# Patient Record
Sex: Male | Born: 1965 | Race: White | Hispanic: No | Marital: Married | Smoking: Never smoker
Health system: Southern US, Community
[De-identification: ages and names within clinical notes are randomized; demographics above are authoritative.]

## PROBLEM LIST (undated history)

## (undated) DIAGNOSIS — J189 Pneumonia, unspecified organism: Secondary | ICD-10-CM

## (undated) DIAGNOSIS — R51 Headache: Secondary | ICD-10-CM

## (undated) DIAGNOSIS — E785 Hyperlipidemia, unspecified: Secondary | ICD-10-CM

## (undated) DIAGNOSIS — R0602 Shortness of breath: Secondary | ICD-10-CM

## (undated) DIAGNOSIS — J302 Other seasonal allergic rhinitis: Secondary | ICD-10-CM

## (undated) DIAGNOSIS — J329 Chronic sinusitis, unspecified: Secondary | ICD-10-CM

## (undated) DIAGNOSIS — I499 Cardiac arrhythmia, unspecified: Secondary | ICD-10-CM

## (undated) DIAGNOSIS — I2510 Atherosclerotic heart disease of native coronary artery without angina pectoris: Secondary | ICD-10-CM

## (undated) DIAGNOSIS — S83206A Unspecified tear of unspecified meniscus, current injury, right knee, initial encounter: Secondary | ICD-10-CM

## (undated) DIAGNOSIS — N200 Calculus of kidney: Secondary | ICD-10-CM

## (undated) DIAGNOSIS — Z9889 Other specified postprocedural states: Secondary | ICD-10-CM

## (undated) DIAGNOSIS — M1990 Unspecified osteoarthritis, unspecified site: Secondary | ICD-10-CM

## (undated) DIAGNOSIS — N189 Chronic kidney disease, unspecified: Secondary | ICD-10-CM

## (undated) DIAGNOSIS — I10 Essential (primary) hypertension: Secondary | ICD-10-CM

## (undated) DIAGNOSIS — Z87442 Personal history of urinary calculi: Secondary | ICD-10-CM

## (undated) DIAGNOSIS — G4733 Obstructive sleep apnea (adult) (pediatric): Secondary | ICD-10-CM

## (undated) DIAGNOSIS — Z8679 Personal history of other diseases of the circulatory system: Secondary | ICD-10-CM

## (undated) DIAGNOSIS — Z7901 Long term (current) use of anticoagulants: Secondary | ICD-10-CM

## (undated) DIAGNOSIS — I4891 Unspecified atrial fibrillation: Secondary | ICD-10-CM

## (undated) HISTORY — PX: EXTRACORPOREAL SHOCK WAVE LITHOTRIPSY: SHX1557

## (undated) HISTORY — DX: Unspecified atrial fibrillation: I48.91

## (undated) HISTORY — DX: Chronic sinusitis, unspecified: J32.9

## (undated) HISTORY — DX: Personal history of other diseases of the circulatory system: Z98.890

## (undated) HISTORY — PX: OTHER SURGICAL HISTORY: SHX169

## (undated) HISTORY — PX: RHINOPLASTY: SUR1284

## (undated) HISTORY — DX: Essential (primary) hypertension: I10

## (undated) HISTORY — DX: Hyperlipidemia, unspecified: E78.5

## (undated) HISTORY — PX: KNEE ARTHROSCOPY: SUR90

## (undated) HISTORY — PX: ROBOT ASSISTED INGUINAL HERNIA REPAIR: SHX6561

## (undated) HISTORY — PX: CARDIOVERSION: SHX1299

## (undated) HISTORY — PX: LITHOTRIPSY: SUR834

## (undated) HISTORY — DX: Headache: R51

---
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
## 2004-02-02 ENCOUNTER — Ambulatory Visit: Payer: Self-pay | Admitting: Specialist

## 2004-04-22 ENCOUNTER — Emergency Department: Payer: Self-pay | Admitting: Internal Medicine

## 2004-04-29 HISTORY — PX: RHINOPLASTY: SUR1284

## 2004-07-02 ENCOUNTER — Ambulatory Visit: Payer: Self-pay | Admitting: Family Medicine

## 2004-07-03 ENCOUNTER — Ambulatory Visit: Payer: Self-pay | Admitting: *Deleted

## 2005-04-26 ENCOUNTER — Ambulatory Visit: Payer: Self-pay | Admitting: Otolaryngology

## 2007-04-30 HISTORY — PX: KNEE SURGERY: SHX244

## 2009-04-29 DIAGNOSIS — I480 Paroxysmal atrial fibrillation: Secondary | ICD-10-CM

## 2009-04-29 HISTORY — DX: Paroxysmal atrial fibrillation: I48.0

## 2009-09-21 ENCOUNTER — Ambulatory Visit: Payer: Self-pay | Admitting: Otolaryngology

## 2011-05-16 ENCOUNTER — Ambulatory Visit: Payer: Self-pay | Admitting: Urology

## 2011-05-27 ENCOUNTER — Ambulatory Visit: Payer: Self-pay | Admitting: Urology

## 2011-05-30 ENCOUNTER — Ambulatory Visit: Payer: Self-pay | Admitting: Urology

## 2011-06-14 ENCOUNTER — Ambulatory Visit: Payer: Self-pay | Admitting: Urology

## 2011-09-18 ENCOUNTER — Ambulatory Visit: Payer: Self-pay | Admitting: Urology

## 2011-10-24 ENCOUNTER — Ambulatory Visit: Payer: Self-pay | Admitting: Urology

## 2011-11-06 ENCOUNTER — Ambulatory Visit: Payer: Self-pay | Admitting: Urology

## 2013-05-20 ENCOUNTER — Ambulatory Visit: Payer: Self-pay | Admitting: Surgery

## 2013-05-28 ENCOUNTER — Ambulatory Visit: Payer: Self-pay | Admitting: Surgery

## 2013-05-28 HISTORY — PX: HERNIA REPAIR: SHX51

## 2013-06-03 ENCOUNTER — Ambulatory Visit (INDEPENDENT_AMBULATORY_CARE_PROVIDER_SITE_OTHER): Payer: BC Managed Care – PPO | Admitting: Internal Medicine

## 2013-06-03 ENCOUNTER — Encounter: Payer: Self-pay | Admitting: Internal Medicine

## 2013-06-03 VITALS — BP 128/88 | HR 89 | Ht 70.0 in | Wt 191.8 lb

## 2013-06-03 DIAGNOSIS — I4891 Unspecified atrial fibrillation: Secondary | ICD-10-CM

## 2013-06-03 MED ORDER — DRONEDARONE HCL 400 MG PO TABS: 400.0000 mg | ORAL_TABLET | Freq: Two times a day (BID) | ORAL | Status: DC

## 2013-06-03 NOTE — Patient Instructions (Signed)
Your physician recommends that you schedule a follow-up appointment in: 3 months with Dr Rayann Heman  Your physician has requested that you have an echocardiogram. Echocardiography is a painless test that uses sound waves to create images of your heart. It provides your doctor with information about the size and shape of your heart and how well your heart's chambers and valves are working. This procedure takes approximately one hour. There are no restrictions for this procedure.  Call Leonia Reader [phone] if you decide to proceed with ablation   Your physician has recommended you make the following change in your medication:  1) Stop Flecainide 2) Sunday Start Multaq 400mg  twice daily with food

## 2013-06-11 ENCOUNTER — Ambulatory Visit (HOSPITAL_COMMUNITY): Payer: BC Managed Care – PPO | Attending: Internal Medicine | Admitting: Cardiology

## 2013-06-11 DIAGNOSIS — I4891 Unspecified atrial fibrillation: Secondary | ICD-10-CM

## 2013-06-11 DIAGNOSIS — I359 Nonrheumatic aortic valve disorder, unspecified: Secondary | ICD-10-CM | POA: Insufficient documentation

## 2013-06-11 DIAGNOSIS — I059 Rheumatic mitral valve disease, unspecified: Secondary | ICD-10-CM | POA: Insufficient documentation

## 2013-06-11 NOTE — Progress Notes (Signed)
Echo performed. 

## 2013-06-13 ENCOUNTER — Encounter: Payer: Self-pay | Admitting: Internal Medicine

## 2013-06-13 DIAGNOSIS — I4891 Unspecified atrial fibrillation: Secondary | ICD-10-CM | POA: Insufficient documentation

## 2013-06-13 NOTE — Progress Notes (Signed)
Primary Care Physician: [name], MD Referring Physician:  Self referred   Jorge [name] is a 48 y.o. male with a h/o paroxsymal atrial fibrillation who presents today for EP consultation.  He reports that he was initially diagnosed with atrial fibrillation in 2011.  He was followed at [facility] by Dr Willis Modena. He underwent cardioversion at that time.  He was placed on metoprolol but did not tolerate this medicine due to SOB and fatigue.  He was subsequently placed on flecainide.  He did very well for several years.  He also reports that with alcohol avoidance that his afib was better controlled.  Recently, he has had increasing frequency and duration of his atrial fibrillation.  Presently, he has afib about once per week, lasting up to 24 hours at a time.  He reprots fatigue and palpitations with this.  Today, he denies symptoms of palpitations, chest pain, shortness of breath, orthopnea, PND, lower extremity edema, dizziness, presyncope, syncope, or neurologic sequela. The patient is tolerating medications without difficulties and is otherwise without complaint today.   Past Medical History  Diagnosis Date  . Atrial fibrillation     Hx of DCCV. Maintained on Flecainide  . Hyperlipidemia   . Hypertension   . Sinusitis   . Headache    Past Surgical History  Procedure Laterality Date  . Cardioversion      Hx of  . Appendectomy  1975  . Rhinoplasty  2006    Dr. Nadeen Landau, [facility]  . Knee surgery  2009    [facility]    Current Outpatient Prescriptions  Medication Sig Dispense Refill  . aspirin 81 MG tablet Take 81 mg by mouth daily.      Marland Kitchen atorvastatin (LIPITOR) 10 MG tablet Take 10 mg by mouth daily.      . cetirizine (ZYRTEC) 10 MG tablet Take 10 mg by mouth as needed for allergies.      . NON FORMULARY Citrical - Take 1 tablet at bedtime      . dronedarone (MULTAQ) 400 MG tablet Take 1 tablet (400 mg total) by mouth 2 (two) times daily with a meal.  60  tablet  11   No current facility-administered medications for this visit.    No Known Allergies  History   Social History  . Marital Status: Married    Spouse Name: N/A    Number of Children: N/A  . Years of Education: N/A   Occupational History  . Not on file.   Social History Main Topics  . Smoking status: Never Smoker   . Smokeless tobacco: Not on file  . Alcohol Use: 3.0 oz/week    6 drink(s) per week  . Drug Use: No  . Sexual Activity: Not on file   Other Topics Concern  . Not on file   Social History Narrative   Lives in [place] with his spouse.  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[employer]    Family History  Problem Relation Age of Onset  . Colon polyps      Fam Hx  . Cancer Mother     breast, cause of death  . Heart attack Maternal Grandfather 53    Cause of death  . Hypertension Maternal Grandfather   . Heart failure Brother   . CVA Paternal Grandmother     cause of death  . Atrial fibrillation Maternal Grandmother     ROS- All systems are reviewed and negative except as per the HPI  above  Physical Exam: Filed Vitals:   06/03/13 1356  BP: 128/88  Pulse: 89  Height: 5\' 10"  (1.778 m)  Weight: 191 lb 12.8 oz (87 kg)    GEN- The patient is well appearing, alert and oriented x 3 today.   Head- normocephalic, atraumatic Eyes-  Sclera clear, conjunctiva pink Ears- hearing intact Oropharynx- clear Neck- supple, no JVP Lymph- no cervical lymphadenopathy Lungs- Clear to ausculation bilaterally, normal work of breathing Heart- Regular rate and rhythm, no murmurs, rubs or gallops, PMI not laterally displaced GI- soft, NT, ND, + BS Extremities- no clubbing, cyanosis, or edema MS- no significant deformity or atrophy Skin- no rash or lesion Psych- euthymic mood, full affect Neuro- strength and sensation are intact  EKG today reveals sinus rhthm 89 bpm, PR 212, septal infarct, QTc 455 More than 10 pages of records from [facility] are  reviewed  Assessment and Plan:  1. Paroxysmal atrial fibrillation The patient has symptomatic recurrent afib.  He has failed medical therapy with metoprolol and flecainide. Therapeutic strategies for afib including medicine and ablation were discussed in detail with the patient today. Risk, benefits, and alternatives to EP study and radiofrequency ablation for afib were also discussed in detail today. These risks include but are not limited to stroke, bleeding, vascular damage, tamponade, perforation, damage to the esophagus, lungs, and other structures, pulmonary vein stenosis, worsening renal function, and death. The patient understands these risk and wishes to further contemplate this option.  In the interim, he would like to try a different AAD.  Multaq, propafenone, and tikosyn were presented as options.  He would like to try multaq.  Risks, benefits, and alternatives to this medicine were discussed with the patient.  He is encouraged to take it with food. We will stop flecainide and start multaq after a 72 hour washout. He will contact our office if he decides to proceed with ablation.  We will obtain a 2D echo to evaluate for structural changes related to afib. chads2vasc is 0.  He is not on anticoagulation presently and would need at least 3 weeks of xarelto prior to ablation.  He will return for follow-up in 3 months

## 2013-06-16 ENCOUNTER — Telehealth: Payer: Self-pay | Admitting: *Deleted

## 2013-06-16 NOTE — Telephone Encounter (Signed)
I spoke with pt wife about ECHO results She will have him call back about follow-up 3 month appointment in May with Dr. Rayann Heman States she thinks that the atrial fibrillation is bothering him & may want to go ahead with the ablation but, he will give the new medicine more of a chance. Horton Chin RN

## 2013-09-14 ENCOUNTER — Encounter: Payer: Self-pay | Admitting: Internal Medicine

## 2013-09-16 ENCOUNTER — Encounter (INDEPENDENT_AMBULATORY_CARE_PROVIDER_SITE_OTHER): Payer: Self-pay

## 2013-09-16 ENCOUNTER — Encounter: Payer: Self-pay | Admitting: Internal Medicine

## 2013-09-16 ENCOUNTER — Ambulatory Visit (INDEPENDENT_AMBULATORY_CARE_PROVIDER_SITE_OTHER): Payer: BC Managed Care – PPO | Admitting: Internal Medicine

## 2013-09-16 VITALS — BP 137/88 | HR 94 | Ht 70.5 in | Wt 185.0 lb

## 2013-09-16 DIAGNOSIS — I4891 Unspecified atrial fibrillation: Secondary | ICD-10-CM

## 2013-09-16 NOTE — Patient Instructions (Signed)
Your physician recommends that you schedule a follow-up appointment on Tues 09/21/13 for EKG  If still out of rhythm will need Xarelto 20mg  daily and set up for Cardioversion in 3 weeks   Your physician has recommended that you have an ablation. Catheter ablation is a medical procedure used to treat some cardiac arrhythmias (irregular heartbeats). During catheter ablation, a long, thin, flexible tube is put into a blood vessel in your groin (upper thigh), or neck. This tube is called an ablation catheter. It is then guided to your heart through the blood vessel. Radio frequency waves destroy small areas of heart tissue where abnormal heartbeats may cause an arrhythmia to start. Please see the instruction sheet given to you today.----will set up for 12/09/13

## 2013-09-21 NOTE — Progress Notes (Signed)
Primary Care Physician: GILBERT,RICHARD L, MD   Jorge [name] is a 48 y.o. male with a h/o paroxsymal atrial fibrillation who presents today for EP follow-up.  He reports that he was initially diagnosed with atrial fibrillation in 2011.  He was followed at [facility] by Dr Gehi. He underwent cardioversion at that time.  He was placed on metoprolol but did not tolerate this medicine due to SOB and fatigue.  He was subsequently placed on flecainide.  He did very well for several years.  He also reports that with alcohol avoidance that his afib was better controlled.  Recently, he has had increasing frequency and duration of his atrial fibrillation.   When I saw him last, we switched flecainide to multaq.  He thinks that his afib has been somewhat better.  He is actually in afib today but mostly unaware.  He reports increased fatigue recently. Today, he denies symptoms of palpitations, chest pain, shortness of breath, orthopnea, PND, lower extremity edema, dizziness, presyncope, syncope, or neurologic sequela. The patient is tolerating medications without difficulties and is otherwise without complaint today.   Past Medical History  Diagnosis Date  . Atrial fibrillation     Hx of DCCV. Maintained on Flecainide  . Hyperlipidemia   . Hypertension   . Sinusitis   . Headache    Past Surgical History  Procedure Laterality Date  . Cardioversion      Hx of  . Appendectomy  1975  . Rhinoplasty  2006    Dr. Madison Clark, [facility]  . Knee surgery  2009    [facility]    Current Outpatient Prescriptions  Medication Sig Dispense Refill  . atorvastatin (LIPITOR) 10 MG tablet Take 10 mg by mouth daily.      . cetirizine (ZYRTEC) 10 MG tablet Take 10 mg by mouth as needed for allergies.      . dronedarone (MULTAQ) 400 MG tablet Take 1 tablet (400 mg total) by mouth 2 (two) times daily with a meal.  60 tablet  11  . nabumetone (RELAFEN) 500 MG tablet Take 1,000 mg by mouth daily.       . NON FORMULARY Citrical - Take 1 tablet at bedtime       No current facility-administered medications for this visit.    No Known Allergies  History   Social History  . Marital Status: Married    Spouse Name: N/A    Number of Children: N/A  . Years of Education: N/A   Occupational History  . Not on file.   Social History Main Topics  . Smoking status: Never Smoker   . Smokeless tobacco: Not on file  . Alcohol Use: 3.0 oz/week    6 drink(s) per week  . Drug Use: No  . Sexual Activity: Not on file   Other Topics Concern  . Not on file   Social History Narrative   Lives in [place] with his spouse.  Works as Lieutenant Colonel of the [employer]    Family History  Problem Relation Age of Onset  . Colon polyps      Fam Hx  . Cancer Mother     breast, cause of death  . Heart attack Maternal Grandfather 53    Cause of death  . Hypertension Maternal Grandfather   . Heart failure Brother   . CVA Paternal Grandmother     cause of death  . Atrial fibrillation Maternal Grandmother     ROS- All systems   are reviewed and negative except as per the HPI above  Physical Exam: Filed Vitals:   09/16/13 0948  BP: 137/88  Pulse: 94  Height: 5' 10.5" (1.791 m)  Weight: 185 lb (83.915 kg)    GEN- The patient is well appearing, alert and oriented x 3 today.   Head- normocephalic, atraumatic Eyes-  Sclera clear, conjunctiva pink Ears- hearing intact Oropharynx- clear Neck- supple, no JVP Lymph- no cervical lymphadenopathy Lungs- Clear to ausculation bilaterally, normal work of breathing Heart- irregular rate and rhythm, no murmurs, rubs or gallops, PMI not laterally displaced GI- soft, NT, ND, + BS Extremities- no clubbing, cyanosis, or edema MS- no significant deformity or atrophy Skin- no rash or lesion Psych- euthymic mood, full affect Neuro- strength and sensation are intact  EKG today reveals afib, V rate 94 bpm,   Assessment and Plan:  1.  Paroxysmal atrial fibrillation The patient has symptomatic recurrent afib.  He has failed medical therapy with metoprolol and flecainide.  He is in afib today despite multaq. Therapeutic strategies for afib including medicine and ablation were discussed in detail with the patient today. Risk, benefits, and alternatives to EP study and radiofrequency ablation for afib were also discussed in detail today. These risks include but are not limited to stroke, bleeding, vascular damage, tamponade, perforation, damage to the esophagus, lungs, and other structures, pulmonary vein stenosis, worsening renal function, and death. The patient understands these risk and wishes to proceed.  He wants to wait until August for the procedure. I will have him return on Tuesday.  If he remains in afib then we will initiate xarelto 20mg daily and then proceed with cardioversion in 3 weeks. He will contact me if problems arise. 

## 2013-09-22 ENCOUNTER — Ambulatory Visit (INDEPENDENT_AMBULATORY_CARE_PROVIDER_SITE_OTHER): Payer: BC Managed Care – PPO | Admitting: *Deleted

## 2013-09-22 DIAGNOSIS — I4891 Unspecified atrial fibrillation: Secondary | ICD-10-CM

## 2013-09-22 MED ORDER — RIVAROXABAN 20 MG PO TABS: 20.0000 mg | ORAL_TABLET | Freq: Every day | ORAL | Status: DC

## 2013-09-22 MED ORDER — FLECAINIDE ACETATE 100 MG PO TABS: 100.0000 mg | ORAL_TABLET | Freq: Two times a day (BID) | ORAL | Status: DC

## 2013-09-22 NOTE — Patient Instructions (Signed)
Your physician recommends that you schedule a follow-up appointment in: 10/08/13 at [facility] for EKG  Your physician has recommended you make the following change in your medication:  1) Stop Multaq 2) Start Flecainide 100mg  twice daily on Sat 3) Start Xarelto 20mg  daily

## 2013-09-24 ENCOUNTER — Encounter: Payer: Self-pay | Admitting: *Deleted

## 2013-09-24 ENCOUNTER — Other Ambulatory Visit: Payer: Self-pay | Admitting: *Deleted

## 2013-09-24 DIAGNOSIS — I4891 Unspecified atrial fibrillation: Secondary | ICD-10-CM

## 2013-09-24 NOTE — Progress Notes (Signed)
Will set up for DCCV 1.) Reason for visit: EKG   2.) Name of MD requesting visit: Dr Thompson Grayer  3.) H&P Here today for EKG  Will change medications and repeat on 6/12.  If still out of rhythm will set for DCCV on 6/19  4.) ROS related to problem: afib  5.) Assessment and plan per MD:  Per Dr Rayann Heman   Your physician recommends that you schedule a follow-up appointment in: 10/08/13 at [place] for EKG  Your physician has recommended you make the following change in your medication:  1) Stop Multaq 2) Start Flecainide 100mg  twice daily on Sat 3) Start Xarelto 20mg  daily

## 2013-10-04 ENCOUNTER — Encounter (HOSPITAL_COMMUNITY): Payer: Self-pay | Admitting: Pharmacy Technician

## 2013-10-05 ENCOUNTER — Telehealth: Payer: Self-pay

## 2013-10-05 NOTE — Telephone Encounter (Signed)
pt called to confirm date and time for lab and nurse ekg.pt made aware of appt 6/12 @ 8:10 and 9am pt aware and verbalized understanding.

## 2013-10-08 ENCOUNTER — Ambulatory Visit (INDEPENDENT_AMBULATORY_CARE_PROVIDER_SITE_OTHER): Payer: BC Managed Care – PPO | Admitting: *Deleted

## 2013-10-08 ENCOUNTER — Other Ambulatory Visit: Payer: Self-pay | Admitting: *Deleted

## 2013-10-08 ENCOUNTER — Encounter: Payer: Self-pay | Admitting: *Deleted

## 2013-10-08 ENCOUNTER — Other Ambulatory Visit (INDEPENDENT_AMBULATORY_CARE_PROVIDER_SITE_OTHER): Payer: BC Managed Care – PPO

## 2013-10-08 VITALS — BP 127/82 | HR 136 | Ht 70.0 in | Wt 180.0 lb

## 2013-10-08 DIAGNOSIS — I4891 Unspecified atrial fibrillation: Secondary | ICD-10-CM

## 2013-10-08 LAB — CBC WITH DIFFERENTIAL/PLATELET
BASOS PCT: 0.5 % (ref 0.0–3.0)
Basophils Absolute: 0 10*3/uL (ref 0.0–0.1)
EOS PCT: 3 % (ref 0.0–5.0)
Eosinophils Absolute: 0.2 10*3/uL (ref 0.0–0.7)
HCT: 41.5 % (ref 39.0–52.0)
HEMOGLOBIN: 14.3 g/dL (ref 13.0–17.0)
Lymphocytes Relative: 25.6 % (ref 12.0–46.0)
Lymphs Abs: 1.9 10*3/uL (ref 0.7–4.0)
MCHC: 34.4 g/dL (ref 30.0–36.0)
MCV: 90.3 fl (ref 78.0–100.0)
Monocytes Absolute: 0.6 10*3/uL (ref 0.1–1.0)
Monocytes Relative: 7.9 % (ref 3.0–12.0)
NEUTROS ABS: 4.8 10*3/uL (ref 1.4–7.7)
NEUTROS PCT: 63 % (ref 43.0–77.0)
Platelets: 242 10*3/uL (ref 150.0–400.0)
RBC: 4.6 Mil/uL (ref 4.22–5.81)
RDW: 13.5 % (ref 11.5–15.5)
WBC: 7.5 10*3/uL (ref 4.0–10.5)

## 2013-10-08 LAB — BASIC METABOLIC PANEL
BUN: 13 mg/dL (ref 6–23)
CO2: 27 mEq/L (ref 19–32)
Calcium: 9.6 mg/dL (ref 8.4–10.5)
Chloride: 104 mEq/L (ref 96–112)
Creatinine, Ser: 1 mg/dL (ref 0.4–1.5)
GFR: 89.88 mL/min (ref >60.00)
GLUCOSE: 86 mg/dL (ref 70–99)
Potassium: 3.9 mEq/L (ref 3.5–5.1)
Sodium: 138 mEq/L (ref 135–145)

## 2013-10-08 MED ORDER — DILTIAZEM HCL ER COATED BEADS 240 MG PO CP24: 240.0000 mg | ORAL_CAPSULE | Freq: Every day | ORAL | Status: DC

## 2013-10-08 NOTE — Progress Notes (Signed)
1.) Reason for visit: labs and EKG prior to DCCV scheduled for 10/15/13 with Dr Harrington Challenger  2.) Name of MD requesting visit: Dr Thompson Grayer  3.) H&P: here for EKG and labs prior to DCCV   4.) ROS related to problem: no complaints at present.  EKG done shows AF with HR of 136  5.) Assessment and plan per MD:

## 2013-10-08 NOTE — Patient Instructions (Addendum)
    Your physician has recommended that you have a Cardioversion (DCCV). Electrical Cardioversion uses a jolt of electricity to your heart either through paddles or wired patches attached to your chest. This is a controlled, usually prescheduled, procedure. Defibrillation is done under light anesthesia in the hospital, and you usually go home the day of the procedure. This is done to get your heart back into a normal rhythm. You are not awake for the procedure. Please see the instruction sheet given to you today.  Your physician has recommended you make the following change in your medication:  1) Start Cardizem 240mg  daily   HOLD 24hours prior to DCCV

## 2013-10-15 ENCOUNTER — Encounter (HOSPITAL_COMMUNITY)
Admission: RE | Disposition: A | Discharge status: Home / Self Care | Payer: Self-pay | Source: Ambulatory Visit | Attending: Internal Medicine

## 2013-10-15 ENCOUNTER — Encounter (HOSPITAL_COMMUNITY): Payer: Self-pay | Admitting: *Deleted

## 2013-10-15 ENCOUNTER — Ambulatory Visit (HOSPITAL_COMMUNITY): Payer: BC Managed Care – PPO | Admitting: Certified Registered"

## 2013-10-15 ENCOUNTER — Encounter (HOSPITAL_COMMUNITY): Payer: BC Managed Care – PPO | Admitting: Certified Registered"

## 2013-10-15 ENCOUNTER — Ambulatory Visit (HOSPITAL_COMMUNITY)
Admission: RE | Admit: 2013-10-15 | Discharge: 2013-10-15 | Disposition: A | Discharge status: Home / Self Care | Payer: BC Managed Care – PPO | Source: Ambulatory Visit | Attending: Internal Medicine | Admitting: Internal Medicine

## 2013-10-15 DIAGNOSIS — E785 Hyperlipidemia, unspecified: Secondary | ICD-10-CM | POA: Insufficient documentation

## 2013-10-15 DIAGNOSIS — I10 Essential (primary) hypertension: Secondary | ICD-10-CM | POA: Insufficient documentation

## 2013-10-15 DIAGNOSIS — J329 Chronic sinusitis, unspecified: Secondary | ICD-10-CM | POA: Insufficient documentation

## 2013-10-15 DIAGNOSIS — I4891 Unspecified atrial fibrillation: Secondary | ICD-10-CM | POA: Insufficient documentation

## 2013-10-15 HISTORY — PX: CARDIOVERSION: SHX1299

## 2013-10-15 SURGERY — CARDIOVERSION
Anesthesia: Monitor Anesthesia Care

## 2013-10-15 MED ORDER — SODIUM CHLORIDE 0.9 % IV SOLN
INTRAVENOUS | Status: DC | PRN
  Administered 2013-10-15: 11:00:00 via INTRAVENOUS

## 2013-10-15 MED ORDER — SODIUM CHLORIDE 0.9 % IV SOLN
INTRAVENOUS | Status: DC
  Administered 2013-10-15: 11:00:00 via INTRAVENOUS

## 2013-10-15 MED ORDER — PROPOFOL 10 MG/ML IV BOLUS
INTRAVENOUS | Status: DC | PRN
  Administered 2013-10-15: 85 mg via INTRAVENOUS

## 2013-10-15 MED ORDER — LIDOCAINE HCL (CARDIAC) 20 MG/ML IV SOLN
INTRAVENOUS | Status: DC | PRN
  Administered 2013-10-15: 40 mg via INTRAVENOUS

## 2013-10-15 MED ORDER — PROPOFOL 10 MG/ML IV BOLUS
INTRAVENOUS | Status: AC
  Filled 2013-10-15: qty 20

## 2013-10-15 MED ORDER — LIDOCAINE HCL (CARDIAC) 20 MG/ML IV SOLN
INTRAVENOUS | Status: AC
  Filled 2013-10-15: qty 5

## 2013-10-15 NOTE — Anesthesia Preprocedure Evaluation (Addendum)
Anesthesia Evaluation  Patient identified by MRN, date of birth, ID band Patient awake    Reviewed: Allergy & Precautions, H&P , NPO status , Patient's Chart, lab work & pertinent test results  Airway Mallampati: I TM Distance: >3 FB Neck ROM: Full    Dental  (+) Teeth Intact, Dental Advisory Given   Pulmonary          Cardiovascular hypertension,     Neuro/Psych    GI/Hepatic   Endo/Other    Renal/GU      Musculoskeletal   Abdominal   Peds  Hematology   Anesthesia Other Findings   Reproductive/Obstetrics                       Anesthesia Physical Anesthesia Plan  ASA: II  Anesthesia Plan: General   Post-op Pain Management:    Induction: Intravenous  Airway Management Planned: Mask  Additional Equipment:   Intra-op Plan:   Post-operative Plan:   Informed Consent: I have reviewed the patients History and Physical, chart, labs and discussed the procedure including the risks, benefits and alternatives for the proposed anesthesia with the patient or authorized representative who has indicated his/her understanding and acceptance.     Plan Discussed with:   Anesthesia Plan Comments:       Anesthesia Quick Evaluation

## 2013-10-15 NOTE — Discharge Instructions (Signed)
Electrical Cardioversion Electrical cardioversion is the delivery of a jolt of electricity to change the rhythm of the heart. Sticky patches or metal paddles are placed on the chest to deliver the electricity from a device. This is done to restore a normal rhythm. A rhythm that is too fast or not regular keeps the heart from pumping well. Electrical cardioversion is done in an emergency if:   There is low or no blood pressure as a result of the heart rhythm.   Normal rhythm must be restored as fast as possible to protect the brain and heart from further damage.   It may save a life. Cardioversion may be done for heart rhythms that are not immediately life-threatening, such as atrial fibrillation or flutter, in which:   The heart is beating too fast or is not regular.   Medicine to change the rhythm has not worked.   It is safe to wait in order to allow time for preparation.  Symptoms of the abnormal rhythm are bothersome.  The risk of stroke and other serious complications can be reduced. LET YOUR CAREGIVER KNOW ABOUT:   All medicines you are taking, including vitamins, herbs, eye drops, creams, and over-the-counter medicines.   Previous problems you or members of your family have had with the use of anesthetics.   Any blood disorders you have.   Previous surgeries you have had.   Medical conditions you have. RISKS AND COMPLICATIONS  Generally, this is a safe procedure. However, as with any procedure, complications can occur. Possible complications include:   Breathing problems related to the anesthetic used.  Cardiac arrest--This risk is rare.  A blood clot that breaks free and travels to other parts of your body. This could cause a stroke or other problems. The risk of this is lowered by use of blood thinning medicine (anticoagulant) prior to the procedure. BEFORE THE PROCEDURE   You may have tests to detect blood clots in your heart and evaluate heart  function.  You may start taking anticoagulants so your blood does not clot as easily.   Medicines may be given to help stabilize your heart rate and rhythm. PROCEDURE  You will be given medicine through an IV tube to reduce discomfort and make you sleepy (sedative).   An electrical shock will be delivered. AFTER THE PROCEDURE Your heart rhythm will be watched to make sure it does not change.You may be able to go home within a few hours.  Document Released: 04/05/2002 Document Revised: 02/03/2013 Document Reviewed: 10/28/2012 [facility] Patient Information 2015 [place], [place]. This information is not intended to replace advice given to you by your health care provider. Make sure you discuss any questions you have with your health care provider.

## 2013-10-15 NOTE — Op Note (Signed)
Patient sedated by anesthesia with lidocaine and propofol With pads in AP position patient cardioverted to SR with 150 J synchronized biphasic energy.

## 2013-10-15 NOTE — Interval H&P Note (Signed)
History and Physical Interval Note:  10/15/2013 8:56 AM  Jorge [name]  has presented today for surgery, with the diagnosis of AFIB  The various methods of treatment have been discussed with the patient and family. After consideration of risks, benefits and other options for treatment, the patient has consented to  Procedure(s): CARDIOVERSION (N/A) as a surgical intervention .  The patient's history has been reviewed, patient examined, no change in status, stable for surgery.  I have reviewed the patient's chart and labs.  Questions were answered to the patient's satisfaction.     Dorris Carnes

## 2013-10-15 NOTE — Transfer of Care (Signed)
Immediate Anesthesia Transfer of Care Note  Patient: Jorge [name]  Procedure(s) Performed: Procedure(s): CARDIOVERSION (N/A)  Patient Location: Endoscopy Unit  Anesthesia Type:MAC  Level of Consciousness: awake, alert  and oriented  Airway & Oxygen Therapy: Patient Spontanous Breathing  Post-op Assessment: Report given to PACU RN  Post vital signs: Reviewed and stable  Complications: No apparent anesthesia complications

## 2013-10-15 NOTE — H&P (View-Only) (Signed)
Primary Care Physician: [name], MD   [place] is a 48 y.o. male with a h/o paroxsymal atrial fibrillation who presents today for EP follow-up.  He reports that he was initially diagnosed with atrial fibrillation in 2011.  He was followed at [facility] by Dr Willis Modena. He underwent cardioversion at that time.  He was placed on metoprolol but did not tolerate this medicine due to SOB and fatigue.  He was subsequently placed on flecainide.  He did very well for several years.  He also reports that with alcohol avoidance that his afib was better controlled.  Recently, he has had increasing frequency and duration of his atrial fibrillation.   When I saw him last, we switched flecainide to multaq.  He thinks that his afib has been somewhat better.  He is actually in afib today but mostly unaware.  He reports increased fatigue recently. Today, he denies symptoms of palpitations, chest pain, shortness of breath, orthopnea, PND, lower extremity edema, dizziness, presyncope, syncope, or neurologic sequela. The patient is tolerating medications without difficulties and is otherwise without complaint today.   Past Medical History  Diagnosis Date  . Atrial fibrillation     Hx of DCCV. Maintained on Flecainide  . Hyperlipidemia   . Hypertension   . Sinusitis   . Headache    Past Surgical History  Procedure Laterality Date  . Cardioversion      Hx of  . Appendectomy  1975  . Rhinoplasty  2006    Dr. Nadeen Landau, [facility]  . Knee surgery  2009    [facility]    Current Outpatient Prescriptions  Medication Sig Dispense Refill  . atorvastatin (LIPITOR) 10 MG tablet Take 10 mg by mouth daily.      . cetirizine (ZYRTEC) 10 MG tablet Take 10 mg by mouth as needed for allergies.      Marland Kitchen dronedarone (MULTAQ) 400 MG tablet Take 1 tablet (400 mg total) by mouth 2 (two) times daily with a meal.  60 tablet  11  . nabumetone (RELAFEN) 500 MG tablet Take 1,000 mg by mouth daily.       . NON FORMULARY Citrical - Take 1 tablet at bedtime       No current facility-administered medications for this visit.    No Known Allergies  History   Social History  . Marital Status: Married    Spouse Name: N/A    Number of Children: N/A  . Years of Education: N/A   Occupational History  . Not on file.   Social History Main Topics  . Smoking status: Never Smoker   . Smokeless tobacco: Not on file  . Alcohol Use: 3.0 oz/week    6 drink(s) per week  . Drug Use: No  . Sexual Activity: Not on file   Other Topics Concern  . Not on file   Social History Narrative   Lives in [place] with his spouse.  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[employer]    Family History  Problem Relation Age of Onset  . Colon polyps      Fam Hx  . Cancer Mother     breast, cause of death  . Heart attack Maternal Grandfather 53    Cause of death  . Hypertension Maternal Grandfather   . Heart failure Brother   . CVA Paternal Grandmother     cause of death  . Atrial fibrillation Maternal Grandmother     ROS- All systems  are reviewed and negative except as per the HPI above  Physical Exam: Filed Vitals:   09/16/13 0948  BP: 137/88  Pulse: 94  Height: 5' 10.5" (1.791 m)  Weight: 185 lb (83.915 kg)    GEN- The patient is well appearing, alert and oriented x 3 today.   Head- normocephalic, atraumatic Eyes-  Sclera clear, conjunctiva pink Ears- hearing intact Oropharynx- clear Neck- supple, no JVP Lymph- no cervical lymphadenopathy Lungs- Clear to ausculation bilaterally, normal work of breathing Heart- irregular rate and rhythm, no murmurs, rubs or gallops, PMI not laterally displaced GI- soft, NT, ND, + BS Extremities- no clubbing, cyanosis, or edema MS- no significant deformity or atrophy Skin- no rash or lesion Psych- euthymic mood, full affect Neuro- strength and sensation are intact  EKG today reveals afib, V rate 94 bpm,   Assessment and Plan:  1.  Paroxysmal atrial fibrillation The patient has symptomatic recurrent afib.  He has failed medical therapy with metoprolol and flecainide.  He is in afib today despite multaq. Therapeutic strategies for afib including medicine and ablation were discussed in detail with the patient today. Risk, benefits, and alternatives to EP study and radiofrequency ablation for afib were also discussed in detail today. These risks include but are not limited to stroke, bleeding, vascular damage, tamponade, perforation, damage to the esophagus, lungs, and other structures, pulmonary vein stenosis, worsening renal function, and death. The patient understands these risk and wishes to proceed.  He wants to wait until August for the procedure. I will have him return on Tuesday.  If he remains in afib then we will initiate xarelto 20mg  daily and then proceed with cardioversion in 3 weeks. He will contact me if problems arise.

## 2013-10-15 NOTE — Anesthesia Postprocedure Evaluation (Signed)
  Anesthesia Post-op Note  Patient: Jorge [name]  Procedure(s) Performed: Procedure(s): CARDIOVERSION (N/A)  Patient Location: PACU  Anesthesia Type:General  Level of Consciousness: awake, alert , oriented and patient cooperative  Airway and Oxygen Therapy: Patient Spontanous Breathing  Post-op Pain: none  Post-op Assessment: Post-op Vital signs reviewed, Patient's Cardiovascular Status Stable, Respiratory Function Stable, Patent Airway and No signs of Nausea or vomiting  Post-op Vital Signs: stable  Last Vitals:  Filed Vitals:   10/15/13 1210  BP: 124/87  Pulse: 83  Temp:   Resp: 18    Complications: No apparent anesthesia complications

## 2013-10-18 ENCOUNTER — Encounter (HOSPITAL_COMMUNITY): Payer: Self-pay | Admitting: Internal Medicine

## 2013-10-25 ENCOUNTER — Encounter (HOSPITAL_COMMUNITY): Payer: Self-pay | Admitting: Pharmacy Technician

## 2013-10-26 ENCOUNTER — Other Ambulatory Visit (INDEPENDENT_AMBULATORY_CARE_PROVIDER_SITE_OTHER): Payer: BC Managed Care – PPO

## 2013-10-26 ENCOUNTER — Other Ambulatory Visit: Payer: Self-pay | Admitting: *Deleted

## 2013-10-26 DIAGNOSIS — I4891 Unspecified atrial fibrillation: Secondary | ICD-10-CM

## 2013-10-26 LAB — CBC WITH DIFFERENTIAL/PLATELET
Basophils Absolute: 0 10*3/uL (ref 0.0–0.1)
Basophils Relative: 0.5 % (ref 0.0–3.0)
Eosinophils Absolute: 0.2 10*3/uL (ref 0.0–0.7)
Eosinophils Relative: 3.6 % (ref 0.0–5.0)
HCT: 41.2 % (ref 39.0–52.0)
Hemoglobin: 14 g/dL (ref 13.0–17.0)
Lymphocytes Relative: 25 % (ref 12.0–46.0)
Lymphs Abs: 1.6 10*3/uL (ref 0.7–4.0)
MCHC: 34.1 g/dL (ref 30.0–36.0)
MCV: 91.2 fl (ref 78.0–100.0)
Monocytes Absolute: 0.5 10*3/uL (ref 0.1–1.0)
Monocytes Relative: 8.4 % (ref 3.0–12.0)
Neutro Abs: 3.9 10*3/uL (ref 1.4–7.7)
Neutrophils Relative %: 62.5 % (ref 43.0–77.0)
Platelets: 251 10*3/uL (ref 150.0–400.0)
RBC: 4.52 Mil/uL (ref 4.22–5.81)
RDW: 13 % (ref 11.5–15.5)
WBC: 6.3 10*3/uL (ref 4.0–10.5)

## 2013-10-26 LAB — BASIC METABOLIC PANEL
BUN: 11 mg/dL (ref 6–23)
CHLORIDE: 103 meq/L (ref 96–112)
CO2: 27 mEq/L (ref 19–32)
CREATININE: 0.9 mg/dL (ref 0.4–1.5)
Calcium: 9.5 mg/dL (ref 8.4–10.5)
GFR: 102.16 mL/min (ref >60.00)
Glucose, Bld: 110 mg/dL — ABNORMAL HIGH (ref 70–99)
POTASSIUM: 3.6 meq/L (ref 3.5–5.1)
Sodium: 138 mEq/L (ref 135–145)

## 2013-10-26 MED ORDER — AMIODARONE HCL 200 MG PO TABS: 200.0000 mg | ORAL_TABLET | Freq: Two times a day (BID) | ORAL | Status: DC

## 2013-10-26 NOTE — Telephone Encounter (Addendum)
Patient here today for an EKG and labs s/p DCCV on 6/19 and scheduled for ablation 7/8.  He is in afib today HR 99.  Discussed with Dr Rayann Heman, will stop Flecainide and in 48 hours start Amiodarone 200mg  twice daily.  Discussed with patient risks and benefits of Amiodarone and he agrees with plan.  Will proceed with ablation on 11/03/13

## 2013-11-01 ENCOUNTER — Ambulatory Visit (HOSPITAL_BASED_OUTPATIENT_CLINIC_OR_DEPARTMENT_OTHER)
Admission: RE | Admit: 2013-11-01 | Discharge: 2013-11-01 | Disposition: A | Discharge status: Home / Self Care | Payer: BC Managed Care – PPO | Source: Ambulatory Visit | Attending: Cardiology | Admitting: Cardiology

## 2013-11-01 ENCOUNTER — Encounter (HOSPITAL_COMMUNITY): Payer: Self-pay | Admitting: *Deleted

## 2013-11-01 ENCOUNTER — Encounter (HOSPITAL_COMMUNITY)
Admission: RE | Disposition: A | Discharge status: Home / Self Care | Payer: Self-pay | Source: Ambulatory Visit | Attending: Cardiology

## 2013-11-01 DIAGNOSIS — J329 Chronic sinusitis, unspecified: Secondary | ICD-10-CM

## 2013-11-01 DIAGNOSIS — E785 Hyperlipidemia, unspecified: Secondary | ICD-10-CM

## 2013-11-01 DIAGNOSIS — Z79899 Other long term (current) drug therapy: Secondary | ICD-10-CM | POA: Insufficient documentation

## 2013-11-01 DIAGNOSIS — I10 Essential (primary) hypertension: Secondary | ICD-10-CM | POA: Insufficient documentation

## 2013-11-01 DIAGNOSIS — Z791 Long term (current) use of non-steroidal anti-inflammatories (NSAID): Secondary | ICD-10-CM

## 2013-11-01 DIAGNOSIS — Z7901 Long term (current) use of anticoagulants: Secondary | ICD-10-CM | POA: Insufficient documentation

## 2013-11-01 DIAGNOSIS — I480 Paroxysmal atrial fibrillation: Secondary | ICD-10-CM

## 2013-11-01 DIAGNOSIS — I4891 Unspecified atrial fibrillation: Secondary | ICD-10-CM

## 2013-11-01 HISTORY — PX: TEE WITHOUT CARDIOVERSION: SHX5443

## 2013-11-01 SURGERY — ECHOCARDIOGRAM, TRANSESOPHAGEAL
Anesthesia: Moderate Sedation

## 2013-11-01 MED ORDER — LIDOCAINE VISCOUS 2 % MT SOLN
OROMUCOSAL | Status: AC
  Filled 2013-11-01: qty 15

## 2013-11-01 MED ORDER — FENTANYL CITRATE 0.05 MG/ML IJ SOLN
INTRAMUSCULAR | Status: DC | PRN
  Administered 2013-11-01 (×3): 25 ug via INTRAVENOUS

## 2013-11-01 MED ORDER — MIDAZOLAM HCL 5 MG/ML IJ SOLN
INTRAMUSCULAR | Status: AC
  Filled 2013-11-01: qty 2

## 2013-11-01 MED ORDER — MIDAZOLAM HCL 10 MG/2ML IJ SOLN
INTRAMUSCULAR | Status: DC | PRN
Start: 2013-11-01 — End: 2013-11-01
  Administered 2013-11-01: 2 mg via INTRAVENOUS
  Administered 2013-11-01 (×4): 1 mg via INTRAVENOUS
  Administered 2013-11-01: 2 mg via INTRAVENOUS

## 2013-11-01 MED ORDER — DIPHENHYDRAMINE HCL 50 MG/ML IJ SOLN
INTRAMUSCULAR | Status: AC
  Filled 2013-11-01: qty 1

## 2013-11-01 MED ORDER — FENTANYL CITRATE 0.05 MG/ML IJ SOLN
INTRAMUSCULAR | Status: AC
  Filled 2013-11-01: qty 2

## 2013-11-01 MED ORDER — BUTAMBEN-TETRACAINE-BENZOCAINE 2-2-14 % EX AERO
INHALATION_SPRAY | CUTANEOUS | Status: DC | PRN
  Administered 2013-11-01: 2 via TOPICAL

## 2013-11-01 MED ORDER — SODIUM CHLORIDE 0.9 % IV SOLN
INTRAVENOUS | Status: DC
  Administered 2013-11-01: 500 mL via INTRAVENOUS

## 2013-11-01 NOTE — Discharge Instructions (Signed)
Transesophageal Echocardiogram °Transesophageal echocardiography (TEE) is a picture test of your heart using sound waves. The pictures taken can give very detailed pictures of your heart. This can help your doctor see if there are problems with your heart. TEE can check: °· If your heart has blood clots in it. °· How well your heart valves are working. °· If you have an infection on the inside of your heart. °· Some of the major arteries of your heart. °· If your heart valve is working after a repair. °· Your heart before a procedure that uses a shock to your heart to get the rhythm back to normal. °BEFORE THE PROCEDURE °· Do not eat or drink for 6 hours before the procedure or as told by your doctor. °· Make plans to have someone drive you home after the procedure. Do not drive yourself home. °· An IV tube will be put in your arm. °PROCEDURE °· You will be given a medicine to help you relax (sedative). It will be given through the IV tube. °· A numbing medicine will be sprayed or gargled in the back of your throat to help numb it. °· The tip of the probe is placed into the back of your mouth. You will be asked to swallow. This helps to pass the probe into your esophagus. °· Once the tip of the probe is in the right place, your doctor can take pictures of your heart. °· You may feel pressure at the back of your throat. °AFTER THE PROCEDURE °· You will be taken to a recovery area so the sedative can wear off. °· Your throat may be sore and scratchy. This will go away slowly over time. °· You will go home when you are fully awake and able to swallow liquids. °· You should have someone stay with you for the next 24 hours. °· Do not drive or operate machinery for the next 24 hours. °Document Released: 02/10/2009 Document Revised: 04/20/2013 Document Reviewed: 10/15/2012 °ExitCare® Patient Information ©2015 [employer]. This information is not intended to replace advice given to you by your health care provider. Make  sure you discuss any questions you have with your health care provider. ° ° °Conscious Sedation, Adult, Care After °Refer to this sheet in the next few weeks. These instructions provide you with information on caring for yourself after your procedure. Your health care provider may also give you more specific instructions. Your treatment has been planned according to current medical practices, but problems sometimes occur. Call your health care provider if you have any problems or questions after your procedure. °WHAT TO EXPECT AFTER THE PROCEDURE  °After your procedure: °· You may feel sleepy, clumsy, and have poor balance for several hours. °· Vomiting may occur if you eat too soon after the procedure. °HOME CARE INSTRUCTIONS °· Do not participate in any activities where you could become injured for at least 24 hours. Do not: °¨ Drive. °¨ Swim. °¨ Ride a bicycle. °¨ Operate heavy machinery. °¨ Cook. °¨ Use power tools. °¨ Climb ladders. °¨ Work from a high place. °· Do not make important decisions or sign legal documents until you are improved. °· If you vomit, drink water, juice, or soup when you can drink without vomiting. Make sure you have little or no nausea before eating solid foods. °· Only take over-the-counter or prescription medicines for pain, discomfort, or fever as directed by your health care provider. °· Make sure you and your family fully understand everything about   the medicines given to you, including what side effects may occur. °· You should not drink alcohol, take sleeping pills, or take medicines that cause drowsiness for at least 24 hours. °· If you smoke, do not smoke without supervision. °· If you are feeling better, you may resume normal activities 24 hours after you were sedated. °· Keep all appointments with your health care provider. °SEEK MEDICAL CARE IF: °· Your skin is pale or bluish in color. °· You continue to feel nauseous or vomit. °· Your pain is getting worse and is not helped  by medicine. °· You have bleeding or swelling. °· You are still sleepy or feeling clumsy after 24 hours. °SEEK IMMEDIATE MEDICAL CARE IF: °· You develop a rash. °· You have difficulty breathing. °· You develop any type of allergic problem. °· You have a fever. °MAKE SURE YOU: °· Understand these instructions. °· Will watch your condition. °· Will get help right away if you are not doing well or get worse. °Document Released: 02/03/2013 Document Reviewed: 02/03/2013 °ExitCare® Patient Information ©2015 [employer]. This information is not intended to replace advice given to you by your health care provider. Make sure you discuss any questions you have with your health care provider. ° °

## 2013-11-01 NOTE — H&P (Signed)
Primary Care Physician: [name], MD  Jorge [name] is a 48 y.o. male with a h/o paroxsymal atrial fibrillation who presents today for EP follow-up. He reports that he was initially diagnosed with atrial fibrillation in 2011. He was followed at [facility] by Dr Willis Modena. He underwent cardioversion at that time. He was placed on metoprolol but did not tolerate this medicine due to SOB and fatigue. He was subsequently placed on flecainide. He did very well for several years. He also reports that with alcohol avoidance that his afib was better controlled. Recently, he has had increasing frequency and duration of his atrial fibrillation. When I saw him last, we switched flecainide to multaq. He thinks that his afib has been somewhat better. He is actually in afib today but mostly unaware. He reports increased fatigue recently.  Today, he denies symptoms of palpitations, chest pain, shortness of breath, orthopnea, PND, lower extremity edema, dizziness, presyncope, syncope, or neurologic sequela. The patient is tolerating medications without difficulties and is otherwise without complaint today.  Past Medical History   Diagnosis  Date   .  Atrial fibrillation      Hx of DCCV. Maintained on Flecainide   .  Hyperlipidemia    .  Hypertension    .  Sinusitis    .  Headache     Past Surgical History   Procedure  Laterality  Date   .  Cardioversion       Hx of   .  Appendectomy   1975   .  Rhinoplasty   2006     Dr. Nadeen Landau, [facility]   .  Knee surgery   2009     [facility]    Current Outpatient Prescriptions   Medication  Sig  Dispense  Refill   .  atorvastatin (LIPITOR) 10 MG tablet  Take 10 mg by mouth daily.     .  cetirizine (ZYRTEC) 10 MG tablet  Take 10 mg by mouth as needed for allergies.     Marland Kitchen  dronedarone (MULTAQ) 400 MG tablet  Take 1 tablet (400 mg total) by mouth 2 (two) times daily with a meal.  60 tablet  11   .  nabumetone (RELAFEN) 500 MG tablet  Take  1,000 mg by mouth daily.     .  NON FORMULARY  Citrical - Take 1 tablet at bedtime      No current facility-administered medications for this visit.   No Known Allergies  History    Social History   .  Marital Status:  Married     Spouse Name:  N/A     Number of Children:  N/A   .  Years of Education:  N/A    Occupational History   .  Not on file.    Social History Main Topics   .  Smoking status:  Never Smoker   .  Smokeless tobacco:  Not on file   .  Alcohol Use:  3.0 oz/week     6 drink(s) per week   .  Drug Use:  No   .  Sexual Activity:  Not on file    Other Topics  Concern   .  Not on file    Social History Narrative    Lives in [place] with his spouse. 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  Colon  polyps       Fam Hx   .  Cancer  Mother      breast, cause of death   .  Heart attack  Maternal Grandfather  53     Cause of death   .  Hypertension  Maternal Grandfather    .  Heart failure  Brother    .  CVA  Paternal Grandmother      cause of death   .  Atrial fibrillation  Maternal Grandmother    ROS- All systems are reviewed and negative except as per the HPI above  Physical Exam:  Filed Vitals:    09/16/13 0948   BP:  137/88   Pulse:  94   Height:  5' 10.5" (1.791 m)   Weight:  185 lb (83.915 kg)   GEN- The patient is well appearing, alert and oriented x 3 today.  Head- normocephalic, atraumatic  Eyes- Sclera clear, conjunctiva pink  Ears- hearing intact  Oropharynx- clear  Neck- supple, no JVP  Lymph- no cervical lymphadenopathy  Lungs- Clear to ausculation bilaterally, normal work of breathing  Heart- irregular rate and rhythm, no murmurs, rubs or gallops, PMI not laterally displaced  GI- soft, NT, ND, + BS  Extremities- no clubbing, cyanosis, or edema  MS- no significant deformity or atrophy  Skin- no rash or lesion  Psych- euthymic mood, full affect  Neuro- strength and  sensation are intact  EKG today reveals afib, V rate 94 bpm,  Assessment and Plan:  1. Paroxysmal atrial fibrillation  The patient has symptomatic recurrent afib. He has failed medical therapy with metoprolol and flecainide. He is in afib today despite multaq.  Therapeutic strategies for afib including medicine and ablation were discussed in detail with the patient today. Risk, benefits, and alternatives to EP study and radiofrequency ablation for afib were also discussed in detail today. These risks include but are not limited to stroke, bleeding, vascular damage, tamponade, perforation, damage to the esophagus, lungs, and other structures, pulmonary vein stenosis, worsening renal function, and death. The patient understands these risk and wishes to proceed. He wants to wait until August for the procedure.  I will have him return on Tuesday. If he remains in afib then we will initiate xarelto 20mg  daily and then proceed with cardioversion in 3 weeks.  He will contact me if problems arise.  Prior note in part from Dr. Jackalyn Lombard last visit in office. I have discussed with him TEE, risks (damage to esophagus, etc)  and benefits in anticipation for ablation tomorrow. Understands and is willing to proceed.   Candee Furbish, MD

## 2013-11-01 NOTE — CV Procedure (Signed)
TEE AFIB pre ablation, Dr. Rayann Heman  No thrombus Normal EF Trace AI, MR, TR  No complications.

## 2013-11-02 ENCOUNTER — Encounter (HOSPITAL_COMMUNITY): Payer: Self-pay | Admitting: Certified Registered Nurse Anesthetist

## 2013-11-02 ENCOUNTER — Ambulatory Visit (HOSPITAL_COMMUNITY)
Admission: RE | Admit: 2013-11-02 | Discharge: 2013-11-03 | Disposition: A | Discharge status: Home / Self Care | Payer: BC Managed Care – PPO | Source: Ambulatory Visit | Attending: Internal Medicine | Admitting: Internal Medicine

## 2013-11-02 ENCOUNTER — Encounter (HOSPITAL_COMMUNITY)
Admission: RE | Disposition: A | Discharge status: Home / Self Care | Payer: Self-pay | Source: Ambulatory Visit | Attending: Internal Medicine

## 2013-11-02 ENCOUNTER — Encounter (HOSPITAL_COMMUNITY): Payer: BC Managed Care – PPO | Admitting: Anesthesiology

## 2013-11-02 ENCOUNTER — Ambulatory Visit (HOSPITAL_COMMUNITY): Payer: BC Managed Care – PPO | Admitting: Anesthesiology

## 2013-11-02 DIAGNOSIS — E785 Hyperlipidemia, unspecified: Secondary | ICD-10-CM | POA: Insufficient documentation

## 2013-11-02 DIAGNOSIS — I4819 Other persistent atrial fibrillation: Secondary | ICD-10-CM

## 2013-11-02 DIAGNOSIS — I4891 Unspecified atrial fibrillation: Secondary | ICD-10-CM | POA: Diagnosis present

## 2013-11-02 DIAGNOSIS — Z7901 Long term (current) use of anticoagulants: Secondary | ICD-10-CM | POA: Insufficient documentation

## 2013-11-02 DIAGNOSIS — I10 Essential (primary) hypertension: Secondary | ICD-10-CM | POA: Insufficient documentation

## 2013-11-02 DIAGNOSIS — Z79899 Other long term (current) drug therapy: Secondary | ICD-10-CM | POA: Insufficient documentation

## 2013-11-02 HISTORY — PX: ATRIAL FIBRILLATION ABLATION: SHX5456

## 2013-11-02 LAB — POCT ACTIVATED CLOTTING TIME
ACTIVATED CLOTTING TIME: 287 s
Activated Clotting Time: 253 seconds
Activated Clotting Time: 287 seconds

## 2013-11-02 LAB — MRSA PCR SCREENING: MRSA by PCR: NEGATIVE

## 2013-11-02 SURGERY — ATRIAL FIBRILLATION ABLATION
Anesthesia: Monitor Anesthesia Care

## 2013-11-02 MED ORDER — FENTANYL CITRATE 0.05 MG/ML IJ SOLN
INTRAMUSCULAR | Status: DC | PRN
  Administered 2013-11-02 (×3): 25 ug via INTRAVENOUS

## 2013-11-02 MED ORDER — LACTATED RINGERS IV SOLN
INTRAVENOUS | Status: DC | PRN
  Administered 2013-11-02: 07:00:00 via INTRAVENOUS

## 2013-11-02 MED ORDER — ONDANSETRON HCL 4 MG/2ML IJ SOLN: 4.0000 mg | Freq: Four times a day (QID) | INTRAMUSCULAR | Status: DC | PRN

## 2013-11-02 MED ORDER — BUPIVACAINE HCL (PF) 0.25 % IJ SOLN
INTRAMUSCULAR | Status: AC
  Filled 2013-11-02: qty 30

## 2013-11-02 MED ORDER — FENTANYL CITRATE 0.05 MG/ML IJ SOLN
12.5000 ug | Freq: Once | INTRAMUSCULAR | Status: AC
  Administered 2013-11-02: 25 ug via INTRAVENOUS

## 2013-11-02 MED ORDER — ACETAMINOPHEN 325 MG PO TABS: 650.0000 mg | ORAL_TABLET | ORAL | Status: DC | PRN

## 2013-11-02 MED ORDER — PROPOFOL INFUSION 10 MG/ML OPTIME
INTRAVENOUS | Status: DC | PRN
  Administered 2013-11-02: 100 ug/kg/min via INTRAVENOUS

## 2013-11-02 MED ORDER — SODIUM CHLORIDE 0.9 % IJ SOLN: 3.0000 mL | INTRAMUSCULAR | Status: DC | PRN

## 2013-11-02 MED ORDER — FENTANYL CITRATE 0.05 MG/ML IJ SOLN
INTRAMUSCULAR | Status: AC
  Filled 2013-11-02: qty 2

## 2013-11-02 MED ORDER — HEPARIN SODIUM (PORCINE) 1000 UNIT/ML IJ SOLN
INTRAMUSCULAR | Status: AC
  Filled 2013-11-02: qty 1

## 2013-11-02 MED ORDER — PROTAMINE SULFATE 10 MG/ML IV SOLN
INTRAVENOUS | Status: DC | PRN
  Administered 2013-11-02: 30 mg via INTRAVENOUS

## 2013-11-02 MED ORDER — AMIODARONE HCL 200 MG PO TABS
200.0000 mg | ORAL_TABLET | Freq: Two times a day (BID) | ORAL | Status: DC
  Administered 2013-11-02 – 2013-11-03 (×3): 200 mg via ORAL
  Filled 2013-11-02 (×4): qty 1

## 2013-11-02 MED ORDER — ONDANSETRON HCL 4 MG/2ML IJ SOLN
INTRAMUSCULAR | Status: DC | PRN
  Administered 2013-11-02: 4 mg via INTRAVENOUS

## 2013-11-02 MED ORDER — SODIUM CHLORIDE 0.9 % IV SOLN
INTRAVENOUS | Status: DC
  Administered 2013-11-02: 06:00:00 via INTRAVENOUS

## 2013-11-02 MED ORDER — HYDROCODONE-ACETAMINOPHEN 5-325 MG PO TABS
1.0000 | ORAL_TABLET | ORAL | Status: DC | PRN
  Administered 2013-11-03 (×2): 2 via ORAL
  Filled 2013-11-02 (×6): qty 2

## 2013-11-02 MED ORDER — ONDANSETRON HCL 4 MG/2ML IJ SOLN
INTRAMUSCULAR | Status: AC
  Administered 2013-11-02: 4 mg
  Filled 2013-11-02: qty 2

## 2013-11-02 MED ORDER — SODIUM CHLORIDE 0.9 % IV SOLN: 250.0000 mL | INTRAVENOUS | Status: DC | PRN

## 2013-11-02 MED ORDER — HEPARIN SODIUM (PORCINE) 1000 UNIT/ML IJ SOLN
INTRAMUSCULAR | Status: DC | PRN
  Administered 2013-11-02: 3000 [IU] via INTRAVENOUS
  Administered 2013-11-02: 4000 [IU] via INTRAVENOUS

## 2013-11-02 MED ORDER — SODIUM CHLORIDE 0.9 % IJ SOLN
3.0000 mL | Freq: Two times a day (BID) | INTRAMUSCULAR | Status: DC
  Administered 2013-11-02 – 2013-11-03 (×3): 3 mL via INTRAVENOUS

## 2013-11-02 MED ORDER — RIVAROXABAN 20 MG PO TABS
20.0000 mg | ORAL_TABLET | Freq: Every day | ORAL | Status: DC
  Administered 2013-11-02 – 2013-11-03 (×2): 20 mg via ORAL
  Filled 2013-11-02 (×2): qty 1

## 2013-11-02 MED ORDER — GLYCOPYRROLATE 0.2 MG/ML IJ SOLN
INTRAMUSCULAR | Status: DC | PRN
  Administered 2013-11-02: 0.2 mg via INTRAVENOUS

## 2013-11-02 MED ORDER — MIDAZOLAM HCL 5 MG/5ML IJ SOLN
INTRAMUSCULAR | Status: DC | PRN
  Administered 2013-11-02 (×2): 2 mg via INTRAVENOUS

## 2013-11-02 NOTE — Op Note (Signed)
SURGEON:  Thompson Grayer, MD  PREPROCEDURE DIAGNOSES: 1. Persistent atrial fibrillation.  POSTPROCEDURE DIAGNOSES: 1. Persistent atrial fibrillation.  PROCEDURES: 1. Comprehensive electrophysiologic study. 2. Coronary sinus pacing and recording. 3. Three-dimensional mapping of atrial fibrillation (with additional mapping and ablation of additional left atrial foci) 4. Ablation of atrial fibrillation  (with additional mapping and ablation of additional left atrial foci) 5. Intracardiac echocardiography. 6. Transseptal puncture of an intact septum. 7. Rotational Angiography with processing at an independent workstation 8. Arrhythmia induction with pacing 9.External cardioversion.  INTRODUCTION:  Jorge [name] is a 48 y.o. male with a history of persistent atrial fibrillation who now presents for EP study and radiofrequency ablation.  The patient reports initially being diagnosed with atrial fibrillation after presenting with symptomatic palpitations and fatgiue.  The patient has failed medical therapy with Multaq, metoprolol, amiodarone and flecainide.  The patient therefore presents today for catheter ablation of atrial fibrillation.  DESCRIPTION OF PROCEDURE:  Informed written consent was obtained, and the patient was brought to the electrophysiology lab in a fasting state.  The patient was adequately sedated with intravenous medications as outlined in the anesthesia report.  The patient's left and right groins were prepped and draped in the usual sterile fashion by the EP lab staff.  Using a percutaneous Seldinger technique, two 7-French and one 11-French hemostasis sheaths were placed into the right common femoral vein.    3 Dimensional Rotational Angiography: A 5 french pigtail catheter was introduced through the right common femoral vein and advanced into the inferior venocava.  3 demential rotational angiography was then performed by power injection of 100cc of nonionic contrast.   Reprocessing at an independent work station was then performed.   This demonstrated a moderate sized left atrium.  The patient was noted to have two separate left pulmonary veins.  The right pulmonary veins were small.  There was a middle right pulmonary vein as well which were also small in size.  A 3 dimensional rendering of the left atrium was then merged using [employer] onto the [employer] system and registered with intracardiac echo (see below).  The pigtail catheter was then removed.  Catheter Placement:  A 7-French Biosense Webster Decapolar coronary sinus catheter was introduced through the right common femoral vein and advanced into the coronary sinus for recording and pacing from this location.  A 6-French quadripolar Josephson catheter was introduced through the right common femoral vein and advanced into the right ventricle for recording and pacing.  This catheter was then pulled back to the His bundle location.    Initial Measurements: The patient presented to the electrophysiology lab in atrial fibrillation.  The average RR interval measured 535 msec.  The HV interval 56 msec.     Intracardiac Echocardiography: A 10-French Biosense Webster AcuNav intracardiac echocardiography catheter was introduced through the left common femoral vein and advanced into the right atrium. Intracardiac echocardiography was performed of the left atrium, and a three-dimensional anatomical rendering of the left atrium was performed using CARTO sound technology.  The patient was noted to have a moderate sized left atrium.  The interatrial septum was prominent but not aneurysmal.  The patient was noted to have two separate left pulmonary veins.  The right pulmonary veins were small.  There was a middle right pulmonary vein as well which were also small in size.  The left atrial appendage was visualized and did not reveal thrombus.   There was no evidence of pulmonary vein stenosis.  Transseptal  Puncture: The middle right common femoral vein sheath was exchanged for an 8.5 [place] SL2 transseptal sheath and transseptal access was achieved in a standard fashion using a Brockenbrough needle under biplane fluoroscopy with intracardiac echocardiography confirmation of the transseptal puncture.  Once transseptal access had been achieved, heparin was administered intravenously and intra- arterially in order to maintain an ACT of greater than 300 seconds throughout the procedure.   3D Mapping and Ablation: The His bundle catheter was removed and in its place a 3.5 mm [employer] Thermocool ablation catheter was advanced into the right atrium.  The transseptal sheath was pulled back into the IVC over a guidewire.  The ablation catheter was advanced across the transseptal hole using the wire as a guide.  The transseptal sheath was then re-advanced over the guidewire into the left atrium.  A duodecapolar Biosense Webster circular mapping catheter was introduced through the transseptal sheath and positioned over the mouth of all 4 pulmonary veins (the right middle pulmonary vein was small and was isolated with the right superior pulmonary vein.  Three-dimensional electroanatomical mapping was performed using CARTO technology.  This demonstrated prodigious electrical activity within all pulmonary veins at baseline. The patient underwent successful sequential electrical isolation and anatomical encircling of the pulmonary veins using radiofrequency current with a circular mapping catheter as a guide.  A WACA approach was used. Additional left atrial mapping was then performed.  There were not very many areas of Complex fractionated electrograms (CFAEs).  CFAEs were then identified and ablated along the roof of the left atrium and along the inferior portion of the postior wall of the left atrium.    Cardioversion: The patient was then cardioverted to sinus rhythm with a single synchronized 360-J  biphasic shock with cardioversion electrodes in the anterior-posterior thoracic configuration.  He remained in sinus rhythm thereafter.  Measurements Following Ablation: In sinus rhythm the RR interval was 963msec, with PR 209 msec, QRS 103 msec, and Qtc 418 msec.  Following ablation the AH interval measured 126 msec with an HV interval of 56 msec. Ventricular pacing was performed, which revealed VA dissociation when pacing at 600 msec.  Rapid atrial pacing was performed, which revealed an AV Wenckebach cycle length of 470 msec.  Electroisolation was then again confirmed in all four pulmonary veins.  The procedure was therefore considered completed.  All catheters were removed, and the sheaths were aspirated and flushed.  The patient was transferred to the recovery area for sheath removal per protocol.  A limited bedside transthoracic echocardiogram revealed no pericardial effusion.  There were no early apparent complications.  CONCLUSIONS: 1. Atrial fibrillation upon presentation.   2. Rotational Angiography reveals a moderate sized left atrium with five pulmonary veins (there was a small right middle pulmonary vein) without evidence of pulmonary vein stenosis. 3. Successful electrical isolation and anatomical encircling of the pulmonary veins with radiofrequency current using a WACA approach. 4. CFAEs were then identified and ablated along the roof of the left atrium and along the inferior portion of the postior wall of the left atrium. 5. Atrial fibrillation successfully cardioverted to sinus rhythm. 6. No early apparent complications.   Heavenlee Maiorana,MD 10:20 AM 11/02/2013

## 2013-11-02 NOTE — H&P (Signed)
Primary Care Physician: [name], MD   Jorge [name] is a 48 y.o. male with a h/o persistent atrial fibrillation who presents today for afib ablation. He reports that he was initially diagnosed with atrial fibrillation in 2011. He was followed at [facility] by Dr Willis Modena. He underwent cardioversion at that time. He was placed on metoprolol but did not tolerate this medicine due to SOB and fatigue. He was subsequently placed on flecainide. He did very well for several years. He also reports that with alcohol avoidance that his afib was better controlled. Recently, he has had increasing frequency and duration of his atrial fibrillation.  He reports increased fatigue recently.  He was recently cardioverted but has returned to afib.  He has been anticoagulated with xarelto.  He has failed medical therapy with multaq and flecainide. Today, he denies symptoms of palpitations, chest pain, shortness of breath, orthopnea, PND, lower extremity edema, dizziness, presyncope, syncope, or neurologic sequela. The patient is tolerating medications without difficulties and is otherwise without complaint today.   Past Medical History   Diagnosis  Date   .  Atrial fibrillation      Hx of DCCV. Maintained on Flecainide   .  Hyperlipidemia    .  Hypertension    .  Sinusitis    .  Headache     Past Surgical History   Procedure  Laterality  Date   .  Cardioversion       Hx of   .  Appendectomy   1975   .  Rhinoplasty   2006     Dr. Nadeen Landau, [facility]   .  Knee surgery   2009     [facility]    Current Outpatient Prescriptions   Medication  Sig  Dispense  Refill   .  atorvastatin (LIPITOR) 10 MG tablet  Take 10 mg by mouth daily.     .  cetirizine (ZYRTEC) 10 MG tablet  Take 10 mg by mouth as needed for allergies.     Marland Kitchen  dronedarone (MULTAQ) 400 MG tablet  Take 1 tablet (400 mg total) by mouth 2 (two) times daily with a meal.  60 tablet  11   .  nabumetone (RELAFEN) 500 MG tablet   Take 1,000 mg by mouth daily.     .  NON FORMULARY  [employer] - Take 1 tablet at bedtime      No current facility-administered medications for this visit.   No Known Allergies  History    Social History   .  Marital Status:  Married     Spouse Name:  N/A     Number of Children:  N/A   .  Years of Education:  N/A    Occupational History   .  Not on file.    Social History Main Topics   .  Smoking status:  Never Smoker   .  Smokeless tobacco:  Not on file   .  Alcohol Use:  3.0 oz/week     6 drink(s) per week   .  Drug Use:  No   .  Sexual Activity:  Not on file    Other Topics  Concern   .  Not on file    Social History Narrative    Lives in [place] with his spouse. 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  Colon  polyps       Fam Hx   .  Cancer  Mother      breast, cause of death   .  Heart attack  Maternal Grandfather  53     Cause of death   .  Hypertension  Maternal Grandfather    .  Heart failure  Brother    .  CVA  Paternal Grandmother      cause of death   .  Atrial fibrillation  Maternal Grandmother    ROS- All systems are reviewed and negative except as per the HPI above  Physical Exam:  Filed Vitals:   11/02/13 0544  BP: 134/92  Pulse: 99  Temp: 98 F (36.7 C)  Resp: 18   GEN- The patient is well appearing, alert and oriented x 3 today.  Head- normocephalic, atraumatic  Eyes- Sclera clear, conjunctiva pink  Ears- hearing intact  Oropharynx- clear  Neck- supple, no JVP  Lymph- no cervical lymphadenopathy  Lungs- Clear to ausculation bilaterally, normal work of breathing  Heart- irregular rate and rhythm, no murmurs, rubs or gallops, PMI not laterally displaced  GI- soft, NT, ND, + BS  Extremities- no clubbing, cyanosis, or edema  MS- no significant deformity or atrophy  Skin- no rash or lesion  Psych- euthymic mood, full affect  Neuro- strength and sensation are intact     Assessment and Plan:  1. Atrial fibrillation  The patient has symptomatic recurrent afib. He has failed medical therapy with metoprolol, multaq, and flecainide. He is in afib today despite multaq.  TEE is reviewed. Therapeutic strategies for afib including medicine and ablation were discussed in detail with the patient today. Risk, benefits, and alternatives to EP study and radiofrequency ablation for afib were also discussed in detail today. These risks include but are not limited to stroke, bleeding, vascular damage, tamponade, perforation, damage to the esophagus, lungs, and other structures, pulmonary vein stenosis, worsening renal function, and death. The patient understands these risk and wishes to proceed at this time.

## 2013-11-02 NOTE — Progress Notes (Signed)
Site area: right groin  Site Prior to Removal:  Level 0   Pressure Applied For 40 MINUTES    Minutes Beginning at 1105am  Manual:   Yes.    Patient Status During Pull:  Stable    Post Pull Groin Site:  Level 0  Post Pull Instructions Given:  Yes.    Post Pull Pulses Present:  Yes.    Dressing Applied:  Yes.    Comments:  A 78f,  23fr, and 79fr, venous sheath removed and tolerated well.  Hemostatis achieved.  Pt stable and transferred to 2H-18 via stretcher.

## 2013-11-02 NOTE — Progress Notes (Signed)
Three right femoral venous sheaths removed by [facility]

## 2013-11-02 NOTE — Progress Notes (Signed)
ACT drawn.

## 2013-11-02 NOTE — Transfer of Care (Signed)
Immediate Anesthesia Transfer of Care Note  Patient: Jorge [name]  Procedure(s) Performed: Procedure(s): ATRIAL FIBRILLATION ABLATION (N/A)  Patient Location: PACU (EP [facility] #21)  Anesthesia Type:MAC  Level of Consciousness: awake, alert , oriented and patient cooperative  Airway & Oxygen Therapy: Patient Spontanous Breathing room air  Post-op Assessment: Report given to PACU RN, Post -op Vital signs reviewed and stable and Patient moving all extremities  Post vital signs: Reviewed and stable  Complications: No apparent anesthesia complications

## 2013-11-02 NOTE — Discharge Summary (Signed)
ELECTROPHYSIOLOGY PROCEDURE DISCHARGE SUMMARY    Patient ID: Jorge [name],  MRN: [id number], DOB/AGE: [birth year] 48 y.o.  Admit date: 11/02/2013 Discharge date: 11/03/2013  Primary Care Physician: [name], MD Electrophysiologist: Thompson Grayer, MD  Primary Discharge Diagnosis:  Persistent atrial fibrillation  Secondary Discharge Diagnosis:  1.  Hyperlipidemia 2.  Hypertension  Procedures This Admission:  1.  Electrophysiology study and radiofrequency catheter ablation on 11-02-2013 by Dr Thompson Grayer.  This study demonstrated atrial fibrillation upon presentation; rotational Angiography reveals a moderate sized left atrium with five pulmonary veins (there was a small right middle pulmonary vein) without evidence of pulmonary vein stenosis; successful electrical isolation and anatomical encircling of the pulmonary veins with radiofrequency current using a WACA approach; CFAEs were then identified and ablated along the roof of the left atrium and along the inferior portion of the postior wall of the left atrium; atrial fibrillation successfully cardioverted to sinus rhythm. There were no early apparent complications.   Brief HPI: Jorge [name] is a 48 y.o. male with a history of persistent atrial fibrillation.  They have failed medical therapy with Multaq, flecainide, and amiodarone. Risks, benefits, and alternatives to catheter ablation of atrial fibrillation were reviewed with the patient who wished to proceed.  The patient underwent TEE prior to the procedure which demonstrated normal LV function and no LAA thrombus.    Hospital Course:  The patient was admitted and underwent EPS/RFCA of atrial fibrillation with details as outlined above.  They were monitored on telemetry overnight which demonstrated sinus rhythm.  He had oozing from groin overnight.  There was no hematoma.  Groin was injected with lidocaine and epinepherine with manual pressure held which  resolved the oozing.  He remained on additional bedrest the day of discharge for 4 hours.  The patient was examined by Dr Rayann Heman and considered to be stable for discharge.  Wound care and restrictions were reviewed with the patient.  The patient will be seen back by Dr Rayann Heman in 4 weeks for post ablation follow up.   Discharge Vitals: Blood pressure 141/92, pulse 76, temperature 98.6 F (37 C), temperature source Oral, resp. rate 18, height 5\' 11"  (1.803 m), weight 185 lb (83.915 kg), SpO2 98.00%.  Physical Exam: Filed Vitals:   11/02/13 2334 11/03/13 0319 11/03/13 0543 11/03/13 0749  BP: 150/86 139/88 129/91 141/92  Pulse: 89 76    Temp: 98.5 F (36.9 C) 98.2 F (36.8 C)  98.6 F (37 C)  TempSrc: Oral Oral  Oral  Resp:    18  Height:      Weight:      SpO2: 98% 94%  98%    GEN- The patient is well appearing, alert and oriented x 3 today.   Head- normocephalic, atraumatic Eyes-  Sclera clear, conjunctiva pink Ears- hearing intact Oropharynx- clear Neck- supple, Lungs- Clear to ausculation bilaterally, normal work of breathing Heart- Regular rate and rhythm, no murmurs, rubs or gallops, PMI not laterally displaced GI- soft, NT, ND, + BS Extremities- no clubbing, cyanosis, or edema, groin is with mild oozing but without hematoma/ bruit MS- no significant deformity or atrophy Skin- no rash or lesion Psych- euthymic mood, full affect Neuro- strength and sensation are intact   Labs:   Lab Results  Component Value Date   WBC 6.3 10/26/2013   HGB 14.0 10/26/2013   HCT 41.2 10/26/2013   MCV 91.2 10/26/2013   PLT 251.0 10/26/2013     Recent Labs Lab 11/03/13  0316  NA 140  K 3.9  CL 101  CO2 25  BUN 14  CREATININE 0.90  CALCIUM 8.9  GLUCOSE 94      Discharge Medications:    Medication List         amiodarone 200 MG tablet  Commonly known as:  PACERONE  200mg  by mouth twice daily for 7 days then 200mg  daily thereafter     atorvastatin 10 MG tablet  Commonly  known as:  LIPITOR  Take 10 mg by mouth daily.     diltiazem 240 MG 24 hr capsule  Commonly known as:  CARDIZEM CD  Take 1 capsule (240 mg total) by mouth daily.     pantoprazole 40 MG tablet  Commonly known as:  PROTONIX  Take 1 tablet (40 mg total) by mouth daily.     rivaroxaban 20 MG Tabs tablet  Commonly known as:  XARELTO  Take 1 tablet (20 mg total) by mouth daily with supper.        Disposition:   Follow-up Information   Follow up with Thompson Grayer, MD On 12/15/2013. (1:45 pm)    Specialty:  Cardiology   Contact information:   [address] [place] [address] [phone]       Duration of Discharge Encounter: Greater than 30 minutes including physician time.  Signed,  Thompson Grayer MD

## 2013-11-02 NOTE — Anesthesia Preprocedure Evaluation (Addendum)
Anesthesia Evaluation  Patient identified by MRN, date of birth, ID band Patient awake    Reviewed: Allergy & Precautions, H&P , NPO status , Patient's Chart, lab work & pertinent test results  Airway Mallampati: II TM Distance: >3 FB Neck ROM: Full    Dental no notable dental hx. (+) Teeth Intact, Dental Advisory Given   Pulmonary neg pulmonary ROS,  breath sounds clear to auscultation  Pulmonary exam normal       Cardiovascular hypertension, On Medications + dysrhythmias Atrial Fibrillation Rhythm:Irregular Rate:Normal     Neuro/Psych  Headaches, negative psych ROS   GI/Hepatic negative GI ROS, Neg liver ROS,   Endo/Other  negative endocrine ROS  Renal/GU negative Renal ROS  negative genitourinary   Musculoskeletal   Abdominal   Peds  Hematology negative hematology ROS (+)   Anesthesia Other Findings   Reproductive/Obstetrics negative OB ROS                          Anesthesia Physical Anesthesia Plan  ASA: III  Anesthesia Plan: MAC   Post-op Pain Management:    Induction: Intravenous  Airway Management Planned: Simple Face Mask  Additional Equipment:   Intra-op Plan:   Post-operative Plan:   Informed Consent: I have reviewed the patients History and Physical, chart, labs and discussed the procedure including the risks, benefits and alternatives for the proposed anesthesia with the patient or authorized representative who has indicated his/her understanding and acceptance.   Dental advisory given  Plan Discussed with: CRNA  Anesthesia Plan Comments:         Anesthesia Quick Evaluation

## 2013-11-02 NOTE — Progress Notes (Signed)
ACT 174

## 2013-11-02 NOTE — Anesthesia Procedure Notes (Signed)
Procedure Name: MAC Date/Time: 11/02/2013 7:45 AM Performed by: Ned Grace Pre-anesthesia Checklist: Patient identified, Emergency Drugs available, Timeout performed, Suction available and Patient being monitored Patient Re-evaluated:Patient Re-evaluated prior to inductionOxygen Delivery Method: Simple face mask Intubation Type: IV induction

## 2013-11-03 ENCOUNTER — Encounter (HOSPITAL_COMMUNITY): Payer: Self-pay | Admitting: *Deleted

## 2013-11-03 LAB — BASIC METABOLIC PANEL
Anion gap: 14 (ref 5–15)
BUN: 14 mg/dL (ref 6–23)
CO2: 25 mEq/L (ref 19–32)
Calcium: 8.9 mg/dL (ref 8.4–10.5)
Chloride: 101 mEq/L (ref 96–112)
Creatinine, Ser: 0.9 mg/dL (ref 0.50–1.35)
GFR calc Af Amer: >90 mL/min (ref >90)
Glucose, Bld: 94 mg/dL (ref 70–99)
Potassium: 3.9 mEq/L (ref 3.7–5.3)
Sodium: 140 mEq/L (ref 137–147)

## 2013-11-03 LAB — POCT ACTIVATED CLOTTING TIME: Activated Clotting Time: 174 seconds

## 2013-11-03 MED ORDER — LIDOCAINE-EPINEPHRINE 1 %-1:100000 IJ SOLN
INTRAMUSCULAR | Status: AC
  Filled 2013-11-03: qty 1

## 2013-11-03 MED ORDER — AMIODARONE HCL 200 MG PO TABS
ORAL_TABLET | ORAL | Status: DC
Start: 2013-11-03 — End: 2013-12-17

## 2013-11-03 MED ORDER — PANTOPRAZOLE SODIUM 40 MG PO TBEC: 40.0000 mg | DELAYED_RELEASE_TABLET | Freq: Every day | ORAL | Status: DC

## 2013-11-03 NOTE — Progress Notes (Signed)
11/03/2013 5:22 PM   Discharge instructions given. Verbalized understanding. Rt femoral site WNL.  To car via w/c

## 2013-11-03 NOTE — Discharge Instructions (Signed)
No driving for 5 days. No lifting over 5 lbs for 1 week. No sexual activity for 1 week. You may return to work in 1 week. Keep procedure site clean & dry. If you notice increased pain, swelling, bleeding or pus, call/return!  You may shower, but no soaking baths/hot tubs/pools for 1 week.   Information on my medicine - XARELTO (Rivaroxaban)  This medication education was reviewed with me or my healthcare representative as part of my discharge preparation.  The pharmacist that spoke with me during my hospital stay was:  Arty Baumgartner, [facility]   Why was Xarelto prescribed for you? Xarelto was prescribed for you to reduce the risk of a blood clot forming that can cause a stroke if you have a medical condition called atrial fibrillation (a type of irregular heartbeat).  What do you need to know about xarelto ? Take your Xarelto ONCE DAILY at the same time every day with your evening meal. If you have difficulty swallowing the tablet whole, you may crush it and mix in applesauce just prior to taking your dose.  Take Xarelto exactly as prescribed by your doctor and DO NOT stop taking Xarelto without talking to the doctor who prescribed the medication.  Stopping without other stroke prevention medication to take the place of Xarelto may increase your risk of developing a clot that causes a stroke.  Refill your prescription before you run out.  After discharge, you should have regular check-up appointments with your healthcare provider that is prescribing your Xarelto.  In the future your dose may need to be changed if your kidney function or weight changes by a significant amount.  What do you do if you miss a dose? If you are taking Xarelto ONCE DAILY and you miss a dose, take it as soon as you remember on the same day then continue your regularly scheduled once daily regimen the next day. Do not take two doses of Xarelto at the same time or on the same day.   Important Safety  Information A possible side effect of Xarelto is bleeding. You should call your healthcare provider right away if you experience any of the following:   Bleeding from an injury or your nose that does not stop.   Unusual colored urine (red or dark brown) or unusual colored stools (red or black).   Unusual bruising for unknown reasons.   A serious fall or if you hit your head (even if there is no bleeding).  Some medicines may interact with Xarelto and might increase your risk of bleeding while on Xarelto. To help avoid this, consult your healthcare provider or pharmacist prior to using any new prescription or non-prescription medications, including herbals, vitamins, non-steroidal anti-inflammatory drugs (NSAIDs) and supplements.  This website has more information on Xarelto: [email or web address]

## 2013-11-03 NOTE — Progress Notes (Signed)
Called to see patient due to right groin oozing.  Afib procedure done yesterday and right groin site started oozing throughout the night.  Pressure held x 15 minutes still with some oozing then lidocaine with epi injected into site tract and pressure held x 15 minutes.  Oozing stopped and instructions given for 4 hrs of bedrest.  Will place femstop devise if oozing continues.  Dr. Rayann Heman and Luetta Nutting both aware of groin site oozing.

## 2013-11-04 ENCOUNTER — Telehealth: Payer: Self-pay | Admitting: Internal Medicine

## 2013-11-04 NOTE — Anesthesia Postprocedure Evaluation (Signed)
  Anesthesia Post-op Note  Patient: Jorge [name]  Procedure(s) Performed: Procedure(s): ATRIAL FIBRILLATION ABLATION (N/A)  Patient discharged to home 11/03/13 with no apparent anesthetic complications

## 2013-11-04 NOTE — Telephone Encounter (Signed)
Pt calls today b/c he feels like the right side of his head is " impacted" greenish mucus with trace of blood.   States he has sinus infections & that this is what he feels he has. (States he had a headache while in the hospital this week Tylenol & Vicodin did not help)  I checked with Ysidro Evert about contraindications of Xarelto with antibiotics. Okay Pt will contact his pcp for antibiotics Horton Chin RN

## 2013-11-04 NOTE — Telephone Encounter (Signed)
Patient has sinus infection and would like an antibiotic called in for it. He uses [facility] [phone]. Please call and advise.

## 2013-12-15 ENCOUNTER — Encounter: Payer: Self-pay | Admitting: Internal Medicine

## 2013-12-15 ENCOUNTER — Ambulatory Visit (INDEPENDENT_AMBULATORY_CARE_PROVIDER_SITE_OTHER): Payer: BC Managed Care – PPO | Admitting: Internal Medicine

## 2013-12-15 VITALS — BP 132/96 | HR 110 | Ht 70.5 in | Wt 185.8 lb

## 2013-12-15 DIAGNOSIS — Z01812 Encounter for preprocedural laboratory examination: Secondary | ICD-10-CM

## 2013-12-15 DIAGNOSIS — I4891 Unspecified atrial fibrillation: Secondary | ICD-10-CM

## 2013-12-15 DIAGNOSIS — I10 Essential (primary) hypertension: Secondary | ICD-10-CM

## 2013-12-15 DIAGNOSIS — I4819 Other persistent atrial fibrillation: Secondary | ICD-10-CM

## 2013-12-15 LAB — CBC
HCT: 42.4 % (ref 39.0–52.0)
Hemoglobin: 14.4 g/dL (ref 13.0–17.0)
MCHC: 34 g/dL (ref 30.0–36.0)
MCV: 91.3 fl (ref 78.0–100.0)
Platelets: 286 10*3/uL (ref 150.0–400.0)
RBC: 4.64 Mil/uL (ref 4.22–5.81)
RDW: 13 % (ref 11.5–15.5)
WBC: 8.3 10*3/uL (ref 4.0–10.5)

## 2013-12-15 LAB — BASIC METABOLIC PANEL
BUN: 18 mg/dL (ref 6–23)
CO2: 27 mEq/L (ref 19–32)
CREATININE: 1.2 mg/dL (ref 0.4–1.5)
Calcium: 9.2 mg/dL (ref 8.4–10.5)
Chloride: 103 mEq/L (ref 96–112)
GFR: 69.93 mL/min (ref >60.00)
Glucose, Bld: 90 mg/dL (ref 70–99)
Potassium: 3.7 mEq/L (ref 3.5–5.1)
SODIUM: 137 meq/L (ref 135–145)

## 2013-12-15 NOTE — Patient Instructions (Addendum)
Your physician recommends that you continue on your current medications as directed. Please refer to the Current Medication list given to you today.  PLEASE COME TO THE OFFICE ON Monday, 8/24 FOR AN EKG TO DETERMINE IF YOU WILL BE NEEDING CARDIOVERSION.  Your physician recommends that you schedule a follow-up appointment in: [place]. Your physician recommends that you return for lab work in: TODAY (BMET, CBC)

## 2013-12-15 NOTE — Progress Notes (Signed)
PCP: Jorge Post, MD  Jorge [name] is a 48 y.o. male who presents today for routine electrophysiology followup.  Since his recent afib ablation, the patient reports doing very well.   He denies procedure related complications.  He has had ERAF with minimal symptoms. Today, he denies symptoms of palpitations, chest pain, shortness of breath,  lower extremity edema, dizziness, presyncope, or syncope.  The patient is otherwise without complaint today.   Past Medical History  Diagnosis Date  . Atrial fibrillation     first diagnosed 2011; s/p DCCV at that time; PVI 10-2013  . Hyperlipidemia   . Hypertension   . Sinusitis   . Headache    Past Surgical History  Procedure Laterality Date  . Cardioversion      Hx of  . Appendectomy  1975  . Rhinoplasty  2006    Dr. Nadeen Landau, [facility]  . Knee surgery  2009    [facility]  . Hernia repair      1.30.15  . Cardioversion N/A 10/15/2013    Procedure: CARDIOVERSION;  Surgeon: Fay Records, MD;  Location: [place];  Service: Cardiovascular;  Laterality: N/A;  . Tee without cardioversion N/A 11/01/2013    Procedure: TRANSESOPHAGEAL ECHOCARDIOGRAM (TEE);  Surgeon: Candee Furbish, MD;  Location: [facility] ENDOSCOPY;  Service: Cardiovascular;  Laterality: N/A;  . Ablation  11-02-2013    PVI by Dr Rayann Heman    ROS- all systems are reviewed and negatives except as per HPI above  Current Outpatient Prescriptions  Medication Sig Dispense Refill  . amiodarone (PACERONE) 200 MG tablet 200mg  by mouth twice daily for 7 days then 200mg  daily thereafter  180 tablet  3  . atorvastatin (LIPITOR) 10 MG tablet Take 10 mg by mouth daily.      Marland Kitchen diltiazem (CARDIZEM CD) 240 MG 24 hr capsule Take 1 capsule (240 mg total) by mouth daily.  30 capsule  3  . pantoprazole (PROTONIX) 40 MG tablet Take 1 tablet (40 mg total) by mouth daily.  60 tablet  0  . rivaroxaban (XARELTO) 20 MG TABS tablet Take 1 tablet (20 mg total) by mouth daily with  supper.  30 tablet  11   No current facility-administered medications for this visit.    Physical Exam: Filed Vitals:   12/15/13 1402  BP: 132/96  Pulse: 110  Height: 5' 10.5" (1.791 m)  Weight: 185 lb 12.8 oz (84.278 kg)    GEN- The patient is well appearing, alert and oriented x 3 today.   Head- normocephalic, atraumatic Eyes-  Sclera clear, conjunctiva pink Ears- hearing intact Oropharynx- clear Lungs- Clear to ausculation bilaterally, normal work of breathing Heart- irregular rate and rhythm, no murmurs, rubs or gallops, PMI not laterally displaced GI- soft, NT, ND, + BS Extremities- no clubbing, cyanosis, or edema  ekg reveals afib, V rate 110 bpm  Assessment and Plan:  1. afib ERAF [name] ablation Will return next week.  If still in afib, will need cardioversion.  Continue anticoagulation and amiodarone for now Risks, benefits, and alternatives to cardioversion were discussed with the patient who wishes to proceed.  2. HTN Stable No change required today  Return to see me in 6 weeks

## 2013-12-17 ENCOUNTER — Encounter (HOSPITAL_COMMUNITY): Payer: Self-pay | Admitting: Pharmacy Technician

## 2013-12-20 ENCOUNTER — Ambulatory Visit (HOSPITAL_COMMUNITY)
Admission: RE | Admit: 2013-12-20 | Discharge: 2013-12-20 | Disposition: A | Discharge status: Home / Self Care | Payer: BC Managed Care – PPO | Source: Ambulatory Visit | Attending: Cardiology | Admitting: Cardiology

## 2013-12-20 ENCOUNTER — Telehealth: Payer: Self-pay | Admitting: Internal Medicine

## 2013-12-20 ENCOUNTER — Ambulatory Visit (INDEPENDENT_AMBULATORY_CARE_PROVIDER_SITE_OTHER): Payer: BC Managed Care – PPO | Admitting: Internal Medicine

## 2013-12-20 ENCOUNTER — Encounter (HOSPITAL_COMMUNITY)
Admission: RE | Disposition: A | Discharge status: Home / Self Care | Payer: Self-pay | Source: Ambulatory Visit | Attending: Cardiology

## 2013-12-20 ENCOUNTER — Encounter (HOSPITAL_COMMUNITY): Payer: Self-pay | Admitting: Cardiology

## 2013-12-20 ENCOUNTER — Encounter: Payer: Self-pay | Admitting: Internal Medicine

## 2013-12-20 ENCOUNTER — Encounter (HOSPITAL_COMMUNITY): Payer: BC Managed Care – PPO | Admitting: Anesthesiology

## 2013-12-20 ENCOUNTER — Ambulatory Visit (HOSPITAL_COMMUNITY): Payer: BC Managed Care – PPO | Admitting: Anesthesiology

## 2013-12-20 VITALS — BP 120/82 | HR 97 | Ht 70.0 in | Wt 185.0 lb

## 2013-12-20 DIAGNOSIS — Z7901 Long term (current) use of anticoagulants: Secondary | ICD-10-CM | POA: Insufficient documentation

## 2013-12-20 DIAGNOSIS — Z79899 Other long term (current) drug therapy: Secondary | ICD-10-CM | POA: Diagnosis not present

## 2013-12-20 DIAGNOSIS — I4891 Unspecified atrial fibrillation: Secondary | ICD-10-CM

## 2013-12-20 DIAGNOSIS — E785 Hyperlipidemia, unspecified: Secondary | ICD-10-CM | POA: Diagnosis not present

## 2013-12-20 DIAGNOSIS — Z538 Procedure and treatment not carried out for other reasons: Secondary | ICD-10-CM | POA: Insufficient documentation

## 2013-12-20 DIAGNOSIS — I10 Essential (primary) hypertension: Secondary | ICD-10-CM | POA: Diagnosis not present

## 2013-12-20 DIAGNOSIS — I480 Paroxysmal atrial fibrillation: Secondary | ICD-10-CM

## 2013-12-20 SURGERY — Surgical Case
Anesthesia: Monitor Anesthesia Care

## 2013-12-20 MED ORDER — SODIUM CHLORIDE 0.9 % IV SOLN
INTRAVENOUS | Status: DC | PRN
  Administered 2013-12-20: 10:00:00 via INTRAVENOUS

## 2013-12-20 MED ORDER — SODIUM CHLORIDE 0.9 % IJ SOLN: 3.0000 mL | INTRAMUSCULAR | Status: DC | PRN

## 2013-12-20 MED ORDER — SODIUM CHLORIDE 0.9 % IJ SOLN: 3.0000 mL | Freq: Two times a day (BID) | INTRAMUSCULAR | Status: DC

## 2013-12-20 MED ORDER — SODIUM CHLORIDE 0.9 % IV SOLN
250.0000 mL | INTRAVENOUS | Status: DC
Start: 2013-12-20 — End: 2013-12-20

## 2013-12-20 NOTE — Progress Notes (Signed)
Pt was padded and ready for direct current elective cardioversion when pt self converted in NSR. No sedation was given. Dr. Stanford Breed was present and assessed patient. Then ordered a 12 lead EKG. Pt OK to discharge.

## 2013-12-20 NOTE — Anesthesia Preprocedure Evaluation (Addendum)
Anesthesia Evaluation  Patient identified by MRN, date of birth, ID band Patient awake    Reviewed: Allergy & Precautions, H&P , NPO status , Patient's Chart, lab work & pertinent test results  Airway Mallampati: II TM Distance: >3 FB Neck ROM: Full    Dental no notable dental hx. (+) Teeth Intact, Dental Advisory Given   Pulmonary neg pulmonary ROS,  breath sounds clear to auscultation  Pulmonary exam normal       Cardiovascular hypertension, On Medications + dysrhythmias Atrial Fibrillation Rhythm:Irregular Rate:Normal     Neuro/Psych  Headaches, negative psych ROS   GI/Hepatic negative GI ROS, Neg liver ROS,   Endo/Other  negative endocrine ROS  Renal/GU negative Renal ROS  negative genitourinary   Musculoskeletal   Abdominal   Peds  Hematology negative hematology ROS (+)   Anesthesia Other Findings   Reproductive/Obstetrics negative OB ROS                          Anesthesia Physical  Anesthesia Plan  ASA: III  Anesthesia Plan: MAC   Post-op Pain Management:    Induction: Intravenous  Airway Management Planned: Simple Face Mask  Additional Equipment:   Intra-op Plan:   Post-operative Plan:   Informed Consent: I have reviewed the patients History and Physical, chart, labs and discussed the procedure including the risks, benefits and alternatives for the proposed anesthesia with the patient or authorized representative who has indicated his/her understanding and acceptance.   Dental advisory given  Plan Discussed with: CRNA  Anesthesia Plan Comments:         Anesthesia Quick Evaluation

## 2013-12-20 NOTE — Progress Notes (Signed)
PCP: [name], MD  [place] II is a 48 y.o. male who presents today for electrophysiology followup.   He has had ERAF with minimal symptoms. Today, he denies symptoms of palpitations, chest pain, shortness of breath,  lower extremity edema, dizziness, presyncope, or syncope.  The patient is otherwise without complaint today.  He has been compliant with anticoagulation.  Past Medical History  Diagnosis Date  . Atrial fibrillation     first diagnosed 2011; s/p DCCV at that time; PVI 10-2013  . Hyperlipidemia   . Hypertension   . Sinusitis   . Headache    Past Surgical History  Procedure Laterality Date  . Cardioversion      Hx of  . Appendectomy  1975  . Rhinoplasty  2006    Dr. Nadeen Landau, [facility]  . Knee surgery  2009    [facility]  . Hernia repair      1.30.15  . Cardioversion N/A 10/15/2013    Procedure: CARDIOVERSION;  Surgeon: Fay Records, MD;  Location: [place];  Service: Cardiovascular;  Laterality: N/A;  . Tee without cardioversion N/A 11/01/2013    Procedure: TRANSESOPHAGEAL ECHOCARDIOGRAM (TEE);  Surgeon: Candee Furbish, MD;  Location: [facility] ENDOSCOPY;  Service: Cardiovascular;  Laterality: N/A;  . Ablation  11-02-2013    PVI by Dr Rayann Heman    ROS- all systems are reviewed and negatives except as per HPI above  Current Outpatient Prescriptions  Medication Sig Dispense Refill  . amiodarone (PACERONE) 200 MG tablet Take 200 mg by mouth daily.      Marland Kitchen atorvastatin (LIPITOR) 10 MG tablet Take 10 mg by mouth daily.      Marland Kitchen diltiazem (CARDIZEM CD) 240 MG 24 hr capsule Take 1 capsule (240 mg total) by mouth daily.  30 capsule  3  . pantoprazole (PROTONIX) 40 MG tablet Take 1 tablet (40 mg total) by mouth daily.  60 tablet  0  . rivaroxaban (XARELTO) 20 MG TABS tablet Take 1 tablet (20 mg total) by mouth daily with supper.  30 tablet  11   No current facility-administered medications for this visit.    Physical Exam: Filed Vitals:   12/20/13 0834  BP: 120/82  Pulse: 97  Height: 5\' 10"  (1.778 m)  Weight: 185 lb (83.915 kg)    GEN- The patient is well appearing, alert and oriented x 3 today.   Head- normocephalic, atraumatic Eyes-  Sclera clear, conjunctiva pink Ears- hearing intact Oropharynx- clear Lungs- Clear to ausculation bilaterally, normal work of breathing Heart- irregular rate and rhythm, no murmurs, rubs or gallops, PMI not laterally displaced GI- soft, NT, ND, + BS Extremities- no clubbing, cyanosis, or edema  ekg reveals afib, V rate 97 bpm  Assessment and Plan:  1. afib ERAF post ablation Risks, benefits, and alternatives to cardioversion were discussed with the patient who wishes to proceed.  He is already scheduled for cardioversion this am.  2. HTN Stable No change required today  Return to see me in Sept as scheduled

## 2013-12-20 NOTE — Procedures (Signed)
Patient presented for DCCV and converted to sinus prior to the procedure; patient will continue present medications and FU with Dr Rayann Heman. Kirk Ruths

## 2013-12-20 NOTE — Addendum Note (Signed)
Addendum created 12/20/13 1253 by Clearnce Sorrel, CRNA   Modules edited: Anesthesia Events

## 2013-12-20 NOTE — Patient Instructions (Signed)
Will head over to [facility] for DCCV at 10:00am

## 2013-12-20 NOTE — Telephone Encounter (Signed)
New message     Talk to [facility] regarding cardioversion

## 2013-12-20 NOTE — Interval H&P Note (Signed)
History and Physical Interval Note:  12/20/2013 9:42 AM  Jorge [name]  has presented today for surgery, with the diagnosis of afib  The various methods of treatment have been discussed with the patient and family. After consideration of risks, benefits and other options for treatment, the patient has consented to  Procedure(s): CARDIOVERSION (N/A) as a surgical intervention .  The patient's history has been reviewed, patient examined, no change in status, stable for surgery.  I have reviewed the patient's chart and labs.  Questions were answered to the patient's satisfaction.     Kirk Ruths

## 2013-12-20 NOTE — Telephone Encounter (Signed)
Went back into [facility] and did not have to have DCCV

## 2013-12-20 NOTE — H&P (Signed)
Dara Hoyer II Description: 48 year old male  12/15/2013 1:45 PM Office Visit Provider: Thompson Grayer, MD  MRN: [id number] Department: [address] [facility]             Vital Signs Most recent update: 12/15/2013 2:08 PM by Sandford Craze, CMA     BP Pulse Ht Wt BMI    132/96 110 5' 10.5" (1.791 m) 185 lb 12.8 oz (84.278 kg) 26.27 kg/m2              Progress Notes     Thompson Grayer, MD at 12/15/2013 4:26 PM     Status: Signed        PCP: [name], MD  [place] II is a 48 y.o. male who presents today for routine electrophysiology followup. Since his recent afib ablation, the patient reports doing very well. He denies procedure related complications. He has had ERAF with minimal symptoms. Today, he denies symptoms of palpitations, chest pain, shortness of breath, lower extremity edema, dizziness, presyncope, or syncope. The patient is otherwise without complaint today.      Past Medical History      Diagnosis  Date      .  Atrial fibrillation         first diagnosed 2011; s/p DCCV at that time; PVI 10-2013      .  Hyperlipidemia       .  Hypertension       .  Sinusitis       .  Headache       Past Surgical History      Procedure  Laterality  Date      .  Cardioversion          Hx of      .  Appendectomy   1975      .  Rhinoplasty   2006        Dr. Nadeen Landau, [facility]      .  Knee surgery   2009        [facility]      .  Hernia repair          1.30.15      .  Cardioversion  N/A  10/15/2013        Procedure: CARDIOVERSION; Surgeon: Fay Records, MD; Location: [place]; Service: Cardiovascular; Laterality: N/A;      .  Tee without cardioversion  N/A  11/01/2013        Procedure: TRANSESOPHAGEAL ECHOCARDIOGRAM (TEE); Surgeon: Candee Furbish, MD; Location: [facility] ENDOSCOPY; Service: Cardiovascular; Laterality: N/A;      .  Ablation   11-02-2013        PVI by Dr Rayann Heman      ROS- all systems are reviewed and negatives except as  per HPI above      Current Outpatient Prescriptions      Medication  Sig  Dispense  Refill      .  amiodarone (PACERONE) 200 MG tablet  200mg  by mouth twice daily for 7 days then 200mg  daily thereafter  180 tablet  3      .  atorvastatin (LIPITOR) 10 MG tablet  Take 10 mg by mouth daily.        Marland Kitchen  diltiazem (CARDIZEM CD) 240 MG 24 hr capsule  Take 1 capsule (240 mg total) by mouth daily.  30 capsule  3      .  pantoprazole (PROTONIX) 40 MG tablet  Take 1 tablet (40 mg total) by mouth daily.  60 tablet  0      .  rivaroxaban (XARELTO) 20 MG TABS tablet  Take 1 tablet (20 mg total) by mouth daily with supper.  30 tablet  11      No current facility-administered medications for this visit.       Physical Exam:       Filed Vitals:        12/15/13 1402       BP:  132/96       Pulse:  110       Height:  5' 10.5" (1.791 m)       Weight:  185 lb 12.8 oz (84.278 kg)       GEN- The patient is well appearing, alert and oriented x 3 today.  Head- normocephalic, atraumatic  Eyes- Sclera clear, conjunctiva pink  Ears- hearing intact  Oropharynx- clear  Lungs- Clear to ausculation bilaterally, normal work of breathing  Heart- irregular rate and rhythm, no murmurs, rubs or gallops, PMI not laterally displaced  GI- soft, NT, ND, + BS  Extremities- no clubbing, cyanosis, or edema  ekg reveals afib, V rate 110 bpm  Assessment and Plan:  1. afib  ERAF post ablation  Will return next week. If still in afib, will need cardioversion. Continue anticoagulation and amiodarone for now  Risks, benefits, and alternatives to cardioversion were discussed with the patient who wishes to proceed.  2. HTN Stable  No change required today  Return to see me in 6 weeks     For DCCV; no changes Kirk Ruths

## 2014-01-26 ENCOUNTER — Ambulatory Visit: Payer: BC Managed Care – PPO | Admitting: Internal Medicine

## 2014-02-02 ENCOUNTER — Ambulatory Visit (INDEPENDENT_AMBULATORY_CARE_PROVIDER_SITE_OTHER): Payer: BC Managed Care – PPO | Admitting: Internal Medicine

## 2014-02-02 ENCOUNTER — Encounter: Payer: Self-pay | Admitting: Internal Medicine

## 2014-02-02 VITALS — BP 130/78 | HR 125 | Ht 70.0 in | Wt 185.0 lb

## 2014-02-02 DIAGNOSIS — I481 Persistent atrial fibrillation: Secondary | ICD-10-CM

## 2014-02-02 DIAGNOSIS — I4819 Other persistent atrial fibrillation: Secondary | ICD-10-CM

## 2014-02-02 DIAGNOSIS — I10 Essential (primary) hypertension: Secondary | ICD-10-CM

## 2014-02-02 NOTE — Progress Notes (Signed)
PCP: [name], MD  Jorge [name] is a 48 y.o. male who presents today for routine electrophysiology followup.  Since his recent afib ablation, the patient reports doing very well.   He denies procedure related complications.  He has had ERAF with minimal symptoms.  He has occasional palpitations.  His exercise tolerance is preserved.  He thinks that he has been in sinus for the last 2 weeks but is back in AF today despite amiodarone therapy.  He is just now at the end of his 3 month healing phase.  Today, he denies symptoms of chest pain, shortness of breath,  lower extremity edema, dizziness, presyncope, or syncope.  The patient is otherwise without complaint today.   Past Medical History  Diagnosis Date  . Atrial fibrillation     first diagnosed 2011; s/p DCCV at that time; PVI 10-2013  . Hyperlipidemia   . Hypertension   . Sinusitis   . Headache    Past Surgical History  Procedure Laterality Date  . Cardioversion      Hx of  . Appendectomy  1975  . Rhinoplasty  2006    Dr. Nadeen Landau, [facility]  . Knee surgery  2009    [facility]  . Hernia repair      1.30.15  . Cardioversion N/A 10/15/2013    Procedure: CARDIOVERSION;  Surgeon: Fay Records, MD;  Location: [place];  Service: Cardiovascular;  Laterality: N/A;  . Tee without cardioversion N/A 11/01/2013    Procedure: TRANSESOPHAGEAL ECHOCARDIOGRAM (TEE);  Surgeon: Candee Furbish, MD;  Location: [facility] ENDOSCOPY;  Service: Cardiovascular;  Laterality: N/A;  . Ablation  11-02-2013    PVI by Dr Rayann Heman    ROS- all systems are reviewed and negatives except as per HPI above  Current Outpatient Prescriptions  Medication Sig Dispense Refill  . amiodarone (PACERONE) 200 MG tablet Take 200 mg by mouth daily.      Marland Kitchen atorvastatin (LIPITOR) 10 MG tablet Take 10 mg by mouth daily.      . rivaroxaban (XARELTO) 20 MG TABS tablet Take 1 tablet (20 mg total) by mouth daily with supper.  30 tablet  11   No current  facility-administered medications for this visit.    Physical Exam: Filed Vitals:   02/02/14 1537  BP: 130/78  Pulse: 125  Height: 5\' 10"  (1.778 m)  Weight: 185 lb (83.915 kg)    GEN- The patient is well appearing, alert and oriented x 3 today.   Head- normocephalic, atraumatic Eyes-  Sclera clear, conjunctiva pink Ears- hearing intact Oropharynx- clear Lungs- Clear to ausculation bilaterally, normal work of breathing Heart- irregular rate and rhythm, no murmurs, rubs or gallops, PMI not laterally displaced GI- soft, NT, ND, + BS Extremities- no clubbing, cyanosis, or edema  ekg reveals afib, V rate 125 bpm  Assessment and Plan:  1. afib ERAF post ablation.  As he is now completing 3 months of healing and continues to have AF on amiodarone, I suspect that additional ablation will be required. Therapeutic strategies for afib including medicine and repeat ablation were discussed in detail with the patient today. Risk, benefits, and alternatives to EP study and radiofrequency ablation for afib were also discussed in detail today. These risks include but are not limited to stroke, bleeding, vascular damage, tamponade, perforation, damage to the esophagus, lungs, and other structures, pulmonary vein stenosis, worsening renal function, and death. The patient understands these risk and wishes to proceed.  I will schedule ablation  for 4-6 weeks from now.  If his AF resolves over this time frame then we will cancel the procedure.  The importance of compliance with anticoagulation was stressed today.  2. HTN Stable No change required today

## 2014-02-02 NOTE — Patient Instructions (Signed)
Your physician has recommended that you have an ablation. Catheter ablation is a medical procedure used to treat some cardiac arrhythmias (irregular heartbeats). During catheter ablation, a long, thin, flexible tube is put into a blood vessel in your groin (upper thigh), or neck. This tube is called an ablation catheter. It is then guided to your heart through the blood vessel. Radio frequency waves destroy small areas of heart tissue where abnormal heartbeats may cause an arrhythmia to start. Please see the instruction sheet given to you today.   Ablation ---03/29/14 TEE--03/28/14 Labs 03/22/14 at 7:30am

## 2014-03-04 ENCOUNTER — Encounter: Payer: Self-pay | Admitting: *Deleted

## 2014-03-04 ENCOUNTER — Other Ambulatory Visit: Payer: Self-pay | Admitting: *Deleted

## 2014-03-04 DIAGNOSIS — I4819 Other persistent atrial fibrillation: Secondary | ICD-10-CM

## 2014-03-16 ENCOUNTER — Telehealth: Payer: Self-pay | Admitting: Internal Medicine

## 2014-03-16 NOTE — Telephone Encounter (Addendum)
Called patient back and let him know he is scheduled for labs on Monday morning at 7:30am

## 2014-03-16 NOTE — Telephone Encounter (Signed)
New message  ° ° °Returning call back to nurse.  °

## 2014-03-21 ENCOUNTER — Other Ambulatory Visit (INDEPENDENT_AMBULATORY_CARE_PROVIDER_SITE_OTHER): Payer: BC Managed Care – PPO | Admitting: *Deleted

## 2014-03-21 DIAGNOSIS — I4819 Other persistent atrial fibrillation: Secondary | ICD-10-CM

## 2014-03-21 DIAGNOSIS — I481 Persistent atrial fibrillation: Secondary | ICD-10-CM

## 2014-03-21 LAB — CBC WITH DIFFERENTIAL/PLATELET
Basophils Absolute: 0 10*3/uL (ref 0.0–0.1)
Basophils Relative: 0.3 % (ref 0.0–3.0)
EOS PCT: 2.6 % (ref 0.0–5.0)
Eosinophils Absolute: 0.2 10*3/uL (ref 0.0–0.7)
HEMATOCRIT: 39.9 % (ref 39.0–52.0)
HEMOGLOBIN: 13.3 g/dL (ref 13.0–17.0)
LYMPHS ABS: 1.9 10*3/uL (ref 0.7–4.0)
Lymphocytes Relative: 24.9 % (ref 12.0–46.0)
MCHC: 33.3 g/dL (ref 30.0–36.0)
MCV: 90.4 fl (ref 78.0–100.0)
MONOS PCT: 6.8 % (ref 3.0–12.0)
Monocytes Absolute: 0.5 10*3/uL (ref 0.1–1.0)
NEUTROS ABS: 5 10*3/uL (ref 1.4–7.7)
Neutrophils Relative %: 65.4 % (ref 43.0–77.0)
Platelets: 272 10*3/uL (ref 150.0–400.0)
RBC: 4.41 Mil/uL (ref 4.22–5.81)
RDW: 12.6 % (ref 11.5–15.5)
WBC: 7.6 10*3/uL (ref 4.0–10.5)

## 2014-03-22 LAB — BASIC METABOLIC PANEL
BUN: 18 mg/dL (ref 6–23)
CO2: 26 mEq/L (ref 19–32)
Calcium: 9 mg/dL (ref 8.4–10.5)
Chloride: 103 mEq/L (ref 96–112)
Creatinine, Ser: 0.9 mg/dL (ref 0.4–1.5)
GFR: 97.99 mL/min (ref >60.00)
Glucose, Bld: 86 mg/dL (ref 70–99)
POTASSIUM: 4 meq/L (ref 3.5–5.1)
SODIUM: 139 meq/L (ref 135–145)

## 2014-03-28 ENCOUNTER — Ambulatory Visit (HOSPITAL_COMMUNITY)
Admission: RE | Admit: 2014-03-28 | Discharge: 2014-03-28 | Disposition: A | Discharge status: Home / Self Care | Payer: BC Managed Care – PPO | Source: Ambulatory Visit | Attending: Cardiology | Admitting: Cardiology

## 2014-03-28 ENCOUNTER — Encounter (HOSPITAL_COMMUNITY): Payer: Self-pay | Admitting: *Deleted

## 2014-03-28 ENCOUNTER — Encounter (HOSPITAL_COMMUNITY)
Admission: RE | Disposition: A | Discharge status: Home / Self Care | Payer: Self-pay | Source: Ambulatory Visit | Attending: Cardiology

## 2014-03-28 DIAGNOSIS — E785 Hyperlipidemia, unspecified: Secondary | ICD-10-CM | POA: Insufficient documentation

## 2014-03-28 DIAGNOSIS — I4819 Other persistent atrial fibrillation: Secondary | ICD-10-CM

## 2014-03-28 DIAGNOSIS — I481 Persistent atrial fibrillation: Secondary | ICD-10-CM | POA: Diagnosis not present

## 2014-03-28 DIAGNOSIS — I351 Nonrheumatic aortic (valve) insufficiency: Secondary | ICD-10-CM

## 2014-03-28 DIAGNOSIS — I080 Rheumatic disorders of both mitral and aortic valves: Secondary | ICD-10-CM | POA: Insufficient documentation

## 2014-03-28 DIAGNOSIS — Z7901 Long term (current) use of anticoagulants: Secondary | ICD-10-CM | POA: Insufficient documentation

## 2014-03-28 DIAGNOSIS — I10 Essential (primary) hypertension: Secondary | ICD-10-CM | POA: Insufficient documentation

## 2014-03-28 HISTORY — PX: TEE WITHOUT CARDIOVERSION: SHX5443

## 2014-03-28 SURGERY — ECHOCARDIOGRAM, TRANSESOPHAGEAL
Anesthesia: Moderate Sedation

## 2014-03-28 MED ORDER — SODIUM CHLORIDE 0.9 % IV SOLN
INTRAVENOUS | Status: DC
  Administered 2014-03-28: 07:00:00 via INTRAVENOUS

## 2014-03-28 MED ORDER — FENTANYL CITRATE 0.05 MG/ML IJ SOLN
INTRAMUSCULAR | Status: AC
  Filled 2014-03-28: qty 2

## 2014-03-28 MED ORDER — MIDAZOLAM HCL 5 MG/ML IJ SOLN
INTRAMUSCULAR | Status: AC
  Filled 2014-03-28: qty 2

## 2014-03-28 MED ORDER — BUTAMBEN-TETRACAINE-BENZOCAINE 2-2-14 % EX AERO
INHALATION_SPRAY | CUTANEOUS | Status: DC | PRN
  Administered 2014-03-28: 2 via TOPICAL

## 2014-03-28 MED ORDER — MIDAZOLAM HCL 10 MG/2ML IJ SOLN
INTRAMUSCULAR | Status: DC | PRN
  Administered 2014-03-28: 1 mg via INTRAVENOUS
  Administered 2014-03-28 (×2): 2 mg via INTRAVENOUS

## 2014-03-28 MED ORDER — DIPHENHYDRAMINE HCL 50 MG/ML IJ SOLN
INTRAMUSCULAR | Status: AC
  Filled 2014-03-28: qty 1

## 2014-03-28 MED ORDER — FENTANYL CITRATE 0.05 MG/ML IJ SOLN
INTRAMUSCULAR | Status: DC | PRN
  Administered 2014-03-28 (×2): 25 ug via INTRAVENOUS

## 2014-03-28 NOTE — CV Procedure (Signed)
Procedure: TEE  Indication: Atrial fibrillation, pre-ablation.  On Xarelto.   Sedation: Versed 5 mg IV, Fentanyl 50 mcg IV  Findings: Please see echo section for full report.  The patient was in atrial fibrillation during the study.  Normal LV size, EF 55%.  Normal RV size and systolic function.  Trivial MR.  Mild AI.  Mild LAE with no LAA thrombus.  Negative bubble study.    May proceed with ablation tomorrrow.   [name] 03/28/2014 8:37 AM

## 2014-03-28 NOTE — H&P (Signed)
PCP: Jorge Post, MD  Jorge [name] is a 48 y.o. male who presents today for routine electrophysiology followup. Since his recent afib ablation, the patient reports doing very well. He denies procedure related complications. He has had ERAF with minimal symptoms. He has occasional palpitations. His exercise tolerance is preserved. He thinks that he has been in sinus for the last 2 weeks but is back in AF today despite amiodarone therapy. He is just now at the end of his 3 month healing phase. Today, he denies symptoms of chest pain, shortness of breath, lower extremity edema, dizziness, presyncope, or syncope. The patient is otherwise without complaint today.   Past Medical History  Diagnosis Date  . Atrial fibrillation     first diagnosed 2011; s/p DCCV at that time; PVI 10-2013  . Hyperlipidemia   . Hypertension   . Sinusitis   . Headache    Past Surgical History  Procedure Laterality Date  . Cardioversion      Hx of  . Appendectomy  1975  . Rhinoplasty  2006    Dr. Nadeen Landau, [facility]  . Knee surgery  2009    [facility]  . Hernia repair      1.30.15  . Cardioversion N/A 10/15/2013    Procedure: CARDIOVERSION; Surgeon: Fay Records, MD; Location: [place]; Service: Cardiovascular; Laterality: N/A;  . Tee without cardioversion N/A 11/01/2013    Procedure: TRANSESOPHAGEAL ECHOCARDIOGRAM (TEE); Surgeon: Candee Furbish, MD; Location: [facility] ENDOSCOPY; Service: Cardiovascular; Laterality: N/A;  . Ablation  11-02-2013    PVI by Dr Rayann Heman    ROS- all systems are reviewed and negatives except as per HPI above  Current Outpatient Prescriptions  Medication Sig Dispense Refill  . amiodarone (PACERONE) 200 MG tablet Take 200 mg by mouth daily.    Marland Kitchen atorvastatin (LIPITOR) 10 MG tablet Take 10 mg by mouth daily.    . rivaroxaban (XARELTO) 20  MG TABS tablet Take 1 tablet (20 mg total) by mouth daily with supper. 30 tablet 11   No current facility-administered medications for this visit.    Physical Exam: Filed Vitals:   02/02/14 1537  BP: 130/78  Pulse: 125  Height: 5\' 10"  (1.778 m)  Weight: 185 lb (83.915 kg)    GEN- The patient is well appearing, alert and oriented x 3 today.  Head- normocephalic, atraumatic Eyes- Sclera clear, conjunctiva pink Ears- hearing intact Oropharynx- clear Lungs- Clear to ausculation bilaterally, normal work of breathing Heart- irregular rate and rhythm, no murmurs, rubs or gallops, PMI not laterally displaced GI- soft, NT, ND, + BS Extremities- no clubbing, cyanosis, or edema  ekg reveals afib, V rate 125 bpm  Assessment and Plan:  1. afib ERAF [name] ablation. As he is now completing 3 months of healing and continues to have AF on amiodarone, I suspect that additional ablation will be required. Therapeutic strategies for afib including medicine and repeat ablation were discussed in detail with the patient today. Risk, benefits, and alternatives to EP study and radiofrequency ablation for afib were also discussed in detail today. These risks include but are not limited to stroke, bleeding, vascular damage, tamponade, perforation, damage to the esophagus, lungs, and other structures, pulmonary vein stenosis, worsening renal function, and death. The patient understands these risk and wishes to proceed. I will schedule ablation for 4-6 weeks from now. If his AF resolves over this time frame then we will cancel the procedure. The importance of compliance with anticoagulation was stressed today.  2. HTN Stable No change required today  No changes from Dr. Jackalyn Lombard note above.  Plan for TEE today pre-atrial fibrillation ablation.   [name] 03/28/2014 8:18 AM

## 2014-03-28 NOTE — Discharge Instructions (Signed)
Transesophageal Echocardiogram °Transesophageal echocardiography (TEE) is a special type of test that produces images of the heart by using sound waves (echocardiogram). This type of echocardiography can obtain better images of the heart than standard echocardiography. TEE is done by passing a flexible tube down the esophagus. The heart is located in front of the esophagus. Because the heart and esophagus are close to one another, your health care provider can take very clear, detailed pictures of the heart via ultrasound waves. °TEE may be done: °· If your health care provider needs more information based on standard echocardiography findings. °· If you had a stroke. This might have happened because a clot formed in your heart. TEE can visualize different areas of the heart and check for clots. °· To check valve anatomy and function. °· To check for infection on the inside of your heart (endocarditis). °· To evaluate the dividing wall (septum) of the heart and presence of a hole that did not close after birth (patent foramen ovale or atrial septal defect). °· To help diagnose a tear in the wall of the aorta (aortic dissection). °· During cardiac valve surgery. This allows the surgeon to assess the valve repair before closing the chest. °· During a variety of other cardiac procedures to guide positioning of catheters. °· Sometimes before a cardioversion, which is a shock to convert heart rhythm back to normal. °LET YOUR HEALTH CARE PROVIDER KNOW ABOUT:  °· Any allergies you have. °· All medicines you are taking, including vitamins, herbs, eye drops, creams, and over-the-counter medicines. °· Previous problems you or members of your family have had with the use of anesthetics. °· Any blood disorders you have. °· Previous surgeries you have had. °· Medical conditions you have. °· Swallowing difficulties. °· An esophageal obstruction. °RISKS AND COMPLICATIONS  °Generally, TEE is a safe procedure. However, as with any  procedure, complications can occur. Possible complications include an esophageal tear (rupture). °BEFORE THE PROCEDURE  °· Do not eat or drink for 6 hours before the procedure or as directed by your health care provider. °· Arrange for someone to drive you home after the procedure. Do not drive yourself home. During the procedure, you will be given medicines that can continue to make you feel drowsy and can impair your reflexes. °· An IV access tube will be started in the arm. °PROCEDURE  °· A medicine to help you relax (sedative) will be given through the IV access tube. °· A medicine may be sprayed or gargled to numb the back of the throat. °· Your blood pressure, heart rate, and breathing (vital signs) will be monitored during the procedure. °· The TEE probe is a long, flexible tube. The tip of the probe is placed into the back of the mouth, and you will be asked to swallow. This helps to pass the tip of the probe into the esophagus. Once the tip of the probe is in the correct area, your health care provider can take pictures of the heart. °· TEE is usually not a painful procedure. You may feel the probe press against the back of the throat. The probe does not enter the trachea and does not affect your breathing. °AFTER THE PROCEDURE  °· You will be in bed, resting, until you have fully returned to consciousness. °· When you first awaken, your throat may feel slightly sore and will probably still feel numb. This will improve slowly over time. °· You will not be allowed to eat or drink until it   is clear that the numbness has improved. °· Once you have been able to drink, urinate, and sit on the edge of the bed without feeling sick to your stomach (nausea) or dizzy, you may be cleared to go home. °· You should have a friend or family member with you for the next 24 hours after your procedure. °Document Released: 07/06/2002 Document Revised: 04/20/2013 Document Reviewed: 10/15/2012 °[employer]® Patient Information  ©2015 [employer]. This information is not intended to replace advice given to you by your health care provider. Make sure you discuss any questions you have with your health care provider. ° °

## 2014-03-28 NOTE — Progress Notes (Deleted)
  Echocardiogram 2D Echocardiogram has been performed.  Jorge [name] 03/28/2014, 8:42 AM

## 2014-03-28 NOTE — Progress Notes (Signed)
  Echocardiogram Echocardiogram Transesophageal has been performed.  [name] 03/28/2014, 8:42 AM

## 2014-03-29 ENCOUNTER — Ambulatory Visit (HOSPITAL_COMMUNITY): Payer: BC Managed Care – PPO | Admitting: Anesthesiology

## 2014-03-29 ENCOUNTER — Ambulatory Visit (HOSPITAL_COMMUNITY)
Admission: RE | Admit: 2014-03-29 | Discharge: 2014-03-30 | Disposition: A | Discharge status: Home / Self Care | Payer: BC Managed Care – PPO | Source: Ambulatory Visit | Attending: Internal Medicine | Admitting: Internal Medicine

## 2014-03-29 ENCOUNTER — Encounter (HOSPITAL_COMMUNITY)
Admission: RE | Disposition: A | Discharge status: Home / Self Care | Payer: BC Managed Care – PPO | Source: Ambulatory Visit | Attending: Internal Medicine

## 2014-03-29 ENCOUNTER — Encounter (HOSPITAL_COMMUNITY): Payer: Self-pay | Admitting: Cardiology

## 2014-03-29 DIAGNOSIS — Z79899 Other long term (current) drug therapy: Secondary | ICD-10-CM | POA: Diagnosis not present

## 2014-03-29 DIAGNOSIS — E785 Hyperlipidemia, unspecified: Secondary | ICD-10-CM | POA: Insufficient documentation

## 2014-03-29 DIAGNOSIS — I4891 Unspecified atrial fibrillation: Secondary | ICD-10-CM | POA: Diagnosis present

## 2014-03-29 DIAGNOSIS — I10 Essential (primary) hypertension: Secondary | ICD-10-CM | POA: Insufficient documentation

## 2014-03-29 DIAGNOSIS — I481 Persistent atrial fibrillation: Secondary | ICD-10-CM | POA: Insufficient documentation

## 2014-03-29 DIAGNOSIS — I480 Paroxysmal atrial fibrillation: Secondary | ICD-10-CM | POA: Diagnosis present

## 2014-03-29 DIAGNOSIS — Z7901 Long term (current) use of anticoagulants: Secondary | ICD-10-CM | POA: Diagnosis not present

## 2014-03-29 DIAGNOSIS — I4892 Unspecified atrial flutter: Secondary | ICD-10-CM

## 2014-03-29 HISTORY — PX: ATRIAL FIBRILLATION ABLATION: SHX5456

## 2014-03-29 LAB — MRSA PCR SCREENING: MRSA by PCR: NEGATIVE

## 2014-03-29 LAB — POCT ACTIVATED CLOTTING TIME
ACTIVATED CLOTTING TIME: 245 s
ACTIVATED CLOTTING TIME: 140 s
Activated Clotting Time: 270 seconds
Activated Clotting Time: 263 seconds

## 2014-03-29 SURGERY — ATRIAL FIBRILLATION ABLATION
Anesthesia: Monitor Anesthesia Care

## 2014-03-29 MED ORDER — FENTANYL CITRATE 0.05 MG/ML IJ SOLN
12.5000 ug | Freq: Once | INTRAMUSCULAR | Status: AC
Start: 2014-03-29 — End: 2014-03-29
  Administered 2014-03-29 (×2): 12.5 ug via INTRAVENOUS

## 2014-03-29 MED ORDER — SODIUM CHLORIDE 0.9 % IV SOLN
INTRAVENOUS | Status: DC | PRN
  Administered 2014-03-29 (×2): via INTRAVENOUS

## 2014-03-29 MED ORDER — PROPOFOL INFUSION 10 MG/ML OPTIME
INTRAVENOUS | Status: DC | PRN
  Administered 2014-03-29: 100 ug/kg/min via INTRAVENOUS

## 2014-03-29 MED ORDER — MIDAZOLAM HCL 5 MG/5ML IJ SOLN
INTRAMUSCULAR | Status: DC | PRN
  Administered 2014-03-29: 2 mg via INTRAVENOUS

## 2014-03-29 MED ORDER — FENTANYL CITRATE 0.05 MG/ML IJ SOLN
INTRAMUSCULAR | Status: AC
  Filled 2014-03-29: qty 2

## 2014-03-29 MED ORDER — HEPARIN SODIUM (PORCINE) 1000 UNIT/ML IJ SOLN
INTRAMUSCULAR | Status: DC | PRN
  Administered 2014-03-29: 3000 [IU] via INTRAVENOUS

## 2014-03-29 MED ORDER — RIVAROXABAN 20 MG PO TABS
20.0000 mg | ORAL_TABLET | Freq: Every day | ORAL | Status: DC
  Administered 2014-03-29: 20 mg via ORAL
  Filled 2014-03-29 (×2): qty 1

## 2014-03-29 MED ORDER — PROTAMINE SULFATE 10 MG/ML IV SOLN
INTRAVENOUS | Status: DC | PRN
  Administered 2014-03-29: 20 mg via INTRAVENOUS
  Administered 2014-03-29: 10 mg via INTRAVENOUS

## 2014-03-29 MED ORDER — SODIUM CHLORIDE 0.9 % IV SOLN: 250.0000 mL | INTRAVENOUS | Status: DC | PRN

## 2014-03-29 MED ORDER — SODIUM CHLORIDE 0.9 % IJ SOLN: 3.0000 mL | INTRAMUSCULAR | Status: DC | PRN

## 2014-03-29 MED ORDER — BUPIVACAINE HCL (PF) 0.25 % IJ SOLN
INTRAMUSCULAR | Status: AC
  Filled 2014-03-29: qty 30

## 2014-03-29 MED ORDER — FENTANYL CITRATE 0.05 MG/ML IJ SOLN
INTRAMUSCULAR | Status: DC | PRN
  Administered 2014-03-29: 100 ug via INTRAVENOUS
  Administered 2014-03-29: 50 ug via INTRAVENOUS
  Administered 2014-03-29: 100 ug via INTRAVENOUS

## 2014-03-29 MED ORDER — HEPARIN SODIUM (PORCINE) 1000 UNIT/ML IJ SOLN
INTRAMUSCULAR | Status: AC
  Filled 2014-03-29: qty 1

## 2014-03-29 MED ORDER — ONDANSETRON HCL 4 MG/2ML IJ SOLN: 4.0000 mg | Freq: Four times a day (QID) | INTRAMUSCULAR | Status: DC | PRN

## 2014-03-29 MED ORDER — ISOPROTERENOL HCL 0.2 MG/ML IJ SOLN
2.0000 ug/min | INTRAMUSCULAR | Status: AC
  Administered 2014-03-29: 5 ug/min via INTRAVENOUS
  Filled 2014-03-29: qty 2

## 2014-03-29 MED ORDER — ADENOSINE 6 MG/2ML IV SOLN
INTRAVENOUS | Status: AC
  Filled 2014-03-29: qty 16

## 2014-03-29 MED ORDER — SODIUM CHLORIDE 0.9 % IJ SOLN
3.0000 mL | Freq: Two times a day (BID) | INTRAMUSCULAR | Status: DC
  Administered 2014-03-29 (×2): 3 mL via INTRAVENOUS

## 2014-03-29 MED ORDER — ACETAMINOPHEN 325 MG PO TABS: 650.0000 mg | ORAL_TABLET | ORAL | Status: DC | PRN

## 2014-03-29 MED ORDER — LIDOCAINE HCL (CARDIAC) 20 MG/ML IV SOLN
INTRAVENOUS | Status: DC | PRN
  Administered 2014-03-29: 40 mg via INTRAVENOUS

## 2014-03-29 NOTE — Progress Notes (Signed)
Site area: rt groin Site Prior to Removal:  Level 0 Pressure Applied For: 20 minutes Manual:   yes Patient Status During Pull:  stable Post Pull Site:  Level 0 Post Pull Instructions Given:  yes Post Pull Pulses Present: yes Dressing Applied:  V-Pad dressing Bedrest begins @ 1117 Comments: no complications

## 2014-03-29 NOTE — Plan of Care (Signed)
Problem: Consults Goal: EP Study Patient Education (See Patient Education module for education specifics.)  Outcome: Completed/Met Date Met:  03/29/14 Goal: Skin Care Protocol Initiated - if Braden Score 18 or less If consults are not indicated, leave blank or document N/A  Outcome: Not Applicable Date Met:  01/72/41 Goal: Nutrition Consult-if indicated Outcome: Not Applicable Date Met:  95/42/48 Goal: Diabetes Guidelines if Diabetic/Glucose > 140 If diabetic or lab glucose is > 140 mg/dl - Initiate Diabetes/Hyperglycemia Guidelines & Document Interventions  Outcome: Not Applicable Date Met:  14/43/92  Problem: Phase I Progression Outcomes Goal: Hemostasis of puncture sites Outcome: Progressing

## 2014-03-29 NOTE — Anesthesia Preprocedure Evaluation (Addendum)
Anesthesia Evaluation  Patient identified by MRN, date of birth, ID band Patient awake    Reviewed: Allergy & Precautions, H&P , NPO status , Patient's Chart, lab work & pertinent test results  History of Anesthesia Complications Negative for: history of anesthetic complications  Airway Mallampati: II  TM Distance: >3 FB Neck ROM: Full    Dental  (+) Teeth Intact, Dental Advisory Given   Pulmonary neg pulmonary ROS,    Pulmonary exam normal       Cardiovascular hypertension, Pt. on medications + dysrhythmias     Neuro/Psych  Headaches, negative psych ROS   GI/Hepatic negative GI ROS, Neg liver ROS,   Endo/Other  negative endocrine ROS  Renal/GU negative Renal ROS     Musculoskeletal   Abdominal   Peds  Hematology   Anesthesia Other Findings   Reproductive/Obstetrics                            Anesthesia Physical Anesthesia Plan  ASA: II  Anesthesia Plan: MAC   Post-op Pain Management:    Induction: Intravenous  Airway Management Planned: Simple Face Mask  Additional Equipment:   Intra-op Plan:   Post-operative Plan: Extubation in OR  Informed Consent: I have reviewed the patients History and Physical, chart, labs and discussed the procedure including the risks, benefits and alternatives for the proposed anesthesia with the patient or authorized representative who has indicated his/her understanding and acceptance.   Dental advisory given  Plan Discussed with: CRNA, Anesthesiologist and Surgeon  Anesthesia Plan Comments:        Anesthesia Quick Evaluation

## 2014-03-29 NOTE — H&P (Signed)
  PCP: [name], MD  Jorge [name] is a 48 y.o. male who presents today for repeat afib ablation.  He is s/p ablation 7/15.  Though he did well initially with ablation, he has continued to have episodic afib. Today, he denies symptoms of chest pain, shortness of breath, lower extremity edema, dizziness, presyncope, or syncope. The patient is otherwise without complaint today.   Past Medical History  Diagnosis Date  . Atrial fibrillation     first diagnosed 2011; s/p DCCV at that time; PVI 10-2013  . Hyperlipidemia   . Hypertension   . Sinusitis   . Headache    Past Surgical History  Procedure Laterality Date  . Cardioversion      Hx of  . Appendectomy  1975  . Rhinoplasty  2006    Dr. Nadeen Landau, [facility]  . Knee surgery  2009    [facility]  . Hernia repair      1.30.15  . Cardioversion N/A 10/15/2013    Procedure: CARDIOVERSION; Surgeon: Fay Records, MD; Location: [place]; Service: Cardiovascular; Laterality: N/A;  . Tee without cardioversion N/A 11/01/2013    Procedure: TRANSESOPHAGEAL ECHOCARDIOGRAM (TEE); Surgeon: Candee Furbish, MD; Location: [facility] ENDOSCOPY; Service: Cardiovascular; Laterality: N/A;  . Ablation  11-02-2013    PVI by Dr Rayann Heman    ROS- all systems are reviewed and negatives except as per HPI above  Current Outpatient Prescriptions  Medication Sig Dispense Refill  . amiodarone (PACERONE) 200 MG tablet Take 200 mg by mouth daily.    Marland Kitchen atorvastatin (LIPITOR) 10 MG tablet Take 10 mg by mouth daily.    . rivaroxaban (XARELTO) 20 MG TABS tablet Take 1 tablet (20 mg total) by mouth daily with supper. 30 tablet 11   No current facility-administered medications for this visit.    Physical Exam: Filed Vitals:   02/02/14 1537  BP: 130/78  Pulse: 125  Height: 5\' 10"  (1.778 m)  Weight: 185 lb  (83.915 kg)    GEN- The patient is well appearing, alert and oriented x 3 today.  Head- normocephalic, atraumatic Eyes- Sclera clear, conjunctiva pink Ears- hearing intact Oropharynx- clear Lungs- Clear to ausculation bilaterally, normal work of breathing Heart- regular rate and rhythm, no murmurs, rubs or gallops, PMI not laterally displaced GI- soft, NT, ND, + BS Extremities- no clubbing, cyanosis, or edema  TEE 11/30 is reviewed  Assessment and Plan:  1. afib Ongoing AF despite amiodarone s/p ablation. Therapeutic strategies for afib including medicine and repeat ablation were discussed in detail with the patient today. Risk, benefits, and alternatives to EP study and radiofrequency ablation for afib were also discussed in detail today. These risks include but are not limited to stroke, bleeding, vascular damage, tamponade, perforation, damage to the esophagus, lungs, and other structures, pulmonary vein stenosis, worsening renal function, and death. The patient understands these risk and wishes to proceed.  2. HTN Stable No change required today

## 2014-03-29 NOTE — Progress Notes (Signed)
At 1306 patient coughed, and right femoral site rebled.  Manual pressure applied to arterial site for 20 minutes and to venous site for 15 minutes.  Patient remained stable.  Site level 0 .  Patient reeducated about holding pressure to site if coughing or sneezing, not lifting head off of pillow, and not moving right leg during the next 6 hours of bedrest.  Patient was able to restate teaching to RN.   Gauze dressing applied to site.

## 2014-03-29 NOTE — Op Note (Signed)
SURGEON: Thompson Grayer, MD  PREPROCEDURE DIAGNOSES: 1. Persistent atrial fibrillation.  POSTPROCEDURE DIAGNOSES: 1. Persistent atrial fibrillation.  PROCEDURES: 1. Comprehensive electrophysiologic study. 2. Coronary sinus pacing and recording. 3. Three-dimensional mapping of atrial fibrillation (with additional mapping and ablation of right atrial flutter) 4. Ablation of atrial fibrillation (with additional mapping and ablation of right atrial flutter) 5. Intracardiac echocardiography. 6. Transseptal puncture of an intact septum. 7. Rotational Angiography with processing at an independent workstation 8. Arrhythmia induction with pacing 9. Isuprel infusion  INTRODUCTION: Jorge [name] is a 48 y.o. male with a history of persistent atrial fibrillation who now presents for EP study and radiofrequency ablation. The patient reports initially being diagnosed with atrial fibrillation after presenting with symptomatic palpitations and fatgiue. The patient has failed medical therapy with Multaq, metoprolol, amiodarone and flecainide.  He underwent afib ablation by me 7/15 but continues to have frequent episodes of atrial fibrillation. The patient therefore presents today for repeat catheter ablation of atrial fibrillation.  DESCRIPTION OF PROCEDURE: Informed written consent was obtained, and the patient was brought to the electrophysiology lab in a fasting state. The patient was adequately sedated with intravenous medications as outlined in the anesthesia report. The patient's left and right groins were prepped and draped in the usual sterile fashion by the EP lab staff. Using a percutaneous Seldinger technique, two 7-French and one 11-French hemostasis sheaths were placed into the right common femoral vein.   3 Dimensional Rotational Angiography: A 5 french pigtail catheter was introduced through the right common femoral vein and advanced into the inferior venocava. 3 demential  rotational angiography was then performed by power injection of 100cc of nonionic contrast. Reprocessing at an independent work station was then performed.  This demonstrated a moderate sized left atrium. The patient was noted to have two separate left pulmonary veins.  There was a short common ostium and then veins were then small. The right pulmonary veins were small. There was a middle right pulmonary vein as well which were also small in size. A 3 dimensional rendering of the left atrium was then merged using [employer] onto the [employer] system and registered with intracardiac echo (see below). The pigtail catheter was then removed.  Catheter Placement: A 7-French Bios[employer] Webster Decapolar coronary sinus catheter was introduced through the right common femoral vein and advanced into the coronary sinus for recording and pacing from this location. A 6-French quadripolar Josephson catheter was introduced through the right common femoral vein and advanced into the right ventricle for recording and pacing. This catheter was then pulled back to the His bundle location.   Initial Measurements: The patient presented to the electrophysiology lab in sinus rhythm.  His PR interval was 207 msec with a QRS duration of 74 msec and a Qt interval of 402 msec.   Intracardiac Echocardiography: A 10-French [employer] Webster AcuNav intracardiac echocardiography catheter was introduced through the left common femoral vein and advanced into the right atrium. Intracardiac echocardiography was performed of the left atrium, and a three-dimensional anatomical rendering of the left atrium was performed using CARTO sound technology. The patient was noted to have a moderate sized left atrium. The interatrial septum was prominent but not aneurysmal.  The patient was noted to have two separate left pulmonary veins with a short common ostium  The left sided pulmonary veins were small.  The right  pulmonary veins were small. There was a middle right pulmonary vein as well which were also small in size. The  left atrial appendage was visualized and did not reveal thrombus. There was no evidence of pulmonary vein stenosis.   Transseptal Puncture: The middle right common femoral vein sheath was exchanged for an 8.5 [place] SL2 transseptal sheath and transseptal access was achieved in a standard fashion using a Brockenbrough needle under biplane fluoroscopy with intracardiac echocardiography confirmation of the transseptal puncture. Once transseptal access had been achieved, heparin was administered intravenously and intra- arterially in order to maintain an ACT of greater than 300 seconds throughout the procedure.   3D Mapping and Ablation: The His bundle catheter was removed and in its place a 3.5 mm [employer] Thermocool ablation catheter was advanced into the right atrium. The transseptal sheath was pulled back into the IVC over a guidewire. The ablation catheter was advanced across the transseptal hole using the wire as a guide. The transseptal sheath was then re-advanced over the guidewire into the left atrium. A duodecapolar Biosense Webster circular mapping catheter was introduced through the transseptal sheath and positioned over the mouth of all 4 pulmonary veins.  Three-dimensional electroanatomical mapping was performed using CARTO technology.The right superior and right middle pulmonary veins were quiescent from the prior ablation procedure.  The right inferior pulmonary vein was also quiet (see below).  There was a small amount of conduction within the left superior and left inferior pulmonary veins.  I therefore elected to re-isolate the left superior and inferior veins using a WACA approach using radiofrequency current with a circular mapping catheter as a guide.  Isuprel was then infused up to 20 mcg/min.  This led to prodigious conduction with very frequent  monomorphic PVCs.  The PVCs were mapped to the right inferior pulmonary vein which was actually not isolated.  This was felt to be the culprit for the patient's recurrent afib.  I therefore electrically isolated the right inferior pulmonary vein using RF.  The PAC/ frequent atrial firing was successfully terminated with isolation of the right inferior pulmonary vein.  Isoprel was again infused at 20 mcg/min and allowed to wash out.  No additional atrial ectopy or arrhythmias were observed.   Each vein was then careful examined for entrance/ exit block with pacing from within and outside of each vein.  Adenosine 12mg  (x4) was then given and each pulmonary vein was again evaluated without return of conduction.  The ablation catheter was then pulled back into the right atrium and positioned along the cavo-tricuspid isthmus.  Cavo-tricuspid isthmus mapping and ablation wasb then performed in a standard fashion using the irrigated ablation catheter.  Extensive ablation was performed along the isthmus.  Measurements Following Ablation: In sinus rhythm the RR interval was 944msec, with PR 222 msec, QRS 84 msec, and Qtc 409 msec. Following ablation the AH interval measured 91 msec with an HV interval of 45 msec. Ventricular pacing was performed, which revealed VA dissociation when pacing at 600 msec.  Rapid atrial pacing was performed, which revealed an AV Wenckebach cycle length of 420 msec. Electroisolation was then again confirmed in all four pulmonary veins as above.  The procedure was therefore considered completed. All catheters were removed, and the sheaths were aspirated and flushed. The patient was transferred to the recovery area for sheath removal per protocol. A limited bedside transthoracic echocardiogram revealed no pericardial effusion. There were no early apparent complications.  CONCLUSIONS: 1. Sinus rhythm upon presentation.  2. Rotational Angiography reveals a moderate sized left  atrium with five pulmonary veins (there was a small right middle pulmonary  vein) without evidence of pulmonary vein stenosis. 3. Return of conduction was noted within the left superior and inferior pulmonary veins.  These veins were reisolated today.  The culprit pulmonary vein was felt to be the right inferior pulmonary vein as prodigious conduction was observed during isuprel infusion.  This vein was successfully isolated today 4. CTI ablation also performed 5. No inducible arrhythmias following ablation both on and off of isuprel 6. No early apparent complications.

## 2014-03-29 NOTE — Plan of Care (Signed)
Problem: Phase I Progression Outcomes Goal: Hemostasis of puncture sites Outcome: Completed/Met Date Met:  03/29/14 Goal: Pain controlled with appropriate interventions Outcome: Completed/Met Date Met:  03/29/14 Goal: Initial discharge plan identified Outcome: Completed/Met Date Met:  03/29/14 Goal: Voiding-avoid urinary catheter unless indicated Outcome: Completed/Met Date Met:  03/29/14 Goal: Hemodynamically stable Outcome: Completed/Met Date Met:  03/29/14 Goal: Other Phase I Outcomes/Goals Outcome: Not Applicable Date Met:  41/28/20

## 2014-03-29 NOTE — Transfer of Care (Signed)
Immediate Anesthesia Transfer of Care Note  Patient: Jorge [name]  Procedure(s) Performed: Procedure(s): ATRIAL FIBRILLATION ABLATION (N/A)  Patient Location: Cath Lab  Anesthesia Type:MAC  Level of Consciousness: awake, alert  and oriented  Airway & Oxygen Therapy: Patient Spontanous Breathing and Patient connected to nasal cannula oxygen  Post-op Assessment: Report given to PACU RN and Post -op Vital signs reviewed and stable  Post vital signs: Reviewed and stable  Complications: No apparent anesthesia complications

## 2014-03-29 NOTE — Discharge Instructions (Addendum)
No driving for 5 days. No lifting over 5 lbs for 1 week. No sexual activity for 1 week. You may return to work in 1 week. Keep procedure site clean & dry. If you notice increased pain, swelling, bleeding or pus, call/return!  You may shower, but no soaking baths/hot tubs/pools for 1 week.     Information on my medicine - XARELTO (Rivaroxaban)  This medication education was reviewed with me or my healthcare representative as part of my discharge preparation.    Why was Xarelto prescribed for you? Xarelto was prescribed for you to reduce the risk of a blood clot forming that can cause a stroke if you have a medical condition called atrial fibrillation (a type of irregular heartbeat).  What do you need to know about xarelto ? Take your Xarelto ONCE DAILY at the same time every day with your evening meal. If you have difficulty swallowing the tablet whole, you may crush it and mix in applesauce just prior to taking your dose.  Take Xarelto exactly as prescribed by your doctor and DO NOT stop taking Xarelto without talking to the doctor who prescribed the medication.  Stopping without other stroke prevention medication to take the place of Xarelto may increase your risk of developing a clot that causes a stroke.  Refill your prescription before you run out.  After discharge, you should have regular check-up appointments with your healthcare provider that is prescribing your Xarelto.  In the future your dose may need to be changed if your kidney function or weight changes by a significant amount.  What do you do if you miss a dose? If you are taking Xarelto ONCE DAILY and you miss a dose, take it as soon as you remember on the same day then continue your regularly scheduled once daily regimen the next day. Do not take two doses of Xarelto at the same time or on the same day.   Important Safety Information A possible side effect of Xarelto is bleeding. You should call your healthcare  provider right away if you experience any of the following: ? Bleeding from an injury or your nose that does not stop. ? Unusual colored urine (red or dark brown) or unusual colored stools (red or black). ? Unusual bruising for unknown reasons. ? A serious fall or if you hit your head (even if there is no bleeding).  Some medicines may interact with Xarelto and might increase your risk of bleeding while on Xarelto. To help avoid this, consult your healthcare provider or pharmacist prior to using any new prescription or non-prescription medications, including herbals, vitamins, non-steroidal anti-inflammatory drugs (NSAIDs) and supplements.  This website has more information on Xarelto: [email or web address]

## 2014-03-29 NOTE — Progress Notes (Signed)
Utilization Review Completed.Donne Anon T12/04/2013

## 2014-03-29 NOTE — Discharge Summary (Signed)
ELECTROPHYSIOLOGY PROCEDURE DISCHARGE SUMMARY    Patient ID: Jorge [name],  MRN: [id number], DOB/AGE: 1965/07/21 48 y.o.  Admit date: 03/29/2014 Discharge date: 03/30/2014  Primary Care Physician: [name], MD Electrophysiologist: Thompson Grayer, MD  Primary Discharge Diagnosis:  Persistent atrial fibrillation status post ablation this admission  Secondary Discharge Diagnosis:  1.  Hypertension 2.  Hyperlipidemia   Procedures This Admission:  1.  Electrophysiology study and radiofrequency catheter ablation on 03-29-14 by Dr Thompson Grayer.  This study demonstrated sinus rhythm upon presentation; rotational Angiography reveals a moderate sized left atrium with five pulmonary veins (there was a small right middle pulmonary vein) without evidence of pulmonary vein stenosis; return of conduction was noted within the left superior and inferior pulmonary veins. These veins were reisolated today. The culprit pulmonary vein was felt to be the right inferior pulmonary vein as prodigious conduction was observed during isuprel infusion. This vein was successfully isolated today; CTI ablation also performed; no inducible arrhythmias following ablation both on and off of isuprel.  There were no early apparent complications.   Brief HPI: Jorge [name] is a 48 y.o. male with a history of persistent atrial fibrillation.  He was originally diagnosed with atrial fibrillation in 2011 and has failed medical therapy with Flecainide, Multaq, and Amiodarone.  He underwent PVI in July of 2015 but had ERAF. Risks, benefits, and alternatives to repeat catheter ablation of atrial fibrillation were reviewed with the patient who wished to proceed.  The patient underwent TEE prior to the procedure which demonstrated normal LV function and no LAA thrombus.    Hospital Course:  The patient was admitted and underwent EPS/RFCA of atrial fibrillation with details as outlined above.  They were  monitored on telemetry overnight which demonstrated sinus rhythm.  Groin was without complication on the day of discharge.  The patient was examined by Dr Rayann Heman and considered to be stable for discharge.  Wound care and restrictions were reviewed with the patient.  The patient will be seen back by Dr Rayann Heman in 12 weeks for post ablation follow up.   The patient has been off amiodarone for the last week, will not plan to resume at this time. Will add Protonix 40mg  daily for 6 weeks following ablation.   Discharge Vitals:  Physical Exam: Filed Vitals:   03/30/14 0200 03/30/14 0300 03/30/14 0400 03/30/14 0735  BP: 128/82 132/87 147/90 135/92  Pulse: 69 81 72 85  Temp:    98 F (36.7 C)  TempSrc:    Oral  Resp: 15 16 14 12   Height:      Weight:      SpO2: 93% 96% 95% 98%    GEN- The patient is well appearing, alert and oriented x 3 today.   Head- normocephalic, atraumatic Eyes-  Sclera clear, conjunctiva pink Ears- hearing intact Oropharynx- clear Neck- supple Lungs- Clear to ausculation bilaterally, normal work of breathing Heart- Regular rate and rhythm, no murmurs, rubs or gallops, PMI not laterally displaced GI- soft, NT, ND, + BS Extremities- no clubbing, cyanosis, or edema, groin is without hematoma/ bruit, 2+ DP/PT pulses MS- no significant deformity or atrophy Skin- no rash or lesion Psych- euthymic mood, full affect Neuro- strength and sensation are intact  Labs:   Lab Results  Component Value Date   WBC 7.6 03/21/2014   HGB 13.3 03/21/2014   HCT 39.9 03/21/2014   MCV 90.4 03/21/2014   PLT 272.0 03/21/2014     Recent Labs Lab  03/30/14 0355  NA 141  K 3.7  CL 103  CO2 25  BUN 10  CREATININE 0.76  CALCIUM 8.8  GLUCOSE 91    Discharge Medications:    Medication List    STOP taking these medications        amiodarone 200 MG tablet  Commonly known as:  PACERONE     amoxicillin 875 MG tablet  Commonly known as:  AMOXIL     guaiFENesin 600 MG 12 hr  tablet  Commonly known as:  MUCINEX     ibuprofen 200 MG tablet  Commonly known as:  ADVIL,MOTRIN      TAKE these medications        acetaminophen 500 MG tablet  Commonly known as:  TYLENOL  Take 500 mg by mouth every 8 (eight) hours as needed for headache.     atorvastatin 10 MG tablet  Commonly known as:  LIPITOR  Take 10 mg by mouth daily.     pantoprazole 40 MG tablet  Commonly known as:  PROTONIX  Take 1 tablet (40 mg total) by mouth daily.     rivaroxaban 20 MG Tabs tablet  Commonly known as:  XARELTO  Take 1 tablet (20 mg total) by mouth daily with supper.        Disposition:   Follow-up Information    Follow up with Thompson Grayer, MD On 06/29/2014.   Specialty:  Cardiology   Why:  at 3:30PM   Contact information:   [place] [address] [phone]       Duration of Discharge Encounter: Greater than 30 minutes including physician time.  Signed,  Thompson Grayer MD

## 2014-03-29 NOTE — Progress Notes (Signed)
Site area: rt groin Site Prior to Removal:  Level 0 Pressure Applied For: 20 minutes Manual:   yes Patient Status During Pull:  stable Post Pull Site:  Level 0 Post Pull Instructions Given:  yes Post Pull Pulses Present: yes Dressing Applied:  V-Pad dressing Bedrest begins @ 0340 Comments: no complications. IV saline locked

## 2014-03-30 DIAGNOSIS — E785 Hyperlipidemia, unspecified: Secondary | ICD-10-CM | POA: Diagnosis not present

## 2014-03-30 DIAGNOSIS — I481 Persistent atrial fibrillation: Secondary | ICD-10-CM | POA: Diagnosis not present

## 2014-03-30 DIAGNOSIS — Z7901 Long term (current) use of anticoagulants: Secondary | ICD-10-CM | POA: Diagnosis not present

## 2014-03-30 DIAGNOSIS — I10 Essential (primary) hypertension: Secondary | ICD-10-CM | POA: Diagnosis not present

## 2014-03-30 DIAGNOSIS — I480 Paroxysmal atrial fibrillation: Secondary | ICD-10-CM

## 2014-03-30 LAB — BASIC METABOLIC PANEL
Anion gap: 13 (ref 5–15)
BUN: 10 mg/dL (ref 6–23)
CALCIUM: 8.8 mg/dL (ref 8.4–10.5)
CO2: 25 mEq/L (ref 19–32)
Chloride: 103 mEq/L (ref 96–112)
Creatinine, Ser: 0.76 mg/dL (ref 0.50–1.35)
GFR calc Af Amer: >90 mL/min (ref >90)
Glucose, Bld: 91 mg/dL (ref 70–99)
Potassium: 3.7 mEq/L (ref 3.7–5.3)
SODIUM: 141 meq/L (ref 137–147)

## 2014-03-30 MED ORDER — PANTOPRAZOLE SODIUM 40 MG PO TBEC: 40.0000 mg | DELAYED_RELEASE_TABLET | Freq: Every day | ORAL | Status: DC

## 2014-03-30 MED FILL — Fentanyl Citrate Inj 0.05 MG/ML: INTRAMUSCULAR | Qty: 2 | Status: AC

## 2014-03-30 NOTE — Plan of Care (Signed)
Problem: Discharge Progression Outcomes Goal: INR monitor plan established Outcome: Completed/Met Date Met:  03/30/14

## 2014-03-30 NOTE — Plan of Care (Signed)
Problem: Discharge Progression Outcomes Goal: Hemodynamically stable Outcome: Completed/Met Date Met:  03/30/14

## 2014-03-30 NOTE — Plan of Care (Signed)
Problem: Discharge Progression Outcomes Goal: Sinus rate/atrial ECG rhythm with HR < 100/min Outcome: Completed/Met Date Met:  03/30/14

## 2014-03-30 NOTE — Plan of Care (Signed)
Problem: Discharge Progression Outcomes Goal: Barriers To Progression Addressed/Resolved Outcome: Not Applicable Date Met:  71/27/87

## 2014-03-30 NOTE — Plan of Care (Signed)
Problem: Discharge Progression Outcomes Goal: Complications resolved/controlled Outcome: Not Applicable Date Met:  30/07/62

## 2014-03-30 NOTE — Anesthesia Postprocedure Evaluation (Signed)
Anesthesia Post Note  Patient: Jorge [name]  Procedure(s) Performed: Procedure(s) (LRB): ATRIAL FIBRILLATION ABLATION (N/A)  Anesthesia type: MAC  Patient location: PACU  Post pain: Pain level controlled  Post assessment: Patient's Cardiovascular Status Stable  Post vital signs: Reviewed and stable  Level of consciousness: sedated  Complications: No apparent anesthesia complications

## 2014-03-30 NOTE — Plan of Care (Signed)
Problem: Discharge Progression Outcomes Goal: Activity appropriate for discharge plan Outcome: Completed/Met Date Met:  03/30/14     

## 2014-03-30 NOTE — Plan of Care (Signed)
Problem: Discharge Progression Outcomes Goal: Pain controlled with appropriate interventions Outcome: Not Applicable Date Met:  03/30/14     

## 2014-03-30 NOTE — Plan of Care (Signed)
Problem: Discharge Progression Outcomes Goal: Tolerating diet Outcome: Completed/Met Date Met:  03/30/14

## 2014-03-30 NOTE — Plan of Care (Signed)
Problem: Discharge Progression Outcomes Goal: Discharge plan in place and appropriate Outcome: Completed/Met Date Met:  03/30/14     

## 2014-03-30 NOTE — Progress Notes (Signed)
Discharged home by wheelchair accompanied by wife, discharged instructions  given,  Belongings taken home.

## 2014-04-07 ENCOUNTER — Encounter (HOSPITAL_COMMUNITY): Payer: Self-pay | Admitting: Internal Medicine

## 2014-04-28 ENCOUNTER — Telehealth: Payer: Self-pay | Admitting: Internal Medicine

## 2014-04-28 NOTE — Telephone Encounter (Signed)
New Prob   Pt states pharmacy is needing prior authorization to fill Protonix. Please call.

## 2014-04-28 NOTE — Telephone Encounter (Signed)
PA for Pantoprazole sent via CoverMyMeds

## 2014-04-29 HISTORY — PX: COLONOSCOPY: SHX174

## 2014-05-01 ENCOUNTER — Encounter (HOSPITAL_COMMUNITY): Payer: Self-pay | Admitting: *Deleted

## 2014-05-03 NOTE — Telephone Encounter (Signed)
PA for 14 days of Pantoprazole approved. Patient and pharmacy notified.

## 2014-05-16 ENCOUNTER — Telehealth: Payer: Self-pay | Admitting: Internal Medicine

## 2014-05-16 NOTE — Telephone Encounter (Signed)
New Msg       Pt calling, states he was informed by Dr. Rayann Heman that if he was having abnormalities with HR to call to try to have an EKG the same day.   Pt is experiencing this today and would like a call back.    Please return call.

## 2014-05-16 NOTE — Telephone Encounter (Signed)
Pt calls today b/c he has been "jumping out of rhythm" more in the last couple of weeks since his ablation States he was told to call & could be worked in for an EKG. Pt is out of town today [facility]) & cannot come in.  States he does have pressure sometimes but none at this time. Pt would like to come in tomorrow for an EKG ( the schedule is full 1/19 & 1/20). He was quite pleasant & understanding that I could not schedule him for an EKG tomorrow   He would like Claiborne Billings to call him tomorrow morning & be worked in for an EKG. I will forward this to Dr. Rayann Heman & Barrett Shell RN

## 2014-05-17 NOTE — Telephone Encounter (Signed)
We can have him come by in the AM for EKG.

## 2014-06-29 ENCOUNTER — Encounter: Payer: Self-pay | Admitting: Internal Medicine

## 2014-06-29 ENCOUNTER — Telehealth: Payer: Self-pay | Admitting: Internal Medicine

## 2014-06-29 ENCOUNTER — Ambulatory Visit (INDEPENDENT_AMBULATORY_CARE_PROVIDER_SITE_OTHER): Payer: BC Managed Care – PPO | Admitting: Internal Medicine

## 2014-06-29 VITALS — BP 138/84 | HR 142 | Ht 70.5 in | Wt 189.1 lb

## 2014-06-29 DIAGNOSIS — I10 Essential (primary) hypertension: Secondary | ICD-10-CM

## 2014-06-29 DIAGNOSIS — I481 Persistent atrial fibrillation: Secondary | ICD-10-CM

## 2014-06-29 DIAGNOSIS — I4819 Other persistent atrial fibrillation: Secondary | ICD-10-CM

## 2014-06-29 LAB — BASIC METABOLIC PANEL
BUN: 13 mg/dL (ref 6–23)
CHLORIDE: 105 meq/L (ref 96–112)
CO2: 28 meq/L (ref 19–32)
Calcium: 9.8 mg/dL (ref 8.4–10.5)
Creatinine, Ser: 0.89 mg/dL (ref 0.40–1.50)
GFR: 96.61 mL/min (ref >60.00)
Glucose, Bld: 99 mg/dL (ref 70–99)
POTASSIUM: 4.3 meq/L (ref 3.5–5.1)
Sodium: 138 mEq/L (ref 135–145)

## 2014-06-29 LAB — MAGNESIUM: Magnesium: 2 mg/dL (ref 1.5–2.5)

## 2014-06-29 MED ORDER — DILTIAZEM HCL ER COATED BEADS 180 MG PO CP24: 180.0000 mg | ORAL_CAPSULE | Freq: Every day | ORAL | Status: DC

## 2014-06-29 NOTE — Telephone Encounter (Signed)
Will resend

## 2014-06-29 NOTE — Patient Instructions (Signed)
Tikosyn Load soon---[name] will call you  Your physician has recommended you make the following change in your medication:  1)

## 2014-06-29 NOTE — Progress Notes (Signed)
PCP: Miguel Aschoff, MD  Jorge [name] is a 49 y.o. male who presents today for routine electrophysiology followup.  He continues to have episodic afib with RVR.  His exercise tolerance is preserved.   He is not on AAD therapy presently and has previously failed flecainide, multaq, and amiodarone prior to his most recent ablation.  He has been off of amiodarone since 03/30/14.  Today, he denies symptoms of chest pain, shortness of breath,  lower extremity edema, dizziness, presyncope, or syncope.  The patient is otherwise without complaint today.   Past Medical History  Diagnosis Date  . Atrial fibrillation     first diagnosed 2011; s/p DCCV at that time; PVI 10-2013; PVI 03/2014  . Hyperlipidemia   . Hypertension   . Sinusitis   . Headache    Past Surgical History  Procedure Laterality Date  . Cardioversion      Hx of  . Appendectomy  1975  . Rhinoplasty  2006    Dr. Nadeen Landau, [facility]  . Knee surgery  2009    [facility]  . Hernia repair      1.30.15  . Cardioversion N/A 10/15/2013    Procedure: CARDIOVERSION;  Surgeon: Fay Records, MD;  Location: [place];  Service: Cardiovascular;  Laterality: N/A;  . Tee without cardioversion N/A 11/01/2013    Procedure: TRANSESOPHAGEAL ECHOCARDIOGRAM (TEE);  Surgeon: Candee Furbish, MD;  Location: [facility] ENDOSCOPY;  Service: Cardiovascular;  Laterality: N/A;  . Tee without cardioversion N/A 03/28/2014    Procedure: TRANSESOPHAGEAL ECHOCARDIOGRAM (TEE);  Surgeon: Larey Dresser, MD;  Location: [facility] ENDOSCOPY;  Service: Cardiovascular;  Laterality: N/A;  . Atrial fibrillation ablation N/A 11/02/2013    PVI by Dr Rayann Heman  . Atrial fibrillation ablation N/A 03/29/2014    PVI and CTI by Dr Rayann Heman    ROS- all systems are reviewed and negatives except as per HPI above  Current Outpatient Prescriptions  Medication Sig Dispense Refill  . acetaminophen (TYLENOL) 500 MG tablet Take 500 mg by mouth every 8 (eight) hours as needed for  headache.    Marland Kitchen atorvastatin (LIPITOR) 10 MG tablet Take 10 mg by mouth daily.    . fluticasone (FLONASE) 50 MCG/ACT nasal spray Place 2 sprays into both nostrils daily.    Marland Kitchen ibuprofen (ADVIL,MOTRIN) 200 MG tablet Take 800 mg by mouth every 6 (six) hours as needed (headache and knee pain).    . rivaroxaban (XARELTO) 20 MG TABS tablet Take 1 tablet (20 mg total) by mouth daily with supper. 30 tablet 11  . diltiazem (CARDIZEM CD) 180 MG 24 hr capsule Take 1 capsule (180 mg total) by mouth daily. 90 capsule 3   No current facility-administered medications for this visit.    Physical Exam: Filed Vitals:   06/29/14 0923  BP: 138/84  Pulse: 142  Height: 5' 10.5" (1.791 m)  Weight: 189 lb 1.9 oz (85.784 kg)    GEN- The patient is well appearing, alert and oriented x 3 today.   Head- normocephalic, atraumatic Eyes-  Sclera clear, conjunctiva pink Ears- hearing intact Oropharynx- clear Lungs- Clear to ausculation bilaterally, normal work of breathing Heart- irregular rate and rhythm, no murmurs, rubs or gallops, PMI not laterally displaced GI- soft, NT, ND, + BS Extremities- no clubbing, cyanosis, or edema  ekg reveals afib, V rate 140 bpm  Assessment and Plan:  1. afib Very refractory afib despite 3 prior AADs and 2 prior ablations.  This patient requires a very complex decision  making/ prolonged visit today.  He is hard to rate control and has not well tolerated diltiazem or metoprolol in the past. Therapeutic strategies for afib including medicine and surgical MAZE were discussed in detail with the patient today.  At this time, he would prefer medical therapy.  I will therefore plan admission for initiation of tikosyn.  Risks of this medicine were discussed today.  He would like to proceed.  I have started diltiazem CD 180mg  daily in the interim for better rate control.  chads2vasc score is 1. He is compliant with anticoagulation.  2. HTN Stable No change required today  Today, I  have spent 40 minutes with the patient discussing afib management.  More than 50% of the visit time today was spent on this issue.  Thompson Grayer MD 06/29/2014 11:24 AM '

## 2014-06-29 NOTE — Telephone Encounter (Signed)
New problem   Pt stated his diltiazem 180mg  medication  wasn't called in this morning after his appt. Please advise pt.

## 2014-07-04 ENCOUNTER — Ambulatory Visit (INDEPENDENT_AMBULATORY_CARE_PROVIDER_SITE_OTHER): Payer: BC Managed Care – PPO | Admitting: Pharmacist

## 2014-07-04 ENCOUNTER — Inpatient Hospital Stay (HOSPITAL_COMMUNITY)
Admission: AD | Admit: 2014-07-04 | Discharge: 2014-07-07 | DRG: 310 | Disposition: A | Discharge status: Home / Self Care | Payer: BC Managed Care – PPO | Source: Ambulatory Visit | Attending: Internal Medicine | Admitting: Internal Medicine

## 2014-07-04 ENCOUNTER — Encounter (HOSPITAL_COMMUNITY): Payer: Self-pay | Admitting: Physician Assistant

## 2014-07-04 DIAGNOSIS — Z888 Allergy status to other drugs, medicaments and biological substances status: Secondary | ICD-10-CM | POA: Diagnosis not present

## 2014-07-04 DIAGNOSIS — I481 Persistent atrial fibrillation: Principal | ICD-10-CM | POA: Diagnosis present

## 2014-07-04 DIAGNOSIS — E785 Hyperlipidemia, unspecified: Secondary | ICD-10-CM | POA: Diagnosis present

## 2014-07-04 DIAGNOSIS — I4891 Unspecified atrial fibrillation: Secondary | ICD-10-CM | POA: Diagnosis present

## 2014-07-04 DIAGNOSIS — Z7901 Long term (current) use of anticoagulants: Secondary | ICD-10-CM | POA: Diagnosis not present

## 2014-07-04 DIAGNOSIS — I4819 Other persistent atrial fibrillation: Secondary | ICD-10-CM

## 2014-07-04 DIAGNOSIS — I10 Essential (primary) hypertension: Secondary | ICD-10-CM | POA: Diagnosis present

## 2014-07-04 DIAGNOSIS — Z9049 Acquired absence of other specified parts of digestive tract: Secondary | ICD-10-CM | POA: Diagnosis present

## 2014-07-04 HISTORY — DX: Long term (current) use of anticoagulants: Z79.01

## 2014-07-04 HISTORY — DX: Cardiac arrhythmia, unspecified: I49.9

## 2014-07-04 LAB — BASIC METABOLIC PANEL
BUN: 18 mg/dL (ref 6–23)
CHLORIDE: 102 meq/L (ref 96–112)
CO2: 28 meq/L (ref 19–32)
Calcium: 9.6 mg/dL (ref 8.4–10.5)
Creatinine, Ser: 0.9 mg/dL (ref 0.40–1.50)
GFR: 95.37 mL/min (ref >60.00)
GLUCOSE: 99 mg/dL (ref 70–99)
POTASSIUM: 4 meq/L (ref 3.5–5.1)
SODIUM: 136 meq/L (ref 135–145)

## 2014-07-04 LAB — MAGNESIUM: Magnesium: 1.9 mg/dL (ref 1.5–2.5)

## 2014-07-04 MED ORDER — IBUPROFEN 800 MG PO TABS: 800.0000 mg | ORAL_TABLET | Freq: Three times a day (TID) | ORAL | Status: DC | PRN

## 2014-07-04 MED ORDER — RIVAROXABAN 20 MG PO TABS
20.0000 mg | ORAL_TABLET | Freq: Every day | ORAL | Status: DC
  Administered 2014-07-04 – 2014-07-06 (×3): 20 mg via ORAL
  Filled 2014-07-04 (×3): qty 1

## 2014-07-04 MED ORDER — DILTIAZEM HCL ER COATED BEADS 180 MG PO CP24
180.0000 mg | ORAL_CAPSULE | Freq: Every evening | ORAL | Status: DC
  Administered 2014-07-04 – 2014-07-06 (×3): 180 mg via ORAL
  Filled 2014-07-04 (×3): qty 1

## 2014-07-04 MED ORDER — FLUTICASONE PROPIONATE 50 MCG/ACT NA SUSP
1.0000 | Freq: Every morning | NASAL | Status: DC
  Administered 2014-07-05 – 2014-07-06 (×2): 1 via NASAL
  Filled 2014-07-04: qty 16

## 2014-07-04 MED ORDER — DOFETILIDE 500 MCG PO CAPS
500.0000 ug | ORAL_CAPSULE | Freq: Two times a day (BID) | ORAL | Status: DC
  Filled 2014-07-04: qty 1

## 2014-07-04 MED ORDER — SODIUM CHLORIDE 0.9 % IJ SOLN
3.0000 mL | Freq: Two times a day (BID) | INTRAMUSCULAR | Status: DC
  Administered 2014-07-04 – 2014-07-06 (×3): 3 mL via INTRAVENOUS

## 2014-07-04 MED ORDER — DOFETILIDE 500 MCG PO CAPS
500.0000 ug | ORAL_CAPSULE | Freq: Two times a day (BID) | ORAL | Status: DC
  Administered 2014-07-04 – 2014-07-07 (×6): 500 ug via ORAL
  Filled 2014-07-04 (×5): qty 1

## 2014-07-04 MED ORDER — ATORVASTATIN CALCIUM 10 MG PO TABS
10.0000 mg | ORAL_TABLET | Freq: Every evening | ORAL | Status: DC
  Administered 2014-07-04 – 2014-07-06 (×3): 10 mg via ORAL
  Filled 2014-07-04 (×3): qty 1

## 2014-07-04 MED ORDER — SODIUM CHLORIDE 0.9 % IJ SOLN
3.0000 mL | INTRAMUSCULAR | Status: DC | PRN
  Administered 2014-07-05 (×2): 3 mL via INTRAVENOUS
  Filled 2014-07-04 (×2): qty 3

## 2014-07-04 MED ORDER — SODIUM CHLORIDE 0.9 % IV SOLN: 250.0000 mL | INTRAVENOUS | Status: DC | PRN

## 2014-07-04 NOTE — Progress Notes (Signed)
Jorge [name] is a 49 yo male with episodic afib with RVR. He has previously failed flecainide, Multaq, and amiodarone plus 2 prior ablations. Difficulty with rate control in the past with metoprolol and diltiazem. He was seen last week by Dr. Rayann Heman -- medical intervention vs. MAZE were discussed with patient who preferred medical therapy. He presents to clinic today with plan for Tikosyn initiation.   Reviewed pt's medication list.Amiodarone was stopped 03/30/14. He is on no contraindicated medications or QTc prolongating medications.He has been compliant with his Xarelto for the past 30 days.   Discussed potential side effects with patient including QTc prolongation.He is aware to call the office if he misses more than 2 doses in a row.Patient has Pharmacist, community and was given a $10/month copay card.   07/04/14: BMET results: K=4, Mg=1.9, SCr=0.9, CrCl = 122 mL/min. recommended Tikosyn dose of 500 mcg BID based on renal function.  EKG reviewed by Dr. Rayann Heman. Afib with a vent rate of 92 bpm.QTc 360 msec.   Current Outpatient Prescriptions on File Prior to Visit  Medication Sig Dispense Refill  . acetaminophen (TYLENOL) 500 MG tablet Take 500 mg by mouth every 8 (eight) hours as needed for headache.    . diltiazem (CARDIZEM CD) 180 MG 24 hr capsule Take 1 capsule (180 mg total) by mouth daily. 90 capsule 3  . fluticasone (FLONASE) 50 MCG/ACT nasal spray Place 2 sprays into both nostrils daily.    Marland Kitchen ibuprofen (ADVIL,MOTRIN) 200 MG tablet Take 800 mg by mouth every 6 (six) hours as needed (headache and knee pain).    . rivaroxaban (XARELTO) 20 MG TABS tablet Take 1 tablet (20 mg total) by mouth daily with supper. 30 tablet 11  . atorvastatin (LIPITOR) 10 MG tablet Take 10 mg by mouth daily.     No current facility-administered medications on file prior to visit.   Allergies  Allergen Reactions  . Oxycodone Nausea And Vomiting  . Vicodin [Hydrocodone-Acetaminophen] Nausea And Vomiting    Assessment and Plan  1.Atrial fibrillation - Patient reports compliance with Xarelto for past 30 days, QTc <440 msec, Mg >1.8 and and K was 4 today. Patient called this afternoon with results, will be admitted this afternoon for Tikosyn initiation. Recommended dosing Tikosyn 500 mcg BID based on CrCl > 60.mL/min.  Danett Palazzo E. Cortlynn Hollinsworth, Pharm.D Clinical Pharmacy Resident Pager: [phone] 07/04/2014 10:29 AM

## 2014-07-04 NOTE — Progress Notes (Signed)
I have seen, examined the patient, and reviewed the above assessment and plan with Rosaria Ferries.  Changes are made where necessary.     Pt is doing well.  Reports compliance with anticoagulation.  Exam is tachycardic irregular rhythm. I have personally reviewed his QT and it is <440 msec.  Will therefore initiate tikosyn at this time with close inpatient management.   Co Sign: Thompson Grayer, MD 07/04/2014 6:16 PM

## 2014-07-04 NOTE — Discharge Instructions (Addendum)
Information on my medicine - XARELTO (Rivaroxaban)  This medication education was reviewed with me or my healthcare representative as part of my discharge preparation.    Why was Xarelto prescribed for you? Xarelto was prescribed for you to reduce the risk of a blood clot forming that can cause a stroke if you have a medical condition called atrial fibrillation (a type of irregular heartbeat).  What do you need to know about xarelto ? Take your Xarelto ONCE DAILY at the same time every day with your evening meal. If you have difficulty swallowing the tablet whole, you may crush it and mix in applesauce just prior to taking your dose.  Take Xarelto exactly as prescribed by your doctor and DO NOT stop taking Xarelto without talking to the doctor who prescribed the medication.  Stopping without other stroke prevention medication to take the place of Xarelto may increase your risk of developing a clot that causes a stroke.  Refill your prescription before you run out.  After discharge, you should have regular check-up appointments with your healthcare provider that is prescribing your Xarelto.  In the future your dose may need to be changed if your kidney function or weight changes by a significant amount.  What do you do if you miss a dose? If you are taking Xarelto ONCE DAILY and you miss a dose, take it as soon as you remember on the same day then continue your regularly scheduled once daily regimen the next day. Do not take two doses of Xarelto at the same time or on the same day.   Important Safety Information A possible side effect of Xarelto is bleeding. You should call your healthcare provider right away if you experience any of the following: ? Bleeding from an injury or your nose that does not stop. ? Unusual colored urine (red or dark brown) or unusual colored stools (red or black). ? Unusual bruising for unknown reasons. ? A serious fall or if you hit your head (even if  there is no bleeding).  Some medicines may interact with Xarelto and might increase your risk of bleeding while on Xarelto. To help avoid this, consult your healthcare provider or pharmacist prior to using any new prescription or non-prescription medications, including herbals, vitamins, non-steroidal anti-inflammatory drugs (NSAIDs) and supplements.  This website has more information on Xarelto: [email or web address]  Dofetilide capsules What is this medicine? DOFETILIDE (doe FET il ide) is an antiarrhythmic drug. It helps make your heart beat regularly. This medicine also helps to slow rapid heartbeats. This medicine may be used for other purposes; ask your health care provider or pharmacist if you have questions. COMMON BRAND NAME(S): Tikosyn What should I tell my health care provider before I take this medicine? They need to know if you have any of these conditions: -heart disease or heart failure -history of low levels of potassium or magnesium -kidney disease -liver disease -an unusual or allergic reaction to dofetilide, other medicines, foods, dyes, or preservatives -pregnant or trying to get pregnant -breast-feeding How should I use this medicine? Take this medicine by mouth with a glass of water. Follow the directions on the prescription label. You can take this medicine with or without food. Do not drink grapefruit juice with this medicine. Take your doses at regular intervals. Do not take your medicine more often than directed. Do not stop taking this medicine suddenly. This may cause serious, heart-related side effects. Your doctor will tell you how much medicine to take. If your doctor  wants you to stop the medicine, the dose will be slowly lowered over time to avoid any side effects. Talk to your pediatrician regarding the use of this medicine in children. Special care may be needed. Overdosage: If you think you have taken too much of this medicine contact a poison control center  or emergency room at once. NOTE: This medicine is only for you. Do not share this medicine with others. What if I miss a dose? If you miss a dose, take it as soon as you can. If it is almost time for your next dose, take only that dose. Do not take double or extra doses. What may interact with this medicine? Do not take this medicine with any of the following medications: -arsenic trioxide -certain macrolide antibiotics -certain medicines for fungal infections like ketoconazole and itraconazole -certain medicines for HIV, AIDS called protease inhibitors like lopinavir; ritonavir, ritonavir, and saquinavir -certain quinolone antibiotics -cimetidine -cisapride -conivaptan -cyclobenzaprine -droperidol -grapefruit juice -haloperidol -hawthorn -imatinib -levomethadyl -medicines for depression, anxiety, or psychotic disturbances -medicines for malaria like chloroquine and halofantrine -megestrol -memantine -metformin -methadone -mibefradil -mifepristone -other medicines to control heart rhythm -pentamidine -phenothiazines like chlorpromazine, mesoridazine, prochlorperazine, thioridazine -ranolazine -sertindole -telithromycin -thiazide diuretics -trimethoprim -trospium -vardenafil -verapamil -ziprasidone This medicine may also interact with the following medications: -antidepressants called SSRIs -certain medicines for fungal infections like fluconazole or voriconazole -certain heart medicines like digoxin or diltiazem -diuretics -dronabinol, THC -norfloxacin -other medicines for HIV, AIDS called protease inhibitors -quinine -zafirlukast This list may not describe all possible interactions. Give your health care provider a list of all the medicines, herbs, non-prescription drugs, or dietary supplements you use. Also tell them if you smoke, drink alcohol, or use illegal drugs. Some items may interact with your medicine. What should I watch for while using this  medicine? Visit your doctor or health care professional for regular checks on your progress. Wear a medical ID bracelet or chain, and carry a card that describes your disease and details of your medicine and dosage times. Check your heart rate and blood pressure regularly while you are taking this medicine. Ask your doctor or health care professional what your heart rate and blood pressure should be, and when you should contact him or her. Your doctor or health care professional also may schedule regular tests to check your progress. You will be started on this medicine in a specialized facility for at least three days. You will be monitored to find the right dose of medicine for you. It is very important that you take your medicine exactly as prescribed when you leave the hospital. The correct dosing of this medicine is very important to treat your condition and prevent possible serious side effects. What side effects may I notice from receiving this medicine? Side effects that you should report to your doctor or health care professional as soon as possible: -allergic reactions like skin rash, itching or hives, swelling of the face, lips, or tongue -breathing problems -dizziness -fast or rapid beating of the heart -feeling faint or lightheaded -swelling of the ankles -unusually weak or tired -vomiting Side effects that usually do not require medical attention (report to your doctor or health care professional if they continue or are bothersome): -cough -diarrhea -difficulty sleeping -headache -nausea -stomach pain This list may not describe all possible side effects. Call your doctor for medical advice about side effects. You may report side effects to FDA at 1-800-FDA-1088. Where should I keep my medicine? Keep out of the reach of  children. Store at room temperature between 15 and 30 degrees C (59 and 86 degrees F). Protect the medicine from moisture or humidity. Keep container tightly  closed. Throw away any unused medicine after the expiration date. NOTE: This sheet is a summary. It may not cover all possible information. If you have questions about this medicine, talk to your doctor, pharmacist, or health care provider.  2015, Elsevier/Gold Standard. (2012-10-14 16:40:57)

## 2014-07-04 NOTE — Progress Notes (Addendum)
Pharmacy Consult for Dofetilide (Tikosyn) Initiation  Admit Complaint: 49 y.o. male admitted 07/04/2014 with atrial fibrillation to be initiated on dofetilide after reivew by the Tikosyn initiation clinic at [place] today (3/7)  Assessment:  Patient Exclusion Criteria: If any screening criteria checked as "Yes", then  patient  should NOT receive dofetilide until criteria item is corrected. If "Yes" please indicate correction plan.  YES  NO Patient  Exclusion Criteria Correction Plan  []  [x]  Baseline QTc interval is greater than or equal to 440 msec. IF above YES box checked dofetilide contraindicated unless patient has ICD; then may proceed if QTc 500-550 msec or with known ventricular conduction abnormalities may proceed with QTc 550-600 msec. QTc =  > 360 per clinic eval of EKG  **3/7 EKG: QTc on file is 463 - Confirmed with Dr. Rayann Heman by phone that he personally reviewed the EKG, and it was <440 ms and ok to start**   []  [x]  Magnesium level is less than 1.8 mEq/l : Last magnesium:  Lab Results  Component Value Date   MG 1.9 07/04/2014         []  [x]  Potassium level is less than 4 mEq/l : Last potassium:  Lab Results  Component Value Date   K 4.0 07/04/2014         []  []  Patient is known or suspected to have a digoxin level greater than 2 ng/ml: No results found for: DIGOXIN    []  [x]  Creatinine clearance less than 20 ml/min (calculated using Cockcroft-Gault, actual body weight and serum creatinine): Estimated Creatinine Clearance: 105.3 mL/min (by C-G formula based on Cr of 0.9).    []  [x]  Patient has received drugs known to prolong the QT intervals within the last 48 hours(phenothiazines, tricyclics or tetracyclic antidepressants, erythromycin, H-1 antihistamines, cisapride, fluoroquinolones, azithromycin). Drugs not listed above may have an, as yet, undetected potential to prolong the QT interval, updated information on QT prolonging agents is available at this  website:QT prolonging agents   []  [x]  Patient received a dose of hydrochlorothiazide (Oretic) alone or in any combination including triamterene (Dyazide, Maxzide) in the last 48 hours.   []  [x]  Patient received a medication known to increase dofetilide plasma concentrations prior to initial dofetilide dose:  . Trimethoprim (Primsol, Proloprim) in the last 36 hours . Verapamil (Calan, Verelan) in the last 36 hours or a sustained release dose in the last 72 hours . Megestrol (Megace) in the last 5 days  . Cimetidine (Tagamet) in the last 6 hours . Ketoconazole (Nizoral) in the last 24 hours . Itraconazole (Sporanox) in the last 48 hours  . Prochlorperazine (Compazine) in the last 36 hours    []  [x]  Patient is known to have a history of torsades de pointes; congenital or acquired long QT syndromes.   []  [x]  Patient has received a Class 1 antiarrhythmic with less than 2 half-lives since last dose. (Disopyramide, Quinidine, Procainamide, Lidocaine, Mexiletine, Flecainide, Propafenone)   []  [x]  Patient has received amiodarone therapy in the past 3 months or amiodarone level is greater than 0.3 ng/ml.> last amiodarone dose 03/30/14    Patient has been appropriately anticoagulated with xarelto and compliant for 30 days.  Ordering provider was confirmed at [email or web address] if they are not listed on the [place] Prescribers list.  Goal of Therapy: Follow renal function, electrolytes, potential drug interactions, and dose adjustment. Provide education and 1 week supply at discharge.  Plan:  [x]   Physician selected initial dose within range recommended for patients  level of renal function - will monitor for response.  []   Physician selected initial dose outside of range recommended for patients level of renal function - will discuss if the dose should be altered at this time.   Select One Calculated CrCl  Dose q12h  [x]  > 60 ml/min 500 mcg  []  40-60 ml/min 250 mcg  []  20-40 ml/min 125  mcg   2. Follow up QTc after the first 5 doses, renal function, electrolytes (K & Mg) daily x 3     days, dose adjustment, success of initiation and facilitate 1 week discharge supply as     clinically indicated.  3. Initiate Tikosyn education video (Call [phone] and ask for video # 116).  4. Place Enrollment Form on the chart for discharge supply of dofetilide.   Thank you for allowing pharmacy to be a part of this patients care team.  Rowe Robert Pharm.D., BCPS, AQ-Cardiology Clinical Pharmacist 07/04/2014 3:42 PM Pager: [phone] Phone: [phone]

## 2014-07-04 NOTE — H&P (Addendum)
History and Physical   Patient ID: Jorge [name] MRN: [id number], DOB/AGE: December 04, 1965 49 y.o. Date of Encounter: 07/04/2014  Primary Physician: Miguel Aschoff, MD Primary Electrophysiologist: Dr. Rayann Heman  Chief Complaint:  Atrial fibrillation, rapid ventricular response.  HPI: Jorge [name] is a 49 y.o. male with a history of atrial fibrillation. His rate has been hard to control and he is symptomatic. He has failed ablation twice and failed multiple medications including Multaq, amiodarone and Flecainide. He was seen by Dr. Rayann Heman on 03/02 and the situation was discussed in detail.    03/02: "Very refractory afib despite 3 prior AADs and 2 prior ablations. This patient requires a very complex decision making/ prolonged visit today. He is hard to rate control and has not well tolerated diltiazem or metoprolol in the past. Therapeutic strategies for afib including medicine and surgical MAZE were discussed in detail with the patient today. At this time, he would prefer medical therapy. I will therefore plan admission for initiation of tikosyn. Risks of this medicine were discussed today. He would like to proceed. I have started diltiazem CD 180mg  daily in the interim for better rate control."  03/07: Seen by M. Supple, Pharm-D and admission for Tikosyn arranged. "Patient reports compliance with Xarelto for past 30 days, QTc <440 msec, Mg >1.8 and and K was 4 today. Patient called this afternoon with results, will be admitted this afternoon for Tikosyn initiation. Recommended dosing Tikosyn 500 mcg BID based on CrCl > 60.mL/min."  Jorge [name] is here for Tikosyn loading. He has been compliant with medications, including the Cardizem, but is still aware of the atrial fibrillation. It gives him palpitations and makes him SOB at times. No chest pain, no presyncope or syncope.    Past Medical History  Diagnosis Date  . Atrial fibrillation     first diagnosed 2011; s/p DCCV at that time;  ablation 10-2013 & 03/2014; failed multaq, flecainide and amiodarone  . Hyperlipidemia   . Hypertension   . Sinusitis   . Headache   . Chronic anticoagulation     Xarelto    Surgical History:  Past Surgical History  Procedure Laterality Date  . Cardioversion      Hx of  . Appendectomy  1975  . Rhinoplasty  2006    Dr. Nadeen Landau, [facility]  . Knee surgery  2009    [facility]  . Hernia repair      1.30.15  . Cardioversion N/A 10/15/2013    Procedure: CARDIOVERSION;  Surgeon: Fay Records, MD;  Location: [facility];  Service: Cardiovascular;  Laterality: N/A;  . Tee without cardioversion N/A 11/01/2013    Procedure: TRANSESOPHAGEAL ECHOCARDIOGRAM (TEE);  Surgeon: Candee Furbish, MD;  Location: [facility] ENDOSCOPY;  Service: Cardiovascular;  Laterality: N/A;  . Tee without cardioversion N/A 03/28/2014    Procedure: TRANSESOPHAGEAL ECHOCARDIOGRAM (TEE);  Surgeon: Larey Dresser, MD;  Location: [facility] ENDOSCOPY;  Service: Cardiovascular;  Laterality: N/A;  . Atrial fibrillation ablation N/A 11/02/2013    PVI by Dr Rayann Heman  . Atrial fibrillation ablation N/A 03/29/2014    PVI and CTI by Dr Rayann Heman     I have reviewed the patient's current medications. Prior to Admission medications   Medication Sig Start Date End Date Taking? Authorizing Provider  acetaminophen (TYLENOL) 500 MG tablet Take 1,000 mg by mouth every 6 (six) hours as needed for headache (pain).    Yes Historical Provider, MD  atorvastatin (LIPITOR) 10 MG tablet Take 10 mg by mouth  every evening.    Yes Historical Provider, MD  diltiazem (CARDIZEM CD) 180 MG 24 hr capsule Take 1 capsule (180 mg total) by mouth daily. Patient taking differently: Take 180 mg by mouth every evening.  06/29/14  Yes Thompson Grayer, MD  fluticasone (FLONASE) 50 MCG/ACT nasal spray Place 1 spray into both nostrils every morning.    Yes Historical Provider, MD  ibuprofen (ADVIL,MOTRIN) 200 MG tablet Take 800 mg by mouth every 8 (eight) hours as needed  (headache and knee pain).    Yes Historical Provider, MD  rivaroxaban (XARELTO) 20 MG TABS tablet Take 1 tablet (20 mg total) by mouth daily with supper. 09/22/13  Yes Evans Lance, MD   Allergies:  Allergies  Allergen Reactions  . Oxycodone Nausea And Vomiting  . Vicodin [Hydrocodone-Acetaminophen] Nausea And Vomiting    Can take regular Tylenol   History   Social History  . Marital Status: Married    Spouse Name: N/A  . Number of Children: N/A  . Years of Education: N/A   Occupational History  . [employer] Server w/ [employer]    Social History Main Topics  . Smoking status: Never Smoker   . Smokeless tobacco: Not on file  . Alcohol Use: 1.2 oz/week    2 Cans of beer per week     Comment: occasionally  . Drug Use: No  . Sexual Activity: Not on file   Other Topics Concern  . Not on file   Social History Narrative   Lives in [place] with his spouse.  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[employer]    Family History  Problem Relation Age of Onset  . Colon polyps      Fam Hx  . Cancer Mother     breast, cause of death  . Heart attack Maternal Grandfather 53    Cause of death  . Hypertension Maternal Grandfather   . Heart failure Brother   . CVA Paternal Grandmother     cause of death  . Atrial fibrillation Maternal Grandmother    Family Status  Relation Status Death Age  . Mother Deceased 82  . Maternal Grandfather Deceased 62    from MI  . Brother Alive     stable health, mental health issues  . Paternal Grandmother Deceased     From CVA  . Father Deceased 67    suicide  . Maternal Grandmother Deceased     Review of Systems:   Full 14-point review of systems otherwise negative except as noted above.  Physical Exam: Blood pressure 120/86, pulse 108, temperature 99.1 F (37.3 C), temperature source Oral, height 5' 10.5" (1.791 m), weight 185 lb 3 oz (84 kg), SpO2 98 %. General: Well developed, well nourished,male in no acute distress. Head:  Normocephalic, atraumatic, sclera non-icteric, no xanthomas, nares are without discharge. Dentition: good Neck: No carotid bruits. JVD not elevated. No thyromegally Lungs: Good expansion bilaterally. without wheezes or rhonchi.  Heart: IRRegular rate and rhythm with S1 S2.  No S3 or S4.  No murmur, no rubs, or gallops appreciated. Abdomen: Soft, non-tender, non-distended with normoactive bowel sounds. No hepatomegaly. No rebound/guarding. No obvious abdominal masses. Msk:  Strength and tone appear normal for age. No joint deformities or effusions, no spine or costo-vertebral angle tenderness. Extremities: No clubbing or cyanosis. No edema.  Distal pedal pulses are 2+ in 4 extrem Neuro: Alert and oriented X 3. Moves all extremities spontaneously. No focal deficits noted. Psych:  Responds to  questions appropriately with a normal affect. Skin: No rashes or lesions noted  Labs:   Lab Results  Component Value Date   WBC 7.6 03/21/2014   HGB 13.3 03/21/2014   HCT 39.9 03/21/2014   MCV 90.4 03/21/2014   PLT 272.0 03/21/2014     Recent Labs Lab 07/04/14 1004  NA 136  K 4.0  CL 102  CO2 28  BUN 18  CREATININE 0.90  CALCIUM 9.6  GLUCOSE 99   MAGNESIUM  Date Value Ref Range Status  07/04/2014 1.9 1.5 - 2.5 mg/dL Final   ECG: pending  ASSESSMENT AND PLAN:  Principal Problem:   Atrial fibrillation with rapid ventricular response - here today for Tikosyn loading - labs and ECG OK to proceed. - start 500 mcg q 12 hr - follow labs and ECG daily  Active Problems:   Essential hypertension - good control on current rx    Chronic anticoagulation - Xarelto - continue Xarelto  Signed, Rosaria Ferries, PA-C 07/04/2014 3:26 PM Beeper [phone]    I have seen, examined the patient, and reviewed the above assessment and plan.  Changes to above are made where necessary. Tachycardic irregular rhythm on exam.  Will plan to initiate tikosyn at this time.  Risks of the medicine were  discussed with the patient who would like to proceed.  If he remains in afib, will require cardioversion on Wed.  Co Sign: Thompson Grayer, MD 07/04/2014 9:29 PM

## 2014-07-05 LAB — BASIC METABOLIC PANEL
ANION GAP: 8 (ref 5–15)
BUN: 15 mg/dL (ref 6–23)
CHLORIDE: 106 mmol/L (ref 96–112)
CO2: 25 mmol/L (ref 19–32)
CREATININE: 0.82 mg/dL (ref 0.50–1.35)
Calcium: 9.2 mg/dL (ref 8.4–10.5)
GFR calc Af Amer: >90 mL/min (ref >90)
GFR calc non Af Amer: >90 mL/min (ref >90)
GLUCOSE: 101 mg/dL — AB (ref 70–99)
Potassium: 4 mmol/L (ref 3.5–5.1)
Sodium: 139 mmol/L (ref 135–145)

## 2014-07-05 LAB — MAGNESIUM: Magnesium: 2.1 mg/dL (ref 1.5–2.5)

## 2014-07-05 NOTE — Care Management Note (Addendum)
    Page 1 of 1   07/05/2014     4:56:34 PM CARE MANAGEMENT NOTE 07/05/2014  Patient:  Jorge [name], Jorge [name]   Account Number:  [id number]  Date Initiated:  07/05/2014  Documentation initiated by:  GRAVES-BIGELOW,Roseann Kees  Subjective/Objective Assessment:   Pt admitted for Tikosyn Load. Pt uses [facility] on [address] [phone]. [facility] unsure if on  T[facility]- CM will call back.     Action/Plan:   Benefits check in process and will make pt aware once completed. Pt will need a Tikosyn Rx for 7 day supply once medically stable. Pt was given the $10.00 co pay card from the office.   Anticipated DC Date:  07/08/2014   Anticipated DC Plan:  HOME/SELF CARE      DC Planning Services  CM consult  Medication Assistance      Choice offered to / List presented to:             Status of service:  Completed, signed off Medicare Important Message given?  NO (If response is "NO", the following Medicare IM given date fields will be blank) Date Medicare IM given:   Medicare IM given by:   Date Additional Medicare IM given:   Additional Medicare IM given by:    Discharge Disposition:  HOME/SELF CARE  Per UR Regulation:  Reviewed for med. necessity/level of care/duration of stay  If discussed at Long Length of Stay Meetings, dates discussed:    Comments:  PT COPAY WILL BE $64.30 FOR ALL THE 3- [facility] in [place] does not have Tikosyn availalble. [facility] [place] [facility] the [employer] [address]. Will make pt aware. Jacqlyn Krauss, RN,BSN [phone].

## 2014-07-05 NOTE — Progress Notes (Signed)
Pt converted to NSR at 0755. 12 lead EKG obtained. Luetta Nutting, NP with EP made aware. No new orders received. Will continue to monitor pt closely.

## 2014-07-05 NOTE — Progress Notes (Signed)
UR Completed Genova Kiner Graves-Bigelow, RN,BSN [phone]  

## 2014-07-05 NOTE — Progress Notes (Signed)
   SUBJECTIVE: The patient is doing well today.  At this time, he denies chest pain, shortness of breath, or any new concerns.  Marland Kitchen atorvastatin  10 mg Oral QPM  . diltiazem  180 mg Oral QPM  . dofetilide  500 mcg Oral BID  . fluticasone  1 spray Each Nare q morning - 10a  . rivaroxaban  20 mg Oral Q supper  . sodium chloride  3 mL Intravenous Q12H      OBJECTIVE: Physical Exam: Filed Vitals:   07/04/14 1439 07/04/14 2058 07/05/14 0515  BP: 120/86 131/93 126/91  Pulse: 108 91 89  Temp: 99.1 F (37.3 C) 98.3 F (36.8 C) 97.9 F (36.6 C)  TempSrc: Oral Oral Oral  Resp: 18 16 18   Height: 5' 10.5" (1.791 m)    Weight: 185 lb 3 oz (84 kg)  185 lb 13.6 oz (84.3 kg)  SpO2: 98% 94% 98%    Intake/Output Summary (Last 24 hours) at 07/05/14 0923 Last data filed at 07/04/14 1730  Gross per 24 hour  Intake    240 ml  Output      0 ml  Net    240 ml    Telemetry reveals afib  GEN- The patient is well appearing, alert and oriented x 3 today.   Head- normocephalic, atraumatic Eyes-  Sclera clear, conjunctiva pink Ears- hearing intact Oropharynx- clear Neck- supple, no JVP Lymph- no cervical lymphadenopathy Lungs- Clear to ausculation bilaterally, normal work of breathing Heart- irregular rate and rhythm, no murmurs, rubs or gallops, PMI not laterally displaced GI- soft, NT, ND, + BS Extremities- no clubbing, cyanosis, or edema Skin- no rash or lesion Psych- euthymic mood, full affect Neuro- strength and sensation are intact  LABS: Basic Metabolic Panel:  Recent Labs  07/04/14 1004  NA 136  K 4.0  CL 102  CO2 28  GLUCOSE 99  BUN 18  CREATININE 0.90  CALCIUM 9.6  MG 1.9   ASSESSMENT AND PLAN:  Principal Problem:   Atrial fibrillation with rapid ventricular response Active Problems:   Essential hypertension   Chronic anticoagulation - Xarelto  1. afib Remains in afib Qt is stable Anticipate cardioversion tomorrow if still in afib Continue xarelto  2.  htn Stable No change required today   Thompson Grayer, MD 07/05/2014 7:52 AM

## 2014-07-06 ENCOUNTER — Encounter (HOSPITAL_COMMUNITY)
Admission: AD | Disposition: A | Discharge status: Home / Self Care | Payer: Self-pay | Source: Ambulatory Visit | Attending: Internal Medicine

## 2014-07-06 LAB — BASIC METABOLIC PANEL
Anion gap: 6 (ref 5–15)
BUN: 13 mg/dL (ref 6–23)
CO2: 29 mmol/L (ref 19–32)
Calcium: 9.5 mg/dL (ref 8.4–10.5)
Chloride: 105 mmol/L (ref 96–112)
Creatinine, Ser: 0.75 mg/dL (ref 0.50–1.35)
GFR calc non Af Amer: >90 mL/min (ref >90)
GLUCOSE: 102 mg/dL — AB (ref 70–99)
Potassium: 4.1 mmol/L (ref 3.5–5.1)
SODIUM: 140 mmol/L (ref 135–145)

## 2014-07-06 LAB — MAGNESIUM: Magnesium: 2 mg/dL (ref 1.5–2.5)

## 2014-07-06 SURGERY — CARDIOVERSION
Anesthesia: Monitor Anesthesia Care

## 2014-07-06 NOTE — Interval H&P Note (Signed)
History and Physical Interval Note:  07/06/2014 10:55 AM  Holland Falling  has presented today for surgery, with the diagnosis of A FIB  The various methods of treatment have been discussed with the patient and family. After consideration of risks, benefits and other options for treatment, the patient has consented to  Procedure(s): CARDIOVERSION (N/A) as a surgical intervention .  The patient's history has been reviewed, patient examined, no change in status, stable for surgery.  I have reviewed the patient's chart and labs.  Questions were answered to the patient's satisfaction.     Nahser, Wonda Cheng

## 2014-07-06 NOTE — Progress Notes (Signed)
   SUBJECTIVE: The patient is doing well today.  At this time, he denies chest pain, shortness of breath, or any new concerns.  Admitted for Tikosyn load, converted to SR after 2 doses of Tikosyn   CURRENT MEDICATIONS: . atorvastatin  10 mg Oral QPM  . diltiazem  180 mg Oral QPM  . dofetilide  500 mcg Oral BID  . fluticasone  1 spray Each Nare q morning - 10a  . rivaroxaban  20 mg Oral Q supper  . sodium chloride  3 mL Intravenous Q12H      OBJECTIVE: Physical Exam: Filed Vitals:   07/05/14 0515 07/05/14 1534 07/05/14 2111 07/06/14 0533  BP: 126/91 134/91 121/74 108/77  Pulse: 89 74 65 68  Temp: 97.9 F (36.6 C) 97.6 F (36.4 C) 97.9 F (36.6 C) 98.7 F (37.1 C)  TempSrc: Oral Oral Oral Oral  Resp: 18  18 18   Height:      Weight: 185 lb 13.6 oz (84.3 kg)   185 lb 14.4 oz (84.324 kg)  SpO2: 98% 100% 98% 98%    Intake/Output Summary (Last 24 hours) at 07/06/14 4010 Last data filed at 07/05/14 1254  Gross per 24 hour  Intake    600 ml  Output      0 ml  Net    600 ml    Telemetry reveals SR with PVC's  GEN- The patient is well appearing, alert and oriented x 3 today.   Head- normocephalic, atraumatic Eyes-  Sclera clear, conjunctiva pink Ears- hearing intact Oropharynx- clear Neck- supple, no JVP Lymph- no cervical lymphadenopathy Lungs- Clear to ausculation bilaterally, normal work of breathing Heart- regular rate and rhythm, no murmurs, rubs or gallops, PMI not laterally displaced GI- soft, NT, ND, + BS Extremities- no clubbing, cyanosis, or edema Skin- no rash or lesion Psych- euthymic mood, full affect Neuro- strength and sensation are intact  LABS: Basic Metabolic Panel:  Recent Labs  07/04/14 1004 07/05/14 0530  NA 136 139  K 4.0 4.0  CL 102 106  CO2 28 25  GLUCOSE 99 101*  BUN 18 15  CREATININE 0.90 0.82  CALCIUM 9.6 9.2  MG 1.9 2.1   ASSESSMENT AND PLAN:  Principal Problem:   Atrial fibrillation with rapid ventricular response Active  Problems:   Essential hypertension   Chronic anticoagulation - Xarelto  1. afib Converted to SR yesterday Qt is stable BMET, Mg pending this morning Continue xarelto  2. htn Stable No change required today  Anticipate discharge tomorrow after morning dose of Tikosyn.  Chanetta Marshall, NP 07/06/2014 6:30 AM  I have seen, examined the patient, and reviewed the above assessment and plan.  Changes to above are made where necessary.  On exam, RRR and ambulatory.  QT is stable.  No changes today.  Anticipate discharge in am.  Co Sign: Thompson Grayer, MD 07/06/2014 7:24 AM

## 2014-07-06 NOTE — H&P (View-Only) (Signed)
   SUBJECTIVE: The patient is doing well today.  At this time, he denies chest pain, shortness of breath, or any new concerns.  Admitted for Tikosyn load, converted to SR after 2 doses of Tikosyn   CURRENT MEDICATIONS: . atorvastatin  10 mg Oral QPM  . diltiazem  180 mg Oral QPM  . dofetilide  500 mcg Oral BID  . fluticasone  1 spray Each Nare q morning - 10a  . rivaroxaban  20 mg Oral Q supper  . sodium chloride  3 mL Intravenous Q12H      OBJECTIVE: Physical Exam: Filed Vitals:   07/05/14 0515 07/05/14 1534 07/05/14 2111 07/06/14 0533  BP: 126/91 134/91 121/74 108/77  Pulse: 89 74 65 68  Temp: 97.9 F (36.6 C) 97.6 F (36.4 C) 97.9 F (36.6 C) 98.7 F (37.1 C)  TempSrc: Oral Oral Oral Oral  Resp: 18  18 18   Height:      Weight: 185 lb 13.6 oz (84.3 kg)   185 lb 14.4 oz (84.324 kg)  SpO2: 98% 100% 98% 98%    Intake/Output Summary (Last 24 hours) at 07/06/14 9702 Last data filed at 07/05/14 1254  Gross per 24 hour  Intake    600 ml  Output      0 ml  Net    600 ml    Telemetry reveals SR with PVC's  GEN- The patient is well appearing, alert and oriented x 3 today.   Head- normocephalic, atraumatic Eyes-  Sclera clear, conjunctiva pink Ears- hearing intact Oropharynx- clear Neck- supple, no JVP Lymph- no cervical lymphadenopathy Lungs- Clear to ausculation bilaterally, normal work of breathing Heart- regular rate and rhythm, no murmurs, rubs or gallops, PMI not laterally displaced GI- soft, NT, ND, + BS Extremities- no clubbing, cyanosis, or edema Skin- no rash or lesion Psych- euthymic mood, full affect Neuro- strength and sensation are intact  LABS: Basic Metabolic Panel:  Recent Labs  07/04/14 1004 07/05/14 0530  NA 136 139  K 4.0 4.0  CL 102 106  CO2 28 25  GLUCOSE 99 101*  BUN 18 15  CREATININE 0.90 0.82  CALCIUM 9.6 9.2  MG 1.9 2.1   ASSESSMENT AND PLAN:  Principal Problem:   Atrial fibrillation with rapid ventricular response Active  Problems:   Essential hypertension   Chronic anticoagulation - Xarelto  1. afib Converted to SR yesterday Qt is stable BMET, Mg pending this morning Continue xarelto  2. htn Stable No change required today  Anticipate discharge tomorrow after morning dose of Tikosyn.  Chanetta Marshall, NP 07/06/2014 6:30 AM  I have seen, examined the patient, and reviewed the above assessment and plan.  Changes to above are made where necessary.  On exam, RRR and ambulatory.  QT is stable.  No changes today.  Anticipate discharge in am.  Co Sign: Thompson Grayer, MD 07/06/2014 7:24 AM

## 2014-07-06 NOTE — Discharge Summary (Signed)
ELECTROPHYSIOLOGY PROCEDURE DISCHARGE SUMMARY    Patient ID: Jorge [name],  MRN: [id number], DOB/AGE: [birth year] 49 y.o.  Admit date: 07/04/2014 Discharge date: 07/07/2014  Primary Care Physician: Miguel Aschoff, MD Electrophysiologist: Jaylynne Birkhead  Primary Discharge Diagnosis:  1.  Persistent atrial fibrillation status post Tikosyn loading this admission  Secondary Discharge Diagnosis:  1.  Hypertension 2.  Hyperlipidemia   Allergies  Allergen Reactions  . Oxycodone Nausea And Vomiting  . Vicodin [Hydrocodone-Acetaminophen] Nausea And Vomiting    Can take regular Tylenol     Procedures This Admission:  1.  Tikosyn loading  Brief HPI: Jorge [name] is a 49 y.o. male with a past medical history as noted above.  He has undergone 2 prior PVI's with the most recent being 03/2014.  He has had recurrent atrial fibrillation. He has previously failed Flecainide, Multaq, and amiodarone. Risks, benefits, and alternatives to Tikosyn were reviewed with the patient who wished to proceed.    Hospital Course:  The patient was admitted and Tikosyn was initiated.  Renal function and electrolytes were followed during the hospitalization.  Their QTc remained stable. He was monitored until discharge on telemetry which demonstrated sinus rhythm.  On the day of discharge, they were examined by Dr Rayann Heman who considered them stable for discharge to home.  Follow-up has been arranged with Roderic Palau, NP in 1 week and with Dr Rayann Heman in 4 weeks.   Physical Exam: Filed Vitals:   07/06/14 0813 07/06/14 1517 07/06/14 2118 07/07/14 0500  BP: 100/60 133/88 124/80 124/81  Pulse: 58 73 69 73  Temp: 98.1 F (36.7 C) 98.8 F (37.1 C) 98 F (36.7 C) 98.3 F (36.8 C)  TempSrc: Oral Oral Oral Oral  Resp: 16 16 16 16   Height:      Weight:    189 lb 3.2 oz (85.821 kg)  SpO2: 99% 99% 98% 97%    GEN- The patient is well appearing, alert and oriented x 3 today.   HEENT: normocephalic, atraumatic;  sclera clear, conjunctiva pink; hearing intact; oropharynx clear; neck supple, no JVP Lymph- no cervical lymphadenopathy Lungs- Clear to ausculation bilaterally, normal work of breathing.  No wheezes, rales, rhonchi Heart- Regular rate and rhythm, no murmurs, rubs or gallops, PMI not laterally displaced GI- soft, non-tender, non-distended, bowel sounds present, no hepatosplenomegaly Extremities- no clubbing, cyanosis, or edema; DP/PT/radial pulses 2+ bilaterally MS- no significant deformity or atrophy Skin- warm and dry, no rash or lesion Psych- euthymic mood, full affect Neuro- strength and sensation are intact   Labs:   Lab Results  Component Value Date   WBC 7.6 03/21/2014   HGB 13.3 03/21/2014   HCT 39.9 03/21/2014   MCV 90.4 03/21/2014   PLT 272.0 03/21/2014     Recent Labs Lab 07/06/14 0610  NA 140  K 4.1  CL 105  CO2 29  BUN 13  CREATININE 0.75  CALCIUM 9.5  GLUCOSE 102*     Discharge Medications:    Medication List    TAKE these medications        acetaminophen 500 MG tablet  Commonly known as:  TYLENOL  Take 1,000 mg by mouth every 6 (six) hours as needed for headache (pain).     atorvastatin 10 MG tablet  Commonly known as:  LIPITOR  Take 10 mg by mouth every evening.     diltiazem 180 MG 24 hr capsule  Commonly known as:  CARDIZEM CD  Take 1 capsule (180 mg total) by mouth  daily.     dofetilide 500 MCG capsule  Commonly known as:  TIKOSYN  Take 1 capsule (500 mcg total) by mouth 2 (two) times daily.     fluticasone 50 MCG/ACT nasal spray  Commonly known as:  FLONASE  Place 1 spray into both nostrils every morning.     ibuprofen 200 MG tablet  Commonly known as:  ADVIL,MOTRIN  Take 800 mg by mouth every 8 (eight) hours as needed (headache and knee pain).     rivaroxaban 20 MG Tabs tablet  Commonly known as:  XARELTO  Take 1 tablet (20 mg total) by mouth daily with supper.        Disposition:  Discharge Instructions    Diet - low  sodium heart healthy    Complete by:  As directed      Increase activity slowly    Complete by:  As directed           Follow-up Information    Follow up with CARROLL,DONNA, NP On 07/14/2014.   Specialty:  Nurse Practitioner   Why:  at [place] [facility] information:   [place] [address] [phone]       Follow up with Thompson Grayer, MD On 08/03/2014.   Specialty:  Cardiology   Why:  at 12:30   Contact information:   [place] [address] [phone]       Duration of Discharge Encounter: Greater than 30 minutes including physician time.  Signed, Chanetta Marshall, NP 07/07/2014 8:01 AM     I have seen, examined the patient, and reviewed the above assessment and plan.  Changes to above are made where necessary.  QT stable.  No arrhythmias.  Maintaining sinus rhythm with tikosyn.  Will need BMET, MG and ekg in 1 week (with Roderic Palau NP) and follow-up with me 08/03/14  Co Sign: Thompson Grayer, MD 07/07/2014 8:23 AM

## 2014-07-07 LAB — BASIC METABOLIC PANEL
ANION GAP: 3 — AB (ref 5–15)
BUN: 13 mg/dL (ref 6–23)
CHLORIDE: 107 mmol/L (ref 96–112)
CO2: 26 mmol/L (ref 19–32)
CREATININE: 0.83 mg/dL (ref 0.50–1.35)
Calcium: 8.9 mg/dL (ref 8.4–10.5)
GFR calc Af Amer: >90 mL/min (ref >90)
GFR calc non Af Amer: >90 mL/min (ref >90)
GLUCOSE: 100 mg/dL — AB (ref 70–99)
Potassium: 3.8 mmol/L (ref 3.5–5.1)
Sodium: 136 mmol/L (ref 135–145)

## 2014-07-07 LAB — MAGNESIUM: MAGNESIUM: 2 mg/dL (ref 1.5–2.5)

## 2014-07-07 MED ORDER — DOFETILIDE 500 MCG PO CAPS: 500.0000 ug | ORAL_CAPSULE | Freq: Two times a day (BID) | ORAL | Status: DC

## 2014-07-07 NOTE — Progress Notes (Signed)
Notified Jorge Marshall NP pt back in afib HR 90-110. Pt asymptomatic. Amber to see pt. Carroll Kinds RN

## 2014-07-07 NOTE — Progress Notes (Signed)
Pt with recurrent AF prior to discharge.  Relatively asymptomatic.  QTc stable in SR.  Discussed with Dr Rayann Heman, plan to discharge home on tikosyn and follow AF burden.  He will keep follow up appt next week with Roderic Palau, NP.  Chanetta Marshall, NP 07/07/2014 11:27 AM  Thompson Grayer MD 07/07/2014 1:25 PM

## 2014-07-14 ENCOUNTER — Encounter: Payer: Self-pay | Admitting: Nurse Practitioner

## 2014-07-14 ENCOUNTER — Ambulatory Visit (INDEPENDENT_AMBULATORY_CARE_PROVIDER_SITE_OTHER): Payer: BC Managed Care – PPO | Admitting: Nurse Practitioner

## 2014-07-14 VITALS — BP 120/88 | HR 84 | Ht 70.0 in | Wt 188.8 lb

## 2014-07-14 DIAGNOSIS — I480 Paroxysmal atrial fibrillation: Secondary | ICD-10-CM

## 2014-07-14 NOTE — Progress Notes (Signed)
PCP: Miguel Aschoff, MD  Jorge [name] is a 49 y.o. male who presents today for f/u in afib clinic. followup.  He was d/c 3/10 after loading of Tikosyn. Pt reports that he converted after 2 pills but went back into afib just prior to d/c. He says that he has been in and out of afib since d/c. Has had some very good days in rhythm and some days when he heart was out of rhythm and he felt bad. He feels chest pressure when he is  out of rhythm,but not with exertional activities.Bmet and Mag reviewed from 3/10, with Mag at 2.0 and Kt 3.8.Has appointment with Dr. Rayann Heman 4/6.  Today EKG shows SR with a QTc of 438ms.  His exercise tolerance is preserved.  Previously failed flecainide, multaq, and amiodarone prior to his most recent ablation 12/1.Marland Kitchen Continues xarelto without bleeding issues.  Today, he denies symptoms of chest pain, shortness of breath,  lower extremity edema, dizziness, presyncope, or syncope.  The patient is otherwise without complaint today.   Past Medical History  Diagnosis Date  . Atrial fibrillation     first diagnosed 2011; s/p DCCV at that time; ablation 10-2013 & 03/2014; failed multaq, flecainide and amiodarone  . Hyperlipidemia   . Hypertension   . Sinusitis   . Headache   . Chronic anticoagulation     Xarelto  . Dysrhythmia    Past Surgical History  Procedure Laterality Date  . Cardioversion      Hx of  . Appendectomy  1975  . Rhinoplasty  2006    Dr. Nadeen [name], [facility]  . Knee surgery  2009    [facility]  . Hernia repair      1.30.15  . Cardioversion N/A 10/15/2013    Procedure: CARDIOVERSION;  Surgeon: Fay Records, MD;  Location: [place];  Service: Cardiovascular;  Laterality: N/A;  . Tee without cardioversion N/A 11/01/2013    Procedure: TRANSESOPHAGEAL ECHOCARDIOGRAM (TEE);  Surgeon: Candee Furbish, MD;  Location: [facility] ENDOSCOPY;  Service: Cardiovascular;  Laterality: N/A;  . Tee without cardioversion N/A 03/28/2014   Procedure: TRANSESOPHAGEAL ECHOCARDIOGRAM (TEE);  Surgeon: Larey Dresser, MD;  Location: [facility] ENDOSCOPY;  Service: Cardiovascular;  Laterality: N/A;  . Atrial fibrillation ablation N/A 11/02/2013    PVI by Dr Rayann Heman  . Atrial fibrillation ablation N/A 03/29/2014    PVI and CTI by Dr Rayann Heman    ROS- all systems are reviewed and negatives except as per HPI above  Current Outpatient Prescriptions  Medication Sig Dispense Refill  . acetaminophen (TYLENOL) 500 MG tablet Take 1,000 mg by mouth every 6 (six) hours as needed for headache (pain).     Marland Kitchen atorvastatin (LIPITOR) 10 MG tablet Take 10 mg by mouth every evening.     . diltiazem (CARDIZEM CD) 180 MG 24 hr capsule Take 1 capsule (180 mg total) by mouth daily. (Patient taking differently: Take 180 mg by mouth every evening. ) 90 capsule 3  . dofetilide (TIKOSYN) 500 MCG capsule Take 1 capsule (500 mcg total) by mouth 2 (two) times daily. 180 capsule 0  . fluticasone (FLONASE) 50 MCG/ACT nasal spray Place 1 spray into both nostrils every morning.     Marland Kitchen ibuprofen (ADVIL,MOTRIN) 200 MG tablet Take 800 mg by mouth every 8 (eight) hours as needed (headache and knee pain).     . rivaroxaban (XARELTO) 20 MG TABS tablet Take 1 tablet (20 mg total) by mouth daily with supper. [place]  tablet 11   No current facility-administered medications for this visit.    Physical Exam: Filed Vitals:   07/14/14 0925  BP: 120/88  Pulse: 84  Height: 5\' 10"  (1.778 m)  Weight: 188 lb 12.8 oz (85.639 kg)    GEN- The patient is well appearing, alert and oriented x 3 today.   Head- normocephalic, atraumatic Eyes-  Sclera clear, conjunctiva pink Ears- hearing intact Oropharynx- clear Lungs- Clear to ausculation bilaterally, normal work of breathing Heart- irregular rate and rhythm, no murmurs, rubs or gallops, PMI not laterally displaced GI- soft, NT, ND, + BS Extremities- no clubbing, cyanosis, or edema  ekg reveals NSR at 84 bpm, QTc 44ms. Epic records reviewed  re recent hospitalization.  Assessment and Plan:  1. afib Very refractory afib despite 3 prior AADs and 2 prior ablations.  In SR today with recent Tikosyn load but feels he still goes in and out.  chads2vasc score is 1. He is compliant with anticoagulation.  2. HTN Stable No change required today  3. F/u with Dr. Rayann Heman as scheduled, sooner if needed.  Roderic Palau, NP

## 2014-08-03 ENCOUNTER — Ambulatory Visit (INDEPENDENT_AMBULATORY_CARE_PROVIDER_SITE_OTHER): Payer: BC Managed Care – PPO | Admitting: Internal Medicine

## 2014-08-03 ENCOUNTER — Encounter: Payer: Self-pay | Admitting: Internal Medicine

## 2014-08-03 VITALS — BP 108/72 | HR 83 | Ht 70.0 in | Wt 189.4 lb

## 2014-08-03 DIAGNOSIS — I10 Essential (primary) hypertension: Secondary | ICD-10-CM | POA: Diagnosis not present

## 2014-08-03 DIAGNOSIS — I4819 Other persistent atrial fibrillation: Secondary | ICD-10-CM

## 2014-08-03 DIAGNOSIS — I481 Persistent atrial fibrillation: Secondary | ICD-10-CM

## 2014-08-03 NOTE — Progress Notes (Signed)
PCP: Miguel Aschoff, MD  Abe People BOW BUNTYN is a 49 y.o. male who presents today for routine electrophysiology followup.  His AF is much better controlled with tikosyn.  Episodes are now rare and short.  Typically, episodes are associated with lack of sleep or stressful work related issues.  Today, he denies symptoms of chest pain, shortness of breath,  lower extremity edema, dizziness, presyncope, or syncope.  The patient is otherwise without complaint today.   Past Medical History  Diagnosis Date  . Atrial fibrillation     first diagnosed 2011; s/p DCCV at that time; ablation 10-2013 & 03/2014; failed multaq, flecainide and amiodarone  . Hyperlipidemia   . Hypertension   . Sinusitis   . Headache   . Chronic anticoagulation     Xarelto  . Dysrhythmia    Past Surgical History  Procedure Laterality Date  . Cardioversion      Hx of  . Appendectomy  1975  . Rhinoplasty  2006    Dr. Nadeen Landau, [facility]  . Knee surgery  2009    [facility]  . Hernia repair      1.30.15  . Cardioversion N/A 10/15/2013    Procedure: CARDIOVERSION;  Surgeon: Fay Records, MD;  Location: [place];  Service: Cardiovascular;  Laterality: N/A;  . Tee without cardioversion N/A 11/01/2013    Procedure: TRANSESOPHAGEAL ECHOCARDIOGRAM (TEE);  Surgeon: Candee Furbish, MD;  Location: [facility] ENDOSCOPY;  Service: Cardiovascular;  Laterality: N/A;  . Tee without cardioversion N/A 03/28/2014    Procedure: TRANSESOPHAGEAL ECHOCARDIOGRAM (TEE);  Surgeon: Larey Dresser, MD;  Location: [facility] ENDOSCOPY;  Service: Cardiovascular;  Laterality: N/A;  . Atrial fibrillation ablation N/A 11/02/2013    PVI by Dr Rayann Heman  . Atrial fibrillation ablation N/A 03/29/2014    PVI and CTI by Dr Rayann Heman    ROS- all systems are reviewed and negatives except as per HPI above  Current Outpatient Prescriptions  Medication Sig Dispense Refill  . acetaminophen (TYLENOL) 500 MG tablet Take 1,000 mg by mouth every 6 (six) hours as  needed for headache (pain).     Marland Kitchen atorvastatin (LIPITOR) 10 MG tablet Take 10 mg by mouth every evening.     . diltiazem (CARDIZEM CD) 180 MG 24 hr capsule Take 1 capsule (180 mg total) by mouth daily. (Patient taking differently: Take 180 mg by mouth every evening. ) 90 capsule 3  . dofetilide (TIKOSYN) 500 MCG capsule Take 1 capsule (500 mcg total) by mouth 2 (two) times daily. 180 capsule 0  . fluticasone (FLONASE) 50 MCG/ACT nasal spray Place 1 spray into both nostrils every morning.     Marland Kitchen ibuprofen (ADVIL,MOTRIN) 200 MG tablet Take 800 mg by mouth every 8 (eight) hours as needed (headache and knee pain).     . rivaroxaban (XARELTO) 20 MG TABS tablet Take 1 tablet (20 mg total) by mouth daily with supper. 30 tablet 11   No current facility-administered medications for this visit.    Physical Exam: Filed Vitals:   08/03/14 1243  BP: 108/72  Pulse: 83  Height: 5\' 10"  (1.778 m)  Weight: 189 lb 6.4 oz (85.911 kg)    GEN- The patient is well appearing, alert and oriented x 3 today.   Head- normocephalic, atraumatic Eyes-  Sclera clear, conjunctiva pink Ears- hearing intact Oropharynx- clear Lungs- Clear to ausculation bilaterally, normal work of breathing Heart- irregular rate and rhythm, no murmurs, rubs or gallops, PMI not laterally displaced GI- soft, NT, ND, + BS  Extremities- no clubbing, cyanosis, or edema  ekg reveals sinus rhythm, stable QTc  Assessment and Plan:  1. afib Stable No changes at this time.  chads2vasc score is 1. He is compliant with anticoagulation.  Consider stopping xarelto if afib remains stable on return. Bmet, mg on return  2. HTN Stable No change required today  Thompson Grayer MD 08/03/2014 2:41 PM

## 2014-08-03 NOTE — Patient Instructions (Signed)
Your physician wants you to follow-up in: 3 months with Dr. Rayann Heman.  You will receive a reminder letter in the mail two months in advance. If you don't receive a letter, please call our office to schedule the follow-up appointment.  Your physician recommends that you continue on your current medications as directed. Please refer to the Current Medication list given to you today.

## 2014-08-20 NOTE — Op Note (Signed)
PATIENT NAME:  Jorge [name], Jorge [name] MR#:  [id number] DATE OF BIRTH:  [birth year]  DATE OF PROCEDURE:  05/28/2013  PREOPERATIVE DIAGNOSIS: Left inguinal hernia.   POSTOPERATIVE DIAGNOSIS:  Left inguinal hernia.  OPERATION: Robotic-assisted laparoscopic left inguinal hernia repair.   ANESTHESIA: General.   SURGEON: Rodena Goldmann, MD    OPERATIVE PROCEDURE: With the patient in the supine position after the induction of appropriate general anesthesia and placement of a Foley catheter, the patient's abdomen was prepped with ChloraPrep and draped with sterile towels. The patient was placed in the head down, feet up position. A small infraumbilical incision was made in the standard fashion and carried down bluntly through the subcutaneous tissue. A Veress needle was used to cannulate the peritoneal cavity. CO2 was insufflated to appropriate pressure measurements. When approximately 2.5 liters of CO2 were instilled, the Veress needle was withdrawn, an 11 mm [employer] port was inserted into the peritoneal cavity. Intraperitoneal position was confirmed and CO2 was reinsufflated. The patient was left in Trendelenburg position, and 2  lateral ports, 8.5 mm in size, were placed under direct vision. An assistant port, 11 mm in size, was placed in the left upper quadrant. The robot was brought to the table, docked to the ports. Instruments inserted under direct vision, and I moved to the console. The defect appeared to be  a direct defect. The peritoneum was taken down from the lateral umbilical fold across the epigastric vessels. It was dissected free from the cord structures. The defect appeared to be direct without evidence of significant indirect component. After satisfactory exposure, a piece of Bard ultralight mesh was brought to the table, measured, placed into the abdominal cavity through the assistance port and placed in the preperitoneal space, sutured in place with 3-0 Vicryl. The peritoneum was then closed  with a locking V-Loc suture. The repair appeared to be satisfactory. Instruments were withdrawn under direct vision. The robot was undocked. The ports were removed. The abdomen desufflated. Skin incisions were closed with 5-0 nylon. The area infiltrated with 0.25% Marcaine for postoperative pain control. Sterile dressings were applied. The patient was returned to the recovery room, having tolerated the procedure well. Sponge, needle and instrument counts were correct x 2 in the operating room.     ____________________________ Rodena Goldmann III, MD rle:dmm D: 05/28/2013 09:47:00 ET T: 05/28/2013 10:00:02 ET JOB#: [id number]  cc: Rodena Goldmann III, MD, <Dictator> Richard L. Rosanna Randy, MD Rodena Goldmann MD ELECTRONICALLY SIGNED 05/28/2013 18:09

## 2014-09-25 ENCOUNTER — Other Ambulatory Visit: Payer: Self-pay | Admitting: Internal Medicine

## 2014-11-23 ENCOUNTER — Other Ambulatory Visit: Payer: Self-pay | Admitting: Internal Medicine

## 2014-11-24 ENCOUNTER — Other Ambulatory Visit: Payer: Self-pay

## 2014-11-24 MED ORDER — RIVAROXABAN 20 MG PO TABS: ORAL_TABLET | ORAL | Status: DC

## 2015-01-04 ENCOUNTER — Other Ambulatory Visit: Payer: Self-pay | Admitting: Internal Medicine

## 2015-01-13 ENCOUNTER — Encounter: Payer: Self-pay | Admitting: Internal Medicine

## 2015-01-13 ENCOUNTER — Telehealth: Payer: Self-pay | Admitting: Internal Medicine

## 2015-01-13 ENCOUNTER — Ambulatory Visit (INDEPENDENT_AMBULATORY_CARE_PROVIDER_SITE_OTHER): Payer: BC Managed Care – PPO | Admitting: Internal Medicine

## 2015-01-13 VITALS — BP 140/90 | HR 121 | Ht 70.0 in | Wt 182.0 lb

## 2015-01-13 DIAGNOSIS — I481 Persistent atrial fibrillation: Secondary | ICD-10-CM

## 2015-01-13 DIAGNOSIS — I10 Essential (primary) hypertension: Secondary | ICD-10-CM

## 2015-01-13 DIAGNOSIS — I4819 Other persistent atrial fibrillation: Secondary | ICD-10-CM

## 2015-01-13 NOTE — Patient Instructions (Addendum)
Medication Instructions:  Your physician recommends that you continue on your current medications as directed. Please refer to the Current Medication list given to you today.   Labwork: None ordered  Testing/Procedures: Your physician has requested that you have an echocardiogram. Echocardiography is a painless test that uses sound waves to create images of your heart. It provides your doctor with information about the size and shape of your heart and how well your heart's chambers and valves are working. This procedure takes approximately one hour. There are no restrictions for this procedure.    Follow-Up: Your physician recommends that you schedule a follow-up appointment in: 3 months with Dr Rayann Heman  EKG Tues with Claiborne Billings   Any Other Special Instructions Will Be Listed Below (If Applicable).

## 2015-01-13 NOTE — Telephone Encounter (Signed)
Per Dr Rayann Heman okay to hold Xarelto for 48 hours prior and restart at MD's discretion doing procedure

## 2015-01-13 NOTE — Telephone Encounter (Signed)
[place] is calling form [place] for surgical clearance:  1. What type of surgery is being performed? Colonoscopy   2. When is this surgery scheduled? 01-19-15  3. Are there any medications that need to be held prior to surgery and how long?    Xarelto   4. Name of physician performing surgery? Dr. Naaman Plummer   5. What is your office phone and fax number? Office  [phone]   Fax [phone]
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
## 2015-01-17 ENCOUNTER — Encounter: Payer: Self-pay | Admitting: Internal Medicine

## 2015-01-25 ENCOUNTER — Other Ambulatory Visit: Payer: Self-pay | Admitting: Internal Medicine

## 2015-01-26 ENCOUNTER — Ambulatory Visit (HOSPITAL_COMMUNITY): Payer: BC Managed Care – PPO | Attending: Cardiology

## 2015-01-26 ENCOUNTER — Other Ambulatory Visit: Payer: Self-pay

## 2015-01-26 DIAGNOSIS — E785 Hyperlipidemia, unspecified: Secondary | ICD-10-CM | POA: Insufficient documentation

## 2015-01-26 DIAGNOSIS — I481 Persistent atrial fibrillation: Secondary | ICD-10-CM

## 2015-01-26 DIAGNOSIS — I340 Nonrheumatic mitral (valve) insufficiency: Secondary | ICD-10-CM | POA: Insufficient documentation

## 2015-01-26 DIAGNOSIS — I351 Nonrheumatic aortic (valve) insufficiency: Secondary | ICD-10-CM | POA: Diagnosis not present

## 2015-01-26 DIAGNOSIS — I071 Rheumatic tricuspid insufficiency: Secondary | ICD-10-CM | POA: Diagnosis not present

## 2015-01-26 DIAGNOSIS — I4891 Unspecified atrial fibrillation: Secondary | ICD-10-CM | POA: Insufficient documentation

## 2015-01-26 DIAGNOSIS — I4819 Other persistent atrial fibrillation: Secondary | ICD-10-CM

## 2015-01-26 DIAGNOSIS — I10 Essential (primary) hypertension: Secondary | ICD-10-CM | POA: Insufficient documentation

## 2015-03-08 ENCOUNTER — Ambulatory Visit (INDEPENDENT_AMBULATORY_CARE_PROVIDER_SITE_OTHER): Payer: BC Managed Care – PPO | Admitting: Family Medicine

## 2015-03-08 ENCOUNTER — Encounter: Payer: Self-pay | Admitting: Family Medicine

## 2015-03-08 VITALS — BP 114/70 | HR 76 | Temp 97.6°F | Resp 14 | Ht 71.0 in | Wt 193.0 lb

## 2015-03-08 DIAGNOSIS — Z9889 Other specified postprocedural states: Secondary | ICD-10-CM | POA: Insufficient documentation

## 2015-03-08 DIAGNOSIS — Z0000 Encounter for general adult medical examination without abnormal findings: Secondary | ICD-10-CM

## 2015-03-08 DIAGNOSIS — K4090 Unilateral inguinal hernia, without obstruction or gangrene, not specified as recurrent: Secondary | ICD-10-CM | POA: Diagnosis not present

## 2015-03-08 DIAGNOSIS — J309 Allergic rhinitis, unspecified: Secondary | ICD-10-CM | POA: Insufficient documentation

## 2015-03-08 DIAGNOSIS — E785 Hyperlipidemia, unspecified: Secondary | ICD-10-CM | POA: Insufficient documentation

## 2015-03-08 LAB — POCT URINALYSIS DIPSTICK
BILIRUBIN UA: NEGATIVE
GLUCOSE UA: NEGATIVE
KETONES UA: NEGATIVE
Leukocytes, UA: NEGATIVE
Nitrite, UA: NEGATIVE
Protein, UA: NEGATIVE
RBC UA: NEGATIVE
Spec Grav, UA: 1.025
Urobilinogen, UA: 0.2
pH, UA: 6.5

## 2015-03-08 NOTE — Progress Notes (Signed)
Patient ID: Jorge [name], male   DOB: Oct 19, 1965, 49 y.o.   MRN: [id number]  Visit Date: 03/08/2015  Today's Provider: Wilhemena Durie, MD   Chief Complaint  Patient presents with  . Annual Exam   Subjective:  Jorge [name] is a 49 y.o. male who presents today for health maintenance and complete physical. He feels well. He reports exercising- does boot camp, running. He reports he is sleeping well. LAST: Colonoscopy 01/19/15-Dr. Karl[facility].-normal repeat in 10 years per patient.  Tdap 02/09/13  Never had shingels shot or pneumonia shot.      Review of Systems  Constitutional: Negative.   HENT: Positive for congestion and sinus pressure.   Eyes: Negative.   Respiratory: Negative.   Cardiovascular: Positive for palpitations.  Gastrointestinal: Negative.   Endocrine: Negative.   Genitourinary: Negative.   Musculoskeletal: Positive for back pain, arthralgias and neck pain.  Skin: Negative.   Allergic/Immunologic: Negative.   Neurological: Negative.   Hematological: Negative.   Psychiatric/Behavioral: Negative.     Social History   Social History  . Marital Status: Married    Spouse Name: N/A  . Number of Children: N/A  . Years of Education: N/A   Occupational History  . [employer] Server w/ [employer]    Social History Main Topics  . Smoking status: Never Smoker   . Smokeless tobacco: Never Used  . Alcohol Use: 1.2 oz/week    2 Cans of beer per week     Comment: occasionally  . Drug Use: No  . Sexual Activity: Yes   Other Topics Concern  . Not on file   Social History Narrative   Lives in [place] with his spouse.  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[employer]    Patient Active Problem List   Diagnosis Date Noted  . Allergic rhinitis 03/08/2015  . Personal history of surgery to heart and great vessels, presenting hazards to health 03/08/2015  . HLD (hyperlipidemia) 03/08/2015  . Atrial fibrillation with rapid ventricular  response ([place]) 07/04/2014  . Chronic anticoagulation - Xarelto   . Paroxysmal atrial fibrillation ([place]) 03/29/2014  . Essential hypertension 12/15/2013  . Atrial fibrillation ([place]) 06/13/2013    Past Surgical History  Procedure Laterality Date  . Cardioversion      Hx of  . Appendectomy  1975  . Rhinoplasty  2006    Dr. Nadeen Landau, [facility]  . Knee surgery  2009    [facility]  . Hernia repair      1.30.15  . Cardioversion N/A 10/15/2013    Procedure: CARDIOVERSION;  Surgeon: Fay Records, MD;  Location: [place];  Service: Cardiovascular;  Laterality: N/A;  . Tee without cardioversion N/A 11/01/2013    Procedure: TRANSESOPHAGEAL ECHOCARDIOGRAM (TEE);  Surgeon: Candee Furbish, MD;  Location: [facility] ENDOSCOPY;  Service: Cardiovascular;  Laterality: N/A;  . Tee without cardioversion N/A 03/28/2014    Procedure: TRANSESOPHAGEAL ECHOCARDIOGRAM (TEE);  Surgeon: Larey Dresser, MD;  Location: [facility] ENDOSCOPY;  Service: Cardiovascular;  Laterality: N/A;  . Atrial fibrillation ablation N/A 11/02/2013    PVI by Dr Rayann Heman  . Atrial fibrillation ablation N/A 03/29/2014    PVI and CTI by Dr Rayann Heman    His family history includes Atrial fibrillation in his maternal grandmother; CVA in his paternal grandmother; Cancer in his mother; Colon polyps in an other family member; Heart attack (age of onset: 70) in his maternal grandfather; Heart failure in his brother; Hypertension in his maternal  grandfather.    Outpatient Prescriptions Prior to Visit  Medication Sig Dispense Refill  . acetaminophen (TYLENOL) 500 MG tablet Take 1,000 mg by mouth every 6 (six) hours as needed for headache (pain).     Marland Kitchen atorvastatin (LIPITOR) 10 MG tablet Take 10 mg by mouth every evening.     . cetirizine (ZYRTEC) 10 MG tablet Take 10 mg by mouth daily as needed for allergies.   9  . diltiazem (CARDIZEM CD) 180 MG 24 hr capsule Take 180 mg by mouth every evening.    . fluticasone (FLONASE) 50 MCG/ACT nasal  spray Place 1 spray into both nostrils daily as needed for allergies.     Marland Kitchen ibuprofen (ADVIL,MOTRIN) 200 MG tablet Take 800 mg by mouth every 8 (eight) hours as needed (headache and knee pain).     . rivaroxaban (XARELTO) 20 MG TABS tablet TAKE 1 TABLET (20 MG TOTAL) BY MOUTH DAILY WITH SUPPER. 30 tablet 11  . TIKOSYN 500 MCG capsule TAKE ONE CAPSULE BY MOUTH TWICE A DAY 60 capsule 3   No facility-administered medications prior to visit.    Patient Care Team: Jerrol Banana., MD as PCP - General (Family Medicine)     Objective:   Vitals:  Filed Vitals:   03/08/15 0855  BP: 114/70  Pulse: 76  Temp: 97.6 F (36.4 C)  Resp: 14  Height: 5\' 11"  (1.803 m)  Weight: 193 lb (87.544 kg)    Physical Exam  Constitutional: He is oriented to person, place, and time. He appears well-developed and well-nourished.  HENT:  Head: Normocephalic and atraumatic.  Eyes: Conjunctivae are normal. Pupils are equal, round, and reactive to light.  Neck: Normal range of motion. Neck supple.  Cardiovascular: Normal rate, regular rhythm, normal heart sounds and intact distal pulses.   No murmur heard. Pulmonary/Chest: Effort normal and breath sounds normal. No respiratory distress. He has no wheezes. He has no rales.  Abdominal: Soft. Bowel sounds are normal.  Genitourinary: Prostate normal and penis normal. No penile tenderness.  Musculoskeletal: Normal range of motion. He exhibits no edema or tenderness.  Neurological: He is alert and oriented to person, place, and time.  Skin: Skin is warm and dry. No rash noted. No erythema.  Psychiatric: He has a normal mood and affect. His behavior is normal. Judgment and thought content normal.   inguinal hernia noted, easily reducible.   Depression Screen PHQ 2/9 Scores 03/08/2015  PHQ - 2 Score 0      Assessment & Plan:  1. Annual physical exam Recent labs through work are stable. Had his his Colonoscopy in September 2016, no DRE today. Up to date  on immunizations at this time. - POCT urinalysis dipstick Patient are ready had his flu shot. Return to clinic 1 year. 2. Inguinal hernia without obstruction or gangrene, recurrence not specified, unspecified laterality - Ambulatory referral to General Surgery  3. Atrial fibrillation Pt status post cardioversion and ablation. I have done the exam and reviewed the above chart and it is accurate to the best of my knowledge.  Patient was seen and examined by Dr. [name] and note was scribed by Theressa Millard, RMA.

## 2015-03-21 ENCOUNTER — Telehealth: Payer: Self-pay | Admitting: Family Medicine

## 2015-03-21 ENCOUNTER — Other Ambulatory Visit: Payer: Self-pay

## 2015-03-21 MED ORDER — CETIRIZINE HCL 10 MG PO TABS: 10.0000 mg | ORAL_TABLET | Freq: Every day | ORAL | Status: DC | PRN

## 2015-03-21 MED ORDER — ATORVASTATIN CALCIUM 10 MG PO TABS: 10.0000 mg | ORAL_TABLET | Freq: Every evening | ORAL | Status: DC

## 2015-03-21 NOTE — Telephone Encounter (Signed)
Pt contacted office for refill request on the following medications: 1. atorvastatin (LIPITOR) 10 MG tablet   2. cetirizine (ZYRTEC) 10 MG tablet To [facility] [address] Pharmacy# [id number].  Pt stated that when he was in for his OV earlier this month he forgot to ask for the refills. Thanks TNP

## 2015-03-22 ENCOUNTER — Ambulatory Visit (INDEPENDENT_AMBULATORY_CARE_PROVIDER_SITE_OTHER): Payer: BC Managed Care – PPO | Admitting: General Surgery

## 2015-03-22 ENCOUNTER — Encounter: Payer: Self-pay | Admitting: General Surgery

## 2015-03-22 VITALS — BP 160/70 | HR 78 | Resp 12 | Ht 71.0 in | Wt 186.0 lb

## 2015-03-22 DIAGNOSIS — K4091 Unilateral inguinal hernia, without obstruction or gangrene, recurrent: Secondary | ICD-10-CM

## 2015-03-22 NOTE — Patient Instructions (Addendum)

## 2015-03-22 NOTE — Progress Notes (Signed)
Patient ID: Jorge [name], male   DOB: [birth year], 49 y.o.   MRN: [id number]  Chief Complaint  Patient presents with  . Other    inguinal hernia    HPI Jorge [name] is a 49 y.o. male here today for a evaluation of a left inguinal hernia. Patient states he had surgery in 2015 for his and its back. He states there is some discomfort with movement. The patient reports about 8 weeks after his original surgery he was squatting down and moving cinder blocks when he felt a "pop" in the left groin. Since then he is noticed gradual increase in size, with no pain. No difficulty with bowel or bladder function. HPI  Past Medical History  Diagnosis Date  . Atrial fibrillation ([place])     first diagnosed 2011; s/p DCCV at that time; ablation 10-2013 & 03/2014; failed multaq, flecainide and amiodarone  . Hyperlipidemia   . Hypertension   . Sinusitis   . Headache   . Chronic anticoagulation     Xarelto  . Dysrhythmia     Past Surgical History  Procedure Laterality Date  . Cardioversion      Hx of  . Appendectomy  1975  . Rhinoplasty  2006    Dr. Nadeen Landau, [facility]  . [facility] surgery  2009    [facility]  . Cardioversion N/A 10/15/2013    Procedure: CARDIOVERSION;  Surgeon: Fay Records, MD;  Location: [place];  Service: Cardiovascular;  Laterality: N/A;  . Tee without cardioversion N/A 11/01/2013    Procedure: TRANSESOPHAGEAL ECHOCARDIOGRAM (TEE);  Surgeon: Candee Furbish, MD;  Location: [facility] ENDOSCOPY;  Service: Cardiovascular;  Laterality: N/A;  . Tee without cardioversion N/A 03/28/2014    Procedure: TRANSESOPHAGEAL ECHOCARDIOGRAM (TEE);  Surgeon: Larey Dresser, MD;  Location: [facility] ENDOSCOPY;  Service: Cardiovascular;  Laterality: N/A;  . Atrial fibrillation ablation N/A 11/02/2013    PVI by Dr Rayann Heman  . Atrial fibrillation ablation N/A 03/29/2014    PVI and CTI by Dr Rayann Heman  . Colonoscopy  2016  . Hernia repair Left 05/28/2013    left inguinal henria repair      Family History  Problem Relation Age of Onset  . Colon polyps      Fam Hx  . Cancer Mother     breast, cause of death  . Heart attack Maternal Grandfather 53    Cause of death  . Hypertension Maternal Grandfather   . Heart failure Brother   . CVA Paternal Grandmother     cause of death  . Atrial fibrillation Maternal Grandmother     Social History Social History  Substance Use Topics  . Smoking status: Never Smoker   . Smokeless tobacco: Never Used  . Alcohol Use: 1.2 oz/week    2 Cans of beer per week     Comment: occasionally    Allergies  Allergen Reactions  . Oxycodone Nausea And Vomiting  . Vicodin [Hydrocodone-Acetaminophen] Nausea And Vomiting    Can take regular Tylenol    Current Outpatient Prescriptions  Medication Sig Dispense Refill  . acetaminophen (TYLENOL) 500 MG tablet Take 1,000 mg by mouth every 6 (six) hours as needed for headache (pain).     Marland Kitchen atorvastatin (LIPITOR) 10 MG tablet Take 1 tablet (10 mg total) by mouth every evening. 30 tablet 12  . cetirizine (ZYRTEC) 10 MG tablet Take 1 tablet (10 mg total) by mouth daily as needed for allergies. 30 tablet 12  . diltiazem (  CARDIZEM CD) 180 MG 24 hr capsule Take 180 mg by mouth every evening.    . fluticasone (FLONASE) 50 MCG/ACT nasal spray Place 1 spray into both nostrils daily as needed for allergies.     Marland Kitchen ibuprofen (ADVIL,MOTRIN) 200 MG tablet Take 800 mg by mouth every 8 (eight) hours as needed (headache and knee pain).     . rivaroxaban (XARELTO) 20 MG TABS tablet TAKE 1 TABLET (20 MG TOTAL) BY MOUTH DAILY WITH SUPPER. 30 tablet 11  . TIKOSYN 500 MCG capsule TAKE ONE CAPSULE BY MOUTH TWICE A DAY 60 capsule 3   No current facility-administered medications for this visit.    Review of Systems Review of Systems  Constitutional: Negative.   Respiratory: Negative.   Cardiovascular: Negative.     Blood pressure 160/70, pulse 78, resp. rate 12, height 5\' 11"  (1.803 m), weight 186 lb (84.369  kg).  Physical Exam Physical Exam  Constitutional: He is oriented to person, place, and time. He appears well-developed and well-nourished.  Eyes: Conjunctivae are normal. No scleral icterus.  Neck: Neck supple.  Cardiovascular: Normal rate, regular rhythm and normal heart sounds.   Pulmonary/Chest: Effort normal and breath sounds normal.  Abdominal: Soft. Normal appearance and bowel sounds are normal. There is no tenderness. A hernia is present. Hernia confirmed positive in the left inguinal area.  Lymphadenopathy:    He has no cervical adenopathy.  Neurological: He is alert and oriented to person, place, and time.  Skin: Skin is warm and dry.    Data Reviewed Cardiology note of 01/13/2015. The patient was in atrial fibrillation time of that exam. Plans for continued anticoagulation.  Operative note of January 2015: Robotic repair of left direct inguinal hernia with prosthetic mesh.  Assessment    Recurrent left inguinal hernia.    Plan    The patient will be following up with his cardiologist in December. He is going to discuss the possibility of discontinuing long-term anticoagulation at that time. In any event, he is aware that it will be necessary to come off his Xarelto 2 days prior to any surgical intervention to minimize excessive bleeding.  It would be appropriate for the patient to begin aspirin, 81 mg daily 1 week prior to the planned procedure to minimize his best as possible his risk for embolic stroke during the short period of time he'll be off Xarelto.  We will plan for an anterior repair, with additional mesh if indicated.   Hernia precautions and incarceration were discussed with the patient. If they develop symptoms of an incarcerated hernia, they were encouraged to seek prompt medical attention.  I have recommended repair of the hernia using mesh on an outpatient basis in the near future. The risk of infection was reviewed. The role of prosthetic mesh to  minimize the risk of recurrence was reviewed.  Patient wishes to have surgery completed in January 2017. He requests to check his schedule and call the office back when he would like to proceed. A pre-operative visit will not be required. This patient will need to discontinue Xarelto 2 days prior.   PCP:  Dewaine Conger 03/23/2015, 6:11 PM

## 2015-03-23 DIAGNOSIS — K4091 Unilateral inguinal hernia, without obstruction or gangrene, recurrent: Secondary | ICD-10-CM | POA: Insufficient documentation

## 2015-03-24 ENCOUNTER — Other Ambulatory Visit: Payer: Self-pay | Admitting: Family Medicine

## 2015-04-03 ENCOUNTER — Encounter: Payer: Self-pay | Admitting: Internal Medicine

## 2015-04-03 ENCOUNTER — Ambulatory Visit (INDEPENDENT_AMBULATORY_CARE_PROVIDER_SITE_OTHER): Payer: BC Managed Care – PPO | Admitting: Internal Medicine

## 2015-04-03 VITALS — BP 128/86 | HR 115 | Ht 71.0 in | Wt 193.0 lb

## 2015-04-03 DIAGNOSIS — I10 Essential (primary) hypertension: Secondary | ICD-10-CM | POA: Diagnosis not present

## 2015-04-03 DIAGNOSIS — Z7901 Long term (current) use of anticoagulants: Secondary | ICD-10-CM | POA: Diagnosis not present

## 2015-04-03 DIAGNOSIS — R0789 Other chest pain: Secondary | ICD-10-CM

## 2015-04-03 DIAGNOSIS — I480 Paroxysmal atrial fibrillation: Secondary | ICD-10-CM

## 2015-04-03 NOTE — Patient Instructions (Signed)
Medication Instructions:  Your physician recommends that you continue on your current medications as directed. Please refer to the Current Medication list given to you today.    Labwork: None ordered   Testing/Procedures:  Your physician has requested that you have an exercise tolerance test. For further information please visit [email or web address]. Please also follow instruction sheet, as given.  LINQ implant on 04/17/15.  Please arrive at the [facility] at 6:00am.  Okay to eat and drink prior and may take your medications the morning of implant    Follow-Up:  Your physician recommends that you schedule a follow-up appointment in: 7-14 days from 04/17/15 in device clinic for wound check  Your physician recommends that you schedule a follow-up appointment in: 3 months with Dr Rayann Heman   Any Other Special Instructions Will Be Listed Below (If Applicable).     If you need a refill on your cardiac medications before your next appointment, please call your pharmacy.

## 2015-04-03 NOTE — Progress Notes (Signed)
PCP: Wilhemena Durie, MD  Jorge [name] is a 49 y.o. male who presents today for routine electrophysiology followup.  His AF is much better controlled with tikosyn.  Episodes are now rare and short.  Typically, episodes are associated with lack of sleep or stressful work related issues. He does "boot camp" exercise 3 days per week without limitation.  He is in afib today and aware.  He feels quite certain that he was in sinus until this weekend.  He has mild chest tightness with afib but otherwise has preserved exercise tolerance. Today, he denies symptoms of chest pain, shortness of breath,  lower extremity edema, dizziness, presyncope, or syncope.  The patient is otherwise without complaint today.   Past Medical History  Diagnosis Date  . Atrial fibrillation ([place])     first diagnosed 2011; s/p DCCV at that time; ablation 10-2013 & 03/2014; failed multaq, flecainide and amiodarone  . Hyperlipidemia   . Hypertension   . Sinusitis   . Headache   . Chronic anticoagulation     Xarelto  . Dysrhythmia    Past Surgical History  Procedure Laterality Date  . Cardioversion      Hx of  . Appendectomy  1975  . Rhinoplasty  2006    Dr. Nadeen Landau, [facility]  . Knee surgery  2009    [facility]  . Cardioversion N/A 10/15/2013    Procedure: CARDIOVERSION;  Surgeon: Fay Records, MD;  Location: [place];  Service: Cardiovascular;  Laterality: N/A;  . Tee without cardioversion N/A 11/01/2013    Procedure: TRANSESOPHAGEAL ECHOCARDIOGRAM (TEE);  Surgeon: Candee Furbish, MD;  Location: [facility] ENDOSCOPY;  Service: Cardiovascular;  Laterality: N/A;  . Tee without cardioversion N/A 03/28/2014    Procedure: TRANSESOPHAGEAL ECHOCARDIOGRAM (TEE);  Surgeon: Larey Dresser, MD;  Location: [facility] ENDOSCOPY;  Service: Cardiovascular;  Laterality: N/A;  . Atrial fibrillation ablation N/A 11/02/2013    PVI by Dr Rayann Heman  . Atrial fibrillation ablation N/A 03/29/2014    PVI and CTI by Dr Rayann Heman  .  Colonoscopy  2016  . Hernia repair Left 05/28/2013    left inguinal henria repair     ROS- all systems are reviewed and negatives except as per HPI above  Current Outpatient Prescriptions  Medication Sig Dispense Refill  . acetaminophen (TYLENOL) 500 MG tablet Take 1,000 mg by mouth every 6 (six) hours as needed for headache (pain).     Marland Kitchen atorvastatin (LIPITOR) 10 MG tablet TAKE 1 TABLET BY MOUTH EVERY DAY 30 tablet 5  . cetirizine (ZYRTEC) 10 MG tablet Take 1 tablet (10 mg total) by mouth daily as needed for allergies. 30 tablet 12  . diltiazem (CARDIZEM CD) 180 MG 24 hr capsule Take 180 mg by mouth every evening.    . fluticasone (FLONASE) 50 MCG/ACT nasal spray Place 1 spray into both nostrils daily as needed for allergies.     Marland Kitchen ibuprofen (ADVIL,MOTRIN) 200 MG tablet Take 800 mg by mouth every 8 (eight) hours as needed (headache and knee pain).     . rivaroxaban (XARELTO) 20 MG TABS tablet TAKE 1 TABLET (20 MG TOTAL) BY MOUTH DAILY WITH SUPPER. 30 tablet 11  . TIKOSYN 500 MCG capsule TAKE ONE CAPSULE BY MOUTH TWICE A DAY 60 capsule 3   No current facility-administered medications for this visit.    Physical Exam: Filed Vitals:   04/03/15 1039  BP: 128/86  Pulse: 115  Height: 5\' 11"  (1.803 m)  Weight: 193 lb (  87.544 kg)    GEN- The patient is well appearing, alert and oriented x 3 today.   Head- normocephalic, atraumatic Eyes-  Sclera clear, conjunctiva pink Ears- hearing intact Oropharynx- clear Lungs- Clear to ausculation bilaterally, normal work of breathing Heart- irregular rate and rhythm, no murmurs, rubs or gallops, PMI not laterally displaced GI- soft, NT, ND, + BS Extremities- no clubbing, cyanosis, or edema  ekg reveals afib, stable QTc  Assessment and Plan:  1. afib I would like to better quantify his AF.  I am concerned that he has more af than he is aware.  I think that implantation of an implantable loop recorder for AF management post ablation would be  best.  This will allow for more accurate arrhythmia burden detection as well as better rate control and may also help [place] reduce the time that he is on anticoagulation.  chads2vasc score is 1. He is compliant with anticoagulation.  If we can demonstarte on ILR that his AF burden is low then I may be able to reduce anticoagulation, potentially using anticoagulation as a "pill in pocket" approach to facilitate cardioversion when required.   Risks of ILR implant were discussed at length with the patient who wishes to proceed at the next available time.  2. HTN Stable No change required today  3. Chest pressure during afib Likely due to RVR My suspicion for CAD is rather low Will proceed with plain treadmill test once back in sinus for further CV risk stratification  Thompson Grayer MD, [facility] 04/03/2015 8:39 PM

## 2015-04-17 ENCOUNTER — Ambulatory Visit (HOSPITAL_COMMUNITY)
Admission: RE | Admit: 2015-04-17 | Payer: BC Managed Care – PPO | Source: Ambulatory Visit | Admitting: Internal Medicine

## 2015-04-17 ENCOUNTER — Encounter (HOSPITAL_COMMUNITY): Admission: RE | Payer: Self-pay | Source: Ambulatory Visit

## 2015-04-17 SURGERY — LOOP RECORDER INSERTION
Anesthesia: LOCAL

## 2015-04-18 ENCOUNTER — Encounter: Payer: Self-pay | Admitting: Internal Medicine

## 2015-04-19 ENCOUNTER — Ambulatory Visit (INDEPENDENT_AMBULATORY_CARE_PROVIDER_SITE_OTHER): Payer: BC Managed Care – PPO

## 2015-04-19 ENCOUNTER — Encounter: Payer: BC Managed Care – PPO | Admitting: Nurse Practitioner

## 2015-04-19 DIAGNOSIS — R0789 Other chest pain: Secondary | ICD-10-CM

## 2015-04-19 LAB — EXERCISE TOLERANCE TEST
CSEPEDS: 0 s
CSEPEW: 13.1 METS
Exercise duration (min): 11 min
MPHR: 171 {beats}/min
Peak HR: 151 {beats}/min
Percent HR: 88 %
RPE: 13
Rest HR: 82 {beats}/min

## 2015-04-28 ENCOUNTER — Ambulatory Visit: Payer: BC Managed Care – PPO

## 2015-05-03 ENCOUNTER — Telehealth: Payer: Self-pay | Admitting: *Deleted

## 2015-05-03 NOTE — Telephone Encounter (Signed)
Patient's surgery has been scheduled for 05-31-15 at [facility].  This patient will discontinue Xarelto 2 days prior. Patient will also begin an 81 mg aspirin one week prior to surgery.   Patient instructed to call the office if he has further questions.

## 2015-05-12 ENCOUNTER — Encounter: Payer: Self-pay | Admitting: Internal Medicine

## 2015-05-15 ENCOUNTER — Other Ambulatory Visit: Payer: Self-pay | Admitting: General Surgery

## 2015-05-15 DIAGNOSIS — K4091 Unilateral inguinal hernia, without obstruction or gangrene, recurrent: Secondary | ICD-10-CM

## 2015-05-16 ENCOUNTER — Encounter: Payer: Self-pay | Admitting: Family Medicine

## 2015-05-18 ENCOUNTER — Encounter: Payer: Self-pay | Admitting: *Deleted

## 2015-05-18 ENCOUNTER — Other Ambulatory Visit: Payer: BC Managed Care – PPO

## 2015-05-18 NOTE — Patient Instructions (Signed)
  Your procedure is scheduled on: 05-31-15 Report to [place] To find out your arrival time please call [phone] between 1PM - 3PM on 05-30-15   Remember: Instructions that are not followed completely may result in serious medical risk, up to and including death, or upon the discretion of your surgeon and anesthesiologist your surgery may need to be rescheduled.    _X___ 1. Do not eat food or drink liquids after midnight. No gum chewing or hard candies.     _X___ 2. No Alcohol for 24 hours before or after surgery.   ____ 3. Bring all medications with you on the day of surgery if instructed.    ____ 4. Notify your doctor if there is any change in your medical condition     (cold, fever, infections).     Do not wear jewelry, make-up, hairpins, clips or nail polish.  Do not wear lotions, powders, or perfumes. You may wear deodorant.  Do not shave 48 hours prior to surgery. Men may shave face and neck.  Do not bring valuables to the hospital.    [facility] is not responsible for any belongings or valuables.               Contacts, dentures or bridgework may not be worn into surgery.  Leave your suitcase in the car. After surgery it may be brought to your room.  For patients admitted to the hospital, discharge time is determined by your treatment team.   Patients discharged the day of surgery will not be allowed to drive home.   Please read over the following fact sheets that you were given:     _X___ Take these medicines the morning of surgery with A SIP OF WATER:    1. TIKOSYN  2.   3.   4.  5.  6.  ____ Fleet Enema (as directed)   ____ Use CHG Soap as directed  ____ Use inhalers on the day of surgery  ____ Stop metformin 2 days prior to surgery    ____ Take 1/2 of usual insulin dose the night before surgery and none on the morning of surgery.   _X___ Stop Coumadin/Plavix/aspirin-PT TO STOP XARELTO 2 DAYS PRIOR TO SURGERY AND START 81  MG ASPIRIN 7 DAYS PRIOR TO SURGERY PER DR BYRNETTS H&P AND CMA NOTE FROM 05-03-15 IN EPIC  ____ Stop Anti-inflammatories   ____ Stop supplements until after surgery.    ____ Bring C-Pap to the hospital.

## 2015-05-30 NOTE — OR Nursing (Signed)
Per Rosann Auerbach at [facility] - Dr Bary Castilla will update H&P morning of surgery.

## 2015-05-31 ENCOUNTER — Ambulatory Visit: Payer: BC Managed Care – PPO | Admitting: Anesthesiology

## 2015-05-31 ENCOUNTER — Encounter: Payer: Self-pay | Admitting: *Deleted

## 2015-05-31 ENCOUNTER — Encounter
Admission: RE | Disposition: A | Discharge status: Home / Self Care | Payer: Self-pay | Source: Ambulatory Visit | Attending: General Surgery

## 2015-05-31 ENCOUNTER — Ambulatory Visit
Admission: RE | Admit: 2015-05-31 | Discharge: 2015-05-31 | Disposition: A | Discharge status: Home / Self Care | Payer: BC Managed Care – PPO | Source: Ambulatory Visit | Attending: General Surgery | Admitting: General Surgery

## 2015-05-31 DIAGNOSIS — Z79899 Other long term (current) drug therapy: Secondary | ICD-10-CM | POA: Diagnosis not present

## 2015-05-31 DIAGNOSIS — I129 Hypertensive chronic kidney disease with stage 1 through stage 4 chronic kidney disease, or unspecified chronic kidney disease: Secondary | ICD-10-CM | POA: Diagnosis not present

## 2015-05-31 DIAGNOSIS — K4091 Unilateral inguinal hernia, without obstruction or gangrene, recurrent: Secondary | ICD-10-CM | POA: Diagnosis not present

## 2015-05-31 DIAGNOSIS — D176 Benign lipomatous neoplasm of spermatic cord: Secondary | ICD-10-CM | POA: Insufficient documentation

## 2015-05-31 DIAGNOSIS — Z7901 Long term (current) use of anticoagulants: Secondary | ICD-10-CM | POA: Insufficient documentation

## 2015-05-31 DIAGNOSIS — Z9049 Acquired absence of other specified parts of digestive tract: Secondary | ICD-10-CM | POA: Insufficient documentation

## 2015-05-31 DIAGNOSIS — I4891 Unspecified atrial fibrillation: Secondary | ICD-10-CM | POA: Diagnosis not present

## 2015-05-31 DIAGNOSIS — Z9889 Other specified postprocedural states: Secondary | ICD-10-CM | POA: Insufficient documentation

## 2015-05-31 DIAGNOSIS — N189 Chronic kidney disease, unspecified: Secondary | ICD-10-CM | POA: Insufficient documentation

## 2015-05-31 DIAGNOSIS — Z885 Allergy status to narcotic agent status: Secondary | ICD-10-CM | POA: Diagnosis not present

## 2015-05-31 DIAGNOSIS — E785 Hyperlipidemia, unspecified: Secondary | ICD-10-CM | POA: Insufficient documentation

## 2015-05-31 DIAGNOSIS — Z87442 Personal history of urinary calculi: Secondary | ICD-10-CM | POA: Diagnosis not present

## 2015-05-31 DIAGNOSIS — Z791 Long term (current) use of non-steroidal anti-inflammatories (NSAID): Secondary | ICD-10-CM | POA: Insufficient documentation

## 2015-05-31 DIAGNOSIS — Z7951 Long term (current) use of inhaled steroids: Secondary | ICD-10-CM | POA: Diagnosis not present

## 2015-05-31 HISTORY — PX: INGUINAL HERNIA REPAIR: SHX194

## 2015-05-31 HISTORY — DX: Chronic kidney disease, unspecified: N18.9

## 2015-05-31 HISTORY — PX: HERNIA REPAIR: SHX51

## 2015-05-31 SURGERY — REPAIR, HERNIA, INGUINAL, ADULT
Anesthesia: General | Laterality: Left

## 2015-05-31 MED ORDER — ONDANSETRON HCL 4 MG PO TABS: 4.0000 mg | ORAL_TABLET | Freq: Four times a day (QID) | ORAL | Status: DC | PRN

## 2015-05-31 MED ORDER — PROPOFOL 10 MG/ML IV BOLUS
INTRAVENOUS | Status: DC | PRN
  Administered 2015-05-31: 170 mg via INTRAVENOUS
  Administered 2015-05-31: 30 mg via INTRAVENOUS

## 2015-05-31 MED ORDER — MIDAZOLAM HCL 2 MG/2ML IJ SOLN
INTRAMUSCULAR | Status: DC | PRN
  Administered 2015-05-31: 2 mg via INTRAVENOUS

## 2015-05-31 MED ORDER — ONDANSETRON HCL 4 MG/2ML IJ SOLN
INTRAMUSCULAR | Status: DC | PRN
  Administered 2015-05-31: 4 mg via INTRAVENOUS

## 2015-05-31 MED ORDER — LACTATED RINGERS IV SOLN
INTRAVENOUS | Status: DC
  Administered 2015-05-31 (×2): via INTRAVENOUS

## 2015-05-31 MED ORDER — TRAMADOL HCL 50 MG PO TABS
ORAL_TABLET | ORAL | Status: AC
  Filled 2015-05-31: qty 2

## 2015-05-31 MED ORDER — LIDOCAINE HCL (CARDIAC) 20 MG/ML IV SOLN
INTRAVENOUS | Status: DC | PRN
  Administered 2015-05-31: 100 mg via INTRAVENOUS

## 2015-05-31 MED ORDER — EPHEDRINE SULFATE 50 MG/ML IJ SOLN
INTRAMUSCULAR | Status: DC | PRN
  Administered 2015-05-31 (×2): 10 mg via INTRAVENOUS

## 2015-05-31 MED ORDER — FAMOTIDINE 20 MG PO TABS
ORAL_TABLET | ORAL | Status: AC
Start: 2015-05-31 — End: 2015-05-31
  Administered 2015-05-31: 20 mg via ORAL
  Filled 2015-05-31: qty 1

## 2015-05-31 MED ORDER — DEXAMETHASONE SODIUM PHOSPHATE 10 MG/ML IJ SOLN
INTRAMUSCULAR | Status: DC | PRN
  Administered 2015-05-31: 10 mg via INTRAVENOUS

## 2015-05-31 MED ORDER — FENTANYL CITRATE (PF) 100 MCG/2ML IJ SOLN: 25.0000 ug | INTRAMUSCULAR | Status: DC | PRN

## 2015-05-31 MED ORDER — BUPIVACAINE-EPINEPHRINE (PF) 0.5% -1:200000 IJ SOLN
INTRAMUSCULAR | Status: AC
  Filled 2015-05-31: qty 30

## 2015-05-31 MED ORDER — BUPIVACAINE-EPINEPHRINE (PF) 0.5% -1:200000 IJ SOLN
INTRAMUSCULAR | Status: DC | PRN
  Administered 2015-05-31: 30 mL via PERINEURAL

## 2015-05-31 MED ORDER — CEFAZOLIN SODIUM-DEXTROSE 2-3 GM-% IV SOLR
2.0000 g | INTRAVENOUS | Status: AC
  Administered 2015-05-31: 2 g via INTRAVENOUS

## 2015-05-31 MED ORDER — FENTANYL CITRATE (PF) 100 MCG/2ML IJ SOLN
INTRAMUSCULAR | Status: DC | PRN
  Administered 2015-05-31 (×2): 50 ug via INTRAVENOUS

## 2015-05-31 MED ORDER — KETOROLAC TROMETHAMINE 30 MG/ML IJ SOLN
INTRAMUSCULAR | Status: DC | PRN
Start: 2015-05-31 — End: 2015-05-31
  Administered 2015-05-31: 30 mg via INTRAVENOUS

## 2015-05-31 MED ORDER — FAMOTIDINE 20 MG PO TABS
20.0000 mg | ORAL_TABLET | Freq: Once | ORAL | Status: AC
  Administered 2015-05-31: 20 mg via ORAL

## 2015-05-31 MED ORDER — TRAMADOL HCL 50 MG PO TABS
100.0000 mg | ORAL_TABLET | Freq: Four times a day (QID) | ORAL | Status: DC | PRN
  Administered 2015-05-31: 100 mg via ORAL

## 2015-05-31 MED ORDER — CEFAZOLIN SODIUM-DEXTROSE 2-3 GM-% IV SOLR
INTRAVENOUS | Status: AC
  Administered 2015-05-31: 2 g via INTRAVENOUS
  Filled 2015-05-31: qty 50

## 2015-05-31 MED ORDER — TRAMADOL HCL 50 MG PO TABS: 100.0000 mg | ORAL_TABLET | Freq: Four times a day (QID) | ORAL | Status: DC | PRN

## 2015-05-31 SURGICAL SUPPLY — 35 items
BLADE SURG 15 STRL SS SAFETY (BLADE) ×4 IMPLANT
CANISTER SUCT 1200ML W/VALVE (MISCELLANEOUS) ×2 IMPLANT
CHLORAPREP W/TINT 26ML (MISCELLANEOUS) ×2 IMPLANT
DECANTER SPIKE VIAL GLASS SM (MISCELLANEOUS) ×2 IMPLANT
DRAIN PENROSE 1/4X12 LTX (DRAIN) ×2 IMPLANT
DRAPE LAPAROTOMY 100X77 ABD (DRAPES) ×2 IMPLANT
DRESSING TELFA 4X3 1S ST N-ADH (GAUZE/BANDAGES/DRESSINGS) ×2 IMPLANT
DRSG TEGADERM 4X4.75 (GAUZE/BANDAGES/DRESSINGS) ×2 IMPLANT
ELECT REM PT RETURN 9FT ADLT (ELECTROSURGICAL) ×2
ELECTRODE REM PT RTRN 9FT ADLT (ELECTROSURGICAL) ×1 IMPLANT
GLOVE BIO SURGEON STRL SZ 6.5 (GLOVE) ×2 IMPLANT
GLOVE BIO SURGEON STRL SZ7.5 (GLOVE) ×2 IMPLANT
GLOVE BIOGEL PI IND STRL 6.5 (GLOVE) ×1 IMPLANT
GLOVE BIOGEL PI INDICATOR 6.5 (GLOVE) ×1
GLOVE INDICATOR 8.0 STRL GRN (GLOVE) ×2 IMPLANT
GOWN STRL REUS W/ TWL LRG LVL3 (GOWN DISPOSABLE) ×2 IMPLANT
GOWN STRL REUS W/TWL LRG LVL3 (GOWN DISPOSABLE) ×2
KIT RM TURNOVER STRD PROC AR (KITS) ×2 IMPLANT
LABEL OR SOLS (LABEL) ×2 IMPLANT
MESH PROLOOP (Mesh General) ×2 IMPLANT
NDL SAFETY 22GX1.5 (NEEDLE) ×4 IMPLANT
NEEDLE HYPO 25X1 1.5 SAFETY (NEEDLE) ×2 IMPLANT
PACK BASIN MINOR ARMC (MISCELLANEOUS) ×2 IMPLANT
STRIP CLOSURE SKIN 1/2X4 (GAUZE/BANDAGES/DRESSINGS) ×2 IMPLANT
SUT SURGILON 0 BLK (SUTURE) ×4 IMPLANT
SUT VIC AB 2-0 SH 27 (SUTURE) ×1
SUT VIC AB 2-0 SH 27XBRD (SUTURE) ×1 IMPLANT
SUT VIC AB 3-0 54X BRD REEL (SUTURE) ×1 IMPLANT
SUT VIC AB 3-0 BRD 54 (SUTURE) ×1
SUT VIC AB 3-0 SH 27 (SUTURE) ×1
SUT VIC AB 3-0 SH 27X BRD (SUTURE) ×1 IMPLANT
SUT VIC AB 4-0 FS2 27 (SUTURE) ×2 IMPLANT
SWABSTK COMLB BENZOIN TINCTURE (MISCELLANEOUS) ×2 IMPLANT
SYR 3ML LL SCALE MARK (SYRINGE) ×2 IMPLANT
SYR CONTROL 10ML (SYRINGE) ×4 IMPLANT

## 2015-05-31 NOTE — Transfer of Care (Signed)
Immediate Anesthesia Transfer of Care Note  Patient: Jorge [name]  Procedure(s) Performed: Procedure(s): HERNIA REPAIR INGUINAL ADULT (Left)  Patient Location: PACU  Anesthesia Type:General  Level of Consciousness: awake, alert , oriented and patient cooperative  Airway & Oxygen Therapy: Patient Spontanous Breathing and Patient connected to face mask oxygen  Post-op Assessment: Report given to RN, Post -op Vital signs reviewed and stable and Patient moving all extremities X 4  Post vital signs: Reviewed and stable  Last Vitals:  Filed Vitals:   05/31/15 1059  BP: 134/90  Pulse: 96  Temp: 36.7 C  Resp: 16    Complications: No apparent anesthesia complications

## 2015-05-31 NOTE — Discharge Instructions (Signed)

## 2015-05-31 NOTE — H&P (Signed)
GRACE AMBROGIO [id number] 10-13-1965     HPI: 50 y./o male with recurrent left inguinal hernia s/p robotic repair 2 years ago. For elective repair.   Prescriptions prior to admission  Medication Sig Dispense Refill Last Dose  . acetaminophen (TYLENOL) 500 MG tablet Take 1,000 mg by mouth every 6 (six) hours as needed for headache (pain).    Taking  . atorvastatin (LIPITOR) 10 MG tablet TAKE 1 TABLET BY MOUTH EVERY DAY (Patient taking differently: TAKE 1 TABLET BY MOUTH EVERY DAY-PM) 30 tablet 5 Taking  . cetirizine (ZYRTEC) 10 MG tablet Take 1 tablet (10 mg total) by mouth daily as needed for allergies. 30 tablet 12 Taking  . diltiazem (CARDIZEM CD) 180 MG 24 hr capsule Take 180 mg by mouth every evening.   Taking  . fluticasone (FLONASE) 50 MCG/ACT nasal spray Place 1 spray into both nostrils daily as needed for allergies.    Taking  . ibuprofen (ADVIL,MOTRIN) 200 MG tablet Take 800 mg by mouth every 8 (eight) hours as needed (headache and knee pain).    Taking  . rivaroxaban (XARELTO) 20 MG TABS tablet TAKE 1 TABLET (20 MG TOTAL) BY MOUTH DAILY WITH SUPPER. 30 tablet 11 Taking  . TIKOSYN 500 MCG capsule TAKE ONE CAPSULE BY MOUTH TWICE A DAY 60 capsule 3 Taking   Allergies  Allergen Reactions  . Oxycodone Nausea And Vomiting  . Vicodin [Hydrocodone-Acetaminophen] Nausea And Vomiting    Can take regular Tylenol   Past Medical History  Diagnosis Date  . Atrial fibrillation ([place])     first diagnosed 2011; s/p DCCV at that time; ablation 10-2013 & 03/2014; failed multaq, flecainide and amiodarone  . Hyperlipidemia   . Hypertension   . Sinusitis   . Headache   . Chronic anticoagulation     Xarelto  . Dysrhythmia   . Chronic kidney disease     H/O KIDNEY STONES   Past Surgical History  Procedure Laterality Date  . Cardioversion      Hx of  . Appendectomy  1975  . Rhinoplasty  2006    Dr. Nadeen Landau, [facility]  . Knee surgery  2009    [facility]  .  Cardioversion N/A 10/15/2013    Procedure: CARDIOVERSION;  Surgeon: Fay Records, MD;  Location: [place];  Service: Cardiovascular;  Laterality: N/A;  . Tee without cardioversion N/A 11/01/2013    Procedure: TRANSESOPHAGEAL ECHOCARDIOGRAM (TEE);  Surgeon: Candee Furbish, MD;  Location: [facility] ENDOSCOPY;  Service: Cardiovascular;  Laterality: N/A;  . Tee without cardioversion N/A 03/28/2014    Procedure: TRANSESOPHAGEAL ECHOCARDIOGRAM (TEE);  Surgeon: Larey Dresser, MD;  Location: [facility] ENDOSCOPY;  Service: Cardiovascular;  Laterality: N/A;  . Atrial fibrillation ablation N/A 11/02/2013    PVI by Dr Rayann Heman  . Atrial fibrillation ablation N/A 03/29/2014    PVI and CTI by Dr Rayann Heman  . Colonoscopy  2016  . Hernia repair Left 05/28/2013    left inguinal henria repair    Social History   Social History  . Marital Status: Married    Spouse Name: N/A  . Number of Children: N/A  . Years of Education: N/A   Occupational History  . [employer] Server w/ [employer]    Social History Main Topics  . Smoking status: Never Smoker   . Smokeless tobacco: Never Used  . Alcohol Use: 1.2 oz/week    2 Cans of beer per week     Comment: occasionally  . Drug Use:  No  . Sexual Activity: Yes   Other Topics Concern  . Not on file   Social History Narrative   Lives in [place] with his spouse.  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[employer]   Social History   Social History Narrative   Lives in [place] with his spouse.  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[employer]     ROS: Negative.     PE: HEENT: Negative. Lungs: Clear. Cardio: RR. Left groin: reducible hernia. Robert Bellow 05/31/2015   Assessment/Plan:  Proceed with planned hernia repair.

## 2015-05-31 NOTE — Anesthesia Preprocedure Evaluation (Signed)
Anesthesia Evaluation  Patient identified by MRN, date of birth, ID band Patient awake    Reviewed: Allergy & Precautions, H&P , NPO status , Patient's Chart, lab work & pertinent test results  History of Anesthesia Complications Negative for: history of anesthetic complications  Airway Mallampati: II  TM Distance: >3 FB Neck ROM: full    Dental  (+) Poor Dentition   Pulmonary neg pulmonary ROS, neg shortness of breath,    Pulmonary exam normal breath sounds clear to auscultation       Cardiovascular Exercise Tolerance: Good hypertension, (-) angina(-) Past MI and (-) DOE + dysrhythmias Atrial Fibrillation  Rhythm:irregular Rate:Normal     Neuro/Psych  Headaches, negative psych ROS   GI/Hepatic negative GI ROS, Neg liver ROS,   Endo/Other  negative endocrine ROS  Renal/GU Renal disease  negative genitourinary   Musculoskeletal   Abdominal   Peds  Hematology negative hematology ROS (+)   Anesthesia Other Findings Past Medical History:   Atrial fibrillation ([place])                                      Comment:first diagnosed 2011; s/p DCCV at that time;               ablation 10-2013 & 03/2014; failed multaq,               flecainide and amiodarone   Hyperlipidemia                                               Hypertension                                                 Sinusitis                                                    Headache                                                     Chronic anticoagulation                                        Comment:Xarelto   Dysrhythmia                                                  Chronic kidney disease                                         Comment:H/O KIDNEY STONES  Past Surgical  History:   CARDIOVERSION                                                   Comment:Hx of   APPENDECTOMY                                     1975         RHINOPLASTY                                       2006           Comment:Dr. Nadeen Landau, [place]                                     2009           [place]                                   N/A 10/15/2013      Comment:Procedure: CARDIOVERSION;  Surgeon: Fay Records, MD;  Location: [place];  Service:               Cardiovascular;  Laterality: N/A;   TEE WITHOUT CARDIOVERSION                       N/A 11/01/2013       Comment:Procedure: TRANSESOPHAGEAL ECHOCARDIOGRAM               (TEE);  Surgeon: Candee Furbish, MD;  Location: [facility]              ENDOSCOPY;  Service: Cardiovascular;                Laterality: N/A;   TEE WITHOUT CARDIOVERSION                       N/A 03/28/2014     Comment:Procedure: TRANSESOPHAGEAL ECHOCARDIOGRAM               (TEE);  Surgeon: Larey Dresser, MD;                Location: [place];  Service:               Cardiovascular;  Laterality: N/A;   ATRIAL FIBRILLATION ABLATION                    N/A 11/02/2013       Comment:PVI by Dr Rayann Heman   ATRIAL FIBRILLATION ABLATION                    N/A 03/29/2014      Comment:PVI and CTI by Dr Rayann Heman   COLONOSCOPY  2016         HERNIA REPAIR                                   Left 05/28/2013      Comment:left inguinal henria repair   BMI    Body Mass Index   25.81 kg/m 2      Reproductive/Obstetrics negative OB ROS                             Anesthesia Physical Anesthesia Plan  ASA: III  Anesthesia Plan: General LMA   Post-op Pain Management:    Induction:   Airway Management Planned:   Additional Equipment:   Intra-op Plan:   Post-operative Plan:   Informed Consent: I have reviewed the patients History and Physical, chart, labs and discussed the procedure including the risks, benefits and alternatives for the proposed anesthesia with the patient or authorized representative who has indicated  his/her understanding and acceptance.   Dental Advisory Given  Plan Discussed with: Anesthesiologist, CRNA and Surgeon  Anesthesia Plan Comments:         Anesthesia Quick Evaluation

## 2015-05-31 NOTE — Anesthesia Procedure Notes (Signed)
Procedure Name: LMA Insertion Date/Time: 05/31/2015 11:56 AM Performed by: Silvana Newness Pre-anesthesia Checklist: Patient identified, Emergency Drugs available, Suction available, Patient being monitored and Timeout performed Patient Re-evaluated:Patient Re-evaluated prior to inductionOxygen Delivery Method: Circle system utilized Preoxygenation: Pre-oxygenation with 100% oxygen Intubation Type: IV induction Ventilation: Mask ventilation without difficulty LMA: LMA inserted LMA Size: 4.5 Number of attempts: 1 Placement Confirmation: positive ETCO2 and breath sounds checked- equal and bilateral Tube secured with: Tape Dental Injury: Teeth and Oropharynx as per pre-operative assessment

## 2015-05-31 NOTE — Op Note (Signed)
Preoperative diagnosis: Recurrent left inguinal hernia.  Postoperative diagnosis: Same.  Operative procedure: Left inguinal hernia repair with Atrium Proloop mesh.  Operating surgeon: Ollen Bowl, M.D.  Anesthesia: Gen. by LMA, Marcaine 0.5% with 1-200,000 epinephrine, 30 mL local infiltration; Toradol: 30 mg.  Estimated blood loss: Minimal.  Medical no: This 50 year old male underwent robotic left inguinal hernia 2 years ago. She's developed recurrence and is admitted today for elective repair. He received Kefzol prior to procedure. Hair was removed from the surgical site with clippers.  Operative note: With the patient had adequate general anesthesia the area was prepped with ChloraPrep and draped. A 5 cm skin line incision along into the course inguinal canal was carried down through skin subtenon's tissue with hemostasis achieved by electrocautery and 3-0 Vicryl ties. The external oblique was opened in the direction of its fibers. The cord was mobilized. Lipoma of the cord excised and discarded. Dissection showed no injury of an indirect sac. There was a sizable direct defect. This was opened to allow exposure of the preperitoneal space. A large A TRAM Plug was then placed and the iliopubic tract closed with interrupted 0 Surgilon figure-of-eight sutures. An onlay mesh of Atrium polypropylene was then placed and anchored to the pubic tubercle with 0 Surgilon. The inferior border was anchored to the inguinal ligament with interrupted 0 Surgilon sutures. The medial and superior borders were anchored to the transversus abdominis aponeurosis. Of note, the ilioinguinal and iliohypogastric nerves were identified and protected.  Toradol was placed in the wound. Field block anesthesia was established prior to incision with the above-mentioned Marcaine. The external oblique fascia was approximated with a running 2-0 Vicryl suture. Scarpa's fascia was closed with a running 3-0 Vicryl suture. The skin  closed with a running 4-0 Vicryl suture. Benzoin, Steri-Strip, Telfa and Tegaderm dressing was applied.  The patient tolerated the procedure well and was taken to recovery in stable condition.

## 2015-05-31 NOTE — Anesthesia Postprocedure Evaluation (Signed)
Anesthesia Post Note  Patient: Jorge [name]  Procedure(s) Performed: Procedure(s) (LRB): HERNIA REPAIR INGUINAL ADULT (Left)  Patient location during evaluation: PACU Anesthesia Type: General Level of consciousness: awake and alert Pain management: pain level controlled Vital Signs Assessment: post-procedure vital signs reviewed and stable Respiratory status: spontaneous breathing, nonlabored ventilation, respiratory function stable and patient connected to nasal cannula oxygen Cardiovascular status: blood pressure returned to baseline and stable Postop Assessment: no signs of nausea or vomiting Anesthetic complications: no    Last Vitals:  Filed Vitals:   05/31/15 1409 05/31/15 1451  BP: 127/82 138/92  Pulse: 78 87  Temp: 36.1 C   Resp: 20 20    Last Pain:  Filed Vitals:   05/31/15 1452  PainSc: 3                  Precious Haws Piscitello

## 2015-06-01 ENCOUNTER — Encounter: Payer: Self-pay | Admitting: General Surgery

## 2015-06-02 ENCOUNTER — Other Ambulatory Visit: Payer: Self-pay | Admitting: Internal Medicine

## 2015-06-07 ENCOUNTER — Ambulatory Visit (INDEPENDENT_AMBULATORY_CARE_PROVIDER_SITE_OTHER): Payer: BC Managed Care – PPO | Admitting: General Surgery

## 2015-06-07 ENCOUNTER — Encounter: Payer: Self-pay | Admitting: General Surgery

## 2015-06-07 VITALS — BP 114/64 | HR 82 | Resp 12 | Ht 71.0 in | Wt 185.0 lb

## 2015-06-07 DIAGNOSIS — K4091 Unilateral inguinal hernia, without obstruction or gangrene, recurrent: Secondary | ICD-10-CM

## 2015-06-07 NOTE — Progress Notes (Signed)
Patient ID: Jorge [name], male   DOB: Dec 14, 1965, 50 y.o.   MRN: [id number]  Chief Complaint  Patient presents with  . Routine Post Op    left inguinal hernia    HPI Jorge [name] is a 50 y.o. male here today for his post op left inguinal hernia repair done on 05/31/15. Patient states he is doing well, some swelling still present but significantly improved.  HPI  Past Medical History  Diagnosis Date  . Atrial fibrillation ([place])     first diagnosed 2011; s/p DCCV at that time; ablation 10-2013 & 03/2014; failed multaq, flecainide and amiodarone  . Hyperlipidemia   . Hypertension   . Sinusitis   . Headache   . Chronic anticoagulation     Xarelto  . Dysrhythmia   . Chronic kidney disease     H/O KIDNEY STONES    Past Surgical History  Procedure Laterality Date  . Cardioversion      Hx of  . Appendectomy  1975  . Rhinoplasty  2006    Dr. Nadeen Landau, [facility]  . Knee surgery  2009    [facility]  . Cardioversion N/A 10/15/2013    Procedure: CARDIOVERSION;  Surgeon: Fay Records, MD;  Location: [facility];  Service: Cardiovascular;  Laterality: N/A;  . Tee without cardioversion N/A 11/01/2013    Procedure: TRANSESOPHAGEAL ECHOCARDIOGRAM (TEE);  Surgeon: Candee Furbish, MD;  Location: [facility] ENDOSCOPY;  Service: Cardiovascular;  Laterality: N/A;  . Tee without cardioversion N/A 03/28/2014    Procedure: TRANSESOPHAGEAL ECHOCARDIOGRAM (TEE);  Surgeon: Larey Dresser, MD;  Location: [facility] ENDOSCOPY;  Service: Cardiovascular;  Laterality: N/A;  . Atrial fibrillation ablation N/A 11/02/2013    PVI by Dr Rayann Heman  . Atrial fibrillation ablation N/A 03/29/2014    PVI and CTI by Dr Rayann Heman  . Colonoscopy  2016  . Inguinal hernia repair Left 05/31/2015    Procedure: HERNIA REPAIR INGUINAL ADULT;  Surgeon: Robert Bellow, MD;  Location: [facility] ORS;  Service: General;  Laterality: Left;  . Hernia repair Left 05/28/2013    left inguinal henria repair, robotic direct defect  repaired with Bard ultralight mesh.  . Hernia repair Left 05/31/2015    Large Atrium pro loop mesh, direct defect.     Family History  Problem Relation Age of Onset  . Colon polyps      Fam Hx  . Cancer Mother     breast, cause of death  . Heart attack Maternal Grandfather 53    Cause of death  . Hypertension Maternal Grandfather   . Heart failure Brother   . CVA Paternal Grandmother     cause of death  . Atrial fibrillation Maternal Grandmother     Social History Social History  Substance Use Topics  . Smoking status: Never Smoker   . Smokeless tobacco: Never Used  . Alcohol Use: 1.2 oz/week    2 Cans of beer per week     Comment: occasionally    Allergies  Allergen Reactions  . Oxycodone Nausea And Vomiting  . Vicodin [Hydrocodone-Acetaminophen] Nausea And Vomiting    Can take regular Tylenol    Current Outpatient Prescriptions  Medication Sig Dispense Refill  . aspirin 81 MG chewable tablet Chew 81 mg by mouth daily.    Marland Kitchen atorvastatin (LIPITOR) 10 MG tablet TAKE 1 TABLET BY MOUTH EVERY DAY (Patient taking differently: TAKE 1 TABLET BY MOUTH EVERY DAY-PM) 30 tablet 5  . cetirizine (ZYRTEC) 10 MG tablet  Take 1 tablet (10 mg total) by mouth daily as needed for allergies. 30 tablet 12  . diltiazem (CARDIZEM CD) 180 MG 24 hr capsule Take 180 mg by mouth every evening.    . fluticasone (FLONASE) 50 MCG/ACT nasal spray Place 1 spray into both nostrils daily as needed for allergies.     Marland Kitchen ondansetron (ZOFRAN) 4 MG tablet Take 1 tablet (4 mg total) by mouth every 6 (six) hours as needed for nausea or vomiting. 20 tablet 0  . TIKOSYN 500 MCG capsule TAKE ONE CAPSULE BY MOUTH TWICE A DAY 60 capsule 1   No current facility-administered medications for this visit.    Review of Systems Review of Systems  Constitutional: Negative.   Respiratory: Negative.   Cardiovascular: Negative.     Blood pressure 114/64, pulse 82, resp. rate 12, height 5\' 11"  (1.803 m), weight 185 lb  (83.915 kg).  Physical Exam Physical Exam  Constitutional: He is oriented to person, place, and time. He appears well-developed and well-nourished.  Abdominal:  Left inguinal incision is clean and healing well.  Neurological: He is alert and oriented to person, place, and time.  Skin: Skin is warm and dry.      Assessment    Doing well status post recurrent left direct inguinal hernia repair.    Plan    Proper lifting technique reviewed. He may increase his activity as he is comfortable.   Patient to return as needed.  PCP:  Rosanna Randy  This information has been scribed by Karie Fetch RNBC.    Robert Bellow 06/07/2015, 5:39 PM

## 2015-06-07 NOTE — Patient Instructions (Addendum)
Patient to return as needed. Proper lifting techniques reviewed. 

## 2015-06-23 ENCOUNTER — Other Ambulatory Visit: Payer: Self-pay | Admitting: Internal Medicine

## 2015-07-07 ENCOUNTER — Ambulatory Visit: Payer: BC Managed Care – PPO | Admitting: Internal Medicine

## 2015-07-10 ENCOUNTER — Encounter: Payer: Self-pay | Admitting: Internal Medicine

## 2015-07-10 ENCOUNTER — Ambulatory Visit (INDEPENDENT_AMBULATORY_CARE_PROVIDER_SITE_OTHER): Payer: BC Managed Care – PPO | Admitting: Internal Medicine

## 2015-07-10 VITALS — BP 138/92 | HR 79 | Ht 71.0 in | Wt 195.2 lb

## 2015-07-10 DIAGNOSIS — I10 Essential (primary) hypertension: Secondary | ICD-10-CM | POA: Diagnosis not present

## 2015-07-10 DIAGNOSIS — I480 Paroxysmal atrial fibrillation: Secondary | ICD-10-CM | POA: Diagnosis not present

## 2015-07-10 LAB — BASIC METABOLIC PANEL
BUN: 12 mg/dL (ref 7–25)
CO2: 26 mmol/L (ref 20–31)
CREATININE: 0.71 mg/dL (ref 0.60–1.35)
Calcium: 9.3 mg/dL (ref 8.6–10.3)
Chloride: 103 mmol/L (ref 98–110)
Glucose, Bld: 95 mg/dL (ref 65–99)
Potassium: 4.2 mmol/L (ref 3.5–5.3)
Sodium: 139 mmol/L (ref 135–146)

## 2015-07-10 LAB — MAGNESIUM: MAGNESIUM: 1.8 mg/dL (ref 1.5–2.5)

## 2015-07-10 MED ORDER — DILTIAZEM HCL 30 MG PO TABS: ORAL_TABLET | ORAL | Status: DC

## 2015-07-10 MED ORDER — TIKOSYN 500 MCG PO CAPS: 500.0000 ug | ORAL_CAPSULE | Freq: Two times a day (BID) | ORAL | Status: DC

## 2015-07-10 NOTE — Progress Notes (Signed)
PCP: Wilhemena Durie, MD  Jorge [name] is a 50 y.o. male who presents today for routine electrophysiology followup.  Doing very well at this time.  Has retired from [employer].  Only 1 short episode of AF since January.  Today, he denies symptoms of chest pain, shortness of breath,  lower extremity edema, dizziness, presyncope, or syncope.  The patient is otherwise without complaint today.   Past Medical History  Diagnosis Date  . Atrial fibrillation ([place])     first diagnosed 2011; s/p DCCV at that time; ablation 10-2013 & 03/2014; failed multaq, flecainide and amiodarone  . Hyperlipidemia   . Hypertension   . Sinusitis   . Headache   . Chronic anticoagulation     Xarelto  . Dysrhythmia   . Chronic kidney disease     H/O KIDNEY STONES   Past Surgical History  Procedure Laterality Date  . Cardioversion      Hx of  . Appendectomy  1975  . Rhinoplasty  2006    [facility]. Nadeen Landau, [facility]  . Knee surgery  2009    [facility]  . Cardioversion N/A 10/15/2013    Procedure: CARDIOVERSION;  Surgeon: Fay Records, MD;  Location: [place];  Service: Cardiovascular;  Laterality: N/A;  . Tee without cardioversion N/A 11/01/2013    Procedure: TRANSESOPHAGEAL ECHOCARDIOGRAM (TEE);  Surgeon: Candee Furbish, MD;  Location: [facility] ENDOSCOPY;  Service: Cardiovascular;  Laterality: N/A;  . Tee without cardioversion N/A 03/28/2014    Procedure: TRANSESOPHAGEAL ECHOCARDIOGRAM (TEE);  Surgeon: Larey Dresser, MD;  Location: [facility] ENDOSCOPY;  Service: Cardiovascular;  Laterality: N/A;  . Atrial fibrillation ablation N/A 11/02/2013    PVI by Dr Rayann Heman  . Atrial fibrillation ablation N/A 03/29/2014    PVI and CTI by Dr Rayann Heman  . Colonoscopy  2016  . Inguinal hernia repair Left 05/31/2015    Procedure: HERNIA REPAIR INGUINAL ADULT;  Surgeon: Robert Bellow, MD;  Location: [facility] ORS;  Service: General;  Laterality: Left;  . Hernia repair Left 05/28/2013    left inguinal henria repair,  robotic direct defect repaired with Bard ultralight mesh.  . Hernia repair Left 05/31/2015    Large Atrium pro loop mesh, direct defect.     ROS- all systems are reviewed and negatives except as per HPI above  Current Outpatient Prescriptions  Medication Sig Dispense Refill  . atorvastatin (LIPITOR) 10 MG tablet TAKE 1 TABLET BY MOUTH EVERY DAY (Patient taking differently: TAKE 1 TABLET BY MOUTH EVERY DAY-PM) 30 tablet 5  . cetirizine (ZYRTEC) 10 MG tablet Take 1 tablet (10 mg total) by mouth daily as needed for allergies. 30 tablet 12  . diltiazem (CARDIZEM CD) 180 MG 24 hr capsule TAKE 1 CAPSULE (180 MG TOTAL) BY MOUTH DAILY. 90 capsule 2  . fluticasone (FLONASE) 50 MCG/ACT nasal spray Place 1 spray into both nostrils daily as needed for allergies.     Marland Kitchen TIKOSYN 500 MCG capsule TAKE ONE CAPSULE BY MOUTH TWICE A DAY 60 capsule 1  . XARELTO 20 MG TABS tablet Take 1 tablet by mouth daily.  11   No current facility-administered medications for this visit.    Physical Exam: Filed Vitals:   07/10/15 0935  BP: 138/92  Pulse: 79  Height: 5\' 11"  (1.803 m)  Weight: 195 lb 3.2 oz (88.542 kg)    GEN- The patient is well appearing, alert and oriented x 3 today.   Head- normocephalic, atraumatic Eyes-  Sclera clear,  conjunctiva pink Ears- hearing intact Oropharynx- clear Lungs- Clear to ausculation bilaterally, normal work of breathing Heart- irregular rate and rhythm, no murmurs, rubs or gallops, PMI not laterally displaced GI- soft, NT, ND, + BS Extremities- no clubbing, cyanosis, or edema  ekg reveals sinus 79 bpm, QTc 456 msec  Assessment and Plan:  1. afib I would like to better quantify his AF.  I am concerned that he has more af than he is aware.  I think that implantation of an implantable loop recorder for AF management post ablation would be best.  This will allow for more accurate arrhythmia burden detection as well as better rate control and may also help [place] reduce the time  that he is on anticoagulation.  chads2vasc score is 1. He is compliant with anticoagulation.  Could consider stopping anticoagulation if AF remains stable. Bmet, mg today  2. HTN Stable No change required today  3. Chest pressure during afib Likely due to RVR ETT low risk Will use pill in pocket cardizem for RVR  Return to see me in 67months  Thompson Grayer MD, [facility] 07/10/2015 9:54 AM

## 2015-07-10 NOTE — Patient Instructions (Signed)
Medication Instructions:  Your physician has recommended you make the following change in your medication:  1) Take Cardizem 30mg  ---1-2 tablets by mouth every 6 hours as needed   Labwork: Your physician recommends that you return for lab work today: BMP/Mag   Testing/Procedures: None ordered   Follow-Up: Your physician wants you to follow-up in: 6 months with Dr Rayann Heman Dennis Bast will receive a reminder letter in the mail two months in advance. If you don't receive a letter, please call our office to schedule the follow-up appointment.   Any Other Special Instructions Will Be Listed Below (If Applicable).     If you need a refill on your cardiac medications before your next appointment, please call your pharmacy.

## 2015-07-20 NOTE — Addendum Note (Signed)
Addended by: Freada Bergeron on: 07/20/2015 04:55 PM   Modules accepted: Orders

## 2015-10-09 ENCOUNTER — Other Ambulatory Visit: Payer: Self-pay | Admitting: Internal Medicine

## 2015-11-16 ENCOUNTER — Other Ambulatory Visit: Payer: Self-pay | Admitting: Internal Medicine

## 2016-01-24 ENCOUNTER — Ambulatory Visit (INDEPENDENT_AMBULATORY_CARE_PROVIDER_SITE_OTHER): Payer: BC Managed Care – PPO | Admitting: Internal Medicine

## 2016-01-24 ENCOUNTER — Encounter: Payer: Self-pay | Admitting: Internal Medicine

## 2016-01-24 VITALS — BP 140/82 | HR 77 | Ht 71.0 in | Wt 195.8 lb

## 2016-01-24 DIAGNOSIS — I480 Paroxysmal atrial fibrillation: Secondary | ICD-10-CM | POA: Diagnosis not present

## 2016-01-24 LAB — BASIC METABOLIC PANEL
BUN: 17 mg/dL (ref 7–25)
CALCIUM: 9.1 mg/dL (ref 8.6–10.3)
CO2: 24 mmol/L (ref 20–31)
Chloride: 103 mmol/L (ref 98–110)
Creat: 0.93 mg/dL (ref 0.70–1.33)
Glucose, Bld: 93 mg/dL (ref 65–99)
POTASSIUM: 3.8 mmol/L (ref 3.5–5.3)
SODIUM: 139 mmol/L (ref 135–146)

## 2016-01-24 LAB — MAGNESIUM: MAGNESIUM: 1.9 mg/dL (ref 1.5–2.5)

## 2016-01-24 NOTE — Patient Instructions (Addendum)
Medication Instructions:   Your physician recommends that you continue on your current medications as directed. Please refer to the Current Medication list given to you today.   Labwork:  Your physician recommends that you return for lab work today: BMP/Mag    Testing/Procedures:  None ordered   Follow-Up: Your physician wants you to follow-up in: 3 months with Dr Rayann Heman Dennis Bast will receive a reminder letter in the mail two months in advance. If you don't receive a letter, please call our office to schedule the follow-up appointment.   Any Other Special Instructions Will Be Listed Below (If Applicable).     If you need a refill on your cardiac medications before your next appointment, please call your pharmacy.

## 2016-01-29 NOTE — Progress Notes (Signed)
PCP: Wilhemena Durie, MD  Jorge [name] is a 50 y.o. male who presents today for routine electrophysiology followup.  Doing very well at this time.  He thinks that he is primarily in sinus rhythm.  Today, he denies symptoms of chest pain, shortness of breath,  lower extremity edema, dizziness, presyncope, or syncope.  The patient is otherwise without complaint today.   Past Medical History:  Diagnosis Date  . Atrial fibrillation ([place])    first diagnosed 2011; s/p DCCV at that time; ablation 10-2013 & 03/2014; failed multaq, flecainide and amiodarone  . Chronic anticoagulation    Xarelto  . Chronic kidney disease    H/O KIDNEY STONES  . Dysrhythmia   . Headache   . Hyperlipidemia   . Hypertension   . Sinusitis    Past Surgical History:  Procedure Laterality Date  . APPENDECTOMY  1975  . ATRIAL FIBRILLATION ABLATION N/A 11/02/2013   PVI by Dr Rayann Heman  . ATRIAL FIBRILLATION ABLATION N/A 03/29/2014   PVI and CTI by Dr Rayann Heman  . CARDIOVERSION     Hx of  . CARDIOVERSION N/A 10/15/2013   Procedure: CARDIOVERSION;  Surgeon: Fay Records, MD;  Location: [place];  Service: Cardiovascular;  Laterality: N/A;  . COLONOSCOPY  2016  . HERNIA REPAIR Left 05/28/2013   left inguinal henria repair, robotic direct defect repaired with Bard ultralight mesh.  Marland Kitchen HERNIA REPAIR Left 05/31/2015   Large Atrium pro loop mesh, direct defect.   . INGUINAL HERNIA REPAIR Left 05/31/2015   Procedure: HERNIA REPAIR INGUINAL ADULT;  Surgeon: Robert Bellow, MD;  Location: [facility] ORS;  Service: General;  Laterality: Left;  . KNEE SURGERY  2009   [facility]  . RHINOPLASTY  2006   Dr. Nadeen Landau, [facility]  . TEE WITHOUT CARDIOVERSION N/A 11/01/2013   Procedure: TRANSESOPHAGEAL ECHOCARDIOGRAM (TEE);  Surgeon: Candee Furbish, MD;  Location: [facility] ENDOSCOPY;  Service: Cardiovascular;  Laterality: N/A;  . TEE WITHOUT CARDIOVERSION N/A 03/28/2014   Procedure: TRANSESOPHAGEAL ECHOCARDIOGRAM (TEE);   Surgeon: Larey Dresser, MD;  Location: [place];  Service: Cardiovascular;  Laterality: N/A;    ROS- all systems are reviewed and negatives except as per HPI above  Current Outpatient Prescriptions  Medication Sig Dispense Refill  . atorvastatin (LIPITOR) 10 MG tablet TAKE 1 TABLET BY MOUTH EVERY DAY 30 tablet 5  . cetirizine (ZYRTEC) 10 MG tablet Take 1 tablet (10 mg total) by mouth daily as needed for allergies. 30 tablet 12  . diltiazem (CARDIZEM CD) 180 MG 24 hr capsule TAKE 1 CAPSULE (180 MG TOTAL) BY MOUTH DAILY. 90 capsule 2  . diltiazem (CARDIZEM) 30 MG tablet TAKE 1-2 TABLETS BY MOUTH EVERY 6 HOURS AS NEEDED FOR PALPITAIONS 30 tablet 6  . fluticasone (FLONASE) 50 MCG/ACT nasal spray Place 1 spray into both nostrils daily as needed for allergies.     Marland Kitchen TIKOSYN 500 MCG capsule Take 1 capsule (500 mcg total) by mouth 2 (two) times daily. 180 capsule 3  . XARELTO 20 MG TABS tablet TAKE 1 TABLET BY MOUTH DAILY WITH SUPPER 30 tablet 11   No current facility-administered medications for this visit.     Physical Exam: Vitals:   01/24/16 1601  BP: 140/82  Pulse: 77  Weight: 195 lb 12.8 oz (88.8 kg)  Height: 5\' 11"  (1.803 m)    GEN- The patient is well appearing, alert and oriented x 3 today.   Head- normocephalic, atraumatic Eyes-  Sclera clear, conjunctiva  pink Ears- hearing intact Oropharynx- clear Lungs- Clear to ausculation bilaterally, normal work of breathing Heart- regular rate and rhythm, no murmurs, rubs or gallops, PMI not laterally displaced GI- soft, NT, ND, + BS Extremities- no clubbing, cyanosis, or edema  ekg reveals sinus today with stable Qtc  Assessment and Plan:  1. afib No changes today Bmet, Mg chads2vasc score is 1. He is compliant with anticoagulation.     2. HTN Stable No change required today  3. Chest pressure during afib Likely due to RVR ETT low risk Will use pill in pocket cardizem for RVR  Return to see me in 3 months  Thompson Grayer MD, [facility]

## 2016-02-12 ENCOUNTER — Other Ambulatory Visit: Payer: Self-pay | Admitting: Internal Medicine

## 2016-03-07 ENCOUNTER — Telehealth: Payer: Self-pay | Admitting: Family Medicine

## 2016-03-07 DIAGNOSIS — Z0000 Encounter for general adult medical examination without abnormal findings: Secondary | ICD-10-CM

## 2016-03-07 NOTE — Telephone Encounter (Signed)
Routine labs for age

## 2016-03-07 NOTE — Telephone Encounter (Signed)
Please erview-aa

## 2016-03-07 NOTE — Telephone Encounter (Signed)
Lab slip ready for pick up. Tried to call pt. LM.

## 2016-03-07 NOTE — Telephone Encounter (Signed)
Pt is coming in next week for CPE and would like to go ahead and get labs done prior to the appt. Please advise. Thanks TNP

## 2016-03-12 LAB — CBC WITH DIFFERENTIAL/PLATELET
BASOS ABS: 0 10*3/uL (ref 0.0–0.2)
Basos: 0 %
EOS (ABSOLUTE): 0.2 10*3/uL (ref 0.0–0.4)
Eos: 4 %
HEMATOCRIT: 41 % (ref 37.5–51.0)
Hemoglobin: 14.8 g/dL (ref 12.6–17.7)
Immature Grans (Abs): 0 10*3/uL (ref 0.0–0.1)
Immature Granulocytes: 0 %
LYMPHS ABS: 1.7 10*3/uL (ref 0.7–3.1)
Lymphs: 31 %
MCH: 31.1 pg (ref 26.6–33.0)
MCHC: 36.1 g/dL — AB (ref 31.5–35.7)
MCV: 86 fL (ref 79–97)
MONOS ABS: 0.4 10*3/uL (ref 0.1–0.9)
Monocytes: 8 %
NEUTROS PCT: 57 %
Neutrophils Absolute: 3.1 10*3/uL (ref 1.4–7.0)
PLATELETS: 248 10*3/uL (ref 150–379)
RBC: 4.76 x10E6/uL (ref 4.14–5.80)
RDW: 12.4 % (ref 12.3–15.4)
WBC: 5.5 10*3/uL (ref 3.4–10.8)

## 2016-03-12 LAB — COMPREHENSIVE METABOLIC PANEL
ALK PHOS: 64 IU/L (ref 39–117)
ALT: 20 IU/L (ref 0–44)
AST: 17 IU/L (ref 0–40)
Albumin/Globulin Ratio: 2 (ref 1.2–2.2)
Albumin: 4.5 g/dL (ref 3.5–5.5)
BILIRUBIN TOTAL: 0.6 mg/dL (ref 0.0–1.2)
BUN/Creatinine Ratio: 15 (ref 9–20)
BUN: 13 mg/dL (ref 6–24)
CHLORIDE: 101 mmol/L (ref 96–106)
CO2: 22 mmol/L (ref 18–29)
CREATININE: 0.88 mg/dL (ref 0.76–1.27)
Calcium: 9.2 mg/dL (ref 8.7–10.2)
GFR calc non Af Amer: 100 mL/min/{1.73_m2} (ref >59)
GFR, EST AFRICAN AMERICAN: 116 mL/min/{1.73_m2} (ref >59)
GLUCOSE: 103 mg/dL — AB (ref 65–99)
Globulin, Total: 2.3 g/dL (ref 1.5–4.5)
Potassium: 4.2 mmol/L (ref 3.5–5.2)
Sodium: 139 mmol/L (ref 134–144)
TOTAL PROTEIN: 6.8 g/dL (ref 6.0–8.5)

## 2016-03-12 LAB — LIPID PANEL WITH LDL/HDL RATIO
Cholesterol, Total: 177 mg/dL (ref 100–199)
HDL: 51 mg/dL (ref >39)
LDL Calculated: 106 mg/dL — ABNORMAL HIGH (ref 0–99)
LDL/HDL RATIO: 2.1 ratio (ref 0.0–3.6)
TRIGLYCERIDES: 100 mg/dL (ref 0–149)
VLDL Cholesterol Cal: 20 mg/dL (ref 5–40)

## 2016-03-12 LAB — TSH: TSH: 1.39 u[IU]/mL (ref 0.450–4.500)

## 2016-03-12 LAB — PSA: PROSTATE SPECIFIC AG, SERUM: 1.6 ng/mL (ref 0.0–4.0)

## 2016-03-13 ENCOUNTER — Ambulatory Visit (INDEPENDENT_AMBULATORY_CARE_PROVIDER_SITE_OTHER): Payer: BC Managed Care – PPO | Admitting: Family Medicine

## 2016-03-13 ENCOUNTER — Encounter: Payer: Self-pay | Admitting: Family Medicine

## 2016-03-13 VITALS — BP 118/74 | HR 82 | Temp 98.2°F | Resp 14 | Ht 71.0 in | Wt 197.2 lb

## 2016-03-13 DIAGNOSIS — Z1211 Encounter for screening for malignant neoplasm of colon: Secondary | ICD-10-CM

## 2016-03-13 DIAGNOSIS — Z23 Encounter for immunization: Secondary | ICD-10-CM

## 2016-03-13 DIAGNOSIS — Z0000 Encounter for general adult medical examination without abnormal findings: Secondary | ICD-10-CM

## 2016-03-13 LAB — POCT URINALYSIS DIPSTICK
Bilirubin, UA: NEGATIVE
Glucose, UA: NEGATIVE
Ketones, UA: NEGATIVE
Leukocytes, UA: NEGATIVE
NITRITE UA: NEGATIVE
PH UA: 6
Protein, UA: NEGATIVE
RBC UA: NEGATIVE
SPEC GRAV UA: 1.01
Urobilinogen, UA: 0.2

## 2016-03-13 LAB — IFOBT (OCCULT BLOOD): IFOBT: NEGATIVE

## 2016-03-13 NOTE — Progress Notes (Signed)
Patient: Jorge [name], Male    DOB: 02-Oct-1965, 50 y.o.   MRN: [id number] Visit Date: 03/13/2016  Today's Provider: Wilhemena Durie, MD   Chief Complaint  Patient presents with  . Annual Exam   Subjective:    Annual physical exam Jorge [name] is a 50 y.o. male who presents today for health maintenance and complete physical. He feels well. He reports exercising 3-4 times a week. He reports he is sleeping well. He retired from [employer] Feb 1st,2017 and is now looking at going back to some sort of [employer] work.he feels well. ----------------------------------------------------------------- Immunization History  Administered Date(s) Administered  . Tdap 02/09/2013   Colonoscopy- 01/19/15 entire colon was normal repeat in 10 years  Review of Systems  Reason unable to perform ROS: per pt none of the symptoms checked are new.   Constitutional: Negative.   HENT: Negative.   Eyes: Negative.   Respiratory: Negative.   Cardiovascular: Positive for palpitations.  Gastrointestinal: Negative.   Endocrine: Negative.   Genitourinary: Negative.   Musculoskeletal: Positive for arthralgias, myalgias, neck pain and neck stiffness.  Skin: Negative.   Allergic/Immunologic: Negative.   Neurological: Negative.   Hematological: Bruises/bleeds easily.  Psychiatric/Behavioral: Negative.     Social History      He  reports that he has never smoked. He has never used smokeless tobacco. He reports that he drinks about 1.2 oz of alcohol per week . He reports that he does not use drugs.       Social History   Social History  . Marital status: Married    Spouse name: N/A  . Number of children: N/A  . Years of education: N/A   Occupational History  . [employer] Server w/ [employer]    Social History Main Topics  . Smoking status: Never Smoker  . Smokeless tobacco: Never Used  . Alcohol use 1.2 oz/week    2 Cans of beer per week     Comment:  occasionally  . Drug use: No  . Sexual activity: Yes   Other Topics Concern  . None   Social History Narrative   Lives in [place] with his spouse.  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[employer]    Past Medical History:  Diagnosis Date  . Atrial fibrillation ([place])    first diagnosed 2011; s/p DCCV at that time; ablation 10-2013 & 03/2014; failed multaq, flecainide and amiodarone  . Chronic anticoagulation    Xarelto  . Chronic kidney disease    H/O KIDNEY STONES  . Dysrhythmia   . Headache   . Hyperlipidemia   . Hypertension   . Sinusitis      Patient Active Problem List   Diagnosis Date Noted  . Recurrent left inguinal hernia 03/23/2015  . Allergic rhinitis 03/08/2015  . Personal history of surgery to heart and great vessels, presenting hazards to health 03/08/2015  . HLD (hyperlipidemia) 03/08/2015  . Atrial fibrillation with rapid ventricular response ([place]) 07/04/2014  . Chronic anticoagulation - Xarelto   . Paroxysmal atrial fibrillation ([place]) 03/29/2014  . Essential hypertension 12/15/2013  . Atrial fibrillation ([place]) 06/13/2013    Past Surgical History:  Procedure Laterality Date  . APPENDECTOMY  1975  . ATRIAL FIBRILLATION ABLATION N/A 11/02/2013   PVI by Dr Rayann Heman  . ATRIAL FIBRILLATION ABLATION N/A 03/29/2014   PVI and CTI by Dr Rayann Heman  . CARDIOVERSION     Hx of  . CARDIOVERSION  N/A 10/15/2013   Procedure: CARDIOVERSION;  Surgeon: Fay Records, MD;  Location: [facility];  Service: Cardiovascular;  Laterality: N/A;  . COLONOSCOPY  2016  . HERNIA REPAIR Left 05/28/2013   left inguinal henria repair, robotic direct defect repaired with Bard ultralight mesh.  Marland Kitchen HERNIA REPAIR Left 05/31/2015   Large Atrium pro loop mesh, direct defect.   . INGUINAL HERNIA REPAIR Left 05/31/2015   Procedure: HERNIA REPAIR INGUINAL ADULT;  Surgeon: Robert Bellow, MD;  Location: [facility] OR[facility];  Service: General;  Laterality: Left;  . KNEE SURGERY  2009   [facility]  . RHINOPLASTY  2006   [facility] Nadeen Landau, [facility]  . TEE WITHOUT CARDIOVERSION N/A 11/01/2013   Procedure: TRANSESOPHAGEAL ECHOCARDIOGRAM (TEE);  Surgeon: Candee Furbish, MD;  Location: [facility] ENDOSCOPY;  Service: Cardiovascular;  Laterality: N/A;  . TEE WITHOUT CARDIOVERSION N/A 03/28/2014   Procedure: TRANSESOPHAGEAL ECHOCARDIOGRAM (TEE);  Surgeon: Larey Dresser, MD;  Location: [facility] ENDOSCOPY;  Service: Cardiovascular;  Laterality: N/A;    Family History        Family Status  Relation Status  . Mother Deceased at age 6  . Maternal Grandfather Deceased at age 4   from MI  . Brother Alive   stable health, mental health issues  . Paternal Grandmother Deceased   From CVA  . Father Deceased at age 59   suicide  . Maternal Grandmother Deceased  .          His family history includes Atrial fibrillation in his maternal grandmother; CVA in his paternal grandmother; Cancer in his mother; Heart attack (age of onset: 22) in his maternal grandfather; Heart failure in his brother; Hypertension in his maternal grandfather.     Allergies  Allergen Reactions  . Oxycodone Nausea And Vomiting  . Vicodin [Hydrocodone-Acetaminophen] Nausea And Vomiting    Can take regular Tylenol     Current Outpatient Prescriptions:  .  atorvastatin (LIPITOR) 10 MG tablet, TAKE 1 TABLET BY MOUTH EVERY DAY, Disp: 30 tablet, Rfl: 5 .  cetirizine (ZYRTEC) 10 MG tablet, Take 1 tablet (10 mg total) by mouth daily as needed for allergies., Disp: 30 tablet, Rfl: 12 .  diltiazem (CARDIZEM CD) 180 MG 24 hr capsule, TAKE 1 CAPSULE (180 MG TOTAL) BY MOUTH DAILY., Disp: 90 capsule, Rfl: 3 .  diltiazem (CARDIZEM) 30 MG tablet, TAKE 1-2 TABLETS BY MOUTH EVERY 6 HOURS AS NEEDED FOR PALPITAIONS, Disp: 30 tablet, Rfl: 6 .  fluticasone (FLONASE) 50 MCG/ACT nasal spray, Place 1 spray into both nostrils daily as needed for allergies. , Disp: , Rfl:  .  TIKOSYN 500 MCG capsule, Take 1 capsule (500 mcg total) by mouth 2 (two)  times daily., Disp: 180 capsule, Rfl: 3 .  XARELTO 20 MG TABS tablet, TAKE 1 TABLET BY MOUTH DAILY WITH SUPPER, Disp: 30 tablet, Rfl: 11   Patient Care Team: Jerrol Banana., MD as PCP - General (Family Medicine) Jerrol Banana., MD (Family Medicine) Robert Bellow, MD (General Surgery)      Objective:   Vitals: There were no vitals taken for this visit.   Physical Exam  Constitutional: He is oriented to person, place, and time. He appears well-developed and well-nourished.  HENT:  Head: Normocephalic and atraumatic.  Right Ear: External ear normal.  Left Ear: External ear normal.  Nose: Nose normal.  Mouth/Throat: Oropharynx is clear and moist.  Eyes: Conjunctivae and EOM are normal. Pupils are equal, round, and reactive to  light.  Neck: Normal range of motion. Neck supple.  Cardiovascular: Normal rate, regular rhythm, normal heart sounds and intact distal pulses.   Pulmonary/Chest: Effort normal and breath sounds normal.  Abdominal: Soft. Bowel sounds are normal.  Genitourinary: Rectum normal, prostate normal and penis normal.  Musculoskeletal: Normal range of motion.  Neurological: He is alert and oriented to person, place, and time. He has normal reflexes.  Skin: Skin is warm and dry.  Psychiatric: He has a normal mood and affect. His behavior is normal. Judgment and thought content normal.     Depression Screen PHQ 2/9 Scores 03/08/2015  PHQ - 2 Score 0      Assessment & Plan:     Routine Health Maintenance and Physical Exam  Exercise Activities and Dietary recommendations Goals    None      Immunization History  Administered Date(s) Administered  . Tdap 02/09/2013    Health Maintenance  Topic Date Due  . HIV Screening  08/13/1980  . INFLUENZA VACCINE  11/28/2015  . TETANUS/TDAP  02/10/2023  . COLONOSCOPY  01/18/2025    Overall health is good.   he and his wife have 1 child and are considering foster care for other  children. Discussed health benefits of physical activity, and encouraged him to engage in regular exercise appropriate for his age and condition.   Afib/  S/p ablation Followed by EP cardiology. S/p Appendectomy /Inguinal Hernia repair   --------------------------------------------------------------------   I have done the exam and reviewed the above chart and it is accurate to the best of my knowledge. Development worker, community has been used in this note in any air is in the dictation or transcription are unintentional.  Wilhemena Durie, MD  [place]

## 2016-03-14 ENCOUNTER — Other Ambulatory Visit: Payer: Self-pay | Admitting: Family Medicine

## 2016-03-26 ENCOUNTER — Telehealth: Payer: Self-pay

## 2016-03-26 NOTE — Telephone Encounter (Signed)
Patient was advised. KW 

## 2016-03-26 NOTE — Telephone Encounter (Signed)
-----   Message from Jerrol Banana., MD sent at 03/26/2016  8:27 AM EST ----- Labs okay.

## 2016-03-26 NOTE — Telephone Encounter (Signed)
[id number] 

## 2016-04-14 ENCOUNTER — Other Ambulatory Visit: Payer: Self-pay | Admitting: Family Medicine

## 2016-04-15 ENCOUNTER — Telehealth: Payer: Self-pay

## 2016-04-15 ENCOUNTER — Telehealth: Payer: Self-pay | Admitting: Internal Medicine

## 2016-04-15 NOTE — Telephone Encounter (Signed)
Fax from [facility] stating brand Tikosyn was denied. Stated he must try and fail Dofetilide first. When I called the patient to relay this, he states he just picked up a 90 day supply of Tikosyn. I advised him to let us know when he is close to being out, then we would send in a new Rx for the generic. He is agreeable to this plan.

## 2016-04-15 NOTE — Telephone Encounter (Signed)
Lynda Reiland called and spoke with patient

## 2016-04-15 NOTE — Telephone Encounter (Signed)
New message    Patient calling stating that someone call him today -- returning call back

## 2016-06-17 ENCOUNTER — Encounter: Payer: Self-pay | Admitting: Internal Medicine

## 2016-06-23 ENCOUNTER — Other Ambulatory Visit: Payer: Self-pay | Admitting: Internal Medicine

## 2016-06-26 ENCOUNTER — Encounter: Payer: Self-pay | Admitting: Internal Medicine

## 2016-06-26 ENCOUNTER — Ambulatory Visit (INDEPENDENT_AMBULATORY_CARE_PROVIDER_SITE_OTHER): Payer: BC Managed Care – PPO | Admitting: Internal Medicine

## 2016-06-26 VITALS — BP 134/74 | HR 109 | Ht 70.0 in | Wt 199.1 lb

## 2016-06-26 DIAGNOSIS — I480 Paroxysmal atrial fibrillation: Secondary | ICD-10-CM | POA: Diagnosis not present

## 2016-06-26 DIAGNOSIS — R0789 Other chest pain: Secondary | ICD-10-CM | POA: Diagnosis not present

## 2016-06-26 DIAGNOSIS — I10 Essential (primary) hypertension: Secondary | ICD-10-CM | POA: Diagnosis not present

## 2016-06-26 NOTE — Progress Notes (Signed)
PCP: Richard Gilbert Jr, MD  Jorge [name] is a 50 y.o. male who presents today for routine electrophysiology followup.  He has had increasing afib.  He thinks that he is in afib about 30% of the time. Today, he denies symptoms of chest pain, shortness of breath,  lower extremity edema, dizziness, presyncope, or syncope.  The patient is otherwise without complaint today.   Past Medical History:  Diagnosis Date  . Atrial fibrillation ([facility])    first diagnosed 2011; s/p DCCV at that time; ablation 10-2013 & 03/2014; failed multaq, flecainide and amiodarone  . Chronic anticoagulation    Xarelto  . Chronic kidney disease    H/O KIDNEY STONES  . Dysrhythmia   . Headache   . Hyperlipidemia   . Hypertension   . Sinusitis    Past Surgical History:  Procedure Laterality Date  . APPENDECTOMY  1975  . ATRIAL FIBRILLATION ABLATION N/A 11/02/2013   PVI by Dr Verl Whitmore  . ATRIAL FIBRILLATION ABLATION N/A 03/29/2014   PVI and CTI by Dr Zoya Sprecher  . CARDIOVERSION     Hx of  . CARDIOVERSION N/A 10/15/2013   Procedure: CARDIOVERSION;  Surgeon: Paula Ross V, MD;  Location: [facility] ENDOSCOPY;  Service: Cardiovascular;  Laterality: N/A;  . COLONOSCOPY  2016  . HERNIA REPAIR Left 05/28/2013   left inguinal henria repair, robotic direct defect repaired with Bard ultralight mesh.  . HERNIA REPAIR Left 05/31/2015   Large Atrium pro loop mesh, direct defect.   . INGUINAL HERNIA REPAIR Left 05/31/2015   Procedure: HERNIA REPAIR INGUINAL ADULT;  Surgeon: Jeffrey W Byrnett, MD;  Location: [facility] ORS;  Service: General;  Laterality: Left;  . KNEE SURGERY  2009   [facility]  . RHINOPLASTY  2006   Dr. Madison Clark, [facility]  . TEE WITHOUT CARDIOVERSION N/A 11/01/2013   Procedure: TRANSESOPHAGEAL ECHOCARDIOGRAM (TEE);  Surgeon: Mark Skains, MD;  Location: [facility] ENDOSCOPY;  Service: Cardiovascular;  Laterality: N/A;  . TEE WITHOUT CARDIOVERSION N/A 03/28/2014   Procedure: TRANSESOPHAGEAL ECHOCARDIOGRAM (TEE);   Surgeon: Dalton S McLean, MD;  Location: [facility] ENDOSCOPY;  Service: Cardiovascular;  Laterality: N/A;    ROS- all systems are reviewed and negatives except as per HPI above  Current Outpatient Prescriptions  Medication Sig Dispense Refill  . atorvastatin (LIPITOR) 10 MG tablet TAKE 1 TABLET (10 MG TOTAL) BY MOUTH EVERY EVENING. 90 tablet 3  . cetirizine (ZYRTEC) 10 MG tablet TAKE 1 TABLET (10 MG TOTAL) BY MOUTH DAILY AS NEEDED FOR ALLERGIES. 30 tablet 11  . diltiazem (CARDIZEM CD) 180 MG 24 hr capsule TAKE 1 CAPSULE (180 MG TOTAL) BY MOUTH DAILY. 90 capsule 3  . diltiazem (CARDIZEM) 30 MG tablet TAKE 1-2 TABLETS BY MOUTH EVERY 6 HOURS AS NEEDED FOR PALPITAIONS 30 tablet 6  . TIKOSYN 500 MCG capsule TAKE 1 CAPSULE (500 MCG TOTAL) BY MOUTH 2 (TWO) TIMES DAILY. 180 capsule 2  . XARELTO 20 MG TABS tablet TAKE 1 TABLET BY MOUTH DAILY WITH SUPPER 30 tablet 11   No current facility-administered medications for this visit.     Physical Exam: Vitals:   06/26/16 1214  BP: 134/74  Pulse: (!) 109  SpO2: 96%  Weight: 199 lb 2 oz (90.3 kg)  Height: 5' 10" (1.778 m)    GEN- The patient is well appearing, alert and oriented x 3 today.   Head- normocephalic, atraumatic Eyes-  Sclera clear, conjunctiva pink Ears- hearing intact Oropharynx- clear Lungs- Clear to ausculation bilaterally, normal work of   breathing Heart- irregular rate and rhythm, no murmurs, rubs or gallops, PMI not laterally displaced GI- soft, NT, ND, + BS Extremities- no clubbing, cyanosis, or edema  ekg reveals afib 109 bpm  Assessment and Plan:  1. afib Increasing afib dispite medical therapy with tikosyn. Therapeutic strategies for afib including medicine and ablation were discussed in detail with the patient today. Risk, benefits, and alternatives to EP study and radiofrequency ablation for afib were also discussed in detail today. These risks include but are not limited to stroke, bleeding, vascular damage, tamponade,  perforation, damage to the esophagus, lungs, and other structures, pulmonary vein stenosis, worsening renal function, and death. The patient understands these risk and wishes to proceed.  We will therefore proceed with catheter ablation at the next available time.  Cardiac CT prior to ablation to assess for LAA thrombus.  Given SOB and occasional chest pain, will also request visualization of cors with the CT.  2. SOB As above  3. HTN Stable No change required today  Nusaybah Ivie MD, FACC 06/26/2016 2:07 PM   

## 2016-06-26 NOTE — Patient Instructions (Addendum)
Medication Instructions:  Your physician recommends that you continue on your current medications as directed. Please refer to the Current Medication list given to you today.   Labwork: Your physician recommends that you return for lab work today: BMP/CBC    Testing/Procedures:  Your physician has requested that you have cardiac CT. Cardiac computed tomography (CT) is a painless test that uses an x-ray machine to take clear, detailed pictures of your heart. For further information please visit [email or web address]. Please follow instruction sheet as given.---ASAP--07/02/16   Your physician has recommended that you have an ablation. Catheter ablation is a medical procedure used to treat some cardiac arrhythmias (irregular heartbeats). During catheter ablation, a long, thin, flexible tube is put into a blood vessel in your groin (upper thigh), or neck. This tube is called an ablation catheter. It is then guided to your heart through the blood vessel. Radio frequency waves destroy small areas of heart tissue where abnormal heartbeats may cause an arrhythmia to start. Please see the instruction sheet given to you today.---07/04/16   Please arrive at [facility] at 5:30am Do you eat or drink after midnight the night before your procedure Do not take any medications the morning of your procedure Plan for one night stay You will need someone to drive you home after the procedure       Follow-Up: Your physician recommends that you schedule a follow-up appointment in: 4 weeks from 07/04/16 with Roderic Palau, NP and 3 months from 07/04/16 with Dr Rayann Heman    Any Other Special Instructions Will Be Listed Below (If Applicable).     If you need a refill on your cardiac medications before your next appointment, please call your pharmacy.

## 2016-06-27 ENCOUNTER — Telehealth: Payer: Self-pay

## 2016-06-27 LAB — BASIC METABOLIC PANEL
BUN / CREAT RATIO: 16 (ref 9–20)
BUN: 14 mg/dL (ref 6–24)
CO2: 24 mmol/L (ref 18–29)
CREATININE: 0.85 mg/dL (ref 0.76–1.27)
Calcium: 9.8 mg/dL (ref 8.7–10.2)
Chloride: 98 mmol/L (ref 96–106)
GFR calc Af Amer: 117 mL/min/{1.73_m2} (ref >59)
GFR, EST NON AFRICAN AMERICAN: 102 mL/min/{1.73_m2} (ref >59)
Glucose: 94 mg/dL (ref 65–99)
Potassium: 4.2 mmol/L (ref 3.5–5.2)
SODIUM: 140 mmol/L (ref 134–144)

## 2016-06-27 LAB — CBC WITH DIFFERENTIAL/PLATELET
BASOS: 0 %
Basophils Absolute: 0 10*3/uL (ref 0.0–0.2)
EOS (ABSOLUTE): 0.2 10*3/uL (ref 0.0–0.4)
EOS: 2 %
HEMATOCRIT: 42.5 % (ref 37.5–51.0)
HEMOGLOBIN: 14.7 g/dL (ref 13.0–17.7)
IMMATURE GRANULOCYTES: 0 %
Immature Grans (Abs): 0 10*3/uL (ref 0.0–0.1)
Lymphocytes Absolute: 2.1 10*3/uL (ref 0.7–3.1)
Lymphs: 27 %
MCH: 30.6 pg (ref 26.6–33.0)
MCHC: 34.6 g/dL (ref 31.5–35.7)
MCV: 88 fL (ref 79–97)
MONOCYTES: 10 %
MONOS ABS: 0.8 10*3/uL (ref 0.1–0.9)
NEUTROS PCT: 61 %
Neutrophils Absolute: 4.8 10*3/uL (ref 1.4–7.0)
Platelets: 251 10*3/uL (ref 150–379)
RBC: 4.81 x10E6/uL (ref 4.14–5.80)
RDW: 13.6 % (ref 12.3–15.4)
WBC: 8 10*3/uL (ref 3.4–10.8)

## 2016-06-27 NOTE — Telephone Encounter (Signed)
Prior auth for [employer] 500mg  submitted to [facility]. This was denied last year, however patient DID get a 90 day supply then.

## 2016-06-28 NOTE — Telephone Encounter (Signed)
Received approval letter from [place] stating that the PA for the pts Tikosyn has been approved from 06/27/16 until 06/27/2017. I have faxed approval letter to the pts pharmacy [facility] at [phone]. I did receive confirmation that the fax was successful.

## 2016-07-02 ENCOUNTER — Ambulatory Visit (HOSPITAL_COMMUNITY)
Admission: RE | Admit: 2016-07-02 | Discharge: 2016-07-02 | Disposition: A | Discharge status: Home / Self Care | Payer: BC Managed Care – PPO | Source: Ambulatory Visit | Attending: Internal Medicine | Admitting: Internal Medicine

## 2016-07-02 ENCOUNTER — Encounter (HOSPITAL_COMMUNITY): Payer: Self-pay

## 2016-07-02 DIAGNOSIS — I4891 Unspecified atrial fibrillation: Secondary | ICD-10-CM | POA: Diagnosis not present

## 2016-07-02 DIAGNOSIS — I480 Paroxysmal atrial fibrillation: Secondary | ICD-10-CM

## 2016-07-02 DIAGNOSIS — R918 Other nonspecific abnormal finding of lung field: Secondary | ICD-10-CM | POA: Insufficient documentation

## 2016-07-02 MED ORDER — IOPAMIDOL (ISOVUE-370) INJECTION 76%
INTRAVENOUS | Status: AC
  Administered 2016-07-02: 80 mL
  Filled 2016-07-02: qty 100

## 2016-07-04 ENCOUNTER — Encounter (HOSPITAL_COMMUNITY)
Admission: RE | Disposition: A | Discharge status: Home / Self Care | Payer: Self-pay | Source: Ambulatory Visit | Attending: Internal Medicine

## 2016-07-04 ENCOUNTER — Ambulatory Visit (HOSPITAL_COMMUNITY): Payer: BC Managed Care – PPO | Admitting: Certified Registered Nurse Anesthetist

## 2016-07-04 ENCOUNTER — Ambulatory Visit (HOSPITAL_COMMUNITY)
Admission: RE | Admit: 2016-07-04 | Discharge: 2016-07-05 | Disposition: A | Discharge status: Home / Self Care | Payer: BC Managed Care – PPO | Source: Ambulatory Visit | Attending: Internal Medicine | Admitting: Internal Medicine

## 2016-07-04 ENCOUNTER — Encounter (HOSPITAL_COMMUNITY): Payer: Self-pay | Admitting: *Deleted

## 2016-07-04 DIAGNOSIS — I4892 Unspecified atrial flutter: Secondary | ICD-10-CM | POA: Diagnosis not present

## 2016-07-04 DIAGNOSIS — I480 Paroxysmal atrial fibrillation: Secondary | ICD-10-CM | POA: Diagnosis not present

## 2016-07-04 DIAGNOSIS — N189 Chronic kidney disease, unspecified: Secondary | ICD-10-CM | POA: Diagnosis not present

## 2016-07-04 DIAGNOSIS — Z7901 Long term (current) use of anticoagulants: Secondary | ICD-10-CM | POA: Insufficient documentation

## 2016-07-04 DIAGNOSIS — E785 Hyperlipidemia, unspecified: Secondary | ICD-10-CM | POA: Insufficient documentation

## 2016-07-04 DIAGNOSIS — I129 Hypertensive chronic kidney disease with stage 1 through stage 4 chronic kidney disease, or unspecified chronic kidney disease: Secondary | ICD-10-CM | POA: Insufficient documentation

## 2016-07-04 DIAGNOSIS — I4891 Unspecified atrial fibrillation: Secondary | ICD-10-CM | POA: Diagnosis present

## 2016-07-04 HISTORY — PX: ATRIAL FIBRILLATION ABLATION: EP1191

## 2016-07-04 LAB — POCT ACTIVATED CLOTTING TIME
ACTIVATED CLOTTING TIME: 296 s
ACTIVATED CLOTTING TIME: 318 s
Activated Clotting Time: 158 s

## 2016-07-04 SURGERY — ATRIAL FIBRILLATION ABLATION
Anesthesia: Monitor Anesthesia Care

## 2016-07-04 MED ORDER — SODIUM CHLORIDE 0.9 % IV SOLN
INTRAVENOUS | Status: DC
  Administered 2016-07-04: 06:00:00 via INTRAVENOUS

## 2016-07-04 MED ORDER — ISOPROTERENOL HCL 0.2 MG/ML IJ SOLN
INTRAMUSCULAR | Status: AC
  Filled 2016-07-04: qty 5

## 2016-07-04 MED ORDER — RIVAROXABAN 20 MG PO TABS
20.0000 mg | ORAL_TABLET | Freq: Every day | ORAL | Status: DC
  Administered 2016-07-04: 20 mg via ORAL
  Filled 2016-07-04: qty 1

## 2016-07-04 MED ORDER — ADENOSINE 6 MG/2ML IV SOLN
INTRAVENOUS | Status: DC | PRN
  Administered 2016-07-04 (×2): 12 mg via INTRAVENOUS

## 2016-07-04 MED ORDER — ISOPROTERENOL HCL 0.2 MG/ML IJ SOLN
INTRAVENOUS | Status: DC | PRN
  Administered 2016-07-04: 20 ug/min via INTRAVENOUS

## 2016-07-04 MED ORDER — MIDAZOLAM HCL 5 MG/5ML IJ SOLN
INTRAMUSCULAR | Status: DC | PRN
  Administered 2016-07-04: 2 mg via INTRAVENOUS

## 2016-07-04 MED ORDER — SODIUM CHLORIDE 0.9% FLUSH: 3.0000 mL | INTRAVENOUS | Status: DC | PRN

## 2016-07-04 MED ORDER — FENTANYL CITRATE (PF) 100 MCG/2ML IJ SOLN
25.0000 ug | INTRAMUSCULAR | Status: DC | PRN
  Administered 2016-07-04 (×2): 50 ug via INTRAVENOUS

## 2016-07-04 MED ORDER — DOFETILIDE 500 MCG PO CAPS
500.0000 ug | ORAL_CAPSULE | Freq: Two times a day (BID) | ORAL | Status: DC
  Administered 2016-07-04 – 2016-07-05 (×2): 500 ug via ORAL
  Filled 2016-07-04: qty 1

## 2016-07-04 MED ORDER — SODIUM CHLORIDE 0.9% FLUSH
3.0000 mL | Freq: Two times a day (BID) | INTRAVENOUS | Status: DC
  Administered 2016-07-04 (×2): 3 mL via INTRAVENOUS

## 2016-07-04 MED ORDER — HEPARIN SODIUM (PORCINE) 1000 UNIT/ML IJ SOLN
INTRAMUSCULAR | Status: DC | PRN
  Administered 2016-07-04: 2000 [IU] via INTRAVENOUS
  Administered 2016-07-04: 4000 [IU] via INTRAVENOUS

## 2016-07-04 MED ORDER — DILTIAZEM HCL ER COATED BEADS 180 MG PO CP24
180.0000 mg | ORAL_CAPSULE | Freq: Every day | ORAL | Status: DC
  Filled 2016-07-04: qty 1

## 2016-07-04 MED ORDER — SODIUM CHLORIDE 0.9 % IV SOLN: 250.0000 mL | INTRAVENOUS | Status: DC | PRN

## 2016-07-04 MED ORDER — DILTIAZEM HCL ER COATED BEADS 180 MG PO CP24
180.0000 mg | ORAL_CAPSULE | Freq: Every day | ORAL | Status: DC
  Administered 2016-07-04: 180 mg via ORAL

## 2016-07-04 MED ORDER — DOFETILIDE 500 MCG PO CAPS
500.0000 ug | ORAL_CAPSULE | Freq: Two times a day (BID) | ORAL | Status: DC
  Filled 2016-07-04: qty 1

## 2016-07-04 MED ORDER — PHENYLEPHRINE 40 MCG/ML (10ML) SYRINGE FOR IV PUSH (FOR BLOOD PRESSURE SUPPORT)
PREFILLED_SYRINGE | INTRAVENOUS | Status: DC | PRN
  Administered 2016-07-04: 40 ug via INTRAVENOUS

## 2016-07-04 MED ORDER — FENTANYL CITRATE (PF) 100 MCG/2ML IJ SOLN
INTRAMUSCULAR | Status: DC | PRN
  Administered 2016-07-04: 50 ug via INTRAVENOUS
  Administered 2016-07-04 (×3): 25 ug via INTRAVENOUS
  Administered 2016-07-04: 50 ug via INTRAVENOUS
  Administered 2016-07-04: 25 ug via INTRAVENOUS

## 2016-07-04 MED ORDER — IOPAMIDOL (ISOVUE-370) INJECTION 76%
INTRAVENOUS | Status: AC
  Filled 2016-07-04: qty 50

## 2016-07-04 MED ORDER — ONDANSETRON HCL 4 MG/2ML IJ SOLN: 4.0000 mg | Freq: Four times a day (QID) | INTRAMUSCULAR | Status: DC | PRN

## 2016-07-04 MED ORDER — BUPIVACAINE HCL (PF) 0.25 % IJ SOLN
INTRAMUSCULAR | Status: DC | PRN
  Administered 2016-07-04: 15 mL

## 2016-07-04 MED ORDER — PROPOFOL 500 MG/50ML IV EMUL
INTRAVENOUS | Status: DC | PRN
  Administered 2016-07-04: 100 ug/kg/min via INTRAVENOUS
  Administered 2016-07-04: 09:00:00 via INTRAVENOUS

## 2016-07-04 MED ORDER — BUPIVACAINE HCL (PF) 0.25 % IJ SOLN
INTRAMUSCULAR | Status: AC
  Filled 2016-07-04: qty 30

## 2016-07-04 MED ORDER — IOPAMIDOL (ISOVUE-370) INJECTION 76%
INTRAVENOUS | Status: DC | PRN
  Administered 2016-07-04: 3 mL

## 2016-07-04 MED ORDER — PROTAMINE SULFATE 10 MG/ML IV SOLN
INTRAVENOUS | Status: DC | PRN
  Administered 2016-07-04: 30 mg via INTRAVENOUS

## 2016-07-04 MED ORDER — ADENOSINE 6 MG/2ML IV SOLN
INTRAVENOUS | Status: AC
  Filled 2016-07-04: qty 2

## 2016-07-04 MED ORDER — LACTATED RINGERS IV SOLN
INTRAVENOUS | Status: DC | PRN
  Administered 2016-07-04: 11:00:00 via INTRAVENOUS

## 2016-07-04 MED ORDER — ACETAMINOPHEN 325 MG PO TABS: 650.0000 mg | ORAL_TABLET | ORAL | Status: DC | PRN

## 2016-07-04 MED ORDER — HEPARIN SODIUM (PORCINE) 1000 UNIT/ML IJ SOLN
INTRAMUSCULAR | Status: DC | PRN
  Administered 2016-07-04: 1000 [IU] via INTRAVENOUS

## 2016-07-04 MED ORDER — HEPARIN SODIUM (PORCINE) 1000 UNIT/ML IJ SOLN
INTRAMUSCULAR | Status: DC | PRN
  Administered 2016-07-04: 1000 [IU] via INTRAVENOUS
  Administered 2016-07-04: 12000 [IU] via INTRAVENOUS

## 2016-07-04 MED ORDER — PHENYLEPHRINE HCL 10 MG/ML IJ SOLN
INTRAVENOUS | Status: DC | PRN
  Administered 2016-07-04: 15 ug/min via INTRAVENOUS

## 2016-07-04 MED ORDER — LIDOCAINE 2% (20 MG/ML) 5 ML SYRINGE
INTRAMUSCULAR | Status: DC | PRN
  Administered 2016-07-04: 20 mg via INTRAVENOUS

## 2016-07-04 MED ORDER — PROPOFOL 10 MG/ML IV BOLUS
INTRAVENOUS | Status: DC | PRN
  Administered 2016-07-04 (×3): 20 mg via INTRAVENOUS

## 2016-07-04 MED ORDER — HEPARIN SODIUM (PORCINE) 1000 UNIT/ML IJ SOLN
INTRAMUSCULAR | Status: AC
  Filled 2016-07-04: qty 1

## 2016-07-04 MED ORDER — FENTANYL CITRATE (PF) 100 MCG/2ML IJ SOLN
INTRAMUSCULAR | Status: AC
  Filled 2016-07-04: qty 2

## 2016-07-04 MED ORDER — ADENOSINE 6 MG/2ML IV SOLN
INTRAVENOUS | Status: AC
Start: 2016-07-04 — End: 2016-07-04
  Filled 2016-07-04: qty 2

## 2016-07-04 MED ORDER — HYDROCODONE-ACETAMINOPHEN 5-325 MG PO TABS: 1.0000 | ORAL_TABLET | ORAL | Status: DC | PRN

## 2016-07-04 MED ORDER — ADENOSINE 6 MG/2ML IV SOLN
INTRAVENOUS | Status: AC
  Filled 2016-07-04: qty 4

## 2016-07-04 SURGICAL SUPPLY — 17 items
BAG SNAP BAND KOVER 36X36 (MISCELLANEOUS) ×2 IMPLANT
BLANKET WARM UNDERBOD FULL ACC (MISCELLANEOUS) ×2 IMPLANT
CATH NAVISTAR SMARTTOUCH DF (ABLATOR) ×2 IMPLANT
CATH SOUNDSTAR 3D IMAGING (CATHETERS) ×2 IMPLANT
CATH VARIABLE LASSO NAV 2515 (CATHETERS) ×2 IMPLANT
CATH WEBSTER BI DIR CS D-F CRV (CATHETERS) ×2 IMPLANT
COVER SWIFTLINK CONNECTOR (BAG) ×2 IMPLANT
NEEDLE TRANSEP BRK 71CM 407200 (NEEDLE) ×2 IMPLANT
PACK EP LATEX FREE (CUSTOM PROCEDURE TRAY) ×1
PACK EP LF (CUSTOM PROCEDURE TRAY) ×1 IMPLANT
PAD DEFIB LIFELINK (PAD) ×2 IMPLANT
PATCH CARTO3 (PAD) ×2 IMPLANT
SHEATH AVANTI 11F 11CM (SHEATH) ×2 IMPLANT
SHEATH PINNACLE 7F 10CM (SHEATH) ×4 IMPLANT
SHEATH PINNACLE 9F 10CM (SHEATH) ×2 IMPLANT
SHEATH SWARTZ TS SL2 63CM 8.5F (SHEATH) ×2 IMPLANT
TUBING SMART ABLATE COOLFLOW (TUBING) ×2 IMPLANT

## 2016-07-04 NOTE — Interval H&P Note (Signed)
History and Physical Interval Note:  07/04/2016 7:38 AM  [name]  has presented today for surgery, with the diagnosis of afib  The various methods of treatment have been discussed with the patient and family. After consideration of risks, benefits and other options for treatment, the patient has consented to  Procedure(s): Atrial Fibrillation Ablation (N/A) as a surgical intervention .  The patient's history has been reviewed, patient examined, no change in status, stable for surgery.  I have reviewed the patient's chart and labs.  Questions were answered to the patient's satisfaction.     Jorge [name]

## 2016-07-04 NOTE — Discharge Instructions (Signed)
No driving for 3 days. No lifting over 5 lbs for 1 week. No vigorous or sexual activity for 1 week. You may return to work on 07/11/16. Keep procedure site clean & dry. If you notice increased pain, swelling, bleeding or pus, call/return!  You may shower, but no soaking baths/hot tubs/pools for 1 week.

## 2016-07-04 NOTE — Anesthesia Preprocedure Evaluation (Signed)
Anesthesia Evaluation  Patient identified by MRN, date of birth, ID band Patient awake    Reviewed: Allergy & Precautions, NPO status , Patient's Chart, lab work & pertinent test results  Airway Mallampati: II  TM Distance: >3 FB Neck ROM: Full    Dental no notable dental hx.    Pulmonary neg pulmonary ROS,    Pulmonary exam normal breath sounds clear to auscultation       Cardiovascular hypertension, Normal cardiovascular exam+ dysrhythmias Atrial Fibrillation  Rhythm:Regular Rate:Normal     Neuro/Psych negative neurological ROS  negative psych ROS   GI/Hepatic negative GI ROS, Neg liver ROS,   Endo/Other  negative endocrine ROS  Renal/GU negative Renal ROS  negative genitourinary   Musculoskeletal negative musculoskeletal ROS (+)   Abdominal   Peds negative pediatric ROS (+)  Hematology negative hematology ROS (+)   Anesthesia Other Findings   Reproductive/Obstetrics negative OB ROS                             Anesthesia Physical Anesthesia Plan  ASA: III  Anesthesia Plan: MAC   Post-op Pain Management:    Induction: Intravenous  Airway Management Planned: Simple Face Mask  Additional Equipment:   Intra-op Plan:   Post-operative Plan:   Informed Consent: I have reviewed the patients History and Physical, chart, labs and discussed the procedure including the risks, benefits and alternatives for the proposed anesthesia with the patient or authorized representative who has indicated his/her understanding and acceptance.   Dental advisory given  Plan Discussed with: CRNA and Surgeon  Anesthesia Plan Comments:         Anesthesia Quick Evaluation

## 2016-07-04 NOTE — Transfer of Care (Signed)
Immediate Anesthesia Transfer of Care Note  Patient: Jorge [name]  Procedure(s) Performed: Procedure(s): Atrial Fibrillation Ablation (N/A)  Patient Location: Cath lab holding  Anesthesia Type:MAC  Level of Consciousness: awake, alert , oriented and patient cooperative  Airway & Oxygen Therapy: Patient Spontanous Breathing and Patient connected to face mask oxygen  Post-op Assessment: Report given to RN, Post -op Vital signs reviewed and stable and Patient moving all extremities X 4  Post vital signs: Reviewed and stable  Last Vitals:  Vitals:   07/04/16 0536  BP: (!) 153/101  Pulse: 80  Resp: 20  Temp: 36.6 C    Last Pain:  Vitals:   07/04/16 0536  TempSrc: Oral         Complications: No apparent anesthesia complications

## 2016-07-04 NOTE — Anesthesia Postprocedure Evaluation (Signed)
Anesthesia Post Note  Patient: Jorge [name]  Procedure(s) Performed: Procedure(s) (LRB): Atrial Fibrillation Ablation (N/A)  Patient location during evaluation: PACU Anesthesia Type: MAC Level of consciousness: awake and alert Pain management: pain level controlled Vital Signs Assessment: post-procedure vital signs reviewed and stable Respiratory status: spontaneous breathing, nonlabored ventilation, respiratory function stable and patient connected to nasal cannula oxygen Cardiovascular status: blood pressure returned to baseline and stable Postop Assessment: no signs of nausea or vomiting Anesthetic complications: no       Last Vitals:  Vitals:   07/04/16 1051 07/04/16 1059  BP:    Pulse:    Resp:    Temp: 36.4 C 36.4 C    Last Pain:  Vitals:   07/04/16 1059  TempSrc: Temporal  PainSc: 7                  Handy Mcloud S

## 2016-07-04 NOTE — H&P (View-Only) (Signed)
PCP: Wilhemena Durie, MD  Jorge [name] is a 51 y.o. male who presents today for routine electrophysiology followup.  He has had increasing afib.  He thinks that he is in afib about 30% of the time. Today, he denies symptoms of chest pain, shortness of breath,  lower extremity edema, dizziness, presyncope, or syncope.  The patient is otherwise without complaint today.   Past Medical History:  Diagnosis Date  . Atrial fibrillation ([place])    first diagnosed 2011; s/p DCCV at that time; ablation 10-2013 & 03/2014; failed multaq, flecainide and amiodarone  . Chronic anticoagulation    Xarelto  . Chronic kidney disease    H/O KIDNEY STONES  . Dysrhythmia   . Headache   . Hyperlipidemia   . Hypertension   . Sinusitis    Past Surgical History:  Procedure Laterality Date  . APPENDECTOMY  1975  . ATRIAL FIBRILLATION ABLATION N/A 11/02/2013   PVI by Dr Rayann Heman  . ATRIAL FIBRILLATION ABLATION N/A 03/29/2014   PVI and CTI by Dr Rayann Heman  . CARDIOVERSION     Hx of  . CARDIOVERSION N/A 10/15/2013   Procedure: CARDIOVERSION;  Surgeon: Fay Records, MD;  Location: [facility];  Service: Cardiovascular;  Laterality: N/A;  . COLONOSCOPY  2016  . HERNIA REPAIR Left 05/28/2013   left inguinal henria repair, robotic direct defect repaired with Bard ultralight mesh.  Marland Kitchen HERNIA REPAIR Left 05/31/2015   Large Atrium pro loop mesh, direct defect.   . INGUINAL HERNIA REPAIR Left 05/31/2015   Procedure: HERNIA REPAIR INGUINAL ADULT;  Surgeon: Robert Bellow, MD;  Location: [facility];  Service: General;  Laterality: Left;  . KNEE SURGERY  2009   [facility]  . RHINOPLASTY  2006   Dr. Nadeen Landau, [facility]  . TEE WITHOUT CARDIOVERSION N/A 11/01/2013   Procedure: TRANSESOPHAGEAL ECHOCARDIOGRAM (TEE);  Surgeon: Candee Furbish, MD;  Location: [facility] ENDOSCOPY;  Service: Cardiovascular;  Laterality: N/A;  . TEE WITHOUT CARDIOVERSION N/A 03/28/2014   Procedure: TRANSESOPHAGEAL ECHOCARDIOGRAM (TEE);   Surgeon: Larey Dresser, MD;  Location: [facility];  Service: Cardiovascular;  Laterality: N/A;    ROS- all systems are reviewed and negatives except as per HPI above  Current Outpatient Prescriptions  Medication Sig Dispense Refill  . atorvastatin (LIPITOR) 10 MG tablet TAKE 1 TABLET (10 MG TOTAL) BY MOUTH EVERY EVENING. 90 tablet 3  . cetirizine (ZYRTEC) 10 MG tablet TAKE 1 TABLET (10 MG TOTAL) BY MOUTH DAILY AS NEEDED FOR ALLERGIES. 30 tablet 11  . diltiazem (CARDIZEM CD) 180 MG 24 hr capsule TAKE 1 CAPSULE (180 MG TOTAL) BY MOUTH DAILY. 90 capsule 3  . diltiazem (CARDIZEM) 30 MG tablet TAKE 1-2 TABLETS BY MOUTH EVERY 6 HOURS AS NEEDED FOR PALPITAIONS 30 tablet 6  . TIKOSYN 500 MCG capsule TAKE 1 CAPSULE (500 MCG TOTAL) BY MOUTH 2 (TWO) TIMES DAILY. 180 capsule 2  . XARELTO 20 MG TABS tablet TAKE 1 TABLET BY MOUTH DAILY WITH SUPPER 30 tablet 11   No current facility-administered medications for this visit.     Physical Exam: Vitals:   06/26/16 1214  BP: 134/74  Pulse: (!) 109  SpO2: 96%  Weight: 199 lb 2 oz (90.3 kg)  Height: 5\' 10"  (1.778 m)    GEN- The patient is well appearing, alert and oriented x 3 today.   Head- normocephalic, atraumatic Eyes-  Sclera clear, conjunctiva pink Ears- hearing intact Oropharynx- clear Lungs- Clear to ausculation bilaterally, normal work of  breathing Heart- irregular rate and rhythm, no murmurs, rubs or gallops, PMI not laterally displaced GI- soft, NT, ND, + BS Extremities- no clubbing, cyanosis, or edema  ekg reveals afib 109 bpm  Assessment and Plan:  1. afib Increasing afib dispite medical therapy with tikosyn. Therapeutic strategies for afib including medicine and ablation were discussed in detail with the patient today. Risk, benefits, and alternatives to EP study and radiofrequency ablation for afib were also discussed in detail today. These risks include but are not limited to stroke, bleeding, vascular damage, tamponade,  perforation, damage to the esophagus, lungs, and other structures, pulmonary vein stenosis, worsening renal function, and death. The patient understands these risk and wishes to proceed.  We will therefore proceed with catheter ablation at the next available time.  Cardiac CT prior to ablation to assess for LAA thrombus.  Given SOB and occasional chest pain, will also request visualization of cors with the CT.  2. SOB As above  3. HTN Stable No change required today  Thompson Grayer MD, [facility] 06/26/2016 2:07 PM

## 2016-07-04 NOTE — Progress Notes (Addendum)
Site area: RFV a 7, 9, 11 french sheaths were removed by Suella Broad RN Site Prior to Removal:  Level 0 Pressure Applied For: 20 Manual:   yes Patient Status During Pull:  stable Post Pull Site:  Level 0 Post Pull Instructions Given:  yes Post Pull Pulses Present: palpable Dressing Applied:  tegaderm and pressure dressing applied Bedrest begins @ 1200p Comments: VS remain stable

## 2016-07-04 NOTE — Discharge Summary (Signed)
ELECTROPHYSIOLOGY PROCEDURE DISCHARGE SUMMARY    Patient ID: Jorge [name],  MRN: [id number], DOB/AGE: [id number] 51 y.o.  Admit date: 07/04/2016 Discharge date: 07/05/16  Primary Care Physician: Wilhemena Durie, MD  Electrophysiologist: Thompson Grayer, MD  Primary Discharge Diagnosis:  1. Paroxysmal AFib     CHA2DS2Vasc is one, on xarelto  Secondary Discharge Diagnosis:  1. HTN 2. HLD  Procedures This Admission:  1.  Electrophysiology study and radiofrequency catheter ablation on 07/04/16 by Dr Thompson Grayer.   This study demonstrated  CONCLUSIONS: 1. Sinus rhythm upon presentation.   2. Mild return of electrical activity within the right superior and left superior pulmonary veins from prior ablation. The inferior and R middle veins were quiescent from the prior ablation.  As the patient had previously had antral isolation, I elected to perform WACA of all PVs today. 3. Successful electrical isolation and anatomical encircling of all 5 pulmonary veins with radiofrequency current.    4. Cavo-tricuspid isthmus ablation was performed with complete bidirectional isthmus block achieved.  Additional ablation performed at the SVC/RA junction 5. No early apparent complications.   Brief HPI: Jorge [name] is a 51 y.o. male with a history of paroxysmal atrial fibrillation.  They have failed medical therapy with Tiksoyn. Risks, benefits, and alternatives to catheter ablation of atrial fibrillation were reviewed with the patient who wished to proceed.  The patient underwent TEE prior to the procedure which demonstrated normal LV function and no LAA thrombus.    Hospital Course:  The patient was admitted and underwent EPS/RFCA of atrial fibrillation with details as outlined above.  He was monitored on telemetry overnight which demonstrated sinus rhythm.  Groin was without complication on the day of discharge.  The patient was examined by Dr. Rayann Heman and considered to be stable for  discharge.  Wound care and restrictions were reviewed with the patient.  The patient will be seen back by Roderic Palau, NP in 4 weeks and Dr Rayann Heman in 12 weeks for post ablation follow up.     Physical Exam: Vitals:   07/04/16 1520 07/04/16 1735 07/04/16 2138 07/05/16 0356  BP: 125/78 128/81 121/77 126/82  Pulse: 84 84 74 78  Resp: (!) 22 19 10 17   Temp:   98.9 F (37.2 C) 98.4 F (36.9 C)  TempSrc:   Oral Oral  SpO2: 97% 96% 95% 92%  Weight:    192 lb 8 oz (87.3 kg)  Height:        GEN- The patient is well appearing, alert and oriented x 3 today.   HEENT: normocephalic, atraumatic; sclera clear, conjunctiva pink; hearing intact; oropharynx clear; neck supple  Lungs-  CTA b/l, normal work of breathing.  No wheezes, rales, rhonchi Heart-  RRR, no murmurs, rubs or gallops  GI- soft, non-tender, non-distended, bowel sounds present  Extremities- no clubbing, cyanosis, or edema;  DP/PT/radial pulses 2+ bilaterally, groin without hematoma/bruit MS- no significant deformity or atrophy Skin- warm and dry, no rash or lesion Psych- euthymic mood, full affect Neuro- strength and sensation are intact   Labs:   Lab Results  Component Value Date   WBC 8.0 06/26/2016   HGB 13.3 03/21/2014   HCT 42.5 06/26/2016   MCV 88 06/26/2016   PLT 251 06/26/2016   No results for input(s): NA, K, CL, CO2, BUN, CREATININE, CALCIUM, PROT, BILITOT, ALKPHOS, ALT, AST, GLUCOSE in the last 168 hours.  Invalid input(s): LABALBU   Discharge Medications:  Allergies as of 07/05/2016  Reactions   Oxycodone Nausea And Vomiting   Vicodin [hydrocodone-acetaminophen] Nausea And Vomiting   Can take regular Tylenol      Medication List    TAKE these medications   atorvastatin 10 MG tablet Commonly known as:  LIPITOR TAKE 1 TABLET (10 MG TOTAL) BY MOUTH EVERY EVENING.   cetirizine 10 MG tablet Commonly known as:  ZYRTEC TAKE 1 TABLET (10 MG TOTAL) BY MOUTH DAILY AS NEEDED FOR ALLERGIES.     diltiazem 180 MG 24 hr capsule Commonly known as:  CARDIZEM CD TAKE 1 CAPSULE (180 MG TOTAL) BY MOUTH DAILY. What changed:  See the new instructions.   diltiazem 30 MG tablet Commonly known as:  CARDIZEM TAKE 1-2 TABLETS BY MOUTH EVERY 6 HOURS AS NEEDED FOR PALPITAIONS   pantoprazole 40 MG tablet Commonly known as:  PROTONIX Take 1 tablet (40 mg total) by mouth daily.   TIKOSYN 500 MCG capsule Generic drug:  dofetilide TAKE 1 CAPSULE (500 MCG TOTAL) BY MOUTH 2 (TWO) TIMES DAILY.   XARELTO 20 MG Tabs tablet Generic drug:  rivaroxaban TAKE 1 TABLET BY MOUTH DAILY WITH SUPPER       Disposition: Home  Follow-up Information    [facility] Follow up on 08/05/2016.   Specialty:  Cardiology Why:  9:30AM Contact information: [address] [id number] m[place] [phone]       Thompson Grayer, MD Follow up on 10/07/2016.   Specialty:  Cardiology Why:  9:45AM Contact information: [place] [address] [phone]           Duration of Discharge Encounter: Greater than 30 minutes including physician time.  Army Fossa MD 07/05/2016 6:49 AM

## 2016-07-04 NOTE — Anesthesia Procedure Notes (Signed)
Procedure Name: MAC Date/Time: 07/04/2016 7:49 AM Performed by: Everlean Cherry A Pre-anesthesia Checklist: Patient identified, Emergency Drugs available, Suction available and Patient being monitored Patient Re-evaluated:Patient Re-evaluated prior to inductionOxygen Delivery Method: Simple face mask

## 2016-07-05 DIAGNOSIS — E785 Hyperlipidemia, unspecified: Secondary | ICD-10-CM | POA: Diagnosis not present

## 2016-07-05 DIAGNOSIS — I480 Paroxysmal atrial fibrillation: Secondary | ICD-10-CM | POA: Diagnosis not present

## 2016-07-05 DIAGNOSIS — I129 Hypertensive chronic kidney disease with stage 1 through stage 4 chronic kidney disease, or unspecified chronic kidney disease: Secondary | ICD-10-CM | POA: Diagnosis not present

## 2016-07-05 DIAGNOSIS — N189 Chronic kidney disease, unspecified: Secondary | ICD-10-CM | POA: Diagnosis not present

## 2016-07-05 MED ORDER — PANTOPRAZOLE SODIUM 40 MG PO TBEC: 40.0000 mg | DELAYED_RELEASE_TABLET | Freq: Every day | ORAL | 0 refills | Status: DC

## 2016-07-05 NOTE — Progress Notes (Signed)
Pt d/c home, in no acute distress.  Advised pt to remove dsg from Right Groin this afternoon (24hr post procedure).  D/C instructions including meds reviewed in detail with pt.  Groin care/instructions reviewed in detail also. Protonix Rx sent to his Pharmacy, pt aware.  No further needs.  Wife to pick up pt this am.

## 2016-07-05 NOTE — Plan of Care (Signed)
Problem: Safety: Goal: Ability to remain free from injury will improve Outcome: Progressing Pt remains free from injuries this shift. Fall risk bundle in place. Hourly rounding performed. Will continue to monitor this shift.

## 2016-07-09 ENCOUNTER — Encounter (HOSPITAL_COMMUNITY): Payer: Self-pay

## 2016-07-10 ENCOUNTER — Telehealth: Payer: Self-pay | Admitting: Internal Medicine

## 2016-07-10 NOTE — Telephone Encounter (Signed)
Faxed over PA to his new [facility]

## 2016-07-10 NOTE — Telephone Encounter (Signed)
New message    Pt c/o medication issue:  1. Name of Medication: TIKOSYN 500 MCG capsule  2. How are you currently taking this medication (dosage and times per day)? 570mcg  3. Are you having a reaction (difficulty breathing--STAT)? no  4. What is your medication issue? Pt does not want generic version

## 2016-08-05 ENCOUNTER — Inpatient Hospital Stay (HOSPITAL_COMMUNITY)
Admission: RE | Admit: 2016-08-05 | Payer: BC Managed Care – PPO | Source: Ambulatory Visit | Admitting: Nurse Practitioner

## 2016-08-08 ENCOUNTER — Other Ambulatory Visit (HOSPITAL_COMMUNITY): Payer: Self-pay | Admitting: *Deleted

## 2016-08-08 ENCOUNTER — Ambulatory Visit (HOSPITAL_COMMUNITY)
Admission: RE | Admit: 2016-08-08 | Discharge: 2016-08-08 | Disposition: A | Discharge status: Home / Self Care | Payer: BC Managed Care – PPO | Source: Ambulatory Visit | Attending: Nurse Practitioner | Admitting: Nurse Practitioner

## 2016-08-08 ENCOUNTER — Encounter (HOSPITAL_COMMUNITY): Payer: Self-pay | Admitting: Nurse Practitioner

## 2016-08-08 VITALS — BP 156/92 | HR 88 | Ht 70.0 in | Wt 191.4 lb

## 2016-08-08 DIAGNOSIS — I4891 Unspecified atrial fibrillation: Secondary | ICD-10-CM | POA: Diagnosis present

## 2016-08-08 DIAGNOSIS — Z79899 Other long term (current) drug therapy: Secondary | ICD-10-CM | POA: Diagnosis not present

## 2016-08-08 DIAGNOSIS — Z7901 Long term (current) use of anticoagulants: Secondary | ICD-10-CM | POA: Insufficient documentation

## 2016-08-08 DIAGNOSIS — I481 Persistent atrial fibrillation: Secondary | ICD-10-CM | POA: Diagnosis not present

## 2016-08-08 DIAGNOSIS — I480 Paroxysmal atrial fibrillation: Secondary | ICD-10-CM | POA: Diagnosis not present

## 2016-08-08 DIAGNOSIS — I4581 Long QT syndrome: Secondary | ICD-10-CM | POA: Diagnosis not present

## 2016-08-08 LAB — BASIC METABOLIC PANEL
ANION GAP: 9 (ref 5–15)
BUN: 11 mg/dL (ref 6–20)
CHLORIDE: 105 mmol/L (ref 101–111)
CO2: 25 mmol/L (ref 22–32)
Calcium: 9.3 mg/dL (ref 8.9–10.3)
Creatinine, Ser: 0.88 mg/dL (ref 0.61–1.24)
GFR calc Af Amer: >60 mL/min (ref >60)
GLUCOSE: 97 mg/dL (ref 65–99)
POTASSIUM: 3.5 mmol/L (ref 3.5–5.1)
Sodium: 139 mmol/L (ref 135–145)

## 2016-08-08 LAB — MAGNESIUM: Magnesium: 1.9 mg/dL (ref 1.7–2.4)

## 2016-08-08 MED ORDER — POTASSIUM CHLORIDE CRYS ER 20 MEQ PO TBCR: 20.0000 meq | EXTENDED_RELEASE_TABLET | Freq: Every day | ORAL | 3 refills | Status: DC

## 2016-08-08 NOTE — Progress Notes (Signed)
Primary Care Physician: Wilhemena Durie, MD Referring Physician: Dr. Valma Cava [name] is a 51 y.o. male with a h/o persistent afib s/p ablation x 3, most recent, 07/04/16,that is in the afib clinic for f/u ablation. He states that he had increase in afib for the first 2 weeks following ablation, but non since then. He denies swallowing or groin issues. Continues on tikosyn and no missed doses of xarelto.  Today, he denies symptoms of palpitations, chest pain, shortness of breath, orthopnea, PND, lower extremity edema, dizziness, presyncope, syncope, or neurologic sequela. The patient is tolerating medications without difficulties and is otherwise without complaint today.   Past Medical History:  Diagnosis Date  . Atrial fibrillation ([place])    first diagnosed 2011; s/p DCCV at that time; ablation 10-2013 & 03/2014; failed multaq, flecainide and amiodarone  . Chronic anticoagulation    Xarelto  . Chronic kidney disease    H/O KIDNEY STONES  . Dysrhythmia   . Headache   . Hyperlipidemia   . Hypertension   . Sinusitis    Past Surgical History:  Procedure Laterality Date  . APPENDECTOMY  1975  . ATRIAL FIBRILLATION ABLATION N/A 11/02/2013   PVI by Dr Jorge [name]  . ATRIAL FIBRILLATION ABLATION N/A 03/29/2014   PVI and CTI by Dr Jorge [name]  . ATRIAL FIBRILLATION ABLATION N/A 07/04/2016   Procedure: Atrial Fibrillation Ablation;  Surgeon: Jorge Grayer, MD;  Location: [facility];  Service: Cardiovascular;  Laterality: N/A;  . CARDIOVERSION     Hx of  . CARDIOVERSION N/A 10/15/2013   Procedure: CARDIOVERSION;  Surgeon: Jorge Records, MD;  Location: [facility];  Service: Cardiovascular;  Laterality: N/A;  . COLONOSCOPY  2016  . HERNIA REPAIR Left 05/28/2013   left inguinal henria repair, robotic direct defect repaired with Bard ultralight mesh.  Marland Kitchen HERNIA REPAIR Left 05/31/2015   Large Atrium pro loop mesh, direct defect.   . INGUINAL HERNIA REPAIR Left 05/31/2015   Procedure: HERNIA  REPAIR INGUINAL ADULT;  Surgeon: Jorge Bellow, MD;  Location: [facility];  Service: General;  Laterality: Left;  . KNEE SURGERY  2009   [facility]  . RHINOPLASTY  2006   [facility]. Nadeen [name], [facility]  . TEE WITHOUT CARDIOVERSION N/A 11/01/2013   Procedure: TRANSESOPHAGEAL ECHOCARDIOGRAM (TEE);  Surgeon: Jorge Furbish, MD;  Location: [facility];  Service: Cardiovascular;  Laterality: N/A;  . TEE WITHOUT CARDIOVERSION N/A 03/28/2014   Procedure: TRANSESOPHAGEAL ECHOCARDIOGRAM (TEE);  Surgeon: Jorge Dresser, MD;  Location: [facility] ENDOSCOPY;  Service: Cardiovascular;  Laterality: N/A;    Current Outpatient Prescriptions  Medication Sig Dispense Refill  . atorvastatin (LIPITOR) 10 MG tablet TAKE 1 TABLET (10 MG TOTAL) BY MOUTH EVERY EVENING. 90 tablet 3  . cetirizine (ZYRTEC) 10 MG tablet TAKE 1 TABLET (10 MG TOTAL) BY MOUTH DAILY AS NEEDED FOR ALLERGIES. 30 tablet 11  . diltiazem (CARDIZEM CD) 180 MG 24 hr capsule TAKE 1 CAPSULE (180 MG TOTAL) BY MOUTH DAILY. (Patient taking differently: TAKE 1 CAPSULE (180 MG TOTAL) BY MOUTH WITH SUPPER.) 90 capsule 3  . diltiazem (CARDIZEM) 30 MG tablet TAKE 1-2 TABLETS BY MOUTH EVERY 6 HOURS AS NEEDED FOR PALPITAIONS 30 tablet 6  . pantoprazole (PROTONIX) 40 MG tablet Take 1 tablet (40 mg total) by mouth daily. 45 tablet 0  . TIKOSYN 500 MCG capsule TAKE 1 CAPSULE (500 MCG TOTAL) BY MOUTH 2 (TWO) TIMES DAILY. 180 capsule 2  . XARELTO 20 MG TABS  tablet TAKE 1 TABLET BY MOUTH DAILY WITH SUPPER 30 tablet 11   No current facility-administered medications for this encounter.     Allergies  Allergen Reactions  . Oxycodone Nausea And Vomiting  . Vicodin [Hydrocodone-Acetaminophen] Nausea And Vomiting    Can take regular Tylenol    Social History   Social History  . Marital status: Married    Spouse name: N/A  . Number of children: N/A  . Years of education: N/A   Occupational History  . [employer] Server w/ [employer]    Social History  Main Topics  . Smoking status: Never Smoker  . Smokeless tobacco: Never Used  . Alcohol use 1.2 oz/week    2 Cans of beer per week     Comment: occasionally  . Drug use: No  . Sexual activity: Yes   Other Topics Concern  . Not on file   Social History Narrative   Lives in [place] with his spouse.  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[employer]    Family History  Problem Relation Age of Onset  . Cancer Mother     breast, cause of death  . Heart attack Maternal Grandfather 53    Cause of death  . Hypertension Maternal Grandfather   . Heart failure Brother   . CVA Paternal Grandmother     cause of death  . Atrial fibrillation Maternal Grandmother   . Colon polyps      Fam Hx    ROS- All systems are reviewed and negative except as per the HPI above  Physical Exam: Vitals:   08/08/16 0832  BP: (!) 156/92  Pulse: 88  Weight: 191 lb 6.4 oz (86.8 kg)  Height: 5\' 10"  (1.778 m)   Wt Readings from Last 3 Encounters:  08/08/16 191 lb 6.4 oz (86.8 kg)  07/05/16 192 lb 8 oz (87.3 kg)  06/26/16 199 lb 2 oz (90.3 kg)    Labs: Lab Results  Component Value Date   NA 140 06/26/2016   K 4.2 06/26/2016   CL 98 06/26/2016   CO2 24 06/26/2016   GLUCOSE 94 06/26/2016   BUN 14 06/26/2016   CREATININE 0.85 06/26/2016   CALCIUM 9.8 06/26/2016   MG 1.9 01/24/2016   No results found for: INR Lab Results  Component Value Date   CHOL 177 03/11/2016   HDL 51 03/11/2016   LDLCALC 106 (H) 03/11/2016   TRIG 100 03/11/2016     GEN- The patient is well appearing, alert and oriented x 3 today.   Head- normocephalic, atraumatic Eyes-  Sclera clear, conjunctiva pink Ears- hearing intact Oropharynx- clear Neck- supple, no JVP Lymph- no cervical lymphadenopathy Lungs- Clear to ausculation bilaterally, normal work of breathing Heart- Regular rate and rhythm, no murmurs, rubs or gallops, PMI not laterally displaced GI- soft, NT, ND, + BS Extremities- no clubbing,  cyanosis, or edema MS- no significant deformity or atrophy Skin- no rash or lesion Psych- euthymic mood, full affect Neuro- strength and sensation are intact  EKG-NSR at 88 bpm, pr int 172 ms, qrs itn 74 ms, qtc 464 ms Epic [name] reviewed    Assessment and Plan: 1. Afib Doing well s/p 3rd ablation Continue tikosyn Continue xarelto without missed doses bmet/mag  f/u with Dr. Rayann [name] 6/11  Jorge [name]. Jorge [name], [facility] [address] [place], [place] [address] [phone]

## 2016-08-21 ENCOUNTER — Other Ambulatory Visit (HOSPITAL_COMMUNITY): Payer: BC Managed Care – PPO | Admitting: Nurse Practitioner

## 2016-08-21 ENCOUNTER — Ambulatory Visit (HOSPITAL_COMMUNITY)
Admission: RE | Admit: 2016-08-21 | Discharge: 2016-08-21 | Disposition: A | Discharge status: Home / Self Care | Payer: BC Managed Care – PPO | Source: Ambulatory Visit | Attending: Nurse Practitioner | Admitting: Nurse Practitioner

## 2016-08-21 ENCOUNTER — Encounter (INDEPENDENT_AMBULATORY_CARE_PROVIDER_SITE_OTHER): Payer: Self-pay

## 2016-08-21 DIAGNOSIS — I481 Persistent atrial fibrillation: Secondary | ICD-10-CM | POA: Insufficient documentation

## 2016-08-21 LAB — BASIC METABOLIC PANEL
Anion gap: 9 (ref 5–15)
BUN: 14 mg/dL (ref 6–20)
CALCIUM: 9.6 mg/dL (ref 8.9–10.3)
CO2: 26 mmol/L (ref 22–32)
CREATININE: 0.86 mg/dL (ref 0.61–1.24)
Chloride: 102 mmol/L (ref 101–111)
Glucose, Bld: 97 mg/dL (ref 65–99)
Potassium: 4.3 mmol/L (ref 3.5–5.1)
SODIUM: 137 mmol/L (ref 135–145)

## 2016-08-22 ENCOUNTER — Other Ambulatory Visit (HOSPITAL_COMMUNITY): Payer: BC Managed Care – PPO | Admitting: Nurse Practitioner

## 2016-09-05 ENCOUNTER — Ambulatory Visit (INDEPENDENT_AMBULATORY_CARE_PROVIDER_SITE_OTHER): Payer: BC Managed Care – PPO | Admitting: Family Medicine

## 2016-09-05 ENCOUNTER — Encounter: Payer: Self-pay | Admitting: Family Medicine

## 2016-09-05 VITALS — BP 134/72 | HR 86 | Temp 98.0°F | Resp 16 | Wt 193.0 lb

## 2016-09-05 DIAGNOSIS — R109 Unspecified abdominal pain: Secondary | ICD-10-CM | POA: Diagnosis not present

## 2016-09-05 DIAGNOSIS — N200 Calculus of kidney: Secondary | ICD-10-CM

## 2016-09-05 LAB — POCT URINALYSIS DIPSTICK
Bilirubin, UA: NEGATIVE
Glucose, UA: NEGATIVE
Ketones, UA: NEGATIVE
LEUKOCYTES UA: NEGATIVE
Nitrite, UA: NEGATIVE
PH UA: 6.5 (ref 5.0–8.0)
PROTEIN UA: NEGATIVE
SPEC GRAV UA: 1.01 (ref 1.010–1.025)
UROBILINOGEN UA: 0.2 U/dL

## 2016-09-05 MED ORDER — TAMSULOSIN HCL 0.4 MG PO CAPS: 0.4000 mg | ORAL_CAPSULE | Freq: Every day | ORAL | 5 refills | Status: DC

## 2016-09-05 MED ORDER — TRAMADOL HCL 50 MG PO TABS: 50.0000 mg | ORAL_TABLET | Freq: Three times a day (TID) | ORAL | 5 refills | Status: DC | PRN

## 2016-09-05 NOTE — Progress Notes (Signed)
Subjective:  HPI Pt is here today for left side pain, left flank pain and testicular pain. He reports that he thinks it is a kidney stone because he has had them several times in the past. He started having pain and urinating blood 6 days ago and has been on and off since. He has not had any today and the pain is better. He would like a refill on Flomax and Tramadol. He could not see his urologist for a while and told to come to his PCP if he needed his medications.   Prior to Admission medications   Medication Sig Start Date End Date Taking? Authorizing Provider  atorvastatin (LIPITOR) 10 MG tablet TAKE 1 TABLET (10 MG TOTAL) BY MOUTH EVERY EVENING. 03/14/16   Jerrol Banana., MD  cetirizine (ZYRTEC) 10 MG tablet TAKE 1 TABLET (10 MG TOTAL) BY MOUTH DAILY AS NEEDED FOR ALLERGIES. 04/14/16   Jerrol Banana., MD  diltiazem (CARDIZEM CD) 180 MG 24 hr capsule TAKE 1 CAPSULE (180 MG TOTAL) BY MOUTH DAILY. Patient taking differently: TAKE 1 CAPSULE (180 MG TOTAL) BY MOUTH WITH SUPPER. 02/12/16   Allred, Jeneen Rinks, MD  diltiazem (CARDIZEM) 30 MG tablet TAKE 1-2 TABLETS BY MOUTH EVERY 6 HOURS AS NEEDED FOR PALPITAIONS 10/10/15   Allred, Jeneen Rinks, MD  pantoprazole (PROTONIX) 40 MG tablet Take 1 tablet (40 mg total) by mouth daily. 07/05/16   Allred, Jeneen Rinks, MD  potassium chloride SA (K-DUR,KLOR-CON) 20 MEQ tablet Take 1 tablet (20 mEq total) by mouth daily. 08/08/16   Sherran Needs, NP  TIKOSYN 500 MCG capsule TAKE 1 CAPSULE (500 MCG TOTAL) BY MOUTH 2 (TWO) TIMES DAILY. 06/24/16   Allred, Jeneen Rinks, MD  XARELTO 20 MG TABS tablet TAKE 1 TABLET BY MOUTH DAILY WITH SUPPER 11/16/15   Thompson Grayer, MD    Patient Active Problem List   Diagnosis Date Noted  . A-fib ([place]) 07/04/2016  . Recurrent left inguinal hernia 03/23/2015  . Allergic rhinitis 03/08/2015  . Personal history of surgery to heart and great vessels, presenting hazards to health 03/08/2015  . HLD (hyperlipidemia) 03/08/2015  . Atrial  fibrillation with rapid ventricular response ([place]) 07/04/2014  . Chronic anticoagulation - Xarelto   . Paroxysmal atrial fibrillation ([place]) 03/29/2014  . Essential hypertension 12/15/2013  . Atrial fibrillation ([place]) 06/13/2013    Past Medical History:  Diagnosis Date  . Atrial fibrillation ([place])    first diagnosed 2011; s/p DCCV at that time; ablation 10-2013 & 03/2014; failed multaq, flecainide and amiodarone  . Chronic anticoagulation    Xarelto  . Chronic kidney disease    H/O KIDNEY STONES  . Dysrhythmia   . Headache   . Hyperlipidemia   . Hypertension   . Sinusitis     Social History   Social History  . Marital status: Married    Spouse name: N/A  . Number of children: N/A  . Years of education: N/A   Occupational History  . [employer] Server w/ [employer]    Social History Main Topics  . Smoking status: Never Smoker  . Smokeless tobacco: Never Used  . Alcohol use 1.2 oz/week    2 Cans of beer per week     Comment: occasionally  . Drug use: No  . Sexual activity: Yes   Other Topics Concern  . Not on file   Social History Narrative   Lives in [place] with his spouse.  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 Oxycodone Nausea And Vomiting  . Vicodin [Hydrocodone-Acetaminophen] Nausea And Vomiting    Can take regular Tylenol    Review of Systems  Constitutional: Negative.   HENT: Positive for congestion.   Eyes: Negative.   Respiratory: Negative.   Cardiovascular: Negative.   Gastrointestinal: Positive for abdominal pain.  Genitourinary: Positive for dysuria, flank pain and hematuria.  Skin: Negative.   Neurological: Negative.   Endo/Heme/Allergies: Negative.   Psychiatric/Behavioral: Negative.     Immunization History  Administered Date(s) Administered  . Influenza,inj,Quad PF,36+ Mos 03/13/2016  . Tdap 02/09/2013    Objective:  BP 134/72 (BP Location: Left Arm, Patient Position: Sitting,  Cuff Size: Large)   Pulse 86   Temp 98 F (36.7 C) (Oral)   Resp 16   Wt 193 lb (87.5 kg)   BMI 27.69 kg/m   Physical Exam  Constitutional: He is oriented to person, place, and time and well-developed, well-nourished, and in no distress.  HENT:  Head: Normocephalic and atraumatic.  Eyes: Conjunctivae and EOM are normal. Pupils are equal, round, and reactive to light.  Neck: Normal range of motion. Neck supple.  Cardiovascular: Normal rate, regular rhythm, normal heart sounds and intact distal pulses.   Pulmonary/Chest: Effort normal and breath sounds normal.  Neurological: He is alert and oriented to person, place, and time. He has normal reflexes. Gait normal. GCS score is 15.  Skin: Skin is warm and dry.  Psychiatric: Mood, memory, affect and judgment normal.    Lab Results  Component Value Date   WBC 8.0 06/26/2016   HGB 13.3 03/21/2014   HCT 42.5 06/26/2016   PLT 251 06/26/2016   GLUCOSE 97 08/21/2016   CHOL 177 03/11/2016   TRIG 100 03/11/2016   HDL 51 03/11/2016   LDLCALC 106 (H) 03/11/2016   TSH 1.390 03/11/2016    CMP     Component Value Date/Time   NA 137 08/21/2016 0830   NA 140 06/26/2016 1341   K 4.3 08/21/2016 0830   CL 102 08/21/2016 0830   CO2 26 08/21/2016 0830   GLUCOSE 97 08/21/2016 0830   BUN 14 08/21/2016 0830   BUN 14 06/26/2016 1341   CREATININE 0.86 08/21/2016 0830   CREATININE 0.93 01/24/2016 1640   CALCIUM 9.6 08/21/2016 0830   PROT 6.8 03/11/2016 0735   ALBUMIN 4.5 03/11/2016 0735   AST 17 03/11/2016 0735   ALT 20 03/11/2016 0735   ALKPHOS 64 03/11/2016 0735   BILITOT 0.6 03/11/2016 0735   GFRNONAA >60 08/21/2016 0830   GFRAA >60 08/21/2016 0830    Assessment and Plan :  1. Flank pain  - POCT urinalysis dipstick  2. Kidney stone  - traMADol (ULTRAM) 50 MG tablet; Take 1 tablet (50 mg total) by mouth every 8 (eight) hours as needed.  Dispense: 100 tablet; Refill: 5 - tamsulosin (FLOMAX) 0.4 MG CAPS capsule; Take 1 capsule  (0.4 mg total) by mouth daily.  Dispense: 30 capsule; Refill: 5 3.AFib Controlled. HPI, Exam, and A&P Transcribed under the direction and in the presence of Richard L. Cranford Mon, MD  Electronically Signed: Katina Dung, Muniz MD [facility] 09/05/2016 3:46 PM

## 2016-09-09 ENCOUNTER — Telehealth: Payer: Self-pay | Admitting: Family Medicine

## 2016-09-09 NOTE — Telephone Encounter (Signed)
Jorge [name] with [facility] is requesting a call back from [place].  This is about the prior auth.  CB#[phone]/[id number]

## 2016-09-09 NOTE — Telephone Encounter (Signed)
Spoke with Mickel Baas. Insurance only pays for 7 days worth and patient received that amount. For more quantity have to do PA. Spoke with Dr Rosanna Randy and he said not to proceed with PA and just leave it at 7 days worth

## 2016-09-11 ENCOUNTER — Telehealth: Payer: Self-pay | Admitting: Family Medicine

## 2016-09-11 DIAGNOSIS — N200 Calculus of kidney: Secondary | ICD-10-CM

## 2016-09-11 NOTE — Telephone Encounter (Signed)
Referral ordered and pt advised. Renaldo Fiddler, CMA

## 2016-09-11 NOTE — Telephone Encounter (Signed)
Pt was in last week with a kidney stone.  He told Dr. Rosanna Randy he was going to go to Dr. Jacqlyn Larsen.  He has changed his mind.  He would like a referral to [facility].  Please advise  [phone]  Thanks Con Memos

## 2016-09-11 NOTE — Telephone Encounter (Signed)
Ok to refer.

## 2016-09-11 NOTE — Telephone Encounter (Signed)
ok 

## 2016-09-14 ENCOUNTER — Other Ambulatory Visit: Payer: Self-pay | Admitting: Internal Medicine

## 2016-09-18 ENCOUNTER — Ambulatory Visit
Admission: RE | Admit: 2016-09-18 | Discharge: 2016-09-18 | Disposition: A | Discharge status: Home / Self Care | Payer: BC Managed Care – PPO | Source: Ambulatory Visit | Attending: Urology | Admitting: Urology

## 2016-09-18 ENCOUNTER — Ambulatory Visit (INDEPENDENT_AMBULATORY_CARE_PROVIDER_SITE_OTHER): Payer: BC Managed Care – PPO | Admitting: Urology

## 2016-09-18 ENCOUNTER — Encounter: Payer: Self-pay | Admitting: Urology

## 2016-09-18 VITALS — BP 146/88 | HR 98 | Ht 70.0 in | Wt 192.0 lb

## 2016-09-18 DIAGNOSIS — N200 Calculus of kidney: Secondary | ICD-10-CM | POA: Diagnosis not present

## 2016-09-18 DIAGNOSIS — R195 Other fecal abnormalities: Secondary | ICD-10-CM | POA: Insufficient documentation

## 2016-09-18 LAB — URINALYSIS, COMPLETE
BILIRUBIN UA: NEGATIVE
Glucose, UA: NEGATIVE
Ketones, UA: NEGATIVE
Leukocytes, UA: NEGATIVE
NITRITE UA: NEGATIVE
PH UA: 6.5 (ref 5.0–7.5)
Protein, UA: NEGATIVE
RBC UA: NEGATIVE
SPEC GRAV UA: 1.01 (ref 1.005–1.030)
UUROB: 0.2 mg/dL (ref 0.2–1.0)

## 2016-09-18 NOTE — Progress Notes (Signed)
09/18/2016 10:54 AM   Jorge [name] 08-21-1965 [id number]  Referring provider: Jerrol Banana., MD [address] [place], [place] [address]  Chief Complaint  Patient presents with  . New Patient (Initial Visit)    kidney stone referred by Dr. Rosanna Randy    HPI: New patient referred for flank pain. Noted left flank pain over the past few weeks which radiated down into left testicle. Sharp pain. Severe at times. Started tamsulosin. Also tramadol. Passed a small stone. No other aggravating or alleviating factors. No associated signs or symptoms. Occasional frequency, nocturia and intermittent stream. Pain improved.  UA today normal.   H/o stones s/p lithotripsy 2005 and 2013 with Dr. Olena Heckle and Dr. Jacqlyn Larsen. He gets yearly PSA and DRE. PSA was 1.04 Mar 2016.   He is on Xarelto for A Fib.   PMH: Past Medical History:  Diagnosis Date  . Atrial fibrillation ([place])    first diagnosed 2011; s/p DCCV at that time; ablation 10-2013 & 03/2014; failed multaq, flecainide and amiodarone  . Chronic anticoagulation    Xarelto  . Chronic kidney disease    H/O KIDNEY STONES  . Dysrhythmia   . Headache   . Hyperlipidemia   . Hypertension   . Sinusitis     Surgical History: Past Surgical History:  Procedure Laterality Date  . APPENDECTOMY  1975  . ATRIAL FIBRILLATION ABLATION N/A 11/02/2013   PVI by Dr Rayann Heman  . ATRIAL FIBRILLATION ABLATION N/A 03/29/2014   PVI and CTI by Dr Rayann Heman  . ATRIAL FIBRILLATION ABLATION N/A 07/04/2016   Procedure: Atrial Fibrillation Ablation;  Surgeon: Thompson Grayer, MD;  Location: [place] [facility];  Service: Cardiovascular;  Laterality: N/A;  . CARDIOVERSION     Hx of  . CARDIOVERSION N/A 10/15/2013   Procedure: CARDIOVERSION;  Surgeon: Fay Records, MD;  Location: [place];  Service: Cardiovascular;  Laterality: N/A;  . COLONOSCOPY  2016  . HERNIA REPAIR Left 05/28/2013   left inguinal henria repair, robotic direct defect repaired with  Bard ultralight mesh.  Marland Kitchen HERNIA REPAIR Left 05/31/2015   Large Atrium pro loop mesh, direct defect.   . INGUINAL HERNIA REPAIR Left 05/31/2015   Procedure: HERNIA REPAIR INGUINAL ADULT;  Surgeon: Robert Bellow, MD;  Location: [facility];  Service: General;  Laterality: Left;  . KNEE SURGERY  2009   [facility]  . LITHOTRIPSY     x 3  last one  2013  . RHINOPLASTY  2006   Dr. Nadeen Landau, [facility]  . TEE WITHOUT CARDIOVERSION N/A 11/01/2013   Procedure: TRANSESOPHAGEAL ECHOCARDIOGRAM (TEE);  Surgeon: Candee Furbish, MD;  Location: [facility] ENDOSCOPY;  Service: Cardiovascular;  Laterality: N/A;  . TEE WITHOUT CARDIOVERSION N/A 03/28/2014   Procedure: TRANSESOPHAGEAL ECHOCARDIOGRAM (TEE);  Surgeon: Larey Dresser, MD;  Location: [place];  Service: Cardiovascular;  Laterality: N/A;    Home Medications:  Allergies as of 09/18/2016      Reactions   Oxycodone Nausea And Vomiting   Vicodin [hydrocodone-acetaminophen] Nausea And Vomiting   Can take regular Tylenol      Medication List       Accurate as of 09/18/16 10:54 AM. Always use your most recent med list.          atorvastatin 10 MG tablet Commonly known as:  LIPITOR TAKE 1 TABLET (10 MG TOTAL) BY MOUTH EVERY EVENING.   cetirizine 10 MG tablet Commonly known as:  ZYRTEC TAKE 1 TABLET (10 MG TOTAL)  BY MOUTH DAILY AS NEEDED FOR ALLERGIES.   diltiazem 180 MG 24 hr capsule Commonly known as:  CARDIZEM CD TAKE 1 CAPSULE (180 MG TOTAL) BY MOUTH DAILY.   diltiazem 30 MG tablet Commonly known as:  CARDIZEM TAKE 1-2 TABLETS BY MOUTH EVERY 6 HOURS AS NEEDED FOR PALPITAIONS   pantoprazole 40 MG tablet Commonly known as:  PROTONIX Take 1 tablet (40 mg total) by mouth daily.   potassium chloride SA 20 MEQ tablet Commonly known as:  K-DUR,KLOR-CON Take 1 tablet (20 mEq total) by mouth daily.   tamsulosin 0.4 MG Caps capsule Commonly known as:  FLOMAX Take 1 capsule (0.4 mg total) by mouth daily.   TIKOSYN 500 MCG  capsule Generic drug:  dofetilide TAKE 1 CAPSULE (500 MCG TOTAL) BY MOUTH 2 (TWO) TIMES DAILY.   traMADol 50 MG tablet Commonly known as:  ULTRAM Take 1 tablet (50 mg total) by mouth every 8 (eight) hours as needed.   XARELTO 20 MG Tabs tablet Generic drug:  rivaroxaban TAKE 1 TABLET BY MOUTH DAILY WITH SUPPER       Allergies:  Allergies  Allergen Reactions  . Oxycodone Nausea And Vomiting  . Vicodin [Hydrocodone-Acetaminophen] Nausea And Vomiting    Can take regular Tylenol    Family History: Family History  Problem Relation Age of Onset  . Cancer Mother        breast, cause of death  . Heart attack Maternal Grandfather 53       Cause of death  . Hypertension Maternal Grandfather   . Heart failure Brother   . CVA Paternal Grandmother        cause of death  . Atrial fibrillation Maternal Grandmother   . Colon polyps Unknown        Fam Hx  . Kidney cancer Neg Hx   . Bladder Cancer Neg Hx   . Prostate cancer Neg Hx     Social History:  reports that he has never smoked. He has never used smokeless tobacco. He reports that he drinks about 1.2 oz of alcohol per week . He reports that he does not use drugs.  ROS: UROLOGY Frequent Urination?: Yes Hard to postpone urination?: No Burning/pain with urination?: No Get up at night to urinate?: Yes Leakage of urine?: No Urine stream starts and stops?: Yes Trouble starting stream?: No Do you have to strain to urinate?: No Blood in urine?: Yes Urinary tract infection?: No Sexually transmitted disease?: No Injury to kidneys or bladder?: No Painful intercourse?: No Weak stream?: No Erection problems?: No Penile pain?: No  Gastrointestinal Nausea?: No Vomiting?: No Indigestion/heartburn?: No Diarrhea?: No Constipation?: No  Constitutional Fever: No Night sweats?: No Weight loss?: No Fatigue?: No  Skin Skin rash/lesions?: No Itching?: No  Eyes Blurred vision?: No Double vision?:  No  Ears/Nose/Throat Sore throat?: No Sinus problems?: No  Hematologic/Lymphatic Swollen glands?: No Easy bruising?: No  Cardiovascular Leg swelling?: No Chest pain?: No  Respiratory Cough?: No Shortness of breath?: No  Endocrine Excessive thirst?: No  Musculoskeletal Back pain?: No Joint pain?: No  Neurological Headaches?: No Dizziness?: No  Psychologic Depression?: No Anxiety?: No  Physical Exam: BP (!) 146/88   Pulse 98   Ht 5\' 10"  (1.778 m)   Wt 87.1 kg (192 lb)   BMI 27.55 kg/m   Constitutional:  Alert and oriented, No acute distress. HEENT: [place] AT, moist mucus membranes.  Trachea midline, no masses. Cardiovascular: No clubbing, cyanosis, or edema. Respiratory: Normal respiratory effort, no increased  work of breathing. GI: Abdomen is soft, nontender, nondistended, no abdominal masses GU: No CVA tenderness.  Skin: No rashes, bruises or suspicious lesions. Lymph: No cervical or inguinal adenopathy. Neurologic: Grossly intact, no focal deficits, moving all 4 extremities. Psychiatric: Normal mood and affect.  Laboratory Data: Lab Results  Component Value Date   WBC 8.0 06/26/2016   HGB 13.3 03/21/2014   HCT 42.5 06/26/2016   MCV 88 06/26/2016   PLT 251 06/26/2016    Lab Results  Component Value Date   CREATININE 0.86 08/21/2016    No results found for: PSA  No results found for: TESTOSTERONE  No results found for: HGBA1C  Urinalysis    Component Value Date/Time   BILIRUBINUR neg 09/05/2016 1556   PROTEINUR neg 09/05/2016 1556   UROBILINOGEN 0.2 09/05/2016 1556   NITRITE neg 09/05/2016 1556   LEUKOCYTESUR Negative 09/05/2016 1556     Assessment & Plan:    1. Kidney stones -attack of pain and red urine and then saw a small stone pass. Pain improved and UA clear. Will follow up with a renal US and KUB for now.  - Urinalysis, Complete   No Follow-up on file.  Festus Aloe, [facility] [address], [place], [place] [address] [phone]

## 2016-09-25 ENCOUNTER — Telehealth: Payer: Self-pay

## 2016-09-25 NOTE — Telephone Encounter (Signed)
Festus Aloe, MD  Lestine Box, LPN        Notify patient he had very small 2-3 mm stones in both kidneys on his KUB. No large stones.   Spoke with pt in reference to KUB results. Pt voiced understanding.

## 2016-09-30 NOTE — Anesthesia Postprocedure Evaluation (Signed)
Anesthesia Post Note  Patient: Jorge [name]  Procedure(s) Performed: Procedure(s) (LRB): Atrial Fibrillation Ablation (N/A)     Anesthesia Post Evaluation  Last Vitals:  Vitals:   07/04/16 2138 07/05/16 0356  BP: 121/77 126/82  Pulse: 74 78  Resp: 10 17  Temp: 37.2 C 36.9 C    Last Pain:  Vitals:   07/05/16 0356  TempSrc: Oral  PainSc:                  Devery Odwyer S

## 2016-09-30 NOTE — Addendum Note (Signed)
Addendum  created 09/30/16 1212 by Myrtie Soman, MD   Sign clinical note

## 2016-10-01 ENCOUNTER — Ambulatory Visit: Payer: BC Managed Care – PPO | Admitting: Urology

## 2016-10-02 ENCOUNTER — Ambulatory Visit
Admission: RE | Admit: 2016-10-02 | Discharge: 2016-10-02 | Disposition: A | Discharge status: Home / Self Care | Payer: BC Managed Care – PPO | Source: Ambulatory Visit | Attending: Urology | Admitting: Urology

## 2016-10-02 DIAGNOSIS — N200 Calculus of kidney: Secondary | ICD-10-CM | POA: Diagnosis not present

## 2016-10-02 DIAGNOSIS — N281 Cyst of kidney, acquired: Secondary | ICD-10-CM | POA: Insufficient documentation

## 2016-10-07 ENCOUNTER — Ambulatory Visit: Payer: BC Managed Care – PPO | Admitting: Internal Medicine

## 2016-10-08 ENCOUNTER — Ambulatory Visit: Payer: BC Managed Care – PPO

## 2016-10-08 ENCOUNTER — Telehealth: Payer: Self-pay

## 2016-10-08 NOTE — Telephone Encounter (Signed)
-----   Message from Festus Aloe, MD sent at 10/08/2016  1:24 PM EDT ----- Notify patient his renal US showed nothing worrisome. It confirms some small stones in the kidneys but no blockage of the kidneys.   ----- Message ----- From: Royanne Foots, CMA Sent: 10/03/2016   8:32 AM To: Festus Aloe, MD    ----- Message ----- From: Interface, Rad Results In Sent: 10/02/2016   5:38 PM To: Rowe Robert Clinical

## 2016-10-08 NOTE — Telephone Encounter (Signed)
Left pt mess to call/SW 

## 2016-10-09 NOTE — Telephone Encounter (Signed)
Patient notified/SW 

## 2016-10-10 ENCOUNTER — Encounter: Payer: Self-pay | Admitting: Internal Medicine

## 2016-10-10 ENCOUNTER — Ambulatory Visit (INDEPENDENT_AMBULATORY_CARE_PROVIDER_SITE_OTHER): Payer: BC Managed Care – PPO | Admitting: Internal Medicine

## 2016-10-10 VITALS — BP 138/90 | HR 90 | Ht 70.0 in | Wt 191.0 lb

## 2016-10-10 DIAGNOSIS — I480 Paroxysmal atrial fibrillation: Secondary | ICD-10-CM | POA: Diagnosis not present

## 2016-10-10 DIAGNOSIS — R0602 Shortness of breath: Secondary | ICD-10-CM

## 2016-10-10 DIAGNOSIS — R0789 Other chest pain: Secondary | ICD-10-CM

## 2016-10-10 DIAGNOSIS — I10 Essential (primary) hypertension: Secondary | ICD-10-CM

## 2016-10-10 MED ORDER — ASPIRIN EC 81 MG PO TBEC: 81.0000 mg | DELAYED_RELEASE_TABLET | Freq: Every day | ORAL | 3 refills | Status: DC

## 2016-10-10 NOTE — Progress Notes (Signed)
PCP: Jerrol Banana., MD  Jorge [name] is a 51 y.o. male who presents today for routine electrophysiology followup.  Since his recent afib ablation, the patient reports doing very well.  he denies procedure related complications and is pleased with the results of the procedure.  He has noticed ongoing difficulty with SOB with activity.  He also has occasional chest tightness.  He has been much more concerned about his heart since his cardiac CT revealed 99th percentile calcium score.   Today, he denies symptoms of palpitations, lower extremity edema, dizziness, presyncope, or syncope.  The patient is otherwise without complaint today.   Past Medical History:  Diagnosis Date  . Atrial fibrillation ([place])    first diagnosed 2011; s/p DCCV at that time; ablation 10-2013 & 03/2014; failed multaq, flecainide and amiodarone  . Chronic anticoagulation    Xarelto  . Chronic kidney disease    H/O KIDNEY STONES  . Dysrhythmia   . Headache   . Hyperlipidemia   . Hypertension   . Sinusitis    Past Surgical History:  Procedure Laterality Date  . APPENDECTOMY  1975  . ATRIAL FIBRILLATION ABLATION N/A 11/02/2013   PVI by Dr Rayann Heman  . ATRIAL FIBRILLATION ABLATION N/A 03/29/2014   PVI and CTI by Dr Rayann Heman  . ATRIAL FIBRILLATION ABLATION N/A 07/04/2016   Procedure: Atrial Fibrillation Ablation;  Surgeon: Thompson Grayer, MD;  Location: [place] [facility];  Service: Cardiovascular;  Laterality: N/A;  . CARDIOVERSION     Hx of  . CARDIOVERSION N/A 10/15/2013   Procedure: CARDIOVERSION;  Surgeon: Fay Records, MD;  Location: [place];  Service: Cardiovascular;  Laterality: N/A;  . COLONOSCOPY  2016  . HERNIA REPAIR Left 05/28/2013   left inguinal henria repair, robotic direct defect repaired with Bard ultralight mesh.  Marland Kitchen HERNIA REPAIR Left 05/31/2015   Large Atrium pro loop mesh, direct defect.   . INGUINAL HERNIA REPAIR Left 05/31/2015   Procedure: HERNIA REPAIR INGUINAL ADULT;  Surgeon:  Robert Bellow, MD;  Location: [facility] ORS;  Service: General;  Laterality: Left;  . KNEE SURGERY  2009   [facility]  . LITHOTRIPSY     x 3  last one  2013  . RHINOPLASTY  2006   Dr. Nadeen Landau, [facility]  . TEE WITHOUT CARDIOVERSION N/A 11/01/2013   Procedure: TRANSESOPHAGEAL ECHOCARDIOGRAM (TEE);  Surgeon: Candee Furbish, MD;  Location: [facility] ENDOSCOPY;  Service: Cardiovascular;  Laterality: N/A;  . TEE WITHOUT CARDIOVERSION N/A 03/28/2014   Procedure: TRANSESOPHAGEAL ECHOCARDIOGRAM (TEE);  Surgeon: Larey Dresser, MD;  Location: [place];  Service: Cardiovascular;  Laterality: N/A;    ROS- all systems are personally reviewed and negatives except as per HPI above  Current Outpatient Prescriptions  Medication Sig Dispense Refill  . atorvastatin (LIPITOR) 10 MG tablet TAKE 1 TABLET (10 MG TOTAL) BY MOUTH EVERY EVENING. 90 tablet 3  . cetirizine (ZYRTEC) 10 MG tablet TAKE 1 TABLET (10 MG TOTAL) BY MOUTH DAILY AS NEEDED FOR ALLERGIES. 30 tablet 11  . diltiazem (CARDIZEM CD) 180 MG 24 hr capsule TAKE 1 CAPSULE (180 MG TOTAL) BY MOUTH DAILY. (Patient taking differently: TAKE 1 CAPSULE (180 MG TOTAL) BY MOUTH WITH SUPPER.) 90 capsule 3  . diltiazem (CARDIZEM) 30 MG tablet TAKE 1-2 TABLETS BY MOUTH EVERY 6 HOURS AS NEEDED FOR PALPITAIONS 30 tablet 6  . potassium chloride SA (K-DUR,KLOR-CON) 20 MEQ tablet Take 1 tablet (20 mEq total) by mouth daily. 30 tablet 3  .  tamsulosin (FLOMAX) 0.4 MG CAPS capsule Take 1 capsule (0.4 mg total) by mouth daily. 30 capsule 5  . TIKOSYN 500 MCG capsule TAKE 1 CAPSULE (500 MCG TOTAL) BY MOUTH 2 (TWO) TIMES DAILY. 180 capsule 2  . traMADol (ULTRAM) 50 MG tablet Take 1 tablet (50 mg total) by mouth every 8 (eight) hours as needed. 100 tablet 5  . XARELTO 20 MG TABS tablet TAKE 1 TABLET BY MOUTH DAILY WITH SUPPER 30 tablet 11   No current facility-administered medications for this visit.     Physical Exam: Vitals:   10/10/16 0830  BP: 138/90    Pulse: 90  SpO2: 97%  Weight: 191 lb (86.6 kg)  Height: 5\' 10"  (1.778 m)    GEN- The patient is well appearing, alert and oriented x 3 today.   Head- normocephalic, atraumatic Eyes-  Sclera clear, conjunctiva pink Ears- hearing intact Oropharynx- clear Lungs- Clear to ausculation bilaterally, normal work of breathing Heart- Regular rate and rhythm, no murmurs, rubs or gallops, PMI not laterally displaced GI- soft, NT, ND, + BS Extremities- no clubbing, cyanosis, or edema  EKG tracing ordered today is personally reviewed and shows sinus rhythm, Qtc 486 msec, nonspecific ST/T changes  Assessment and Plan:  1. Persistent/ paroxysmal atrial fibrillation Doing well s/p ablation chads2vasc score is 1 Stop xarelto now that he is 3 months post ablation  2. HTN Stable No change required today  3. SOB, chest discomfort, calcium on cardiac CT He is very worried about risks of CAD Given symptoms and abnormal baseline ekg, will proceed with exercise myoview.  If low risk, will manage medically.  If moderate or high risk will consider cath.  Return to see me in 6 weeks  Thompson Grayer MD, [facility] 10/10/2016 8:38 AM

## 2016-10-10 NOTE — Patient Instructions (Signed)
Medication Instructions:  Your physician has recommended you make the following change in your medication:  1) Stop Xarelto  2) Start Aspirin 81 mg daily   Labwork: None ordered   Testing/Procedures: Your physician has requested that you have en exercise stress myoview. For further information please visit [email or web address]. Please follow instruction sheet, as given.    Follow-Up: Your physician recommends that you schedule a follow-up appointment in: 6 weeks with Dr Rayann Heman   Any Other Special Instructions Will Be Listed Below (If Applicable).     If you need a refill on your cardiac medications before your next appointment, please call your pharmacy.

## 2016-10-16 ENCOUNTER — Telehealth (HOSPITAL_COMMUNITY): Payer: Self-pay | Admitting: *Deleted

## 2016-10-16 NOTE — Telephone Encounter (Signed)
Left message on voicemail per DPR in reference to upcoming appointment scheduled on  10/18/16 with detailed instructions given per Myocardial Perfusion Study Information Sheet for the test. LM to arrive 15 minutes early, and that it is imperative to arrive on time for appointment to keep from having the test rescheduled. If you need to cancel or reschedule your appointment, please call the office within 24 hours of your appointment. Failure to do so may result in a cancellation of your appointment, and a $50 no show fee. Phone number given for call back for any questions. Kirstie Peri

## 2016-10-18 ENCOUNTER — Ambulatory Visit (HOSPITAL_COMMUNITY): Payer: BC Managed Care – PPO | Attending: Cardiovascular Disease

## 2016-10-18 DIAGNOSIS — R0789 Other chest pain: Secondary | ICD-10-CM | POA: Insufficient documentation

## 2016-10-18 DIAGNOSIS — R0602 Shortness of breath: Secondary | ICD-10-CM | POA: Diagnosis not present

## 2016-10-18 LAB — MYOCARDIAL PERFUSION IMAGING
CHL CUP NUCLEAR SDS: 3
CHL CUP NUCLEAR SSS: 4
LHR: 0.29
NUC STRESS TID: 1.18
Peak HR: 150 {beats}/min
Rest HR: 140 {beats}/min
SRS: 1

## 2016-10-18 MED ORDER — TECHNETIUM TC 99M TETROFOSMIN IV KIT
10.3000 | PACK | Freq: Once | INTRAVENOUS | Status: AC | PRN
  Administered 2016-10-18: 10.3 via INTRAVENOUS
  Filled 2016-10-18: qty 11

## 2016-10-18 MED ORDER — REGADENOSON 0.4 MG/5ML IV SOLN
0.4000 mg | Freq: Once | INTRAVENOUS | Status: AC
  Administered 2016-10-18: 0.4 mg via INTRAVENOUS

## 2016-10-18 MED ORDER — TECHNETIUM TC 99M TETROFOSMIN IV KIT
32.2000 | PACK | Freq: Once | INTRAVENOUS | Status: AC | PRN
  Administered 2016-10-18: 32.2 via INTRAVENOUS
  Filled 2016-10-18: qty 33

## 2016-11-18 ENCOUNTER — Ambulatory Visit (INDEPENDENT_AMBULATORY_CARE_PROVIDER_SITE_OTHER): Payer: BC Managed Care – PPO | Admitting: Family Medicine

## 2016-11-18 VITALS — BP 126/80 | HR 100 | Temp 98.2°F | Resp 16 | Wt 192.2 lb

## 2016-11-18 DIAGNOSIS — J0181 Other acute recurrent sinusitis: Secondary | ICD-10-CM | POA: Diagnosis not present

## 2016-11-18 DIAGNOSIS — J301 Allergic rhinitis due to pollen: Secondary | ICD-10-CM | POA: Diagnosis not present

## 2016-11-18 DIAGNOSIS — I4891 Unspecified atrial fibrillation: Secondary | ICD-10-CM

## 2016-11-18 DIAGNOSIS — I10 Essential (primary) hypertension: Secondary | ICD-10-CM | POA: Diagnosis not present

## 2016-11-18 MED ORDER — AMOXICILLIN-POT CLAVULANATE 875-125 MG PO TABS: 1.0000 | ORAL_TABLET | Freq: Two times a day (BID) | ORAL | 1 refills | Status: DC

## 2016-11-18 NOTE — Progress Notes (Signed)
Jorge [name]  MRN: [id number] DOB: 1966-02-21  Subjective:  HPI  Patient feels like he has a sinus infection. Patient developed headache and pressure behind right eye around first week of July, he did not have any drainage and had some congestion. He was scheduled to have dental surgery at that time and was put on Doxycycline 1 tablet daily for 10 days. After dental procedure and taking Doxy he started to have drainage with green color and thick, patient also started to take Mucinex then too. He finished antibiotic course and headache resolved and drainage improved. Then a week ago today he started to have a headache and pressure again behind right eye and this has gotten worse. Not much drainage, some congestion present. No fever. And has been taking Mucinex again. Patient states he had the same thing happened in 2012 and Dr. Pryor [name] had to treat him with 2 rounds of antibiotic and he wanted to discuss this today with the doctor.  Patient Active Problem List   Diagnosis Date Noted  . A-fib ([place]) 07/04/2016  . Recurrent left inguinal hernia 03/23/2015  . Allergic rhinitis 03/08/2015  . Personal history of surgery to heart and great vessels, presenting hazards to health 03/08/2015  . HLD (hyperlipidemia) 03/08/2015  . Atrial fibrillation with rapid ventricular response ([place]) 07/04/2014  . Chronic anticoagulation - Xarelto   . Paroxysmal atrial fibrillation ([place]) 03/29/2014  . Essential hypertension 12/15/2013  . Atrial fibrillation ([place]) 06/13/2013    Past Medical History:  Diagnosis Date  . Atrial fibrillation ([employer])    first diagnosed 2011; s/p DCCV at that time; ablation 10-2013 & 03/2014; failed multaq, flecainide and amiodarone  . Chronic anticoagulation    Xarelto  . Chronic kidney disease    H/O KIDNEY STONES  . Dysrhythmia   . Headache   . Hyperlipidemia   . Hypertension   . Sinusitis     Social History   Social History  . Marital status: Married    Spouse  name: N/A  . Number of children: N/A  . Years of education: N/A   Occupational History  . [employer] Server w/ [employer]    Social History Main Topics  . Smoking status: Never Smoker  . Smokeless tobacco: Never Used  . Alcohol use 1.2 oz/week    2 Cans of beer per week     Comment: occasionally  . Drug use: No  . Sexual activity: Yes   Other Topics Concern  . Not on file   Social History Narrative   Lives in [place] with his spouse.  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[employer] Prescriptions as of 11/18/2016  Medication Sig Note  . aspirin EC 81 MG tablet Take 1 tablet (81 mg total) by mouth daily.   Marland Kitchen atorvastatin (LIPITOR) 10 MG tablet TAKE 1 TABLET (10 MG TOTAL) BY MOUTH EVERY EVENING.   . cetirizine (ZYRTEC) 10 MG tablet TAKE 1 TABLET (10 MG TOTAL) BY MOUTH DAILY AS NEEDED FOR ALLERGIES.   Marland Kitchen diltiazem (CARDIZEM CD) 180 MG 24 hr capsule TAKE 1 CAPSULE (180 MG TOTAL) BY MOUTH DAILY. (Patient taking differently: TAKE 1 CAPSULE (180 MG TOTAL) BY MOUTH WITH SUPPER.)   . diltiazem (CARDIZEM) 30 MG tablet TAKE 1-2 TABLETS BY MOUTH EVERY 6 HOURS AS NEEDED FOR PALPITAIONS 09/18/2016: PRN  . TIKOSYN 500 MCG capsule TAKE 1 CAPSULE (500 MCG TOTAL) BY MOUTH 2 (TWO) TIMES DAILY.   Marland Kitchen potassium chloride SA (K-DUR,KLOR-CON)  20 MEQ tablet Take 1 tablet (20 mEq total) by mouth daily. (Patient not taking: Reported on 11/18/2016)   . tamsulosin (FLOMAX) 0.4 MG CAPS capsule Take 1 capsule (0.4 mg total) by mouth daily. (Patient not taking: Reported on 11/18/2016)   . traMADol (ULTRAM) 50 MG tablet Take 1 tablet (50 mg total) by mouth every 8 (eight) hours as needed. (Patient not taking: Reported on 11/18/2016) 09/18/2016: PRN   No facility-administered encounter medications on file as of 11/18/2016.     Allergies  Allergen Reactions  . Oxycodone Nausea And Vomiting  . Vicodin [Hydrocodone-Acetaminophen] Nausea And Vomiting    Can take regular Tylenol    Review  of Systems  Constitutional: Negative.   HENT: Positive for congestion and sinus pain.        Post nasal drip  Respiratory: Negative.   Cardiovascular: Negative.   Musculoskeletal: Negative.   Neurological: Positive for headaches.    Objective:  BP 126/80   Pulse 100   Temp 98.2 F (36.8 C)   Resp 16   Wt 192 lb 3.2 oz (87.2 kg)   SpO2 96%   BMI 27.58 kg/m   Physical Exam  Constitutional: He is oriented to person, place, and time and well-developed, well-nourished, and in no distress.  HENT:  Head: Normocephalic and atraumatic.  Right Ear: External ear normal.  Left Ear: External ear normal.  Mouth/Throat: Oropharynx is clear and moist.  Eyes: Pupils are equal, round, and reactive to light. Conjunctivae are normal.  Neck: Normal range of motion. Neck supple. No tracheal deviation present. No thyromegaly present.  Cardiovascular: Normal rate, regular rhythm, normal heart sounds and intact distal pulses.  Exam reveals no gallop.   No murmur heard. Pulmonary/Chest: Effort normal and breath sounds normal. No respiratory distress. He has no wheezes.  Musculoskeletal: He exhibits no edema or tenderness.  Neurological: He is alert and oriented to person, place, and time.  Psychiatric: Mood, memory, affect and judgment normal.   Assessment and Plan :  1. Other acute recurrent sinusitis Treat with Augmentin. Follow as needed. May see ENT. 2. Atrial fibrillation with rapid ventricular response ([place]) 3. Essential hypertension  4. Seasonal allergic rhinitis due to pollen  HPI, Exam and A&P transcribed by Theressa Millard, RMA under direction and in the presence of Miguel Aschoff, MD. I have done the exam and reviewed the chart and it is accurate to the best of my knowledge. Development worker, community has been used and  any errors in dictation or transcription are unintentional. Miguel Aschoff M.D. [facility]

## 2016-11-27 ENCOUNTER — Ambulatory Visit: Payer: BC Managed Care – PPO | Admitting: Internal Medicine

## 2016-12-02 ENCOUNTER — Ambulatory Visit (INDEPENDENT_AMBULATORY_CARE_PROVIDER_SITE_OTHER): Payer: BC Managed Care – PPO | Admitting: Internal Medicine

## 2016-12-02 ENCOUNTER — Encounter: Payer: Self-pay | Admitting: Internal Medicine

## 2016-12-02 VITALS — BP 130/80 | HR 84 | Ht 70.0 in | Wt 191.0 lb

## 2016-12-02 DIAGNOSIS — I10 Essential (primary) hypertension: Secondary | ICD-10-CM

## 2016-12-02 DIAGNOSIS — I481 Persistent atrial fibrillation: Secondary | ICD-10-CM

## 2016-12-02 DIAGNOSIS — I4819 Other persistent atrial fibrillation: Secondary | ICD-10-CM

## 2016-12-02 NOTE — Progress Notes (Signed)
PCP: Jerrol Banana., MD  Primary EP: Dr Rayann Heman  Jorge [name] is a 52 y.o. male who presents today for routine electrophysiology followup.  Since last being seen in our clinic, the patient reports doing very well.  He has had ERAF.  Over the past 2 weeks, he has had no afib and is pleased.  Today, he denies symptoms of palpitations, chest pain, shortness of breath,  lower extremity edema, dizziness, presyncope, or syncope.  The patient is otherwise without complaint today.   Past Medical History:  Diagnosis Date  . Atrial fibrillation ([place])    first diagnosed 2011; s/p DCCV at that time; ablation 10-2013 & 03/2014; failed multaq, flecainide and amiodarone  . Chronic anticoagulation    Xarelto  . Chronic kidney disease    H/O KIDNEY STONES  . Dysrhythmia   . Headache   . Hyperlipidemia   . Hypertension   . Sinusitis    Past Surgical History:  Procedure Laterality Date  . APPENDECTOMY  1975  . ATRIAL FIBRILLATION ABLATION N/A 11/02/2013   PVI by Dr Rayann Heman  . ATRIAL FIBRILLATION ABLATION N/A 03/29/2014   PVI and CTI by Dr Rayann Heman  . ATRIAL FIBRILLATION ABLATION N/A 07/04/2016   Procedure: Atrial Fibrillation Ablation;  Surgeon: Thompson Grayer, MD;  Location: [place] [facility];  Service: Cardiovascular;  Laterality: N/A;  . CARDIOVERSION     Hx of  . CARDIOVERSION N/A 10/15/2013   Procedure: CARDIOVERSION;  Surgeon: Fay Records, MD;  Location: [place];  Service: Cardiovascular;  Laterality: N/A;  . COLONOSCOPY  2016  . HERNIA REPAIR Left 05/28/2013   left inguinal henria repair, robotic direct defect repaired with Bard ultralight mesh.  Marland Kitchen HERNIA REPAIR Left 05/31/2015   Large Atrium pro loop mesh, direct defect.   . INGUINAL HERNIA REPAIR Left 05/31/2015   Procedure: HERNIA REPAIR INGUINAL ADULT;  Surgeon: Robert Bellow, MD;  Location: [facility];  Service: General;  Laterality: Left;  . KNEE SURGERY  2009   [facility]  . LITHOTRIPSY     x 3  last  one  2013  . RHINOPLASTY  2006   Dr. Nadeen Landau, [facility]  . TEE WITHOUT CARDIOVERSION N/A 11/01/2013   Procedure: TRANSESOPHAGEAL ECHOCARDIOGRAM (TEE);  Surgeon: Candee Furbish, MD;  Location: [facility] ENDOSCOPY;  Service: Cardiovascular;  Laterality: N/A;  . TEE WITHOUT CARDIOVERSION N/A 03/28/2014   Procedure: TRANSESOPHAGEAL ECHOCARDIOGRAM (TEE);  Surgeon: Larey Dresser, MD;  Location: [place];  Service: Cardiovascular;  Laterality: N/A;    ROS- all systems are reviewed and negatives except as per HPI above  Current Outpatient Prescriptions  Medication Sig Dispense Refill  . aspirin EC 81 MG tablet Take 1 tablet (81 mg total) by mouth daily. 90 tablet 3  . atorvastatin (LIPITOR) 10 MG tablet TAKE 1 TABLET (10 MG TOTAL) BY MOUTH EVERY EVENING. 90 tablet 3  . cetirizine (ZYRTEC) 10 MG tablet TAKE 1 TABLET (10 MG TOTAL) BY MOUTH DAILY AS NEEDED FOR ALLERGIES. 30 tablet 11  . diltiazem (CARDIZEM CD) 180 MG 24 hr capsule TAKE 1 CAPSULE (180 MG TOTAL) BY MOUTH DAILY. (Patient taking differently: TAKE 1 CAPSULE (180 MG TOTAL) BY MOUTH WITH SUPPER.) 90 capsule 3  . diltiazem (CARDIZEM) 30 MG tablet TAKE 1-2 TABLETS BY MOUTH EVERY 6 HOURS AS NEEDED FOR PALPITAIONS 30 tablet 6  . potassium chloride SA (K-DUR,KLOR-CON) 20 MEQ tablet Take 1 tablet (20 mEq total) by mouth daily. 30 tablet 3  .  TIKOSYN 500 MCG capsule TAKE 1 CAPSULE (500 MCG TOTAL) BY MOUTH 2 (TWO) TIMES DAILY. 180 capsule 2   No current facility-administered medications for this visit.     Physical Exam: Vitals:   12/02/16 0826  BP: 130/80  Pulse: 84  SpO2: 96%  Weight: 191 lb (86.6 kg)  Height: 5\' 10"  (1.778 m)    GEN- The patient is well appearing, alert and oriented x 3 today.   Head- normocephalic, atraumatic Eyes-  Sclera clear, conjunctiva pink Ears- hearing intact Oropharynx- clear Lungs- Clear to ausculation bilaterally, normal work of breathing Heart- Regular rate and rhythm, no murmurs, rubs or gallops, PMI not  laterally displaced GI- soft, NT, ND, + BS Extremities- no clubbing, cyanosis, or edema  EKG tracing ordered today is personally reviewed and shows sinus rhythm 83 bpm, Qtc 498 msec  Assessment and Plan:  1. Persistent afib Doing well s/p ablation on tikosyn still chads2vasc score is 1.  Anticoagulation has been discontinued  2. HTN Stable No change required today  3. Calcium score on CT  Return in 3 months Labs on return  Thompson Grayer MD, [facility] 12/02/2016 8:36 AM

## 2016-12-02 NOTE — Patient Instructions (Signed)

## 2017-01-02 ENCOUNTER — Other Ambulatory Visit: Payer: Self-pay | Admitting: *Deleted

## 2017-01-02 MED ORDER — TIKOSYN 500 MCG PO CAPS: 500.0000 ug | ORAL_CAPSULE | Freq: Two times a day (BID) | ORAL | 3 refills | Status: DC

## 2017-03-07 ENCOUNTER — Other Ambulatory Visit: Payer: Self-pay | Admitting: Family Medicine

## 2017-03-12 ENCOUNTER — Ambulatory Visit: Payer: BC Managed Care – PPO | Admitting: Internal Medicine

## 2017-03-12 ENCOUNTER — Encounter: Payer: Self-pay | Admitting: Internal Medicine

## 2017-03-12 VITALS — BP 120/76 | HR 82 | Ht 71.0 in | Wt 197.0 lb

## 2017-03-12 DIAGNOSIS — I481 Persistent atrial fibrillation: Secondary | ICD-10-CM

## 2017-03-12 DIAGNOSIS — I10 Essential (primary) hypertension: Secondary | ICD-10-CM

## 2017-03-12 DIAGNOSIS — I4819 Other persistent atrial fibrillation: Secondary | ICD-10-CM

## 2017-03-12 NOTE — Progress Notes (Signed)
PCP: Jerrol Banana., MD   Primary EP: Dr Rayann Heman  Jorge [name] is a 51 y.o. male who presents today for routine electrophysiology followup.  Since last being seen in our clinic, the patient reports doing very well.  He has started exercising regularly without limitation.  He has occasional palpitations but feels overall quite well.  Today, he denies symptoms of chest pain, shortness of breath,  lower extremity edema, dizziness, presyncope, or syncope.  The patient is otherwise without complaint today.   Past Medical History:  Diagnosis Date  . Atrial fibrillation ([place])    first diagnosed 2011; s/p DCCV at that time; ablation 10-2013 & 03/2014; failed multaq, flecainide and amiodarone  . Chronic anticoagulation    Xarelto  . Chronic kidney disease    H/O KIDNEY STONES  . Dysrhythmia   . Headache   . Hyperlipidemia   . Hypertension   . Sinusitis    Past Surgical History:  Procedure Laterality Date  . APPENDECTOMY  1975  . CARDIOVERSION     Hx of  . COLONOSCOPY  2016  . HERNIA REPAIR Left 05/28/2013   left inguinal henria repair, robotic direct defect repaired with Bard ultralight mesh.  Marland Kitchen HERNIA REPAIR Left 05/31/2015   Large Atrium pro loop mesh, direct defect.   Marland Kitchen KNEE SURGERY  2009   [facility]  . LITHOTRIPSY     x 3  last one  2013  . RHINOPLASTY  2006   Dr. Nadeen Landau, [place]    ROS- all systems are reviewed and negatives except as per HPI above  Current Outpatient Medications  Medication Sig Dispense Refill  . aspirin EC 81 MG tablet Take 1 tablet (81 mg total) by mouth daily. 90 tablet 3  . atorvastatin (LIPITOR) 10 MG tablet TAKE 1 TABLET(10 MG TOTAL) BY MOUTH EVERY EVENING. 90 tablet 3  . cetirizine (ZYRTEC) 10 MG tablet TAKE 1 TABLET (10 MG TOTAL) BY MOUTH DAILY AS NEEDED FOR ALLERGIES. 30 tablet 11  . diltiazem (CARDIZEM CD) 180 MG 24 hr capsule TAKE 1 CAPSULE (180 MG TOTAL) BY MOUTH DAILY. (Patient taking differently: TAKE 1  CAPSULE (180 MG TOTAL) BY MOUTH DAILY) 90 capsule 3  . diltiazem (CARDIZEM) 30 MG tablet TAKE 1-2 TABLETS BY MOUTH EVERY 6 HOURS AS NEEDED FOR PALPITAIONS 30 tablet 6  . potassium chloride SA (K-DUR,KLOR-CON) 20 MEQ tablet Take 1 tablet (20 mEq total) by mouth daily. 30 tablet 3  . TIKOSYN 500 MCG capsule Take 1 capsule (500 mcg total) by mouth 2 (two) times daily. 180 capsule 3   No current facility-administered medications for this visit.     Physical Exam: Vitals:   03/12/17 0953  BP: 120/76  Pulse: 82  SpO2: 98%  Weight: 197 lb (89.4 kg)  Height: 5\' 11"  (1.803 m)    GEN- The patient is well appearing, alert and oriented x 3 today.   Head- normocephalic, atraumatic Eyes-  Sclera clear, conjunctiva pink Ears- hearing intact Oropharynx- clear Lungs- Clear to ausculation bilaterally, normal work of breathing Heart- Regular rate and rhythm, no murmurs, rubs or gallops, PMI not laterally displaced GI- soft, NT, ND, + BS Extremities- no clubbing, cyanosis, or edema  EKG tracing ordered today is personally reviewed and shows sinus rhythm 82 bpm, PR 200 msec, Qtc 481 msec  Assessment and Plan:  1. Persistent afib Doing well s/p ablation Continue tikosyn chads2vasc score is 1.  He is off of anticoagulation He has a  physical with primary care scheduled for later this month and would like to have labs deferred to then if possible. I will ask primary care to obtain bmet and magnesium at time of the physical.  2. HTN Stable No change required today  3. Calcium score on CT Normal myoview 10/18/16 Continue ASA and lipitor  Return to see me in 6 months  Thompson Grayer MD, [facility] 03/12/2017 10:18 AM

## 2017-03-12 NOTE — Patient Instructions (Addendum)
Medication Instructions:  Your physician recommends that you continue on your current medications as directed. Please refer to the Current Medication list given to you today.   Labwork: None ordered   Testing/Procedures: None ordered   Follow-Up: Your physician wants you to follow-up in: 6 months with Dr. Allred    Any Other Special Instructions Will Be Listed Below (If Applicable).     If you need a refill on your cardiac medications before your next appointment, please call your pharmacy.   

## 2017-03-17 ENCOUNTER — Encounter: Payer: BC Managed Care – PPO | Admitting: Family Medicine

## 2017-03-17 ENCOUNTER — Ambulatory Visit: Payer: BC Managed Care – PPO | Admitting: Urology

## 2017-03-17 ENCOUNTER — Encounter: Payer: Self-pay | Admitting: Urology

## 2017-03-17 VITALS — BP 114/78 | HR 83 | Ht 71.0 in | Wt 191.8 lb

## 2017-03-17 DIAGNOSIS — N200 Calculus of kidney: Secondary | ICD-10-CM

## 2017-03-17 LAB — URINALYSIS, COMPLETE
Bilirubin, UA: NEGATIVE
GLUCOSE, UA: NEGATIVE
Ketones, UA: NEGATIVE
Leukocytes, UA: NEGATIVE
Nitrite, UA: NEGATIVE
Protein, UA: NEGATIVE
RBC, UA: NEGATIVE
Specific Gravity, UA: <1.005 — ABNORMAL LOW (ref 1.005–1.030)
UUROB: 0.2 mg/dL (ref 0.2–1.0)
pH, UA: 7 (ref 5.0–7.5)

## 2017-03-17 NOTE — Progress Notes (Signed)
03/17/2017 11:57 AM   Jorge [name] April 29, 1966 [id number]  Referring provider: Jerrol Banana., MD [address] [place], [place] [address]  No chief complaint on file.   HPI: F/u -   1) flank pain. Noted left flank pain over the past few weeks which radiated down into left testicle. Sharp pain. Severe at times. Started tamsulosin. Also tramadol. Passed a small stone. No other aggravating or alleviating factors. No associated signs or symptoms. Occasional frequency, nocturia and intermittent stream. Pain improved. UA was normal.   H/o stones s/p lithotripsy 2005 and 2013 with Dr. Olena Heckle and Dr. Jacqlyn Larsen. He gets yearly PSA and DRE. PSA was 1.04 Mar 2016.   He is on Xarelto for A Fib.   He returns today. He underwent May 2018 that revealed small bilateral calculi. A renal US was normal June 2018 - no hydro, possible small stones. Since then he's had no further stone passage, flank pain or gross hematuria.    PMH: Past Medical History:  Diagnosis Date  . Atrial fibrillation ([place])    first diagnosed 2011; s/p DCCV at that time; ablation 10-2013 & 03/2014; failed multaq, flecainide and amiodarone  . Chronic anticoagulation    Xarelto  . Chronic kidney disease    H/O KIDNEY STONES  . Dysrhythmia   . Headache   . Hyperlipidemia   . Hypertension   . Sinusitis     Surgical History: Past Surgical History:  Procedure Laterality Date  . APPENDECTOMY  1975  . Atrial Fibrillation Ablation N/A 07/04/2016   Performed by Thompson Grayer, MD at [place] [facility]  . ATRIAL FIBRILLATION ABLATION N/A 03/29/2014   Performed by Thompson Grayer, MD at [facility] CATH LAB  . ATRIAL FIBRILLATION ABLATION N/A 11/02/2013   Performed by Thompson Grayer, MD at [facility] CATH LAB  . CARDIOVERSION     Hx of  . CARDIOVERSION N/A 07/06/2014   Performed by Acie Fredrickson Wonda Cheng, MD at [facility] ENDOSCOPY  . CARDIOVERSION N/A 10/15/2013   Performed by Fay Records, MD at [facility] ENDOSCOPY  . COLONOSCOPY  2016  .  HERNIA REPAIR Left 05/28/2013   left inguinal henria repair, robotic direct defect repaired with Bard ultralight mesh.  Marland Kitchen HERNIA REPAIR Left 05/31/2015   Large Atrium pro loop mesh, direct defect.   Marland Kitchen HERNIA REPAIR INGUINAL ADULT Left 05/31/2015   Performed by Robert Bellow, MD at [facility] ORS  . KNEE SURGERY  2009   [facility]  . LITHOTRIPSY     x 3  last one  2013  . RHINOPLASTY  2006   Dr. Nadeen Landau, [facility]  . TRANSESOPHAGEAL ECHOCARDIOGRAM (TEE) N/A 03/28/2014   Performed by Larey Dresser, MD at [place]  . TRANSESOPHAGEAL ECHOCARDIOGRAM (TEE) N/A 11/01/2013   Performed by Jerline Pain, MD at [place] Medications:  Allergies as of 03/17/2017      Reactions   Oxycodone Nausea And Vomiting   Vicodin [hydrocodone-acetaminophen] Nausea And Vomiting   Can take regular Tylenol      Medication List        Accurate as of 03/17/17 11:57 AM. Always use your most recent med list.          aspirin EC 81 MG tablet Take 1 tablet (81 mg total) by mouth daily.   atorvastatin 10 MG tablet Commonly known as:  LIPITOR TAKE 1 TABLET(10 MG TOTAL) BY MOUTH EVERY EVENING.   cetirizine 10 MG  tablet Commonly known as:  ZYRTEC TAKE 1 TABLET (10 MG TOTAL) BY MOUTH DAILY AS NEEDED FOR ALLERGIES.   diltiazem 180 MG 24 hr capsule Commonly known as:  CARDIZEM CD TAKE 1 CAPSULE (180 MG TOTAL) BY MOUTH DAILY.   diltiazem 30 MG tablet Commonly known as:  CARDIZEM TAKE 1-2 TABLETS BY MOUTH EVERY 6 HOURS AS NEEDED FOR PALPITAIONS   potassium chloride SA 20 MEQ tablet Commonly known as:  K-DUR,KLOR-CON Take 1 tablet (20 mEq total) by mouth daily.   TIKOSYN 500 MCG capsule Generic drug:  dofetilide Take 1 capsule (500 mcg total) by mouth 2 (two) times daily.       Allergies:  Allergies  Allergen Reactions  . Oxycodone Nausea And Vomiting  . Vicodin [Hydrocodone-Acetaminophen] Nausea And Vomiting    Can take regular Tylenol    Family  History: Family History  Problem Relation Age of Onset  . Cancer Mother        breast, cause of death  . Heart attack Maternal Grandfather 53       Cause of death  . Hypertension Maternal Grandfather   . Heart failure Brother   . CVA Paternal Grandmother        cause of death  . Atrial fibrillation Maternal Grandmother   . Colon polyps Unknown        Fam Hx  . Kidney cancer Neg Hx   . Bladder Cancer Neg Hx   . Prostate cancer Neg Hx     Social History:  reports that  has never smoked. he has never used smokeless tobacco. He reports that he drinks about 1.2 oz of alcohol per week. He reports that he does not use drugs.  ROS: UROLOGY Frequent Urination?: No Hard to postpone urination?: No Burning/pain with urination?: No Get up at night to urinate?: No Leakage of urine?: No Urine stream starts and stops?: No Trouble starting stream?: No Do you have to strain to urinate?: No Blood in urine?: No Urinary tract infection?: No Sexually transmitted disease?: No Injury to kidneys or bladder?: No Painful intercourse?: No Weak stream?: No Erection problems?: No Penile pain?: No  Gastrointestinal Nausea?: No Vomiting?: No Indigestion/heartburn?: No Diarrhea?: No Constipation?: No  Constitutional Fever: No Night sweats?: No Weight loss?: No Fatigue?: No  Skin Skin rash/lesions?: No Itching?: No  Eyes Blurred vision?: No Double vision?: No  Ears/Nose/Throat Sore throat?: No Sinus problems?: Yes  Hematologic/Lymphatic Swollen glands?: No Easy bruising?: No  Cardiovascular Leg swelling?: No Chest pain?: No  Respiratory Cough?: No Shortness of breath?: No  Endocrine Excessive thirst?: No  Musculoskeletal Back pain?: No Joint pain?: No  Neurological Headaches?: No Dizziness?: No  Psychologic Depression?: No Anxiety?: No  Physical Exam: BP 114/78   Pulse 83   Ht 5\' 11"  (1.803 m)   Wt 87 kg (191 lb 12.8 oz)   BMI 26.75 kg/m    Constitutional:  Alert and oriented, No acute distress. HEENT: [place] AT, moist mucus membranes.  Trachea midline, no masses. Cardiovascular: No clubbing, cyanosis, or edema. Respiratory: Normal respiratory effort, no increased work of breathing. GI: Abdomen is soft, nontender, nondistended, no abdominal masses GU: No CVA tenderness. Skin: No rashes, bruises or suspicious lesions. Neurologic: Grossly intact, no focal deficits, moving all 4 extremities. Psychiatric: Normal mood and affect.  Laboratory Data: Lab Results  Component Value Date   WBC 8.0 06/26/2016   HGB 14.7 06/26/2016   HCT 42.5 06/26/2016   MCV 88 06/26/2016   PLT 251 06/26/2016  Lab Results  Component Value Date   CREATININE 0.86 08/21/2016    Lab Results  Component Value Date   PSA1 1.6 03/11/2016    No results found for: TESTOSTERONE  No results found for: HGBA1C  Urinalysis Lab Results  Component Value Date   SPECGRAV 1.010 09/18/2016   PHUR 6.5 09/18/2016   COLORU Yellow 09/18/2016   APPEARANCEUR Clear 09/18/2016   LEUKOCYTESUR Negative 09/18/2016   PROTEINUR Negative 09/18/2016   GLUCOSEU Negative 09/18/2016   KETONESU Negative 09/18/2016   RBCU Negative 09/18/2016   BILIRUBINUR Negative 09/18/2016   UUROB 0.2 09/18/2016   NITRITE Negative 09/18/2016    No results found for: LABMICR, [place], RBCUA, LABEPIT, MUCUS, BACTERIA  Pertinent Imaging: [place] and KUB Results for orders placed during the hospital encounter of 09/18/16  Abdomen 1 view (KUB)   Narrative CLINICAL DATA:  Kidney stones.  Flank pain  EXAM: ABDOMEN - 1 VIEW  COMPARISON:  KUB 11/06/2011  FINDINGS: Moderate stool in the right colon overlying the right kidney. This obscures evaluation for renal calculi on the right. There is a probable small renal calculus on the right measuring 2 x 3 mm.  Small punctate renal calculi on the left unchanged from the prior study.  Vascular calcifications in the pelvis.  Normal bowel  gas pattern.  No skeletal abnormality.  IMPRESSION: Small bilateral renal calculi, partially obscured by stool on the right.   Electronically Signed   By: Franchot Gallo M.D.   On: 09/18/2016 14:33    No results found for this or any previous visit. No results found for this or any previous visit. No results found for this or any previous visit. Results for orders placed during the hospital encounter of 10/02/16  US Renal   Narrative CLINICAL DATA:  Kidney stones, history hypertension  EXAM: RENAL / URINARY TRACT ULTRASOUND COMPLETE  COMPARISON:  None ; correlation abdominal radiograph 09/18/2016  FINDINGS: Right Kidney:  Length: 10.9 cm. Normal cortical thickness and echogenicity. 5 mm echogenic focus at mid RIGHT kidney with questionable shadowing. Additional 4 mm nonshadowing echogenic focus at inferior pole RIGHT kidney, nonspecific. Small cyst at upper pole 12 x 9 x 12 mm. No additional mass or hydronephrosis. No perinephric fluid.  Left Kidney:  Length: 12.5 cm. Normal cortical thickness and echogenicity. Nonspecific 4 mm echogenic focus mid LEFT kidney without definite shadowing. No solid mass, hydronephrosis or perinephric fluid.  Bladder:  Normal appearance.  BILATERAL ureteral jets noted.  IMPRESSION: No evidence of worrisome renal mass or hydronephrosis.  Small cyst RIGHT kidney 12 mm diameter.  Echogenic foci in both kidneys without definite shadowing, questionably corresponding to tiny calculi seen on recent abdominal radiograph.   Electronically Signed   By: Lavonia Dana M.D.   On: 10/02/2016 17:35    No results found for this or any previous visit. No results found for this or any previous visit. No results found for this or any previous visit.  Assessment & Plan:    1. Kidney stones See in 1 year or sooner if issues.  - Urinalysis, Complete   No Follow-up on file.  Festus Aloe, MD  [facility] [address], [place], [place] [address] [phone]

## 2017-03-26 ENCOUNTER — Ambulatory Visit (INDEPENDENT_AMBULATORY_CARE_PROVIDER_SITE_OTHER): Payer: BC Managed Care – PPO | Admitting: Family Medicine

## 2017-03-26 ENCOUNTER — Encounter: Payer: Self-pay | Admitting: Family Medicine

## 2017-03-26 VITALS — BP 108/60 | HR 98 | Temp 98.0°F | Resp 16 | Ht 71.0 in | Wt 196.0 lb

## 2017-03-26 DIAGNOSIS — Z1211 Encounter for screening for malignant neoplasm of colon: Secondary | ICD-10-CM

## 2017-03-26 DIAGNOSIS — I4819 Other persistent atrial fibrillation: Secondary | ICD-10-CM

## 2017-03-26 DIAGNOSIS — R0683 Snoring: Secondary | ICD-10-CM

## 2017-03-26 DIAGNOSIS — Z23 Encounter for immunization: Secondary | ICD-10-CM

## 2017-03-26 DIAGNOSIS — R0681 Apnea, not elsewhere classified: Secondary | ICD-10-CM

## 2017-03-26 DIAGNOSIS — Z125 Encounter for screening for malignant neoplasm of prostate: Secondary | ICD-10-CM

## 2017-03-26 DIAGNOSIS — M542 Cervicalgia: Secondary | ICD-10-CM

## 2017-03-26 DIAGNOSIS — Z0000 Encounter for general adult medical examination without abnormal findings: Secondary | ICD-10-CM | POA: Diagnosis not present

## 2017-03-26 DIAGNOSIS — R5383 Other fatigue: Secondary | ICD-10-CM

## 2017-03-26 DIAGNOSIS — I481 Persistent atrial fibrillation: Secondary | ICD-10-CM

## 2017-03-26 LAB — POCT URINALYSIS DIPSTICK
BILIRUBIN UA: NEGATIVE
Blood, UA: NEGATIVE
GLUCOSE UA: NEGATIVE
KETONES UA: NEGATIVE
Leukocytes, UA: NEGATIVE
Nitrite, UA: NEGATIVE
Protein, UA: NEGATIVE
SPEC GRAV UA: 1.01 (ref 1.010–1.025)
Urobilinogen, UA: 0.2 E.U./dL
pH, UA: 6.5 (ref 5.0–8.0)

## 2017-03-26 LAB — IFOBT (OCCULT BLOOD): IMMUNOLOGICAL FECAL OCCULT BLOOD TEST: NEGATIVE

## 2017-03-26 MED ORDER — NAPROXEN 500 MG PO TBEC
500.0000 mg | DELAYED_RELEASE_TABLET | Freq: Two times a day (BID) | ORAL | 3 refills | Status: DC
Start: 2017-03-26 — End: 2017-12-17

## 2017-03-26 NOTE — Patient Instructions (Signed)
Check with you wife to see if you stop breathing at night or snore. Let us know if you do and we will get you scheduled for a sleep study.

## 2017-03-26 NOTE — Progress Notes (Signed)
Patient: Jorge [name], Male    DOB: 22-May-1965, 51 y.o.   MRN: [id number] Visit Date: 03/26/2017  Today's Provider: Wilhemena Durie, MD   Chief Complaint  Patient presents with  . Annual Exam   Subjective:    Annual physical exam Jorge [name] is a 51 y.o. male who presents today for health maintenance and complete physical. He feels well. He reports exercising about 4-5 times a week. He reports he is sleeping well.  Colonoscopy- 01/19/2015 entire colon was normal  Review of Systems  Constitutional: Positive for fatigue.  HENT: Negative.   Eyes: Negative.   Respiratory: Negative.   Cardiovascular: Positive for palpitations (nothing new for pt. He has known Afib).  Gastrointestinal: Negative.   Endocrine: Negative.   Genitourinary: Negative.        Decreased libido over past year.  Musculoskeletal: Positive for neck pain and neck stiffness.  Skin: Negative.   Allergic/Immunologic: Positive for environmental allergies.  Neurological: Positive for numbness.       Mild intermittant numbness of left hand with some neck pain  Hematological: Negative.   Psychiatric/Behavioral: The patient is nervous/anxious.     Social History      He  reports that  has never smoked. he has never used smokeless tobacco. He reports that he drinks about 1.2 oz of alcohol per week. He reports that he does not use drugs.       Social History   Socioeconomic History  . Marital status: Married    Spouse name: None  . Number of children: None  . Years of education: None  . Highest education level: None  Social Needs  . Financial resource strain: None  . Food insecurity - worry: None  . Food insecurity - inability: None  . Transportation needs - medical: None  . Transportation needs - non-medical: None  Occupational History  . Occupation: Passenger transport manager w/ [employer]  Tobacco Use  . Smoking status: Never Smoker  . Smokeless tobacco: Never Used  Substance and Sexual  Activity  . Alcohol use: Yes    Alcohol/week: 1.2 oz    Types: 2 Cans of beer per week    Comment: occasionally  . Drug use: No  . Sexual activity: Yes  Other Topics Concern  . None  Social History Narrative   Lives in [place] with his spouse.  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[employer]    Past Medical History:  Diagnosis Date  . Atrial fibrillation ([place])    first diagnosed 2011; s/p DCCV at that time; ablation 10-2013 & 03/2014; failed multaq, flecainide and amiodarone  . Chronic anticoagulation    Xarelto  . Chronic kidney disease    H/O KIDNEY STONES  . Dysrhythmia   . Headache   . Hyperlipidemia   . Hypertension   . Sinusitis      Patient Active Problem List   Diagnosis Date Noted  . A-fib ([place]) 07/04/2016  . Allergic rhinitis 03/08/2015  . Personal history of surgery to heart and great vessels, presenting hazards to health 03/08/2015  . HLD (hyperlipidemia) 03/08/2015  . Atrial fibrillation with rapid ventricular response ([place]) 07/04/2014  . Chronic anticoagulation - Xarelto   . Paroxysmal atrial fibrillation ([place]) 03/29/2014  . Essential hypertension 12/15/2013  . Atrial fibrillation ([place]) 06/13/2013    Past Surgical History:  Procedure Laterality Date  . APPENDECTOMY  1975  . ATRIAL FIBRILLATION ABLATION N/A 11/02/2013   PVI by  Dr Rayann Heman  . ATRIAL FIBRILLATION ABLATION N/A 03/29/2014   PVI and CTI by Dr Rayann Heman  . ATRIAL FIBRILLATION ABLATION N/A 07/04/2016   Procedure: Atrial Fibrillation Ablation;  Surgeon: Thompson Grayer, MD;  Location: [facility];  Service: Cardiovascular;  Laterality: N/A;  . CARDIOVERSION     Hx of  . CARDIOVERSION N/A 10/15/2013   Procedure: CARDIOVERSION;  Surgeon: Fay Records, MD;  Location: [facility];  Service: Cardiovascular;  Laterality: N/A;  . COLONOSCOPY  2016  . HERNIA REPAIR Left 05/28/2013   left inguinal henria repair, robotic direct defect repaired with Bard ultralight mesh.  Marland Kitchen HERNIA REPAIR Left  05/31/2015   Large Atrium pro loop mesh, direct defect.   . INGUINAL HERNIA REPAIR Left 05/31/2015   Procedure: HERNIA REPAIR INGUINAL ADULT;  Surgeon: Robert Bellow, MD;  Location: [facility];  Service: General;  Laterality: Left;  . KNEE SURGERY  2009   [facility]  . LITHOTRIPSY     x 3  last one  2013  . RHINOPLASTY  2006   Dr. Nadeen Landau, [facility]  . TEE WITHOUT CARDIOVERSION N/A 11/01/2013   Procedure: TRANSESOPHAGEAL ECHOCARDIOGRAM (TEE);  Surgeon: Candee Furbish, MD;  Location: [facility];  Service: Cardiovascular;  Laterality: N/A;  . TEE WITHOUT CARDIOVERSION N/A 03/28/2014   Procedure: TRANSESOPHAGEAL ECHOCARDIOGRAM (TEE);  Surgeon: Larey Dresser, MD;  Location: [facility] ENDOSCOPY;  Service: Cardiovascular;  Laterality: N/A;    Family History        Family Status  Relation Name Status  . Mother  Deceased at age 31  . MGF  Deceased at age 74       from MI  . Brother  Alive       stable health, mental health issues  . PGM  Deceased       From CVA  . Father  Deceased at age 29       suicide  . MGM  Deceased  . Unknown  (Not Specified)  . PGF  Deceased  . Neg Hx  (Not Specified)        His family history includes Atrial fibrillation in his maternal grandmother; CVA in his paternal grandmother; Cancer in his mother; Colon polyps in his unknown relative; Heart attack (age of onset: 58) in his maternal grandfather; Heart failure in his brother; Hypertension in his maternal grandfather.     Allergies  Allergen Reactions  . Oxycodone Nausea And Vomiting  . Vicodin [Hydrocodone-Acetaminophen] Nausea And Vomiting    Can take regular Tylenol     Current Outpatient Medications:  .  aspirin EC 81 MG tablet, Take 1 tablet (81 mg total) by mouth daily., Disp: 90 tablet, Rfl: 3 .  atorvastatin (LIPITOR) 10 MG tablet, TAKE 1 TABLET(10 MG TOTAL) BY MOUTH EVERY EVENING., Disp: 90 tablet, Rfl: 3 .  cetirizine (ZYRTEC) 10 MG tablet, TAKE 1 TABLET (10 MG TOTAL) BY MOUTH  DAILY AS NEEDED FOR ALLERGIES., Disp: 30 tablet, Rfl: 11 .  diltiazem (CARDIZEM CD) 180 MG 24 hr capsule, TAKE 1 CAPSULE (180 MG TOTAL) BY MOUTH DAILY. (Patient taking differently: TAKE 1 CAPSULE (180 MG TOTAL) BY MOUTH DAILY), Disp: 90 capsule, Rfl: 3 .  diltiazem (CARDIZEM) 30 MG tablet, TAKE 1-2 TABLETS BY MOUTH EVERY 6 HOURS AS NEEDED FOR PALPITAIONS, Disp: 30 tablet, Rfl: 6 .  TIKOSYN 500 MCG capsule, Take 1 capsule (500 mcg total) by mouth 2 (two) times daily., Disp: 180 capsule, Rfl: 3 .  potassium chloride SA (  K-DUR,KLOR-CON) 20 MEQ tablet, Take 1 tablet (20 mEq total) by mouth daily. (Patient not taking: Reported on 03/17/2017), Disp: 30 tablet, Rfl: 3   Patient Care Team: Jerrol Banana., MD as PCP - General (Family Medicine) Jerrol Banana., MD (Family Medicine) Bary Castilla, Forest Gleason, MD (General Surgery)      Objective:   Vitals: BP 108/60 (BP Location: Left Arm, Patient Position: Sitting, Cuff Size: Large)   Pulse 98   Temp 98 F (36.7 C) (Other (Comment))   Resp 16   Ht 5\' 11"  (1.803 m)   Wt 196 lb (88.9 kg)   BMI 27.34 kg/m    Vitals:   03/26/17 1036  BP: 108/60  Pulse: 98  Resp: 16  Temp: 98 F (36.7 C)  TempSrc: Other (Comment)  Weight: 196 lb (88.9 kg)  Height: 5\' 11"  (1.803 m)     Physical Exam  Constitutional: He is oriented to person, place, and time. He appears well-developed and well-nourished.  HENT:  Head: Normocephalic and atraumatic.  Right Ear: External ear normal.  Left Ear: External ear normal.  Nose: Nose normal.  Mouth/Throat: Oropharynx is clear and moist.  Eyes: Conjunctivae and EOM are normal. Pupils are equal, round, and reactive to light.  Neck: Normal range of motion. Neck supple.  Cardiovascular: Normal rate, regular rhythm, normal heart sounds and intact distal pulses.  Pulmonary/Chest: Effort normal and breath sounds normal.  Abdominal: Soft. Bowel sounds are normal.  Genitourinary: Rectum normal, prostate normal and  penis normal.  Musculoskeletal: Normal range of motion.  Neurological: He is alert and oriented to person, place, and time. He has normal reflexes.  Skin: Skin is warm and dry.  Psychiatric: He has a normal mood and affect. His behavior is normal. Judgment and thought content normal.     Depression Screen PHQ 2/9 Scores 03/26/2017 03/08/2015  PHQ - 2 Score 0 0  PHQ- 9 Score 1 -      Assessment & Plan:     Routine Health Maintenance and Physical Exam  Exercise Activities and Dietary recommendations Goals    None      Immunization History  Administered Date(s) Administered  . Influenza,inj,Quad PF,6+ Mos 03/13/2016  . Tdap 02/09/2013    Health Maintenance  Topic Date Due  . HIV Screening  08/13/1980  . INFLUENZA VACCINE  11/27/2016  . TETANUS/TDAP  02/10/2023  . COLONOSCOPY  01/18/2025     Discussed health benefits of physical activity, and encouraged him to engage in regular exercise appropriate for his age and condition.  Fatigue/low libido Check testosterone. Possible Cervical Radiculopathy Follow. Chronic AFib Per cardiology. Apnea    --------------------------------------------------------------------  I have done the exam and reviewed the above chart and it is accurate to the best of my knowledge. Development worker, community has been used in this note in any air is in the dictation or transcription are unintentional.   Wilhemena Durie, MD  [place]

## 2017-03-28 LAB — CBC WITH DIFFERENTIAL/PLATELET
BASOS ABS: 28 {cells}/uL (ref 0–200)
Basophils Relative: 0.4 %
EOS ABS: 238 {cells}/uL (ref 15–500)
Eosinophils Relative: 3.4 %
HEMATOCRIT: 44.2 % (ref 38.5–50.0)
Hemoglobin: 15.5 g/dL (ref 13.2–17.1)
LYMPHS ABS: 1533 {cells}/uL (ref 850–3900)
MCH: 30.3 pg (ref 27.0–33.0)
MCHC: 35.1 g/dL (ref 32.0–36.0)
MCV: 86.5 fL (ref 80.0–100.0)
MPV: 10 fL (ref 7.5–12.5)
Monocytes Relative: 8.3 %
NEUTROS PCT: 66 %
Neutro Abs: 4620 cells/uL (ref 1500–7800)
Platelets: 255 10*3/uL (ref 140–400)
RBC: 5.11 10*6/uL (ref 4.20–5.80)
RDW: 12.2 % (ref 11.0–15.0)
Total Lymphocyte: 21.9 %
WBC: 7 10*3/uL (ref 3.8–10.8)
WBCMIX: 581 {cells}/uL (ref 200–950)

## 2017-03-28 LAB — LIPID PANEL
CHOL/HDL RATIO: 3.6 (calc) (ref <5.0)
Cholesterol: 197 mg/dL (ref <200)
HDL: 55 mg/dL (ref >40)
LDL Cholesterol (Calc): 123 mg/dL (calc) — ABNORMAL HIGH
Non-HDL Cholesterol (Calc): 142 mg/dL (calc) — ABNORMAL HIGH (ref <130)
Triglycerides: 90 mg/dL (ref <150)

## 2017-03-28 LAB — TESTOSTERONE TOTAL,FREE,BIO, MALES
ALBUMIN MSPROF: 4.4 g/dL (ref 3.6–5.1)
SEX HORMONE BINDING: 39 nmol/L (ref 10–50)
TESTOSTERONE BIOAVAILABLE: 179.6 ng/dL (ref >=110.0)
Testosterone, Free: 89.2 pg/mL (ref 46.0–224.0)
Testosterone: 711 ng/dL (ref 250–827)

## 2017-03-28 LAB — PSA: PSA: 1.3 ng/mL (ref <=4.0)

## 2017-03-28 LAB — MAGNESIUM: MAGNESIUM: 1.9 mg/dL (ref 1.5–2.5)

## 2017-03-28 LAB — TSH: TSH: 1.09 m[IU]/L (ref 0.40–4.50)

## 2017-04-09 ENCOUNTER — Telehealth: Payer: Self-pay | Admitting: Internal Medicine

## 2017-04-09 MED ORDER — DOFETILIDE 500 MCG PO CAPS: 500.0000 ug | ORAL_CAPSULE | Freq: Two times a day (BID) | ORAL | 3 refills | Status: DC

## 2017-04-09 NOTE — Telephone Encounter (Signed)
Follow Up:    Can the prescription be changed to the Generic Tikosyn so pt insurance will pay for it.Pt have been taking the generic. Please call this asap please,pt is completely out of his medicine.

## 2017-04-09 NOTE — Telephone Encounter (Signed)
Spoke with pharmacist at [facility] and gave okay for patients tikosyn to be changed to generic. He stated that the patient had been refilling the brand name a while back which he had discount card for, but he has now been refilling the generic. Most recent orders in epic were for brand name but the pharmacist states that the called on those and received verbal okay to dispense as generic. I will also update on med list so that it will be ordered as generic for future refills.

## 2017-04-09 NOTE — Addendum Note (Signed)
Addended by: Juventino Slovak on: 04/09/2017 05:13 PM   Modules accepted: Orders

## 2017-05-26 ENCOUNTER — Telehealth: Payer: Self-pay | Admitting: Family Medicine

## 2017-05-26 NOTE — Telephone Encounter (Signed)
Pt is requesting a call back to get his sleep study results. Pt stated sleep study was done December 2018. Pt advised that he had received a call from [facility] about some equipment that our office had ordered for him. Pt stated that he would like to get his results and know what Dr. Rosanna Randy advises before he moves forward with [facility]. Please advise. Thanks TNP

## 2017-05-27 NOTE — Telephone Encounter (Signed)
I do not see the report result anywhere in his chart.  I see the order for [facility] but not the report.  Checking to see if possibly they are waiting to scan it.

## 2017-05-29 ENCOUNTER — Telehealth: Payer: Self-pay | Admitting: *Deleted

## 2017-05-29 NOTE — Telephone Encounter (Signed)
Received prior authorization form from [facility] for patients DOFETILIDE, completed and faxed back to them.

## 2017-06-04 NOTE — Telephone Encounter (Signed)
Approval on Dofetilide PA received via fax from [place]. Approval good from 06/03/2017 until 06/04/2019.  I have notified the pts pharmacy.

## 2017-06-09 ENCOUNTER — Other Ambulatory Visit: Payer: Self-pay | Admitting: Internal Medicine

## 2017-06-16 ENCOUNTER — Encounter: Payer: Self-pay | Admitting: Family Medicine

## 2017-08-18 ENCOUNTER — Other Ambulatory Visit: Payer: Self-pay | Admitting: Internal Medicine

## 2017-09-02 ENCOUNTER — Other Ambulatory Visit: Payer: Self-pay | Admitting: Family Medicine

## 2017-09-08 ENCOUNTER — Other Ambulatory Visit: Payer: Self-pay | Admitting: Family Medicine

## 2017-09-23 ENCOUNTER — Ambulatory Visit: Payer: BC Managed Care – PPO | Admitting: Family Medicine

## 2017-09-23 VITALS — BP 120/70 | HR 74 | Temp 97.6°F | Resp 16 | Wt 193.0 lb

## 2017-09-23 DIAGNOSIS — E785 Hyperlipidemia, unspecified: Secondary | ICD-10-CM

## 2017-09-23 DIAGNOSIS — I10 Essential (primary) hypertension: Secondary | ICD-10-CM | POA: Diagnosis not present

## 2017-09-23 NOTE — Progress Notes (Signed)
Jorge [name]  MRN: [id number] DOB: 07/20/1965  Subjective:  HPI   The patient is a 52 year old male who presents today for follow up of hypertension.  He was last seen on 03/26/17 for his annual physical.    Hypertension- BP Readings from Last 3 Encounters:  09/23/17 120/70  03/26/17 108/60  03/17/17 114/78   Hypercholesterolemia- Lab Results  Component Value Date   CHOL 197 03/27/2017   HDL 55 03/27/2017   LDLCALC 123 (H) 03/27/2017   TRIG 90 03/27/2017   CHOLHDL 3.6 03/27/2017     Patient Active Problem List   Diagnosis Date Noted  . A-fib ([place]) 07/04/2016  . Allergic rhinitis 03/08/2015  . HLD (hyperlipidemia) 03/08/2015  . Atrial fibrillation with rapid ventricular response ([place]) 07/04/2014  . Chronic anticoagulation - Xarelto   . Paroxysmal atrial fibrillation ([place]) 03/29/2014  . Essential hypertension 12/15/2013  . Atrial fibrillation ([place]) 06/13/2013    Past Medical History:  Diagnosis Date  . Atrial fibrillation ([place])    first diagnosed 2011; s/p DCCV at that time; ablation 10-2013 & 03/2014; failed multaq, flecainide and amiodarone  . Chronic anticoagulation    Xarelto  . Chronic kidney disease    H/O KIDNEY STONES  . Dysrhythmia   . Headache   . Hyperlipidemia   . Hypertension   . Sinusitis     Social History   Socioeconomic History  . Marital status: Married    Spouse name: Not on file  . Number of children: Not on file  . Years of education: Not on file  . Highest education level: Not on file  Occupational History  . Occupation: Passenger transport manager w/ [employer]  Social Needs  . Financial resource strain: Not on file  . Food insecurity:    Worry: Not on file    Inability: Not on file  . Transportation needs:    Medical: Not on file    Non-medical: Not on file  Tobacco Use  . Smoking status: Never Smoker  . Smokeless tobacco: Never Used  Substance and Sexual Activity  . Alcohol use: Yes    Alcohol/week: 1.2 oz    Types: 2  Cans of beer per week    Comment: occasionally  . Drug use: No  . Sexual activity: Yes  Lifestyle  . Physical activity:    Days per week: Not on file    Minutes per session: Not on file  . Stress: Not on file  Relationships  . Social connections:    Talks on phone: Not on file    Gets together: Not on file    Attends religious service: Not on file    Active member of club or organization: Not on file    Attends meetings of clubs or organizations: Not on file    Relationship status: Not on file  . Intimate partner violence:    Fear of current or ex partner: Not on file    Emotionally abused: Not on file    Physically abused: Not on file    Forced sexual activity: Not on file  Other Topics Concern  . Not on file  Social History Narrative   Lives in [place] with his spouse.  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[employer]    Outpatient Encounter Medications as of 09/23/2017  Medication Sig  . aspirin EC 81 MG tablet Take 1 tablet (81 mg total) by mouth daily.  Marland Kitchen atorvastatin (LIPITOR) 10 MG tablet TAKE 1 TABLET(10 MG  TOTAL) BY MOUTH EVERY EVENING.  . cetirizine (ZYRTEC) 10 MG tablet TAKE 1 TABLET (10 MG TOTAL) BY MOUTH DAILY AS NEEDED FOR ALLERGIES.  Marland Kitchen diltiazem (CARDIZEM CD) 180 MG 24 hr capsule TAKE 1 CAPSULE (180 MG TOTAL) BY MOUTH DAILY. (Patient taking differently: TAKE 1 CAPSULE (180 MG TOTAL) BY MOUTH DAILY)  . diltiazem (CARDIZEM) 30 MG tablet TAKE ONE TO TWO TABLETS EVERY 6 HOURS AS NEEDED FOR PALPITATIONS.  Marland Kitchen diltiazem (TIAZAC) 180 MG 24 hr capsule TAKE ONE CAPSULE (180 MG TOTAL) BY MOUTH DAILY.  Marland Kitchen dofetilide (TIKOSYN) 500 MCG capsule Take 1 capsule (500 mcg total) by mouth 2 (two) times daily.  . naproxen (EC NAPROSYN) 500 MG EC tablet Take 1 tablet (500 mg total) by mouth 2 (two) times daily with a meal.  . potassium chloride SA (K-DUR,KLOR-CON) 20 MEQ tablet Take 1 tablet (20 mEq total) by mouth daily.  . traMADol (ULTRAM) 50 MG tablet TAKE (1) TABLET EVERY  EIGHT HOURS AS NEEDED.   No facility-administered encounter medications on file as of 09/23/2017.     Allergies  Allergen Reactions  . Oxycodone Nausea And Vomiting  . Vicodin [Hydrocodone-Acetaminophen] Nausea And Vomiting    Can take regular Tylenol    Review of Systems  Constitutional: Negative for fever and malaise/fatigue.  HENT: Negative.   Eyes: Negative.   Respiratory: Negative for cough and shortness of breath.   Cardiovascular: Positive for palpitations (chronic and unchanged). Negative for chest pain and leg swelling.  Gastrointestinal: Negative.   Skin: Negative.   Neurological: Negative.   Psychiatric/Behavioral: Negative.     Objective:  BP 120/70 (BP Location: Right Arm, Patient Position: Sitting, Cuff Size: Normal)   Pulse 74   Temp 97.6 F (36.4 C) (Oral)   Resp 16   Wt 193 lb (87.5 kg)   BMI 26.92 kg/m   Physical Exam  Constitutional: He is oriented to person, place, and time and well-developed, well-nourished, and in no distress.  HENT:  Head: Normocephalic and atraumatic.  Right Ear: External ear normal.  Left Ear: External ear normal.  Nose: Nose normal.  Eyes: Conjunctivae are normal. No scleral icterus.  Neck: No thyromegaly present.  Cardiovascular: Normal rate, regular rhythm and normal heart sounds.  Pulmonary/Chest: Effort normal and breath sounds normal.  Abdominal: Soft.  Neurological: He is alert and oriented to person, place, and time. Gait normal. GCS score is 15.  Skin: Skin is warm and dry.  Psychiatric: Mood, memory, affect and judgment normal.    Assessment and Plan :   1. Essential hypertension  - Comprehensive metabolic panel  2. Hyperlipidemia, unspecified hyperlipidemia type  - Lipid Panel With LDL/HDL Ratio   I have done the exam and reviewed the chart and it is accurate to the best of my knowledge. Development worker, community has been used and  any errors in dictation or transcription are unintentional. Miguel Aschoff  M.D. [facility]

## 2017-09-24 LAB — COMPREHENSIVE METABOLIC PANEL
ALBUMIN: 4.7 g/dL (ref 3.5–5.5)
ALT: 24 IU/L (ref 0–44)
AST: 22 IU/L (ref 0–40)
Albumin/Globulin Ratio: 2.2 (ref 1.2–2.2)
Alkaline Phosphatase: 60 IU/L (ref 39–117)
BUN / CREAT RATIO: 29 — AB (ref 9–20)
BUN: 21 mg/dL (ref 6–24)
Bilirubin Total: 0.6 mg/dL (ref 0.0–1.2)
CO2: 22 mmol/L (ref 20–29)
CREATININE: 0.72 mg/dL — AB (ref 0.76–1.27)
Calcium: 9.1 mg/dL (ref 8.7–10.2)
Chloride: 101 mmol/L (ref 96–106)
GFR calc Af Amer: 124 mL/min/{1.73_m2} (ref >59)
GFR calc non Af Amer: 107 mL/min/{1.73_m2} (ref >59)
GLUCOSE: 85 mg/dL (ref 65–99)
Globulin, Total: 2.1 g/dL (ref 1.5–4.5)
Potassium: 4.2 mmol/L (ref 3.5–5.2)
Sodium: 138 mmol/L (ref 134–144)
TOTAL PROTEIN: 6.8 g/dL (ref 6.0–8.5)

## 2017-09-24 LAB — LIPID PANEL WITH LDL/HDL RATIO
Cholesterol, Total: 167 mg/dL (ref 100–199)
HDL: 48 mg/dL (ref >39)
LDL CALC: 107 mg/dL — AB (ref 0–99)
LDl/HDL Ratio: 2.2 ratio (ref 0.0–3.6)
Triglycerides: 61 mg/dL (ref 0–149)
VLDL CHOLESTEROL CAL: 12 mg/dL (ref 5–40)

## 2017-09-29 ENCOUNTER — Other Ambulatory Visit: Payer: Self-pay | Admitting: Family Medicine

## 2017-09-29 DIAGNOSIS — N200 Calculus of kidney: Secondary | ICD-10-CM

## 2017-10-01 NOTE — Progress Notes (Signed)
Advised  ED 

## 2017-10-08 ENCOUNTER — Encounter: Payer: Self-pay | Admitting: Internal Medicine

## 2017-10-14 IMAGING — NM NM MISC PROCEDURE
2 series · 12 of 12 positions shown · non-contrast
Comparison: none

[Series 1: wbr_s-proj_st nongated stress_(id)_sa · 6.5mm · 6.51mm/px · 6 of 64 frames shown]
[frame 6/64]
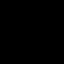
[frame 16/64]
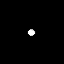
[frame 27/64]
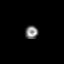
[frame 38/64]
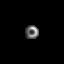
[frame 48/64]
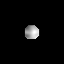
[frame 59/64]
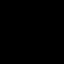

[Series 1: wbr_r-proj_st rest_(id)_sa · 6.5mm · 6.51mm/px · 6 of 64 frames shown]
[frame 6/64]
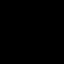
[frame 16/64]
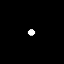
[frame 27/64]
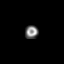
[frame 38/64]
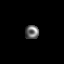
[frame 48/64]
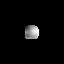
[frame 59/64]
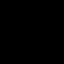

[12 of 12 positions shown; findings below may reference images not displayed]

Canned report from images found in remote index.

Refer to host system for actual result text.

## 2017-10-24 ENCOUNTER — Encounter: Payer: Self-pay | Admitting: Internal Medicine

## 2017-11-03 ENCOUNTER — Ambulatory Visit: Payer: BC Managed Care – PPO | Admitting: Internal Medicine

## 2017-11-24 ENCOUNTER — Ambulatory Visit: Payer: BC Managed Care – PPO | Admitting: Family Medicine

## 2017-11-24 ENCOUNTER — Encounter: Payer: Self-pay | Admitting: Family Medicine

## 2017-11-24 VITALS — BP 132/80 | HR 86 | Temp 97.9°F | Resp 16 | Wt 191.4 lb

## 2017-11-24 DIAGNOSIS — J0100 Acute maxillary sinusitis, unspecified: Secondary | ICD-10-CM

## 2017-11-24 MED ORDER — AMOXICILLIN-POT CLAVULANATE 875-125 MG PO TABS: 1.0000 | ORAL_TABLET | Freq: Two times a day (BID) | ORAL | 0 refills | Status: DC

## 2017-11-24 NOTE — Progress Notes (Signed)
  Subjective:     Patient ID: Jorge [name], male   DOB: [birth year], 52 y.o.   MRN: [id number] Chief Complaint  Patient presents with  . Facial Pain    Patient comes in office today with complaints of sinus pain and pressure for the past 10 days, patient states that he has tried using a sinus rinse but it caused nose bleed.    HPI Patient reports increased sinus pressure with green sinus drainage:  "I feel like it is locked in". Hx of sinus surgery in 2003. ? Whether allergies well controlled prior to onset of his sx. Review of Systems     Objective:   Physical Exam  Constitutional: He appears well-developed and well-nourished. No distress.  Ears: T.M's intact without inflammation Sinuses: mild maxillary sinus tenderness Throat: no tonsillar enlargement or exudate Neck: no cervical adenopathy Lungs: clear     Assessment:    1. Acute maxillary sinusitis, recurrence not specified - amoxicillin-clavulanate (AUGMENTIN) 875-125 MG tablet; Take 1 tablet by mouth 2 (two) times daily.  Dispense: 20 tablet; Refill: 0    Plan:    Continue Mucinex and saline irrigation Consider steroid nasal spray for improved control of allergies

## 2017-11-24 NOTE — Patient Instructions (Addendum)
Continue Mucinex and saline irrigation.

## 2017-12-17 ENCOUNTER — Ambulatory Visit: Payer: BC Managed Care – PPO | Admitting: Internal Medicine

## 2017-12-17 ENCOUNTER — Encounter: Payer: Self-pay | Admitting: Internal Medicine

## 2017-12-17 VITALS — BP 122/82 | HR 125 | Ht 70.0 in | Wt 192.6 lb

## 2017-12-17 DIAGNOSIS — I481 Persistent atrial fibrillation: Secondary | ICD-10-CM

## 2017-12-17 DIAGNOSIS — I10 Essential (primary) hypertension: Secondary | ICD-10-CM

## 2017-12-17 DIAGNOSIS — R0789 Other chest pain: Secondary | ICD-10-CM

## 2017-12-17 DIAGNOSIS — R0602 Shortness of breath: Secondary | ICD-10-CM | POA: Diagnosis not present

## 2017-12-17 DIAGNOSIS — I4819 Other persistent atrial fibrillation: Secondary | ICD-10-CM

## 2017-12-17 NOTE — H&P (View-Only) (Signed)
PCP: Jerrol Banana., MD   Primary EP: Dr Rayann Heman  Jorge [name] is a 52 y.o. male who presents today for routine electrophysiology followup.  Since last being seen in our clinic, the patient reports doing reasonably well. He has noticed increasing difficulty with shortness of breath.  He also has chest pain with activity.  He is very concerned about this.  He has also noticed increasing episodes of afib.  Today, he denies symptoms of lower extremity edema, dizziness, presyncope, or syncope.  The patient is otherwise without complaint today.   Past Medical History:  Diagnosis Date  . Atrial fibrillation ([place])    first diagnosed 2011; s/p DCCV at that time; ablation 10-2013 & 03/2014; failed multaq, flecainide and amiodarone  . Chronic anticoagulation    Xarelto  . Chronic kidney disease    H/O KIDNEY STONES  . Dysrhythmia   . Headache   . Hyperlipidemia   . Hypertension   . Sinusitis    Past Surgical History:  Procedure Laterality Date  . APPENDECTOMY  1975  . ATRIAL FIBRILLATION ABLATION N/A 11/02/2013   PVI by Dr Rayann Heman  . ATRIAL FIBRILLATION ABLATION N/A 03/29/2014   PVI and CTI by Dr Rayann Heman  . ATRIAL FIBRILLATION ABLATION N/A 07/04/2016   Procedure: Atrial Fibrillation Ablation;  Surgeon: Thompson Grayer, MD;  Location: [facility];  Service: Cardiovascular;  Laterality: N/A;  . CARDIOVERSION     Hx of  . CARDIOVERSION N/A 10/15/2013   Procedure: CARDIOVERSION;  Surgeon: Fay Records, MD;  Location: [facility];  Service: Cardiovascular;  Laterality: N/A;  . COLONOSCOPY  2016  . HERNIA REPAIR Left 05/28/2013   left inguinal henria repair, robotic direct defect repaired with Bard ultralight mesh.  Marland Kitchen HERNIA REPAIR Left 05/31/2015   Large Atrium pro loop mesh, direct defect.   . INGUINAL HERNIA REPAIR Left 05/31/2015   Procedure: HERNIA REPAIR INGUINAL ADULT;  Surgeon: Robert Bellow, MD;  Location: [facility];  Service: General;  Laterality: Left;  . KNEE SURGERY   2009   [facility]  . LITHOTRIPSY     x 3  last one  2013  . RHINOPLASTY  2006   Dr. Nadeen Landau, [facility]  . TEE WITHOUT CARDIOVERSION N/A 11/01/2013   Procedure: TRANSESOPHAGEAL ECHOCARDIOGRAM (TEE);  Surgeon: Candee Furbish, MD;  [facility]: [facility];  Service: Cardiovascular;  Laterality: N/A;  . TEE WITHOUT CARDIOVERSION N/A 03/28/2014   Procedure: TRANSESOPHAGEAL ECHOCARDIOGRAM (TEE);  Surgeon: Larey Dresser, MD;  Location: [facility];  Service: Cardiovascular;  Laterality: N/A;    ROS- all systems are reviewed and negatives except as per HPI above  Current Outpatient Medications  Medication Sig Dispense Refill  . aspirin EC 81 MG tablet Take 1 tablet (81 mg total) by mouth daily. 90 tablet 3  . atorvastatin (LIPITOR) 10 MG tablet TAKE 1 TABLET(10 MG TOTAL) BY MOUTH EVERY EVENING. 90 tablet 3  . diltiazem (CARDIZEM CD) 180 MG 24 hr capsule TAKE 1 CAPSULE (180 MG TOTAL) BY MOUTH DAILY. (Patient taking differently: TAKE 1 CAPSULE (180 MG TOTAL) BY MOUTH DAILY) 90 capsule 3  . diltiazem (CARDIZEM) 30 MG tablet TAKE ONE TO TWO TABLETS EVERY 6 HOURS AS NEEDED FOR PALPITATIONS. 30 tablet 6  . dofetilide (TIKOSYN) 500 MCG capsule Take 1 capsule (500 mcg total) by mouth 2 (two) times daily. 180 capsule 3  . Levocetirizine Dihydrochloride (XYZAL ALLERGY 24HR PO) Take 1 tablet by mouth as needed (allergies).    Marland Kitchen  tamsulosin (FLOMAX) 0.4 MG CAPS capsule Take 1 capsule (0.4 mg total) by mouth daily. 30 capsule 11  . traMADol (ULTRAM) 50 MG tablet TAKE (1) TABLET EVERY EIGHT HOURS AS NEEDED. 30 tablet 1   No current facility-administered medications for this visit.     Physical Exam: Vitals:   12/17/17 1706  BP: 122/82  Pulse: (!) 125  SpO2: 97%  Weight: 192 lb 9.6 oz (87.4 kg)  Height: 5\' 10"  (1.778 m)    GEN- The patient is well appearing, alert and oriented x 3 today.   Head- normocephalic, atraumatic Eyes-  Sclera clear, conjunctiva pink Ears- hearing  intact Oropharynx- clear Lungs- Clear to ausculation bilaterally, normal work of breathing Heart- tachycardic irregular rhythm GI- soft, NT, ND, + BS Extremities- no clubbing, cyanosis, or edema  Wt Readings from Last 3 Encounters:  12/17/17 192 lb 9.6 oz (87.4 kg)  11/24/17 191 lb 6.4 oz (86.8 kg)  09/23/17 193 lb (87.5 kg)    EKG tracing ordered today is personally reviewed and shows afib with V rates 125 bpm  Assessment and Plan:  1. Chest pain and shortness of breath Concerning for angina Prior cardiac CT revealed calcium score of 99% for age and sex Though prior myoview was low risk, I share patient's concern for ischemia. I would advise RHC, LHC to further evaluate his SOB and chest pain.  Risks and benefits to right and left heart catheterization with possible PCI to further evaluate his symptoms were discussed today.  The patient understands that risks included but are not limited to stroke (1 in 1000), death (1 in 61), kidney failure [usually temporary] (1 in 500), bleeding (1 in 200), allergic reaction [possibly serious] (1 in 200). The patient understands and agrees to proceed.  We will arrange cath at the next available time.  2. Persistent afib Increased AF is noted. With his ablation 07/04/16, there was minimal return of conduction from his PVs.  Additional ablation was performed at the SVC/RA junction.  I am concerned about non PV triggers.  Will continue current management,  Consider other options after his cath. chads2vasc score is 1.  He remains off [place].  3. HTN Stable No change required today  Return to see me in 2 weeks  Thompson Grayer MD, [facility] 12/17/2017 5:11 PM

## 2017-12-17 NOTE — Patient Instructions (Addendum)
Medication Instructions:  Your physician recommends that you continue on your current medications as directed. Please refer to the Current Medication list given to you today.  Labwork: Your physician recommends that you return for lab work in:   Monday Aug 26th for a CBC and BMP  Testing/Procedures: Your physician has requested that you have a cardiac catheterization. Cardiac catheterization is used to diagnose and/or treat various heart conditions. Doctors may recommend this procedure for a number of different reasons. The most common reason is to evaluate chest pain. Chest pain can be a symptom of coronary artery disease (CAD), and cardiac catheterization can show whether plaque is narrowing or blocking your heart's arteries. This procedure is also used to evaluate the valves, as well as measure the blood flow and oxygen levels in different parts of your heart. For further information please visit [email or web address]. Please follow instruction sheet, as given.   Follow-Up: Your physician recommends that you schedule a follow-up appointment in:   2 weeks with Dr Rayann Heman. I will have someone from scheduling call you tomorrow to arrange.   Any Other Special Instructions Will Be Listed Below (If Applicable).     If you need a refill on your cardiac medications before your next appointment, please call your pharmacy.     [facility] [place], [place] [address] Dept: [place]: [place]  12/17/2017  You are scheduled for a Cardiac Catheterization on Tuesday, August 27 with Dr. Lauree Chandler.  1. Please arrive at the [facility] (Main Entrance A) at [facility]: [address] [place], [place] [address] at 7:00 AM (This time is two hours before your procedure to ensure your preparation). Free valet parking service is available.    Special note: Every effort is made to have your procedure done on time. Please understand that emergencies sometimes delay scheduled procedures.  2. Diet: Do not eat solid foods after midnight.  The patient may have clear liquids until 5am upon the day of the procedure.  3. Labs: You will need to have blood drawn on Monday, August 26 at [facility] [address]  Open: 7:30am - 5pm    Phone: [phone]. You do not need to be fasting.  4. Medication instructions in preparation for your procedure:  On the morning of your procedure, take your Aspirin and any morning medicines NOT listed above.  You may use sips of water.  5. Plan for one night stay--bring personal belongings. 6. Bring a current list of your medications and current insurance cards. 7. You MUST have a responsible person to drive you home. 8. Someone MUST be with you the first 24 hours after you arrive home or your discharge will be delayed. 9. Please wear clothes that are easy to get on and off and wear slip-on shoes.  Thank you for allowing [place] to care for you!   -- [facility]

## 2017-12-17 NOTE — Progress Notes (Signed)
PCP: Jerrol Banana., MD   Primary EP: Dr Rayann Heman  [place] Jorge [name] is a 52 y.o. male who presents today for routine electrophysiology followup.  Since last being seen in our clinic, the patient reports doing reasonably well. He has noticed increasing difficulty with shortness of breath.  He also has chest pain with activity.  He is very concerned about this.  He has also noticed increasing episodes of afib.  Today, he denies symptoms of lower extremity edema, dizziness, presyncope, or syncope.  The patient is otherwise without complaint today.   Past Medical History:  Diagnosis Date  . Atrial fibrillation ([place])    first diagnosed 2011; s/p DCCV at that time; ablation 10-2013 & 03/2014; failed multaq, flecainide and amiodarone  . Chronic anticoagulation    Xarelto  . Chronic kidney disease    H/O KIDNEY STONES  . Dysrhythmia   . Headache   . Hyperlipidemia   . Hypertension   . Sinusitis    Past Surgical History:  Procedure Laterality Date  . APPENDECTOMY  1975  . ATRIAL FIBRILLATION ABLATION N/A 11/02/2013   PVI by Dr Rayann Heman  . ATRIAL FIBRILLATION ABLATION N/A 03/29/2014   PVI and CTI by Dr Rayann Heman  . ATRIAL FIBRILLATION ABLATION N/A 07/04/2016   Procedure: Atrial Fibrillation Ablation;  Surgeon: Thompson Grayer, MD;  Location: [place];  Service: Cardiovascular;  Laterality: N/A;  . CARDIOVERSION     Hx of  . CARDIOVERSION N/A 10/15/2013   Procedure: CARDIOVERSION;  Surgeon: Fay Records, MD;  Location: [facility];  Service: Cardiovascular;  Laterality: N/A;  . COLONOSCOPY  2016  . HERNIA REPAIR Left 05/28/2013   left inguinal henria repair, robotic direct defect repaired with Bard ultralight mesh.  Marland Kitchen HERNIA REPAIR Left 05/31/2015   Large Atrium pro loop mesh, direct defect.   . INGUINAL HERNIA REPAIR Left 05/31/2015   Procedure: HERNIA REPAIR INGUINAL ADULT;  Surgeon: Robert Bellow, MD;  Location: [facility] ORS;  Service: General;  Laterality: Left;  . KNEE SURGERY   2009   [facility]  . LITHOTRIPSY     x 3  last one  2013  . RHINOPLASTY  2006   Dr. Nadeen Landau, [facility]  . TEE WITHOUT CARDIOVERSION N/A 11/01/2013   Procedure: TRANSESOPHAGEAL ECHOCARDIOGRAM (TEE);  Surgeon: Candee Furbish, MD;  Location: [facility] ENDOSCOPY;  Service: Cardiovascular;  Laterality: N/A;  . TEE WITHOUT CARDIOVERSION N/A 03/28/2014   Procedure: TRANSESOPHAGEAL ECHOCARDIOGRAM (TEE);  Surgeon: Larey Dresser, MD;  Location: [facility];  Service: Cardiovascular;  Laterality: N/A;    ROS- all systems are reviewed and negatives except as per HPI above  Current Outpatient Medications  Medication Sig Dispense Refill  . aspirin EC 81 MG tablet Take 1 tablet (81 mg total) by mouth daily. 90 tablet 3  . atorvastatin (LIPITOR) 10 MG tablet TAKE 1 TABLET(10 MG TOTAL) BY MOUTH EVERY EVENING. 90 tablet 3  . diltiazem (CARDIZEM CD) 180 MG 24 hr capsule TAKE 1 CAPSULE (180 MG TOTAL) BY MOUTH DAILY. (Patient taking differently: TAKE 1 CAPSULE (180 MG TOTAL) BY MOUTH DAILY) 90 capsule 3  . diltiazem (CARDIZEM) 30 MG tablet TAKE ONE TO TWO TABLETS EVERY 6 HOURS AS NEEDED FOR PALPITATIONS. 30 tablet 6  . dofetilide (TIKOSYN) 500 MCG capsule Take 1 capsule (500 mcg total) by mouth 2 (two) times daily. 180 capsule 3  . Levocetirizine Dihydrochloride (XYZAL ALLERGY 24HR PO) Take 1 tablet by mouth as needed (allergies).    Marland Kitchen  tamsulosin (FLOMAX) 0.4 MG CAPS capsule Take 1 capsule (0.4 mg total) by mouth daily. 30 capsule 11  . traMADol (ULTRAM) 50 MG tablet TAKE (1) TABLET EVERY EIGHT HOURS AS NEEDED. 30 tablet 1   No current facility-administered medications for this visit.     Physical Exam: Vitals:   12/17/17 1706  BP: 122/82  Pulse: (!) 125  SpO2: 97%  Weight: 192 lb 9.6 oz (87.4 kg)  Height: 5\' 10"  (1.778 m)    GEN- The patient is well appearing, alert and oriented x 3 today.   Head- normocephalic, atraumatic Eyes-  Sclera clear, conjunctiva pink Ears- hearing  intact Oropharynx- clear Lungs- Clear to ausculation bilaterally, normal work of breathing Heart- tachycardic irregular rhythm GI- soft, NT, ND, + BS Extremities- no clubbing, cyanosis, or edema  Wt Readings from Last 3 Encounters:  12/17/17 192 lb 9.6 oz (87.4 kg)  11/24/17 191 lb 6.4 oz (86.8 kg)  09/23/17 193 lb (87.5 kg)    EKG tracing ordered today is personally reviewed and shows afib with V rates 125 bpm  Assessment and Plan:  1. Chest pain and shortness of breath Concerning for angina Prior cardiac CT revealed calcium score of 99% for age and sex Though prior myoview was low risk, I share patient's concern for ischemia. I would advise RHC, LHC to further evaluate his SOB and chest pain.  Risks and benefits to right and left heart catheterization with possible PCI to further evaluate his symptoms were discussed today.  The patient understands that risks included but are not limited to stroke (1 in 1000), death (1 in 52), kidney failure [usually temporary] (1 in 500), bleeding (1 in 200), allergic reaction [possibly serious] (1 in 200). The patient understands and agrees to proceed.  We will arrange cath at the next available time.  2. Persistent afib Increased AF is noted. With his ablation 07/04/16, there was minimal return of conduction from his PVs.  Additional ablation was performed at the SVC/RA junction.  I am concerned about non PV triggers.  Will continue current management,  Consider other options after his cath. chads2vasc score is 1.  He remains off [place].  3. HTN Stable No change required today  Return to see me in 2 weeks  Thompson Grayer MD, [facility] 12/17/2017 5:11 PM

## 2017-12-19 NOTE — Addendum Note (Signed)
Addended by: Rose Phi on: 12/19/2017 11:50 AM   Modules accepted: Orders

## 2017-12-22 ENCOUNTER — Telehealth: Payer: Self-pay | Admitting: *Deleted

## 2017-12-22 ENCOUNTER — Other Ambulatory Visit: Payer: BC Managed Care – PPO | Admitting: *Deleted

## 2017-12-22 DIAGNOSIS — R0789 Other chest pain: Secondary | ICD-10-CM

## 2017-12-22 DIAGNOSIS — I10 Essential (primary) hypertension: Secondary | ICD-10-CM

## 2017-12-22 DIAGNOSIS — I4819 Other persistent atrial fibrillation: Secondary | ICD-10-CM

## 2017-12-22 DIAGNOSIS — R0602 Shortness of breath: Secondary | ICD-10-CM

## 2017-12-22 LAB — BASIC METABOLIC PANEL
BUN / CREAT RATIO: 17 (ref 9–20)
BUN: 16 mg/dL (ref 6–24)
CO2: 25 mmol/L (ref 20–29)
CREATININE: 0.92 mg/dL (ref 0.76–1.27)
Calcium: 9.7 mg/dL (ref 8.7–10.2)
Chloride: 102 mmol/L (ref 96–106)
GFR calc Af Amer: 110 mL/min/{1.73_m2} (ref >59)
GFR calc non Af Amer: 95 mL/min/{1.73_m2} (ref >59)
GLUCOSE: 99 mg/dL (ref 65–99)
POTASSIUM: 3.9 mmol/L (ref 3.5–5.2)
SODIUM: 140 mmol/L (ref 134–144)

## 2017-12-22 LAB — CBC
Hematocrit: 38 % (ref 37.5–51.0)
Hemoglobin: 13.5 g/dL (ref 13.0–17.7)
MCH: 30.9 pg (ref 26.6–33.0)
MCHC: 35.5 g/dL (ref 31.5–35.7)
MCV: 87 fL (ref 79–97)
PLATELETS: 238 10*3/uL (ref 150–450)
RBC: 4.37 x10E6/uL (ref 4.14–5.80)
RDW: 13.6 % (ref 12.3–15.4)
WBC: 5.5 10*3/uL (ref 3.4–10.8)

## 2017-12-22 NOTE — Telephone Encounter (Signed)
Pt contacted pre-catheterization scheduled at [facility] for: Tuesday December 23, 2017 9 AM Verified arrival time and place: [facility] Entrance A at: 7 AM  No solid food after midnight prior to cath, clear liquids until 5 AM day of procedure. Verified allergies in Epic Verified no diabetes medications.  AM meds can be  taken pre-cath with sip of water including: ASA 81 mg  Confirmed patient has responsible person to drive home post procedure and for 24 hours after you arrive home: yes

## 2017-12-23 ENCOUNTER — Encounter (HOSPITAL_COMMUNITY): Payer: Self-pay | Admitting: *Deleted

## 2017-12-23 ENCOUNTER — Other Ambulatory Visit: Payer: Self-pay

## 2017-12-23 ENCOUNTER — Encounter (HOSPITAL_COMMUNITY)
Admission: RE | Disposition: A | Discharge status: Home / Self Care | Payer: Self-pay | Source: Ambulatory Visit | Attending: Cardiovascular Disease

## 2017-12-23 ENCOUNTER — Ambulatory Visit (HOSPITAL_COMMUNITY)
Admission: RE | Admit: 2017-12-23 | Discharge: 2017-12-23 | Disposition: A | Discharge status: Home / Self Care | Payer: BC Managed Care – PPO | Source: Ambulatory Visit | Attending: Cardiovascular Disease | Admitting: Cardiovascular Disease

## 2017-12-23 DIAGNOSIS — Z87442 Personal history of urinary calculi: Secondary | ICD-10-CM | POA: Diagnosis not present

## 2017-12-23 DIAGNOSIS — Z79899 Other long term (current) drug therapy: Secondary | ICD-10-CM | POA: Insufficient documentation

## 2017-12-23 DIAGNOSIS — Z7982 Long term (current) use of aspirin: Secondary | ICD-10-CM | POA: Insufficient documentation

## 2017-12-23 DIAGNOSIS — Z955 Presence of coronary angioplasty implant and graft: Secondary | ICD-10-CM

## 2017-12-23 DIAGNOSIS — I481 Persistent atrial fibrillation: Secondary | ICD-10-CM | POA: Insufficient documentation

## 2017-12-23 DIAGNOSIS — Z9889 Other specified postprocedural states: Secondary | ICD-10-CM | POA: Insufficient documentation

## 2017-12-23 DIAGNOSIS — Z7901 Long term (current) use of anticoagulants: Secondary | ICD-10-CM | POA: Insufficient documentation

## 2017-12-23 DIAGNOSIS — E785 Hyperlipidemia, unspecified: Secondary | ICD-10-CM | POA: Insufficient documentation

## 2017-12-23 DIAGNOSIS — I200 Unstable angina: Secondary | ICD-10-CM | POA: Diagnosis present

## 2017-12-23 DIAGNOSIS — I25110 Atherosclerotic heart disease of native coronary artery with unstable angina pectoris: Secondary | ICD-10-CM | POA: Diagnosis not present

## 2017-12-23 DIAGNOSIS — I129 Hypertensive chronic kidney disease with stage 1 through stage 4 chronic kidney disease, or unspecified chronic kidney disease: Secondary | ICD-10-CM | POA: Diagnosis not present

## 2017-12-23 DIAGNOSIS — N189 Chronic kidney disease, unspecified: Secondary | ICD-10-CM | POA: Insufficient documentation

## 2017-12-23 HISTORY — PX: CORONARY STENT INTERVENTION: CATH118234

## 2017-12-23 HISTORY — DX: Presence of coronary angioplasty implant and graft: Z95.5

## 2017-12-23 HISTORY — PX: RIGHT/LEFT HEART CATH AND CORONARY ANGIOGRAPHY: CATH118266

## 2017-12-23 LAB — POCT I-STAT 3, VENOUS BLOOD GAS (G3P V)
Acid-base deficit: 2 mmol/L (ref 0.0–2.0)
Bicarbonate: 22.9 mmol/L (ref 20.0–28.0)
O2 SAT: 79 %
PCO2 VEN: 41 mmHg — AB (ref 44.0–60.0)
PH VEN: 7.355 (ref 7.250–7.430)
TCO2: 24 mmol/L (ref 22–32)
pO2, Ven: 45 mmHg (ref 32.0–45.0)

## 2017-12-23 LAB — POCT I-STAT 3, ART BLOOD GAS (G3+)
Acid-base deficit: 2 mmol/L (ref 0.0–2.0)
Bicarbonate: 23.3 mmol/L (ref 20.0–28.0)
O2 SAT: 96 %
PCO2 ART: 41.1 mmHg (ref 32.0–48.0)
PH ART: 7.362 (ref 7.350–7.450)
TCO2: 25 mmol/L (ref 22–32)
pO2, Arterial: 83 mmHg (ref 83.0–108.0)

## 2017-12-23 LAB — POCT ACTIVATED CLOTTING TIME
ACTIVATED CLOTTING TIME: 246 s
ACTIVATED CLOTTING TIME: 417 s
Activated Clotting Time: >1000 seconds

## 2017-12-23 SURGERY — RIGHT/LEFT HEART CATH AND CORONARY ANGIOGRAPHY
Anesthesia: LOCAL

## 2017-12-23 MED ORDER — CLOPIDOGREL BISULFATE 300 MG PO TABS
ORAL_TABLET | ORAL | Status: DC | PRN
  Administered 2017-12-23: 600 mg via ORAL

## 2017-12-23 MED ORDER — DILTIAZEM HCL ER COATED BEADS 180 MG PO CP24: 180.0000 mg | ORAL_CAPSULE | Freq: Every day | ORAL | Status: DC

## 2017-12-23 MED ORDER — ATORVASTATIN CALCIUM 80 MG PO TABS: 80.0000 mg | ORAL_TABLET | Freq: Every day | ORAL | 1 refills | Status: DC

## 2017-12-23 MED ORDER — HEPARIN (PORCINE) IN NACL 1000-0.9 UT/500ML-% IV SOLN
INTRAVENOUS | Status: AC
  Filled 2017-12-23: qty 1000

## 2017-12-23 MED ORDER — HEPARIN SODIUM (PORCINE) 1000 UNIT/ML IJ SOLN
INTRAMUSCULAR | Status: DC | PRN
  Administered 2017-12-23: 6500 [IU] via INTRAVENOUS
  Administered 2017-12-23: 3000 [IU] via INTRAVENOUS
  Administered 2017-12-23: 4500 [IU] via INTRAVENOUS

## 2017-12-23 MED ORDER — LIDOCAINE HCL (PF) 1 % IJ SOLN
INTRAMUSCULAR | Status: AC
  Filled 2017-12-23: qty 30

## 2017-12-23 MED ORDER — MIDAZOLAM HCL 2 MG/2ML IJ SOLN
INTRAMUSCULAR | Status: AC
  Filled 2017-12-23: qty 2

## 2017-12-23 MED ORDER — FAMOTIDINE IN NACL 20-0.9 MG/50ML-% IV SOLN
INTRAVENOUS | Status: DC | PRN
  Administered 2017-12-23: 20 mg via INTRAVENOUS

## 2017-12-23 MED ORDER — SODIUM CHLORIDE 0.9 % IV SOLN: INTRAVENOUS | Status: AC

## 2017-12-23 MED ORDER — ASPIRIN 81 MG PO CHEW: 81.0000 mg | CHEWABLE_TABLET | ORAL | Status: DC

## 2017-12-23 MED ORDER — CLOPIDOGREL BISULFATE 75 MG PO TABS: 75.0000 mg | ORAL_TABLET | Freq: Every day | ORAL | Status: DC

## 2017-12-23 MED ORDER — SODIUM CHLORIDE 0.9 % WEIGHT BASED INFUSION: 1.0000 mL/kg/h | INTRAVENOUS | Status: DC

## 2017-12-23 MED ORDER — ACETAMINOPHEN 500 MG PO TABS
1000.0000 mg | ORAL_TABLET | Freq: Once | ORAL | Status: AC
  Administered 2017-12-23: 1000 mg via ORAL
  Filled 2017-12-23 (×2): qty 2

## 2017-12-23 MED ORDER — SODIUM CHLORIDE 0.9 % IV SOLN
INTRAVENOUS | Status: AC | PRN
  Administered 2017-12-23: 10 mL/h via INTRAVENOUS

## 2017-12-23 MED ORDER — FENTANYL CITRATE (PF) 100 MCG/2ML IJ SOLN
INTRAMUSCULAR | Status: AC
  Filled 2017-12-23: qty 2

## 2017-12-23 MED ORDER — FENTANYL CITRATE (PF) 100 MCG/2ML IJ SOLN
INTRAMUSCULAR | Status: DC | PRN
  Administered 2017-12-23 (×2): 25 ug via INTRAVENOUS
  Administered 2017-12-23: 50 ug via INTRAVENOUS

## 2017-12-23 MED ORDER — HEPARIN SODIUM (PORCINE) 1000 UNIT/ML IJ SOLN
INTRAMUSCULAR | Status: AC
  Filled 2017-12-23: qty 1

## 2017-12-23 MED ORDER — NITROGLYCERIN 1 MG/10 ML FOR IR/CATH LAB
INTRA_ARTERIAL | Status: AC
  Filled 2017-12-23: qty 10

## 2017-12-23 MED ORDER — VERAPAMIL HCL 2.5 MG/ML IV SOLN
INTRAVENOUS | Status: AC
  Filled 2017-12-23: qty 2

## 2017-12-23 MED ORDER — ONDANSETRON HCL 4 MG/2ML IJ SOLN: 4.0000 mg | Freq: Four times a day (QID) | INTRAMUSCULAR | Status: DC | PRN

## 2017-12-23 MED ORDER — VERAPAMIL HCL 2.5 MG/ML IV SOLN
INTRAVENOUS | Status: DC | PRN
  Administered 2017-12-23: 10 mL via INTRA_ARTERIAL

## 2017-12-23 MED ORDER — CLOPIDOGREL BISULFATE 300 MG PO TABS
ORAL_TABLET | ORAL | Status: AC
  Filled 2017-12-23: qty 2

## 2017-12-23 MED ORDER — IOPAMIDOL (ISOVUE-370) INJECTION 76%
INTRAVENOUS | Status: AC
  Filled 2017-12-23: qty 100

## 2017-12-23 MED ORDER — CLOPIDOGREL BISULFATE 75 MG PO TABS: 75.0000 mg | ORAL_TABLET | Freq: Every day | ORAL | 0 refills | Status: DC

## 2017-12-23 MED ORDER — HEPARIN (PORCINE) IN NACL 1000-0.9 UT/500ML-% IV SOLN
INTRAVENOUS | Status: DC | PRN
  Administered 2017-12-23 (×2): 500 mL

## 2017-12-23 MED ORDER — NITROGLYCERIN 1 MG/10 ML FOR IR/CATH LAB
INTRA_ARTERIAL | Status: DC | PRN
  Administered 2017-12-23: 200 ug via INTRACORONARY

## 2017-12-23 MED ORDER — IOPAMIDOL (ISOVUE-370) INJECTION 76%
INTRAVENOUS | Status: DC | PRN
  Administered 2017-12-23: 180 mL via INTRA_ARTERIAL

## 2017-12-23 MED ORDER — TAMSULOSIN HCL 0.4 MG PO CAPS: 0.4000 mg | ORAL_CAPSULE | Freq: Every day | ORAL | Status: DC

## 2017-12-23 MED ORDER — NITROGLYCERIN 0.4 MG SL SUBL: 0.4000 mg | SUBLINGUAL_TABLET | SUBLINGUAL | 2 refills | Status: DC | PRN

## 2017-12-23 MED ORDER — MIDAZOLAM HCL 2 MG/2ML IJ SOLN
INTRAMUSCULAR | Status: DC | PRN
  Administered 2017-12-23 (×2): 1 mg via INTRAVENOUS
  Administered 2017-12-23: 2 mg via INTRAVENOUS

## 2017-12-23 MED ORDER — SODIUM CHLORIDE 0.9% FLUSH: 3.0000 mL | INTRAVENOUS | Status: DC | PRN

## 2017-12-23 MED ORDER — SODIUM CHLORIDE 0.9 % IV SOLN: 250.0000 mL | INTRAVENOUS | Status: DC | PRN

## 2017-12-23 MED ORDER — ATORVASTATIN CALCIUM 80 MG PO TABS: 80.0000 mg | ORAL_TABLET | Freq: Every day | ORAL | Status: DC

## 2017-12-23 MED ORDER — SODIUM CHLORIDE 0.9 % WEIGHT BASED INFUSION
3.0000 mL/kg/h | INTRAVENOUS | Status: AC
  Administered 2017-12-23: 3 mL/kg/h via INTRAVENOUS

## 2017-12-23 MED ORDER — LIDOCAINE HCL (PF) 1 % IJ SOLN
INTRAMUSCULAR | Status: DC | PRN
  Administered 2017-12-23: 4 mL via INTRADERMAL
  Administered 2017-12-23: 2 mL via INTRADERMAL

## 2017-12-23 MED ORDER — HYDRALAZINE HCL 20 MG/ML IJ SOLN: 5.0000 mg | INTRAMUSCULAR | Status: AC | PRN

## 2017-12-23 MED ORDER — SODIUM CHLORIDE 0.9% FLUSH: 3.0000 mL | Freq: Two times a day (BID) | INTRAVENOUS | Status: DC

## 2017-12-23 MED ORDER — FAMOTIDINE IN NACL 20-0.9 MG/50ML-% IV SOLN
INTRAVENOUS | Status: AC
  Filled 2017-12-23: qty 50

## 2017-12-23 MED ORDER — DOFETILIDE 500 MCG PO CAPS: 500.0000 ug | ORAL_CAPSULE | Freq: Two times a day (BID) | ORAL | Status: DC

## 2017-12-23 MED ORDER — LABETALOL HCL 5 MG/ML IV SOLN: 10.0000 mg | INTRAVENOUS | Status: AC | PRN

## 2017-12-23 MED ORDER — ASPIRIN EC 81 MG PO TBEC: 81.0000 mg | DELAYED_RELEASE_TABLET | Freq: Every day | ORAL | Status: DC

## 2017-12-23 MED FILL — CLOPIDOGREL 75 MG TABLET: 75 | 30 days supply | Qty: 30 | Fill #0

## 2017-12-23 SURGICAL SUPPLY — 20 items
BALLN SAPPHIRE 2.5X12 (BALLOONS) ×2
BALLN ~~LOC~~ EMERGE MR 3.25X6 (BALLOONS) ×2
BALLOON SAPPHIRE 2.5X12 (BALLOONS) ×1 IMPLANT
BALLOON ~~LOC~~ EMERGE MR 3.25X6 (BALLOONS) ×1 IMPLANT
CATH 5FR JL3.5 JR4 ANG PIG MP (CATHETERS) ×2 IMPLANT
CATH BALLN WEDGE 5F 110CM (CATHETERS) ×2 IMPLANT
CATH VISTA GUIDE 6FR XBLAD3.5 (CATHETERS) ×2 IMPLANT
DEVICE RAD COMP TR BAND LRG (VASCULAR PRODUCTS) ×2 IMPLANT
GLIDESHEATH SLEND SS 6F .021 (SHEATH) ×2 IMPLANT
GUIDEWIRE INQWIRE 1.5J.035X260 (WIRE) ×1 IMPLANT
INQWIRE 1.5J .035X260CM (WIRE) ×2
KIT ENCORE 26 ADVANTAGE (KITS) ×2 IMPLANT
KIT HEART LEFT (KITS) ×2 IMPLANT
PACK CARDIAC CATHETERIZATION (CUSTOM PROCEDURE TRAY) ×2 IMPLANT
SHEATH GLIDE SLENDER 4/5FR (SHEATH) ×2 IMPLANT
STENT RESOLUTE ONYX 3.0X12 (Permanent Stent) ×2 IMPLANT
SYR MEDRAD MARK V 150ML (SYRINGE) ×2 IMPLANT
TRANSDUCER W/STOPCOCK (MISCELLANEOUS) ×2 IMPLANT
TUBING CIL FLEX 10 FLL-RA (TUBING) ×2 IMPLANT
WIRE COUGAR XT STRL 190CM (WIRE) ×2 IMPLANT

## 2017-12-23 NOTE — Discharge Instructions (Signed)
Radial Site Care Refer to this sheet in the next few weeks. These instructions provide you with information about caring for yourself after your procedure. Your health care provider may also give you more specific instructions. Your treatment has been planned according to current medical practices, but problems sometimes occur. Call your health care provider if you have any problems or questions after your procedure. What can I expect after the procedure? After your procedure, it is typical to have the following:  Bruising at the radial site that usually fades within 1-2 weeks.  Blood collecting in the tissue (hematoma) that may be painful to the touch. It should usually decrease in size and tenderness within 1-2 weeks.  Follow these instructions at home:  Take medicines only as directed by your health care provider.  You may shower 24-48 hours after the procedure or as directed by your health care provider. Remove the bandage (dressing) and gently wash the site with plain soap and water. Pat the area dry with a clean towel. Do not rub the site, because this may cause bleeding.  Do not take baths, swim, or use a hot tub until your health care provider approves.  Check your insertion site every day for redness, swelling, or drainage.  Do not apply powder or lotion to the site.  Do not flex or bend the affected arm for 24 hours or as directed by your health care provider.  Do not push or pull heavy objects with the affected arm for 24 hours or as directed by your health care provider.  Do not lift over 10 lb (4.5 kg) for 5 days after your procedure or as directed by your health care provider.  Ask your health care provider when it is okay to: ? Return to work or school. ? Resume usual physical activities or sports. ? Resume sexual activity.  Do not drive home if you are discharged the same day as the procedure. Have someone else drive you.  You may drive 24 hours after the procedure  unless otherwise instructed by your health care provider.  Do not operate machinery or power tools for 24 hours after the procedure.  If your procedure was done as an outpatient procedure, which means that you went home the same day as your procedure, a responsible adult should be with you for the first 24 hours after you arrive home.  Keep all follow-up visits as directed by your health care provider. This is important. Contact a health care provider if:  You have a fever.  You have chills.  You have increased bleeding from the radial site. Hold pressure on the site. Get help right away if:  You have unusual pain at the radial site.  You have redness, warmth, or swelling at the radial site.  You have drainage (other than a small amount of blood on the dressing) from the radial site.  The radial site is bleeding, and the bleeding does not stop after 30 minutes of holding steady pressure on the site.  Your arm or hand becomes pale, cool, tingly, or numb. This information is not intended to replace advice given to you by your health care provider. Make sure you discuss any questions you have with your health care provider. Document Released: 05/18/2010 Document Revised: 09/21/2015 Document Reviewed: 11/01/2013 Elsevier Interactive Patient Education  2018 [place]. Moderate Conscious Sedation, Adult, Care After These instructions provide you with information about caring for yourself after your procedure. Your health care provider may also give  you more specific instructions. Your treatment has been planned according to current medical practices, but problems sometimes occur. Call your health care provider if you have any problems or questions after your procedure. What can I expect after the procedure? After your procedure, it is common:  To feel sleepy for several hours.  To feel clumsy and have poor balance for several hours.  To have poor judgment for several hours.  To  vomit if you eat too soon.  Follow these instructions at home: For at least 24 hours after the procedure:   Do not: ? Participate in activities where you could fall or become injured. ? Drive. ? Use heavy machinery. ? Drink alcohol. ? Take sleeping pills or medicines that cause drowsiness. ? Make important decisions or sign legal documents. ? Take care of children on your own.  Rest. Eating and drinking  Follow the diet recommended by your health care provider.  If you vomit: ? Drink water, juice, or soup when you can drink without vomiting. ? Make sure you have little or no nausea before eating solid foods. General instructions  Have a responsible adult stay with you until you are awake and alert.  Take over-the-counter and prescription medicines only as told by your health care provider.  If you smoke, do not smoke without supervision.  Keep all follow-up visits as told by your health care provider. This is important. Contact a health care provider if:  You keep feeling nauseous or you keep vomiting.  You feel light-headed.  You develop a rash.  You have a fever. Get help right away if:  You have trouble breathing. This information is not intended to replace advice given to you by your health care provider. Make sure you discuss any questions you have with your health care provider. Document Released: 02/03/2013 Document Revised: 09/18/2015 Document Reviewed: 08/05/2015 Elsevier Interactive Patient Education  2018 [place] about your medication: Plavix (anti-platelet agent)  Generic Name (Brand): clopidogrel (Plavix), once daily medication  PURPOSE: You are taking this medication along with aspirin to lower your chance of having a heart attack, stroke, or blood clots in your heart stent. These can be fatal. Brilinta and aspirin help prevent platelets from sticking together and forming a clot that can block an artery or your stent.   Common SIDE  EFFECTS you may experience include: bruising or bleeding more easily, shortness of breath  Do not stop taking PLAVIX without talking to the doctor who prescribes it for you. People who are treated with a stent and stop taking Plavix too soon, have a higher risk of getting a blood clot in the stent, having a heart attack, or dying. If you stop Plavix because of bleeding, or for other reasons, your risk of a heart attack or stroke may increase.   Tell all of your doctors and dentists that you are taking Plavix. They should talk to the doctor who prescribed Brilinta for you before you have any surgery or invasive procedure.   Contact your health care provider if you experience: severe or uncontrollable bleeding, pink/red/brown urine, vomiting blood or vomit that looks like "coffee grounds", red or black stools (looks like tar), coughing up blood or blood clots ----------------------------------------------------------------------------------------------------------------------

## 2017-12-23 NOTE — Progress Notes (Signed)
Ed completed with pt and wife. Good reception. Understands need for Plavix/ASA, diet and exercise. Will refer to [place] CRPII to help him transition back to his normal exercise. He is not very interested in carrying NTG but we discussed it. [place], ACSM 1:52 PM 12/23/2017

## 2017-12-23 NOTE — Interval H&P Note (Signed)
History and Physical Interval Note:  12/23/2017 8:39 AM  [name]  has presented today for cardiac cath with the diagnosis of unstable angina  The various methods of treatment have been discussed with the patient and family. After consideration of risks, benefits and other options for treatment, the patient has consented to  Procedure(s): RIGHT/LEFT HEART CATH AND CORONARY ANGIOGRAPHY (N/A) as a surgical intervention .  The patient's history has been reviewed, patient examined, no change in status, stable for surgery.  I have reviewed the patient's chart and labs.  Questions were answered to the patient's satisfaction.    Cath Lab Visit (complete for each Cath Lab visit)  Clinical Evaluation Leading to the Procedure:   ACS: No.  Non-ACS:    Anginal Classification: CCS III  Anti-ischemic medical therapy: Minimal Therapy (1 class of medications)  Non-Invasive Test Results: No non-invasive testing performed  Prior CABG: No previous CABG         Jorge [name]

## 2017-12-23 NOTE — Discharge Summary (Signed)
Discharge Summary    Patient ID: Jorge [name],  MRN: [id number], DOB/AGE: Jun 03, 1965 52 y.o.  Admit date: 12/23/2017 Discharge date: 12/23/2017  Primary Care Provider: Jerrol [name]. Primary Cardiologist: Dr. Rayann [name]  Discharge Diagnoses    Active Problems:   Unstable angina (Jorge [name])   Allergies Allergies  Allergen Reactions  . Oxycodone Nausea And Vomiting  . Vicodin [Hydrocodone-Acetaminophen] Nausea And Vomiting    Can take regular Tylenol    Diagnostic Studies/Procedures    Cath: 12/23/17   Mid RCA lesion is 30% stenosed.  Post Atrio lesion is 30% stenosed.  Prox Cx lesion is 30% stenosed.  Ost 2nd Mrg lesion is 30% stenosed.  Prox LAD lesion is 80% stenosed.  Mid Cx lesion is 50% stenosed.  Ost 1st Diag lesion is 20% stenosed.  A drug-eluting stent was successfully placed using a STENT RESOLUTE ONYX 3.0X12.  Post intervention, there is a 0% residual stenosis.  The left ventricular systolic function is normal.  LV end diastolic pressure is normal.  The left ventricular ejection fraction is 50-55% by visual estimate.  There is no mitral valve regurgitation.   1. Severe stenosis mid LAD just after a large Diagonal branch 2. Successful PTCA/DES x 1 mid LAD 3. Moderate non-obstructive disease in the Circumflex and obtuse marginal branch 4. Mild disease in the large dominant RCA 5. Normal LV systolic function  Recommendations: Same day PCI discharge. Will increase Lipitor to 80 mg daily. Will need Lipids and LFTs in 12 weeks.  Recommend uninterrupted dual antiplatelet therapy with Aspirin 81mg  daily and Clopidogrel 75mg  daily for a minimum of 6 months (stable ischemic heart disease - Class I recommendation). If he tolerates DAPT, would continue for one full year.  _____________   History of Present Illness     52 y.o. male with PMH of PAF, HTN, HL, CKD who presented to the office on 12/17/17 for routine electrophysiology followup.  Since  last being seen in our clinic, the patient reported doing reasonably well. He had noticed increasing difficulty with shortness of breath.  He also had chest pain with activity.  He was very concerned about this.  He had also noticed increasing episodes of afib. At this appt, he denied symptoms of lower extremity edema, dizziness, presyncope, or syncope. Given symptoms he was referred for cardiac cath.   Hospital Course     Underwent cardiac cath noted above with severe stenosis in the mLAD with successful PCI/DES x1 to the mLAD. Moderate non-obstructive disease in the LCx and OM. Normal LV function. Plan for DAPT with ASA/plavix for at least 6 months. Also increased his Lipitor to 80mg  post cath. Seen by cardiac rehab while in short stay. Instructions/precautions given prior to discharge.   Jorge [name] was seen by Jorge [name] and determined stable for discharge home. Follow up in the office has been arranged. Medications are listed below.   _____________  Discharge Vitals Blood pressure 134/83, pulse 65, temperature 97.9 F (36.6 C), temperature source Oral, resp. rate 15, height 5\' 10"  (1.778 m), weight 86.2 kg, SpO2 99 %.  Filed Weights   12/23/17 0712  Weight: 86.2 kg    Labs & Radiologic Studies    CBC Recent Labs    12/22/17 0853  WBC 5.5  HGB 13.5  HCT 38.0  MCV 87  PLT 443   Basic Metabolic Panel Recent Labs    12/22/17 0853  NA 140  K 3.9  CL 102  CO2  25  GLUCOSE 99  BUN 16  CREATININE 0.92  CALCIUM 9.7   Liver Function Tests No results for input(s): AST, ALT, ALKPHOS, BILITOT, PROT, ALBUMIN in the last 72 hours. No results for input(s): LIPASE, AMYLASE in the last 72 hours. Cardiac Enzymes No results for input(s): CKTOTAL, CKMB, CKMBINDEX, TROPONINI in the last 72 hours. BNP Invalid input(s): POCBNP D-Dimer No results for input(s): DDIMER in the last 72 hours. Hemoglobin A1C No results for input(s): HGBA1C in the last 72 hours. Fasting Lipid  Panel No results for input(s): CHOL, HDL, LDLCALC, TRIG, CHOLHDL, LDLDIRECT in the last 72 hours. Thyroid Function Tests No results for input(s): TSH, T4TOTAL, T3FREE, THYROIDAB in the last 72 hours.  Invalid input(s): FREET3 _____________  No results found. Disposition   Pt is being discharged home today in good condition.  Follow-up Plans & Appointments    Follow-up Information    Jorge Grayer, MD Follow up on 01/07/2018.   Specialty:  Cardiology Why:  at 9am for your follow up appt.  Contact information: [place] [address] [phone]          Discharge Instructions    Amb Referral to Cardiac Rehabilitation   Complete by:  As directed    Diagnosis:   Coronary Stents PTCA       Discharge Medications     Medication List    TAKE these medications   aspirin EC 81 MG tablet Take 1 tablet (81 mg total) by mouth daily.   atorvastatin 80 MG tablet Commonly known as:  LIPITOR Take 1 tablet (80 mg total) by mouth daily. Start taking on:  12/24/2017 What changed:    medication strength  See the new instructions.   clopidogrel 75 MG tablet Commonly known as:  PLAVIX Take 1 tablet (75 mg total) by mouth daily with breakfast. Same Day PCI Start taking on:  12/24/2017   diltiazem 180 MG 24 hr capsule Commonly known as:  CARDIZEM CD TAKE 1 CAPSULE (180 MG TOTAL) BY MOUTH DAILY. What changed:  See the new instructions.   diltiazem 30 MG tablet Commonly known as:  CARDIZEM TAKE ONE TO TWO TABLETS EVERY 6 HOURS AS NEEDED FOR PALPITATIONS. What changed:  See the new instructions.   dofetilide 500 MCG capsule Commonly known as:  TIKOSYN Take 1 capsule (500 mcg total) by mouth 2 (two) times daily.   nitroGLYCERIN 0.4 MG SL tablet Commonly known as:  NITROSTAT Place 1 tablet (0.4 mg total) under the tongue every 5 (five) minutes as needed.   tamsulosin 0.4 MG Caps capsule Commonly known as:  FLOMAX Take 1 capsule (0.4 mg total)  by mouth daily. What changed:    when to take this  reasons to take this   XYZAL ALLERGY 24HR PO Take 1 tablet by mouth daily as needed (allergies).        Acute coronary syndrome (MI, NSTEMI, STEMI, etc) this admission?: No.   Outstanding Labs/Studies   FLP/LFTs in 6 weeks if tolerating statin increase.   Duration of Discharge Encounter   Greater than 30 minutes including physician time.  Signed, Reino Bellis NP-C 12/23/2017, 3:03 PM

## 2017-12-23 NOTE — Progress Notes (Signed)
On arrival from cath lab, c/o indigestion and headache; no c/o chest pain or shortness of breath

## 2017-12-24 ENCOUNTER — Encounter (HOSPITAL_COMMUNITY): Payer: Self-pay | Admitting: Cardiovascular Disease

## 2018-01-07 ENCOUNTER — Encounter: Payer: Self-pay | Admitting: Internal Medicine

## 2018-01-07 ENCOUNTER — Ambulatory Visit: Payer: BC Managed Care – PPO | Admitting: Internal Medicine

## 2018-01-07 VITALS — BP 138/82 | HR 73 | Ht 70.0 in | Wt 192.2 lb

## 2018-01-07 DIAGNOSIS — I481 Persistent atrial fibrillation: Secondary | ICD-10-CM | POA: Diagnosis not present

## 2018-01-07 DIAGNOSIS — I10 Essential (primary) hypertension: Secondary | ICD-10-CM | POA: Diagnosis not present

## 2018-01-07 DIAGNOSIS — Z955 Presence of coronary angioplasty implant and graft: Secondary | ICD-10-CM | POA: Diagnosis not present

## 2018-01-07 DIAGNOSIS — I4819 Other persistent atrial fibrillation: Secondary | ICD-10-CM

## 2018-01-07 NOTE — Progress Notes (Signed)
PCP: Jerrol Banana., MD Primary EP: Dr Rayann Heman  Jorge [name] is a 52 y.o. male who presents today for routine electrophysiology followup.  He is s/p PCI of mid LAD after large D1 by Dr Angelena Form 12/23/17.  He is doing well.  Today, he denies symptoms of palpitations, chest pain, shortness of breath,  lower extremity edema, dizziness, presyncope, or syncope.  The patient is otherwise without complaint today.   Past Medical History:  Diagnosis Date  . Atrial fibrillation ([place])    first diagnosed 2011; s/p DCCV at that time; ablation 10-2013 & 03/2014; failed multaq, flecainide and amiodarone  . Chronic anticoagulation    Xarelto  . Chronic kidney disease    H/O KIDNEY STONES  . Dysrhythmia   . Headache   . Hyperlipidemia   . Hypertension   . Sinusitis    Past Surgical History:  Procedure Laterality Date  . APPENDECTOMY  1975  . ATRIAL FIBRILLATION ABLATION N/A 11/02/2013   PVI by Dr Rayann Heman  . ATRIAL FIBRILLATION ABLATION N/A 03/29/2014   PVI and CTI by Dr Rayann Heman  . ATRIAL FIBRILLATION ABLATION N/A 07/04/2016   Procedure: Atrial Fibrillation Ablation;  Surgeon: Thompson Grayer, MD;  Location: [place] [facility];  Service: Cardiovascular;  Laterality: N/A;  . CARDIOVERSION     Hx of  . CARDIOVERSION N/A 10/15/2013   Procedure: CARDIOVERSION;  Surgeon: Fay Records, MD;  Location: [facility];  Service: Cardiovascular;  Laterality: N/A;  . COLONOSCOPY  2016  . CORONARY STENT INTERVENTION N/A 12/23/2017   Procedure: CORONARY STENT INTERVENTION;  Surgeon: Burnell Blanks, MD;  Location: [place] [facility];  Service: Cardiovascular;  Laterality: N/A;  . HERNIA REPAIR Left 05/28/2013   left inguinal henria repair, robotic direct defect repaired with Bard ultralight mesh.  Marland Kitchen HERNIA REPAIR Left 05/31/2015   Large Atrium pro loop mesh, direct defect.   . INGUINAL HERNIA REPAIR Left 05/31/2015   Procedure: HERNIA REPAIR INGUINAL ADULT;  Surgeon: Robert Bellow, MD;  Location:  [facility];  Service: General;  Laterality: Left;  . KNEE SURGERY  2009   [facility]  . LITHOTRIPSY     x 3  last one  2013  . RHINOPLASTY  2006   Dr. Nadeen Landau, [facility]  . RIGHT/LEFT HEART CATH AND CORONARY ANGIOGRAPHY N/A 12/23/2017   Procedure: RIGHT/LEFT HEART CATH AND CORONARY ANGIOGRAPHY;  Surgeon: Burnell Blanks, MD;  Location: [place] [facility];  Service: Cardiovascular;  Laterality: N/A;  . TEE WITHOUT CARDIOVERSION N/A 11/01/2013   Procedure: TRANSESOPHAGEAL ECHOCARDIOGRAM (TEE);  Surgeon: Candee Furbish, MD;  Location: [facility];  Service: Cardiovascular;  Laterality: N/A;  . TEE WITHOUT CARDIOVERSION N/A 03/28/2014   Procedure: TRANSESOPHAGEAL ECHOCARDIOGRAM (TEE);  Surgeon: Larey Dresser, MD;  Location: [place];  Service: Cardiovascular;  Laterality: N/A;    ROS- all systems are reviewed and negatives except as per HPI above  Current Outpatient Medications  Medication Sig Dispense Refill  . aspirin EC 81 MG tablet Take 1 tablet (81 mg total) by mouth daily. 90 tablet 3  . atorvastatin (LIPITOR) 80 MG tablet Take 1 tablet (80 mg total) by mouth daily. 90 tablet 1  . clopidogrel (PLAVIX) 75 MG tablet Take 1 tablet (75 mg total) by mouth daily with breakfast. Same Day PCI 30 tablet 0  . diltiazem (CARDIZEM CD) 180 MG 24 hr capsule TAKE 1 CAPSULE (180 MG TOTAL) BY MOUTH DAILY. (Patient taking differently: Take 180 mg by  mouth at bedtime. ) 90 capsule 3  . diltiazem (CARDIZEM) 30 MG tablet TAKE ONE TO TWO TABLETS EVERY 6 HOURS AS NEEDED FOR PALPITATIONS. (Patient taking differently: Take 30 mg by mouth 2 (two) times daily. ) 30 tablet 6  . dofetilide (TIKOSYN) 500 MCG capsule Take 1 capsule (500 mcg total) by mouth 2 (two) times daily. 180 capsule 3  . Levocetirizine Dihydrochloride (XYZAL ALLERGY 24HR PO) Take 1 tablet by mouth daily as needed (allergies).     . nitroGLYCERIN (NITROSTAT) 0.4 MG SL tablet Place 1 tablet (0.4 mg total) under the  tongue every 5 (five) minutes as needed. 25 tablet 2  . tamsulosin (FLOMAX) 0.4 MG CAPS capsule Take 1 capsule (0.4 mg total) by mouth daily. (Patient taking differently: Take 0.4 mg by mouth daily as needed (PROSTATE). ) 30 capsule 11   No current facility-administered medications for this visit.     Physical Exam: Vitals:   01/07/18 0900  BP: 138/82  Pulse: 73  SpO2: 98%  Weight: 192 lb 3.2 oz (87.2 kg)  Height: 5\' 10"  (1.778 m)    GEN- The patient is well appearing, alert and oriented x 3 today.   Head- normocephalic, atraumatic Eyes-  Sclera clear, conjunctiva pink Ears- hearing intact Oropharynx- clear Lungs- Clear to ausculation bilaterally, normal work of breathing Heart- Regular rate and rhythm, no murmurs, rubs or gallops, PMI not laterally displaced GI- soft, NT, ND, + BS Extremities- no clubbing, cyanosis, or edema  Wt Readings from Last 3 Encounters:  01/07/18 192 lb 3.2 oz (87.2 kg)  12/23/17 190 lb (86.2 kg)  12/17/17 192 lb 9.6 oz (87.4 kg)    EKG tracing ordered today is personally reviewed and shows sinus rhythm 73 bpm, PR 200 msec, Qtc 482 msec  Assessment and Plan:  1. CAD Recently diagnosed S/p PCI of LAD Continue current therapy  2. Persistent afib In sinus today chads2vasc score is now 2.   Consider anticoagulation once off antiplatelet therapy  3. HTN Consider losartan in the future  I would like for Dr Angelena Form to help me with CAD management going forward Followup with me for AF in 4 months  Thompson Grayer MD, [facility] 01/07/2018 9:14 AM

## 2018-01-07 NOTE — Patient Instructions (Signed)
Medication Instructions:  Your physician recommends that you continue on your current medications as directed. Please refer to the Current Medication list given to you today.  * If you need a refill on your cardiac medications before your next appointment, please call your pharmacy.   Labwork: None ordered  Testing/Procedures: None ordered  Follow-Up: Your physician recommends that you schedule a follow-up appointment in: 4-6 weeks with Dr. Angelena Form.  Your physician recommends that you schedule a follow-up appointment in: 4 months with Dr. Rayann Heman.   Thank you for choosing [facility]!!

## 2018-01-12 ENCOUNTER — Encounter (HOSPITAL_COMMUNITY): Payer: Self-pay | Admitting: *Deleted

## 2018-01-14 ENCOUNTER — Other Ambulatory Visit: Payer: Self-pay

## 2018-01-16 ENCOUNTER — Other Ambulatory Visit: Payer: Self-pay | Admitting: *Deleted

## 2018-01-16 MED ORDER — CLOPIDOGREL BISULFATE 75 MG PO TABS: 75.0000 mg | ORAL_TABLET | Freq: Every day | ORAL | 1 refills | Status: DC

## 2018-01-16 NOTE — Telephone Encounter (Signed)
Will refill based on 12/23/17 hospital d/c note as below:  [place] cardiac cath noted above with severe stenosis in the mLAD with successful PCI/DES x1 to the mLAD. Moderate non-obstructive disease in the LCx and OM. Normal LV function. Plan for DAPT with ASA/plavix for at least 6 months. Also increased his Lipitor to 80mg  post cath. Seen by cardiac rehab while in short stay. Instructions/precautions given prior to discharge.   Jorge [name] was seen by Dr. Angelena Form and determined stable for discharge home. Follow up in the office has been arranged.

## 2018-01-16 NOTE — Telephone Encounter (Signed)
Originally ordered by Reino Bellis. Okay to refill under Dr Rayann Heman or should request be approved by Dr Angelena Form? Please advise. Thanks, MI

## 2018-02-11 ENCOUNTER — Encounter: Payer: Self-pay | Admitting: Internal Medicine

## 2018-02-18 ENCOUNTER — Ambulatory Visit (INDEPENDENT_AMBULATORY_CARE_PROVIDER_SITE_OTHER): Payer: BC Managed Care – PPO | Admitting: Cardiovascular Disease

## 2018-02-18 ENCOUNTER — Encounter: Payer: Self-pay | Admitting: Internal Medicine

## 2018-02-18 ENCOUNTER — Encounter: Payer: Self-pay | Admitting: Cardiovascular Disease

## 2018-02-18 VITALS — BP 128/76 | HR 64 | Ht 70.0 in | Wt 194.0 lb

## 2018-02-18 DIAGNOSIS — I10 Essential (primary) hypertension: Secondary | ICD-10-CM | POA: Diagnosis not present

## 2018-02-18 DIAGNOSIS — I2510 Atherosclerotic heart disease of native coronary artery without angina pectoris: Secondary | ICD-10-CM

## 2018-02-18 DIAGNOSIS — I4819 Other persistent atrial fibrillation: Secondary | ICD-10-CM | POA: Diagnosis not present

## 2018-02-18 NOTE — Progress Notes (Signed)
Chief Complaint  Patient presents with  . Follow-up    CAD   History of Present Illness: 52 yo male with history of atrial fibrillation, HTN, hyperlipidemia and CAD here today for cardiac follow up. His atrial fibrillation is followed by Dr. Rayann Heman. He began having chest pain and dyspnea in the summer of 2019. Nuclear stress test was low risk. He continue to have symptoms and Dr. Rayann Heman referred him for a cardiac cath on 12/23/17. Cardiac cath with severe stenosis in the mid LAD treated with a drug eluting stent. There was mild disease in the RCA and moderate non-obstructive disease in the Circumflex. He has been on ASA and Plavix. He has not been on anti-coagulation but Dr. Rayann Heman has considered starting this after his anti-platelet therapy is stopped.    He is here today for follow up. The patient denies any chest pain, dyspnea, palpitations, lower extremity edema, orthopnea, PND, dizziness, near syncope or syncope. He feels great. He is exercising every day.   Primary Care Physician: Jerrol Banana., MD  Past Medical History:  Diagnosis Date  . Atrial fibrillation ([place])    first diagnosed 2011; s/p DCCV at that time; ablation 10-2013 & 03/2014; failed multaq, flecainide and amiodarone  . Chronic anticoagulation    Xarelto  . Chronic kidney disease    H/O KIDNEY STONES  . Dysrhythmia   . Headache   . Hyperlipidemia   . Hypertension   . Sinusitis     Past Surgical History:  Procedure Laterality Date  . APPENDECTOMY  1975  . ATRIAL FIBRILLATION ABLATION N/A 11/02/2013   PVI by Dr Rayann Heman  . ATRIAL FIBRILLATION ABLATION N/A 03/29/2014   PVI and CTI by Dr Rayann Heman  . ATRIAL FIBRILLATION ABLATION N/A 07/04/2016   Procedure: Atrial Fibrillation Ablation;  Surgeon: Thompson Grayer, MD;  Location: [place] [facility];  Service: Cardiovascular;  Laterality: N/A;  . CARDIOVERSION     Hx of  . CARDIOVERSION N/A 10/15/2013   Procedure: CARDIOVERSION;  Surgeon: Fay Records, MD;  Location: [place];  Service: Cardiovascular;  Laterality: N/A;  . COLONOSCOPY  2016  . CORONARY STENT INTERVENTION N/A 12/23/2017   Procedure: CORONARY STENT INTERVENTION;  Surgeon: Burnell Blanks, MD;  Location: [place] [facility];  Service: Cardiovascular;  Laterality: N/A;  . HERNIA REPAIR Left 05/28/2013   left inguinal henria repair, robotic direct defect repaired with Bard ultralight mesh.  Marland Kitchen HERNIA REPAIR Left 05/31/2015   Large Atrium pro loop mesh, direct defect.   . INGUINAL HERNIA REPAIR Left 05/31/2015   Procedure: HERNIA REPAIR INGUINAL ADULT;  Surgeon: Robert Bellow, MD;  Location: [facility] ORS;  Service: General;  Laterality: Left;  . KNEE SURGERY  2009   [facility]  . LITHOTRIPSY     x 3  last one  2013  . RHINOPLASTY  2006   [facility]. [facility]ade[facility] Landau, [facility]  . RIGHT/LEFT HEART CATH AND CORONARY ANGIOGRAPHY N/A 12/23/2017   Procedure: RIGHT/LEFT HEART CATH AND CORONARY ANGIOGRAPHY;  Surgeon: Burnell Blanks, MD;  Location: [place] [facility];  Service: Cardiovascular;  Laterality: N/A;  . TEE WITHOUT CARDIOVERSION N/A 11/01/2013   Procedure: TRANSESOPHAGEAL ECHOCARDIOGRAM (TEE);  Surgeon: Candee Furbish, MD;  Location: [facility] ENDOSCOPY;  Service: Cardiovascular;  Laterality: N/A;  . TEE WITHOUT CARDIOVERSION N/A 03/28/2014   Procedure: TRANSESOPHAGEAL ECHOCARDIOGRAM (TEE);  Surgeon: Larey Dresser, MD;  Location: [facility] ENDOSCOPY;  Service: Cardiovascular;  Laterality: N/A;    Current Outpatient Medications  Medication Sig Dispense Refill  . aspirin EC 81 MG tablet Take 1 tablet (81 mg total) by mouth daily. 90 tablet 3  . atorvastatin (LIPITOR) 80 MG tablet Take 1 tablet (80 mg total) by mouth daily. 90 tablet 1  . clopidogrel (PLAVIX) 75 MG tablet Take 1 tablet (75 mg total) by mouth daily with breakfast. 90 tablet 1  . diltiazem (CARDIZEM CD) 180 MG 24 hr capsule Take 180 mg by mouth at bedtime.    . dofetilide (TIKOSYN) 500 MCG capsule Take 1 capsule (500 mcg total)  by mouth 2 (two) times daily. 180 capsule 3  . Levocetirizine Dihydrochloride (XYZAL ALLERGY 24HR PO) Take 1 tablet by mouth daily as needed (allergies).     . nitroGLYCERIN (NITROSTAT) 0.4 MG SL tablet Place 1 tablet (0.4 mg total) under the tongue every 5 (five) minutes as needed. 25 tablet 2  . tamsulosin (FLOMAX) 0.4 MG CAPS capsule Take 0.4 mg by mouth as needed (PROSTATE).     Marland Kitchen diltiazem (CARDIZEM) 30 MG tablet TAKE ONE TO TWO TABLETS EVERY 6 HOURS AS NEEDED FOR PALPITATIONS. (Patient not taking: No sig reported) 30 tablet 6   No current facility-administered medications for this visit.     Allergies  Allergen Reactions  . Oxycodone Nausea And Vomiting  . Vicodin [Hydrocodone-Acetaminophen] Nausea And Vomiting    Can take regular Tylenol    Social History   Socioeconomic History  . Marital status: Married    Spouse name: Not on file  . Number of children: Not on file  . Years of education: Not on file  . Highest education level: Not on file  Occupational History  . Occupation: Passenger transport manager w/ [employer]  Social Needs  . Financial resource strain: Not on file  . Food insecurity:    Worry: Not on file    Inability: Not on file  . Transportation needs:    Medical: Not on file    Non-medical: Not on file  Tobacco Use  . Smoking status: Never Smoker  . Smokeless tobacco: Never Used  Substance and Sexual Activity  . Alcohol use: Yes    Alcohol/week: 2.0 standard drinks    Types: 2 Cans of beer per week    Comment: occasionally  . Drug use: No  . Sexual activity: Yes  Lifestyle  . Physical activity:    Days per week: Not on file    Minutes per session: Not on file  . Stress: Not on file  Relationships  . Social connections:    Talks on phone: Not on file    Gets together: Not on file    Attends religious service: Not on file    Active member of club or organization: Not on file    Attends meetings of clubs or organizations: Not on file    Relationship  status: Not on file  . Intimate partner violence:    Fear of current or ex partner: Not on file    Emotionally abused: Not on file    Physically abused: Not on file    Forced sexual activity: Not on file  Other Topics Concern  . Not on file  Social History Narrative   Lives in [place] with his spouse.  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[employer]    Family History  Problem Relation Age of Onset  . Cancer Mother        breast, cause of death  . Heart attack Maternal Grandfather 36  Cause of death  . Hypertension Maternal Grandfather   . Heart failure Brother   . CVA Paternal Grandmother        cause of death  . Atrial fibrillation Maternal Grandmother   . Colon polyps Unknown        Fam Hx  . Kidney cancer Neg Hx   . Bladder Cancer Neg Hx   . Prostate cancer Neg Hx     Review of Systems:  As stated in the HPI and otherwise negative.   BP 128/76   Pulse 64   Ht 5\' 10"  (1.778 m)   Wt 194 lb (88 kg)   SpO2 96%   BMI 27.84 kg/m   Physical Examination: General: Well developed, well nourished, NAD  HEENT: OP clear, mucus membranes moist  SKIN: warm, dry. No rashes. Neuro: No focal deficits  Musculoskeletal: Muscle strength 5/5 all ext  Psychiatric: Mood and affect normal  Neck: No JVD, no carotid bruits, no thyromegaly, no lymphadenopathy.  Lungs:Clear bilaterally, no wheezes, rhonci, crackles Cardiovascular: Regular rate and rhythm. No murmurs, gallops or rubs. Abdomen:Soft. Bowel sounds present. Non-tender.  Extremities: No lower extremity edema. Pulses are 2 + in the bilateral DP/PT.  EKG:  EKG is not ordered today. The ekg ordered today demonstrates   Recent Labs: 03/27/2017: Magnesium 1.9; TSH 1.09 09/23/2017: ALT 24 12/22/2017: BUN 16; Creatinine, Ser 0.92; Hemoglobin 13.5; Platelets 238; Potassium 3.9; Sodium 140   Lipid Panel    Component Value Date/Time   CHOL 167 09/23/2017 0900   TRIG 61 09/23/2017 0900   HDL 48 09/23/2017 0900    CHOLHDL 3.6 03/27/2017 0841   LDLCALC 107 (H) 09/23/2017 0900   LDLCALC 123 (H) 03/27/2017 0841     Wt Readings from Last 3 Encounters:  02/18/18 194 lb (88 kg)  01/07/18 192 lb 3.2 oz (87.2 kg)  12/23/17 190 lb (86.2 kg)     Other studies Reviewed: Additional studies/ records that were reviewed today include: . Review of the above records demonstrates:    Assessment and Plan:   1. CAD without angina: No chest pain. Continue ASA, Plavix and statin. If we were to restart a DOAC, his ASA could be stopped.   2. Atrial fibrillation, paroxysmal: Atrial fibrillation today. Continue Cardizem and Tikosyn. Followed in the EP clinic.   3. HTN: BP well controlled. No changes today.   Current medicines are reviewed at length with the patient today.  The patient does not have concerns regarding medicines.  The following changes have been made:  no change  Labs/ tests ordered today include:  No orders of the defined types were placed in this encounter.    Disposition:   FU with me in 12 months   Signed, Lauree Chandler, MD 02/18/2018 11:12 AM    [address], [place], [place]  [address] Phone: [phone]; Fax: [phone]

## 2018-02-18 NOTE — Patient Instructions (Signed)
Medication Instructions:  Your physician recommends that you continue on your current medications as directed. Please refer to the Current Medication list given to you today.  If you need a refill on your cardiac medications before your next appointment, please call your pharmacy.   Lab work: none If you have labs (blood work) drawn today and your tests are completely normal, you will receive your results only by: Marland Kitchen MyChart Message (if you have MyChart) OR . A paper copy in the mail If you have any lab test that is abnormal or we need to change your treatment, we will call you to review the results.  Testing/Procedures: none  Follow-Up:  Your physician recommends that you schedule a follow-up appointment in: 3 months with Dr. Rayann Heman   At [facility], you and your health needs are our priority.  As part of our continuing mission to provide you with exceptional heart care, we have created designated Provider Care Teams.  These Care Teams include your primary Cardiologist (physician) and Advanced Practice Providers (APPs -  Physician Assistants and Nurse Practitioners) who all work together to provide you with the care you need, when you need it. You will need a follow up appointment in 12 months.  Please call our office 2 months in advance to schedule this appointment.  You may see Dr. Lillia Mountain or one of the following Advanced Practice Providers on your designated Care Team:   [place], PA-C Melina Copa, PA-C . Ermalinda Barrios, PA-C  Any Other Special Instructions Will Be Listed Below (If Applicable).

## 2018-03-13 ENCOUNTER — Ambulatory Visit (INDEPENDENT_AMBULATORY_CARE_PROVIDER_SITE_OTHER): Payer: BC Managed Care – PPO | Admitting: Family Medicine

## 2018-03-13 ENCOUNTER — Encounter: Payer: Self-pay | Admitting: Family Medicine

## 2018-03-13 VITALS — BP 122/84 | HR 79 | Temp 98.9°F | Resp 16 | Wt 186.8 lb

## 2018-03-13 DIAGNOSIS — J069 Acute upper respiratory infection, unspecified: Secondary | ICD-10-CM | POA: Diagnosis not present

## 2018-03-13 NOTE — Patient Instructions (Signed)
Discussed use of saline irrigation, nasal decongestant for 3 days, Mucinex, and Delsym for cough.

## 2018-03-13 NOTE — Progress Notes (Signed)
  Subjective:     Patient ID: Jorge [name], male   DOB: 06/13/1965, 52 y.o.   MRN: [id number] Chief Complaint  Patient presents with  . Sinus Problem    Patient comes in office today with concerns of sinus pressure around head for the past 3-4days. Patient reports having the following symptoms: headache, pain behind eyes, low grade fever and cough for less than 24hrs. Patient has been taking otc Tylenol Severe Sinus and Tylenol extra strength.    HPI Denies sinus drainage, mainly concerned about sinus headache and pressure. Have suggested he avoid oral decongestants due to his hx of A.FIb.  Review of Systems     Objective:   Physical Exam  Constitutional: He appears well-developed and well-nourished. No distress.  Ears: T.M's intact without inflammation Sinuses: non-tender Throat: no tonsillar enlargement or exudate Neck: no cervical adenopathy Lungs: clear     Assessment:    1. URI, acute    Plan:    Discussed use of saline irrigation (sample provided), Mucinex, Delsym, and short term use of nasal decongestant spray. Will call if sinuses not improving next week.

## 2018-03-19 ENCOUNTER — Ambulatory Visit: Payer: BC Managed Care – PPO

## 2018-03-23 ENCOUNTER — Ambulatory Visit (INDEPENDENT_AMBULATORY_CARE_PROVIDER_SITE_OTHER): Payer: BC Managed Care – PPO | Admitting: Family Medicine

## 2018-03-23 ENCOUNTER — Encounter: Payer: Self-pay | Admitting: Family Medicine

## 2018-03-23 VITALS — BP 118/72 | HR 76 | Temp 98.0°F | Resp 16 | Wt 191.0 lb

## 2018-03-23 DIAGNOSIS — J321 Chronic frontal sinusitis: Secondary | ICD-10-CM

## 2018-03-23 MED ORDER — AMOXICILLIN-POT CLAVULANATE 875-125 MG PO TABS: 1.0000 | ORAL_TABLET | Freq: Two times a day (BID) | ORAL | 0 refills | Status: DC

## 2018-03-23 NOTE — Patient Instructions (Addendum)
Frontal sinusitis, unspecified chronicity  -amoxicillin-clavulanate (AUGMENTIN) 875-125 MG tablet x2 qd for 10 days

## 2018-03-23 NOTE — Progress Notes (Signed)
Patient: Jorge [name] Male    DOB: 15-Aug-1965   52 y.o.   MRN: [id number] Visit Date: 03/23/2018  Today's Provider: Wilhemena Durie, MD   Chief Complaint  Patient presents with  . Sinusitis   Subjective:    Sinusitis  This is a recurrent problem. The current episode started 1 to 4 weeks ago. There has been no fever. Associated symptoms include congestion, headaches, a hoarse voice, sinus pressure and swollen glands. Pertinent negatives include no chills, coughing, diaphoresis, ear pain, shortness of breath, sneezing or sore throat. Treatments tried: Mucinex and Delsym. The treatment provided mild relief.    Patient states he saw Mariel Sleet on 03/13/2018 for URI. Patient was advised to treat symptoms with Mucinex and Delsym. Patient states symptoms are not getting any better. Sinus pain and pressure.   Allergies  Allergen Reactions  . Oxycodone Nausea And Vomiting  . Vicodin [Hydrocodone-Acetaminophen] Nausea And Vomiting    Can take regular Tylenol     Current Outpatient Medications:  .  aspirin EC 81 MG tablet, Take 1 tablet (81 mg total) by mouth daily., Disp: 90 tablet, Rfl: 3 .  atorvastatin (LIPITOR) 80 MG tablet, Take 1 tablet (80 mg total) by mouth daily., Disp: 90 tablet, Rfl: 1 .  clopidogrel (PLAVIX) 75 MG tablet, Take 1 tablet (75 mg total) by mouth daily with breakfast., Disp: 90 tablet, Rfl: 1 .  diltiazem (CARDIZEM CD) 180 MG 24 hr capsule, Take 180 mg by mouth at bedtime., Disp: , Rfl:  .  diltiazem (CARDIZEM) 30 MG tablet, TAKE ONE TO TWO TABLETS EVERY 6 HOURS AS NEEDED FOR PALPITATIONS., Disp: 30 tablet, Rfl: 6 .  dofetilide (TIKOSYN) 500 MCG capsule, Take 1 capsule (500 mcg total) by mouth 2 (two) times daily., Disp: 180 capsule, Rfl: 3 .  fluticasone (FLONASE) 50 MCG/ACT nasal spray, Place into both nostrils daily., Disp: , Rfl:  .  Levocetirizine Dihydrochloride (XYZAL ALLERGY 24HR PO), Take 1 tablet by mouth daily as needed (allergies). ,  Disp: , Rfl:  .  nitroGLYCERIN (NITROSTAT) 0.4 MG SL tablet, Place 1 tablet (0.4 mg total) under the tongue every 5 (five) minutes as needed., Disp: 25 tablet, Rfl: 2 .  tamsulosin (FLOMAX) 0.4 MG CAPS capsule, Take 0.4 mg by mouth as needed (PROSTATE). , Disp: , Rfl:   Review of Systems  Constitutional: Negative for appetite change, chills, diaphoresis and fever.  HENT: Positive for congestion, hoarse voice and sinus pressure. Negative for ear pain, sneezing and sore throat.   Eyes: Negative.   Respiratory: Negative for cough, chest tightness, shortness of breath and wheezing.   Cardiovascular: Negative for chest pain and palpitations.  Gastrointestinal: Negative for abdominal pain, nausea and vomiting.  Endocrine: Negative.   Allergic/Immunologic: Negative.   Neurological: Positive for headaches.  Psychiatric/Behavioral: Negative.     Social History   Tobacco Use  . Smoking status: Never Smoker  . Smokeless tobacco: Never Used  Substance Use Topics  . Alcohol use: Yes    Alcohol/week: 2.0 standard drinks    Types: 2 Cans of beer per week    Comment: occasionally   Objective:   BP 118/72 (BP Location: Right Arm, Patient Position: Sitting, Cuff Size: Large)   Pulse 76   Temp 98 F (36.7 C) (Oral)   Resp 16   Wt 191 lb (86.6 kg)   SpO2 97%   BMI 27.41 kg/m  Vitals:   03/23/18 0925  BP: 118/72  Pulse: 76  Resp: 16  Temp: 98 F (36.7 C)  TempSrc: Oral  SpO2: 97%  Weight: 191 lb (86.6 kg)     Physical Exam  Constitutional: He is oriented to person, place, and time. He appears well-developed and well-nourished.  HENT:  Head: Normocephalic and atraumatic.  Right Ear: External ear normal.  Left Ear: External ear normal.  Nose: Nose normal.  Mouth/Throat: Oropharynx is clear and moist.  Mildly tender over frontal sinuses  Eyes: Conjunctivae are normal. No scleral icterus.  Neck: No thyromegaly present.  Cardiovascular: Normal rate, regular rhythm, normal heart  sounds and intact distal pulses.  Pulmonary/Chest: Effort normal and breath sounds normal.  Musculoskeletal: He exhibits no edema.  Neurological: He is alert and oriented to person, place, and time.  Skin: Skin is warm and dry.  Psychiatric: He has a normal mood and affect. His behavior is normal. Judgment and thought content normal.        Assessment & Plan:     1. Frontal sinusitis, unspecified chronicity ENT referral if he does not improve. - amoxicillin-clavulanate (AUGMENTIN) 875-125 MG tablet; Take 1 tablet by mouth 2 (two) times daily.  Dispense: 20 tablet; Refill: 0      I have done the exam and reviewed the above chart and it is accurate to the best of my knowledge. Development worker, community has been used in this note in any air is in the dictation or transcription are unintentional.  Wilhemena Durie, MD  [facility]

## 2018-03-31 ENCOUNTER — Encounter: Payer: Self-pay | Admitting: Family Medicine

## 2018-03-31 ENCOUNTER — Ambulatory Visit (INDEPENDENT_AMBULATORY_CARE_PROVIDER_SITE_OTHER): Payer: BC Managed Care – PPO | Admitting: Family Medicine

## 2018-03-31 VITALS — BP 122/68 | HR 72 | Temp 98.2°F | Resp 16 | Ht 70.0 in | Wt 189.0 lb

## 2018-03-31 DIAGNOSIS — I10 Essential (primary) hypertension: Secondary | ICD-10-CM | POA: Diagnosis not present

## 2018-03-31 DIAGNOSIS — E785 Hyperlipidemia, unspecified: Secondary | ICD-10-CM

## 2018-03-31 DIAGNOSIS — Z0000 Encounter for general adult medical examination without abnormal findings: Secondary | ICD-10-CM

## 2018-03-31 DIAGNOSIS — Z125 Encounter for screening for malignant neoplasm of prostate: Secondary | ICD-10-CM | POA: Diagnosis not present

## 2018-03-31 DIAGNOSIS — G4733 Obstructive sleep apnea (adult) (pediatric): Secondary | ICD-10-CM

## 2018-03-31 LAB — POCT URINALYSIS DIPSTICK
Bilirubin, UA: NEGATIVE
Glucose, UA: NEGATIVE
KETONES UA: NEGATIVE
LEUKOCYTES UA: NEGATIVE
Nitrite, UA: NEGATIVE
PH UA: 7 (ref 5.0–8.0)
PROTEIN UA: NEGATIVE
SPEC GRAV UA: 1.01 (ref 1.010–1.025)
UROBILINOGEN UA: 0.2 U/dL

## 2018-03-31 NOTE — Progress Notes (Signed)
Patient: Jorge [name], Male    DOB: January 24, 1966, 52 y.o.   MRN: [id number] Visit Date: 03/31/2018  Today's Provider: Wilhemena Durie, MD   Chief Complaint  Patient presents with  . Annual Exam   Subjective:    Annual physical exam Jorge [name] is a 52 y.o. male who presents today for health maintenance and complete physical. He feels well. He reports exercising 3-4 days daily. He reports he is sleeping well. Enjoying retirement.  He does have chronic atrial fibrillation and had a stent placed in his LAD this summer.  Colonoscopy- 01/19/2015. Entire colon was normal.  Tdap- 02/09/2013.     Review of Systems  Constitutional: Negative.   HENT: Positive for rhinorrhea and sinus pressure.   Eyes: Negative.   Respiratory: Negative.   Cardiovascular: Negative.   Gastrointestinal: Negative.   Endocrine: Negative.   Genitourinary: Negative.   Musculoskeletal: Negative.   Skin: Negative.   Allergic/Immunologic: Negative.   Hematological: Negative.   Psychiatric/Behavioral: Negative.     Social History      He  reports that he has never smoked. He has never used smokeless tobacco. He reports that he drinks about 2.0 standard drinks of alcohol per week. He reports that he does not use drugs.       Social History   Socioeconomic History  . Marital status: Married    Spouse name: Not on file  . Number of children: Not on file  . Years of education: Not on file  . Highest education level: Not on file  Occupational History  . Occupation: Passenger transport manager w/ [employer]  Social Needs  . Financial resource strain: Not on file  . Food insecurity:    Worry: Not on file    Inability: Not on file  . Transportation needs:    Medical: Not on file    Non-medical: Not on file  Tobacco Use  . Smoking status: Never Smoker  . Smokeless tobacco: Never Used  Substance and Sexual Activity  . Alcohol use: Yes    Alcohol/week: 2.0 standard drinks    Types: 2 Cans  of beer per week    Comment: occasionally  . Drug use: No  . Sexual activity: Yes  Lifestyle  . Physical activity:    Days per week: Not on file    Minutes per session: Not on file  . Stress: Not on file  Relationships  . Social connections:    Talks on phone: Not on file    Gets together: Not on file    Attends religious service: Not on file    Active member of club or organization: Not on file    Attends meetings of clubs or organizations: Not on file    Relationship status: Not on file  Other Topics Concern  . Not on file  Social History Narrative   Lives in [place] with his spouse.  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[employer]    Past Medical History:  Diagnosis Date  . Atrial fibrillation ([place])    first diagnosed 2011; s/p DCCV at that time; ablation 10-2013 & 03/2014; failed multaq, flecainide and amiodarone  . Chronic anticoagulation    Xarelto  . Chronic kidney disease    H/O KIDNEY STONES  . Dysrhythmia   . Headache   . Hyperlipidemia   . Hypertension   . Sinusitis      Patient Active Problem List   Diagnosis Date Noted  .  Unstable angina ([place])   . A-fib ([place]) 07/04/2016  . Allergic rhinitis 03/08/2015  . HLD (hyperlipidemia) 03/08/2015  . Atrial fibrillation with rapid ventricular response ([place]) 07/04/2014  . Chronic anticoagulation - Xarelto   . Paroxysmal atrial fibrillation ([place]) 03/29/2014  . Essential hypertension 12/15/2013  . Atrial fibrillation ([place]) 06/13/2013    Past Surgical History:  Procedure Laterality Date  . APPENDECTOMY  1975  . ATRIAL FIBRILLATION ABLATION N/A 11/02/2013   PVI by Dr Rayann Heman  . ATRIAL FIBRILLATION ABLATION N/A 03/29/2014   PVI and CTI by Dr Rayann Heman  . ATRIAL FIBRILLATION ABLATION N/A 07/04/2016   Procedure: Atrial Fibrillation Ablation;  Surgeon: Thompson Grayer, MD;  Location: [facility];  Service: Cardiovascular;  Laterality: N/A;  . CARDIOVERSION     Hx of  . CARDIOVERSION N/A 10/15/2013   Procedure:  CARDIOVERSION;  Surgeon: Fay Records, MD;  Location: [facility];  Service: Cardiovascular;  Laterality: N/A;  . COLONOSCOPY  2016  . CORONARY STENT INTERVENTION N/A 12/23/2017   Procedure: CORONARY STENT INTERVENTION;  Surgeon: Burnell Blanks, MD;  Location: [facility];  Service: Cardiovascular;  Laterality: N/A;  . HERNIA REPAIR Left 05/28/2013   left inguinal henria repair, robotic direct defect repaired with Bard ultralight mesh.  Marland Kitchen HERNIA REPAIR Left 05/31/2015   Large [employer] pro loop mesh, direct defect.   . INGUINAL HERNIA REPAIR Left 05/31/2015   Procedure: HERNIA REPAIR INGUINAL ADULT;  Surgeon: Robert Bellow, MD;  Location: [facility];  Service: General;  Laterality: Left;  . KNEE SURGERY  2009   [facility]  . LITHOTRIPSY     x 3  last one  2013  . RHINOPLASTY  2006   Dr. Nadeen Landau, [facility]  . RIGHT/LEFT HEART CATH AND CORONARY ANGIOGRAPHY N/A 12/23/2017   Procedure: RIGHT/LEFT HEART CATH AND CORONARY ANGIOGRAPHY;  Surgeon: Burnell Blanks, MD;  Location: [place];  Service: Cardiovascular;  Laterality: N/A;  . TEE WITHOUT CARDIOVERSION N/A 11/01/2013   Procedure: TRANSESOPHAGEAL ECHOCARDIOGRAM (TEE);  Surgeon: Candee Furbish, MD;  Location[facility];  Service: Cardiovascular;  Laterality: N/A;  . TEE WITHOUT CARDIOVERSION N/A 03/28/2014   Procedure: TRANSESOPHAGEAL ECHOCARDIOGRAM (TEE);  Surgeon: Larey Dresser, MD;  Location: [facility] ENDOSCOPY;  Service: Cardiovascular;  Laterality: N/A;    Family History        Family Status  Relation Name Status  . Mother  Deceased at age 10  . MGF  Deceased at age 33       from MI  . Brother  Alive       stable health, mental health issues  . PGM  Deceased       From CVA  . Father  Deceased at age 63       suicide  . MGM  Deceased  . Unknown  (Not Specified)  . PGF  Deceased  . Neg Hx  (Not Specified)        His family history includes Atrial fibrillation in his maternal  grandmother; CVA in his paternal grandmother; Cancer in his mother; Colon polyps in his unknown relative; Heart attack (age of onset: 41) in his maternal grandfather; Heart failure in his brother; Hypertension in his maternal grandfather. There is no history of Kidney cancer, Bladder Cancer, or Prostate cancer.      Allergies  Allergen Reactions  . Oxycodone Nausea And Vomiting  . Vicodin [Hydrocodone-Acetaminophen] Nausea And Vomiting    Can take regular Tylenol  Current Outpatient Medications:  .  amoxicillin-clavulanate (AUGMENTIN) 875-125 MG tablet, Take 1 tablet by mouth 2 (two) times daily., Disp: 20 tablet, Rfl: 0 .  aspirin EC 81 MG tablet, Take 1 tablet (81 mg total) by mouth daily., Disp: 90 tablet, Rfl: 3 .  atorvastatin (LIPITOR) 80 MG tablet, Take 1 tablet (80 mg total) by mouth daily., Disp: 90 tablet, Rfl: 1 .  clopidogrel (PLAVIX) 75 MG tablet, Take 1 tablet (75 mg total) by mouth daily with breakfast., Disp: 90 tablet, Rfl: 1 .  diltiazem (CARDIZEM CD) 180 MG 24 hr capsule, Take 180 mg by mouth at bedtime., Disp: , Rfl:  .  diltiazem (CARDIZEM) 30 MG tablet, TAKE ONE TO TWO TABLETS EVERY 6 HOURS AS NEEDED FOR PALPITATIONS., Disp: 30 tablet, Rfl: 6 .  dofetilide (TIKOSYN) 500 MCG capsule, Take 1 capsule (500 mcg total) by mouth 2 (two) times daily., Disp: 180 capsule, Rfl: 3 .  fluticasone (FLONASE) 50 MCG/ACT nasal spray, Place into both nostrils daily., Disp: , Rfl:  .  Levocetirizine Dihydrochloride (XYZAL ALLERGY 24HR PO), Take 1 tablet by mouth daily as needed (allergies). , Disp: , Rfl:  .  nitroGLYCERIN (NITROSTAT) 0.4 MG SL tablet, Place 1 tablet (0.4 mg total) under the tongue every 5 (five) minutes as needed., Disp: 25 tablet, Rfl: 2 .  tamsulosin (FLOMAX) 0.4 MG CAPS capsule, Take 0.4 mg by mouth as needed (PROSTATE). , Disp: , Rfl:    Patient Care Team: Jerrol Banana., MD as PCP - General (Family Medicine) Thompson Grayer, MD as PCP - Electrophysiology  (Cardiology) Burnell Blanks, MD as PCP - Cardiology (Cardiology) Jerrol Banana., MD (Family Medicine) Bary Castilla, Forest Gleason, MD (General Surgery)      Objective:   Vitals: BP 122/68 (BP Location: Left Arm, Patient Position: Sitting, Cuff Size: Normal)   Pulse 72   Temp 98.2 F (36.8 C)   Resp 16   Ht 5\' 10"  (1.778 m)   Wt 189 lb (85.7 kg)   SpO2 98%   BMI 27.12 kg/m    Vitals:   03/31/18 0911  BP: 122/68  Pulse: 72  Resp: 16  Temp: 98.2 F (36.8 C)  SpO2: 98%  Weight: 189 lb (85.7 kg)  Height: 5\' 10"  (1.778 m)     Physical Exam  Constitutional: He is oriented to person, place, and time. He appears well-developed and well-nourished.  HENT:  Head: Normocephalic and atraumatic.  Right Ear: External ear normal.  Left Ear: External ear normal.  Nose: Nose normal.  Mouth/Throat: Oropharynx is clear and moist.  Eyes: Conjunctivae are normal. No scleral icterus.  Cardiovascular: Normal rate, regular rhythm and normal heart sounds.  Pulmonary/Chest: Effort normal and breath sounds normal.  Abdominal: Soft.  Musculoskeletal: He exhibits no edema.  Neurological: He is alert and oriented to person, place, and time.  Skin: Skin is warm and dry.  Psychiatric: He has a normal mood and affect. His behavior is normal. Judgment and thought content normal.     Depression Screen PHQ 2/9 Scores 03/31/2018 03/26/2017 03/08/2015  PHQ - 2 Score 0 0 0  PHQ- 9 Score - 1 -      Assessment & Plan:     Routine Health Maintenance and Physical Exam  Exercise Activities and Dietary recommendations Goals   None     Immunization History  Administered Date(s) Administered  . Influenza,inj,Quad PF,6+ Mos 03/13/2016, 03/26/2017  . Tdap 02/09/2013    Health Maintenance  Topic Date Due  .  HIV Screening  08/13/1980  . INFLUENZA VACCINE  11/27/2017  . TETANUS/TDAP  02/10/2023  . COLONOSCOPY  01/18/2025     Discussed health benefits of physical activity, and  encouraged him to engage in regular exercise appropriate for his age and condition.  1. Annual physical exam  - POCT urinalysis dipstick  2. Essential hypertension  - CBC with Differential/Platelet - Comprehensive metabolic panel  3. Hyperlipidemia, unspecified hyperlipidemia type  - Lipid panel - TSH  4. Prostate cancer screening  - PSA  5. OSA (obstructive sleep apnea) Patient to get back on his CPAP.   I have done the exam and reviewed the above chart and it is accurate to the best of my knowledge. Development worker, community has been used in this note in any air is in the dictation or transcription are unintentional.  Wilhemena Durie, MD  [place]

## 2018-04-01 ENCOUNTER — Telehealth: Payer: Self-pay

## 2018-04-01 LAB — COMPREHENSIVE METABOLIC PANEL
ALBUMIN: 4.6 g/dL (ref 3.5–5.5)
ALT: 35 IU/L (ref 0–44)
AST: 24 IU/L (ref 0–40)
Albumin/Globulin Ratio: 2.1 (ref 1.2–2.2)
Alkaline Phosphatase: 73 IU/L (ref 39–117)
BUN / CREAT RATIO: 15 (ref 9–20)
BUN: 13 mg/dL (ref 6–24)
Bilirubin Total: 0.7 mg/dL (ref 0.0–1.2)
CO2: 23 mmol/L (ref 20–29)
CREATININE: 0.85 mg/dL (ref 0.76–1.27)
Calcium: 9.4 mg/dL (ref 8.7–10.2)
Chloride: 99 mmol/L (ref 96–106)
GFR calc non Af Amer: 100 mL/min/{1.73_m2} (ref >59)
GFR, EST AFRICAN AMERICAN: 116 mL/min/{1.73_m2} (ref >59)
Globulin, Total: 2.2 g/dL (ref 1.5–4.5)
Glucose: 95 mg/dL (ref 65–99)
Potassium: 4.3 mmol/L (ref 3.5–5.2)
Sodium: 141 mmol/L (ref 134–144)
TOTAL PROTEIN: 6.8 g/dL (ref 6.0–8.5)

## 2018-04-01 LAB — CBC WITH DIFFERENTIAL/PLATELET
BASOS ABS: 0 10*3/uL (ref 0.0–0.2)
Basos: 1 %
EOS (ABSOLUTE): 0.2 10*3/uL (ref 0.0–0.4)
Eos: 3 %
HEMOGLOBIN: 12.9 g/dL — AB (ref 13.0–17.7)
Hematocrit: 37.5 % (ref 37.5–51.0)
IMMATURE GRANS (ABS): 0 10*3/uL (ref 0.0–0.1)
IMMATURE GRANULOCYTES: 0 %
LYMPHS: 25 %
Lymphocytes Absolute: 1.4 10*3/uL (ref 0.7–3.1)
MCH: 30.4 pg (ref 26.6–33.0)
MCHC: 34.4 g/dL (ref 31.5–35.7)
MCV: 88 fL (ref 79–97)
Monocytes Absolute: 0.5 10*3/uL (ref 0.1–0.9)
Monocytes: 8 %
Neutrophils Absolute: 3.7 10*3/uL (ref 1.4–7.0)
Neutrophils: 63 %
Platelets: 263 10*3/uL (ref 150–450)
RBC: 4.24 x10E6/uL (ref 4.14–5.80)
RDW: 12.5 % (ref 12.3–15.4)
WBC: 5.8 10*3/uL (ref 3.4–10.8)

## 2018-04-01 LAB — LIPID PANEL
Chol/HDL Ratio: 3.1 ratio (ref 0.0–5.0)
Cholesterol, Total: 150 mg/dL (ref 100–199)
HDL: 48 mg/dL (ref >39)
LDL Calculated: 91 mg/dL (ref 0–99)
Triglycerides: 55 mg/dL (ref 0–149)
VLDL CHOLESTEROL CAL: 11 mg/dL (ref 5–40)

## 2018-04-01 LAB — PSA: PROSTATE SPECIFIC AG, SERUM: 1.6 ng/mL (ref 0.0–4.0)

## 2018-04-01 LAB — TSH: TSH: 0.946 u[IU]/mL (ref 0.450–4.500)

## 2018-04-01 NOTE — Telephone Encounter (Signed)
-----   Message from Jerrol Banana., MD sent at 04/01/2018 10:53 AM EST ----- Labs ok--Cholesterol better

## 2018-04-01 NOTE — Telephone Encounter (Signed)
Advised patient of results. Patient wanted to know if you wanted to change his cholesterol medication to get his LDL below 70? Or would you rather wait until he has cholesterol rechecked next year before you make adjustments? Please advise. Thanks!

## 2018-04-06 DIAGNOSIS — G4733 Obstructive sleep apnea (adult) (pediatric): Secondary | ICD-10-CM | POA: Insufficient documentation

## 2018-04-07 ENCOUNTER — Ambulatory Visit (INDEPENDENT_AMBULATORY_CARE_PROVIDER_SITE_OTHER): Payer: BC Managed Care – PPO | Admitting: Family Medicine

## 2018-04-07 DIAGNOSIS — Z23 Encounter for immunization: Secondary | ICD-10-CM | POA: Diagnosis not present

## 2018-04-22 ENCOUNTER — Other Ambulatory Visit: Payer: Self-pay

## 2018-04-22 ENCOUNTER — Encounter: Payer: Self-pay | Admitting: Emergency Medicine

## 2018-04-22 ENCOUNTER — Emergency Department
Admission: EM | Admit: 2018-04-22 | Discharge: 2018-04-22 | Disposition: A | Discharge status: Home / Self Care | Payer: BC Managed Care – PPO | Attending: Emergency Medicine | Admitting: Emergency Medicine

## 2018-04-22 DIAGNOSIS — W260XXA Contact with knife, initial encounter: Secondary | ICD-10-CM | POA: Insufficient documentation

## 2018-04-22 DIAGNOSIS — Z79899 Other long term (current) drug therapy: Secondary | ICD-10-CM | POA: Insufficient documentation

## 2018-04-22 DIAGNOSIS — S61211A Laceration without foreign body of left index finger without damage to nail, initial encounter: Secondary | ICD-10-CM | POA: Diagnosis present

## 2018-04-22 DIAGNOSIS — N189 Chronic kidney disease, unspecified: Secondary | ICD-10-CM | POA: Diagnosis not present

## 2018-04-22 DIAGNOSIS — Z7982 Long term (current) use of aspirin: Secondary | ICD-10-CM | POA: Insufficient documentation

## 2018-04-22 DIAGNOSIS — Z7901 Long term (current) use of anticoagulants: Secondary | ICD-10-CM | POA: Insufficient documentation

## 2018-04-22 DIAGNOSIS — Z23 Encounter for immunization: Secondary | ICD-10-CM | POA: Insufficient documentation

## 2018-04-22 DIAGNOSIS — Y93G1 Activity, food preparation and clean up: Secondary | ICD-10-CM | POA: Diagnosis not present

## 2018-04-22 DIAGNOSIS — Y929 Unspecified place or not applicable: Secondary | ICD-10-CM | POA: Insufficient documentation

## 2018-04-22 DIAGNOSIS — Y999 Unspecified external cause status: Secondary | ICD-10-CM | POA: Insufficient documentation

## 2018-04-22 DIAGNOSIS — I129 Hypertensive chronic kidney disease with stage 1 through stage 4 chronic kidney disease, or unspecified chronic kidney disease: Secondary | ICD-10-CM | POA: Insufficient documentation

## 2018-04-22 MED ORDER — LIDOCAINE HCL (PF) 1 % IJ SOLN
INTRAMUSCULAR | Status: AC
  Administered 2018-04-22: 17:00:00
  Filled 2018-04-22: qty 5

## 2018-04-22 MED ORDER — TETANUS-DIPHTH-ACELL PERTUSSIS 5-2.5-18.5 LF-MCG/0.5 IM SUSP
0.5000 mL | Freq: Once | INTRAMUSCULAR | Status: AC
  Administered 2018-04-22: 0.5 mL via INTRAMUSCULAR
  Filled 2018-04-22: qty 0.5

## 2018-04-22 MED ORDER — CEPHALEXIN 500 MG PO CAPS
500.0000 mg | ORAL_CAPSULE | Freq: Once | ORAL | Status: AC
  Administered 2018-04-22: 500 mg via ORAL
  Filled 2018-04-22: qty 1

## 2018-04-22 MED ORDER — CEPHALEXIN 500 MG PO CAPS: 500.0000 mg | ORAL_CAPSULE | Freq: Four times a day (QID) | ORAL | 0 refills | Status: AC

## 2018-04-22 NOTE — ED Notes (Signed)
Patient denies pain and is resting comfortably.  

## 2018-04-22 NOTE — ED Triage Notes (Signed)
Laceration top of index finger L hand, cut on knife.

## 2018-04-22 NOTE — ED Provider Notes (Signed)
[facility] Emergency Department Provider Note  ____________________________________________  Time seen: Approximately 3:45 PM  I have reviewed the triage vital signs and the nursing notes.   HISTORY  Chief Complaint Laceration    HPI Jorge [name] is a 52 y.o. male that presents to the emergency department for evaluation of finger laceration.  Patient was cutting a ham when the knife slipped and cut his index finger.  Patient is on Xarelto.  Bleeding did stop with pressure.  Tetanus shot is not up-to-date.  Past Medical History:  Diagnosis Date  . Atrial fibrillation ([place])    first diagnosed 2011; s/p DCCV at that time; ablation 10-2013 & 03/2014; failed multaq, flecainide and amiodarone  . Chronic anticoagulation    Xarelto  . Chronic kidney disease    H/O KIDNEY STONES  . Dysrhythmia   . Headache   . Hyperlipidemia   . Hypertension   . Sinusitis     Patient Active Problem List   Diagnosis Date Noted  . OSA (obstructive sleep apnea) 04/06/2018  . Unstable angina ([place])   . A-fib ([place]) 07/04/2016  . Allergic rhinitis 03/08/2015  . HLD (hyperlipidemia) 03/08/2015  . Atrial fibrillation with rapid ventricular response ([place]) 07/04/2014  . Chronic anticoagulation - Xarelto   . Paroxysmal atrial fibrillation ([place]) 03/29/2014  . Essential hypertension 12/15/2013  . Atrial fibrillation ([place]) 06/13/2013    Past Surgical History:  Procedure Laterality Date  . APPENDECTOMY  1975  . ATRIAL FIBRILLATION ABLATION N/A 11/02/2013   PVI by Dr Rayann Heman  . ATRIAL FIBRILLATION ABLATION N/A 03/29/2014   PVI and CTI by Dr Rayann Heman  . ATRIAL FIBRILLATION ABLATION N/A 07/04/2016   Procedure: Atrial Fibrillation Ablation;  Surgeon: Thompson Grayer, MD;  Location: [place] [facility];  Service: Cardiovascular;  Laterality: N/A;  . CARDIOVERSION     Hx of  . CARDIOVERSION N/A 10/15/2013   Procedure: CARDIOVERSION;  Surgeon: Fay Records, MD;  Location: [place];   Service: Cardiovascular;  Laterality: N/A;  . COLONOSCOPY  2016  . CORONARY STENT INTERVENTION N/A 12/23/2017   Procedure: CORONARY STENT INTERVENTION;  Surgeon: Burnell Blanks, MD;  Location: [place] CV LAB;  Service: Cardiovascular;  Laterality: N/A;  . HERNIA REPAIR Left 05/28/2013   left inguinal henria repair, robotic direct defect repaired with Bard ultralight mesh.  Marland Kitchen HERNIA REPAIR Left 05/31/2015   Large Atrium pro loop mesh, direct defect.   . INGUINAL HERNIA REPAIR Left 05/31/2015   Procedure: HERNIA REPAIR INGUINAL ADULT;  Surgeon: Robert Bellow, MD;  Location: [facility] ORS;  Service: General;  Laterality: Left;  . KNEE SURGERY  2009   [facility]  . LITHOTRIPSY     x 3  last one  2013  . RHINOPLASTY  2006   Dr. Nadeen Landau, [facility]  . RIGHT/LEFT HEART CATH AND CORONARY ANGIOGRAPHY N/A 12/23/2017   Procedure: RIGHT/LEFT HEART CATH AND CORONARY ANGIOGRAPHY;  Surgeon: Burnell Blanks, MD;  Location: [place] CV LAB;  Service: Cardiovascular;  Laterality: N/A;  . TEE WITHOUT CARDIOVERSION N/A 11/01/2013   Procedure: TRANSESOPHAGEAL ECHOCARDIOGRAM (TEE);  Surgeon: Candee Furbish, MD;  Location: [facility] ENDOSCOPY;  Service: Cardiovascular;  Laterality: N/A;  . TEE WITHOUT CARDIOVERSION N/A 03/28/2014   Procedure: TRANSESOPHAGEAL ECHOCARDIOGRAM (TEE);  Surgeon: Larey Dresser, MD;  Location: [place];  Service: Cardiovascular;  Laterality: N/A;    Prior to Admission medications   Medication Sig Start Date End Date Taking? Authorizing Provider  amoxicillin-clavulanate (AUGMENTIN) [phone]  MG tablet Take 1 tablet by mouth 2 (two) times daily. 03/23/18   Jerrol Banana., MD  aspirin EC 81 MG tablet Take 1 tablet (81 mg total) by mouth daily. 10/10/16   Allred, Jeneen Rinks, MD  atorvastatin (LIPITOR) 80 MG tablet Take 1 tablet (80 mg total) by mouth daily. 12/24/17   Cheryln Manly, NP  cephALEXin (KEFLEX) 500 MG capsule Take 1 capsule (500 mg total) by  mouth 4 (four) times daily for 10 days. 04/22/18 05/02/18  Laban Emperor, PA-C  clopidogrel (PLAVIX) 75 MG tablet Take 1 tablet (75 mg total) by mouth daily with breakfast. 01/16/18   Cheryln Manly, NP  diltiazem (CARDIZEM CD) 180 MG 24 hr capsule Take 180 mg by mouth at bedtime.    [provider]  diltiazem (CARDIZEM) 30 MG tablet TAKE ONE TO TWO TABLETS EVERY 6 HOURS AS NEEDED FOR PALPITATIONS. 08/18/17   Allred, Jeneen Rinks, MD  dofetilide (TIKOSYN) 500 MCG capsule Take 1 capsule (500 mcg total) by mouth 2 (two) times daily. 04/09/17   Allred, Jeneen Rinks, MD  fluticasone (FLONASE) 50 MCG/ACT nasal spray Place into both nostrils daily.    [provider]  Levocetirizine Dihydrochloride (XYZAL ALLERGY 24HR PO) Take 1 tablet by mouth daily as needed (allergies).     [provider]  nitroGLYCERIN (NITROSTAT) 0.4 MG SL tablet Place 1 tablet (0.4 mg total) under the tongue every 5 (five) minutes as needed. 12/23/17   Cheryln Manly, NP  tamsulosin (FLOMAX) 0.4 MG CAPS capsule Take 0.4 mg by mouth as needed (PROSTATE).     [provider]    Allergies Oxycodone and Vicodin [hydrocodone-acetaminophen]  Family History  Problem Relation Age of Onset  . Cancer Mother        breast, cause of death  . Heart attack Maternal Grandfather 53       Cause of death  . Hypertension Maternal Grandfather   . Heart failure Brother   . CVA Paternal Grandmother        cause of death  . Atrial fibrillation Maternal Grandmother   . Colon polyps Other        Fam Hx  . Kidney cancer Neg Hx   . Bladder Cancer Neg Hx   . Prostate cancer Neg Hx     Social History Social History   Tobacco Use  . Smoking status: Never Smoker  . Smokeless tobacco: Never Used  Substance Use Topics  . Alcohol use: Yes    Alcohol/week: 2.0 standard drinks    Types: 2 Cans of beer per week    Comment: occasionally  . Drug use: No     Review of Systems  Constitutional: No  fever/chills Gastrointestinal: No nausea, no vomiting.  Musculoskeletal: Positive for finger pain.  Skin: Negative for rash, ecchymosis. Positive for laceration.  Neurological: Negative for numbness or tingling   ____________________________________________   PHYSICAL EXAM:  VITAL SIGNS: ED Triage Vitals  Enc Vitals Group     BP 04/22/18 1500 (!) 144/2     Pulse Rate 04/22/18 1500 (!) 110     Resp 04/22/18 1500 20     Temp 04/22/18 1500 98.5 F (36.9 C)     Temp Source 04/22/18 1500 Oral     SpO2 04/22/18 1500 97 %     Weight 04/22/18 1501 185 lb (83.9 kg)     Height 04/22/18 1501 5\' 10"  (1.778 m)     Head Circumference --      Peak Flow --  Pain Score 04/22/18 1501 2     Pain Loc --      Pain Edu? --      Excl. in [place]? --      Constitutional: Alert and oriented. Well appearing and in no acute distress. Eyes: Conjunctivae are normal. PERRL. EOMI. Head: Atraumatic. ENT:      Ears:      Nose: No congestion/rhinnorhea.      Mouth/Throat: Mucous membranes are moist.  Neck: No stridor.  Cardiovascular: Normal rate, regular rhythm.  Good peripheral circulation. Respiratory: Normal respiratory effort without tachypnea or retractions. Lungs CTAB. Good air entry to the bases with no decreased or absent breath sounds. Musculoskeletal: Full range of motion to all extremities. No gross deformities appreciated.  Able to perform resisted flexion and extension of finger. Neurologic:  Normal speech and language. No gross focal neurologic deficits are appreciated.  Skin:  Skin is warm, dry. 2 cm laceration to left index finger Psychiatric: Mood and affect are normal. Speech and behavior are normal. Patient exhibits appropriate insight and judgement.   ____________________________________________   LABS (all labs ordered are listed, but only abnormal results are displayed)  Labs Reviewed - No data to  display ____________________________________________  EKG   ____________________________________________  RADIOLOGY   No results found.  ____________________________________________    PROCEDURES  Procedure(s) performed:    Procedures  LACERATION REPAIR Performed by: Laban Emperor  Consent: Verbal consent obtained.  Consent given by: patient  Prepped and Draped in normal sterile fashion  Wound explored: No foreign bodies   Laceration Location: finger  Laceration Length: 2 cm  Anesthesia: None  Local anesthetic: lidocaine 1% without epinephrine  Anesthetic total: 4 ml  Irrigation method: syringe  Amount of cleaning: 538ml normal saline  Skin closure: 4-0 nylon  Number of sutures: 5  Technique: Simple interrupted  Patient tolerance: Patient tolerated the procedure well with no immediate complications.  Medications  lidocaine (PF) (XYLOCAINE) 1 % injection (  Given by Other 04/22/18 1653)  Tdap (BOOSTRIX) injection 0.5 mL (0.5 mLs Intramuscular Given 04/22/18 1645)  cephALEXin (KEFLEX) capsule 500 mg (500 mg Oral Given 04/22/18 1645)     ____________________________________________   INITIAL IMPRESSION / ASSESSMENT AND PLAN / ED COURSE  Pertinent labs & imaging results that were available during my care of the patient were reviewed by me and considered in my medical decision making (see chart for details).  Review of the [place] CSRS was performed in accordance of the [place] prior to dispensing any controlled drugs.   Patient's diagnosis is consistent with finger laceration.  Wound was repaired with stitches.  Bleeding controlled.  Tetanus shot is updated.  Patient will be discharged home with prescriptions for keflex. Patient is to follow up with primary care as directed. Patient is given ED precautions to return to the ED for any worsening or new symptoms.     ____________________________________________  FINAL CLINICAL IMPRESSION(S) / ED  DIAGNOSES  Final diagnoses:  Laceration of left index finger without foreign body without damage to nail, initial encounter      NEW MEDICATIONS STARTED DURING THIS VISIT:  ED Discharge Orders         Ordered    cephALEXin (KEFLEX) 500 MG capsule  4 times daily     04/22/18 1655              This chart was dictated using voice recognition software/Dragon. Despite best efforts to proofread, errors can occur which can change the meaning. Any change was  purely unintentional.    Laban Emperor, PA-C 04/22/18 1658    Earleen Newport, MD 04/23/18 [phone]

## 2018-04-22 NOTE — ED Notes (Signed)
AAOx3.  Skin warm and dry.  NAD 

## 2018-04-30 NOTE — Telephone Encounter (Signed)
The new treatment which is high-dose statin.  Cardiologist do not  even look at the LDL as low as he is on maximum dose statin which he is

## 2018-05-01 NOTE — Telephone Encounter (Signed)
Patient advised.KW 

## 2018-05-06 ENCOUNTER — Ambulatory Visit: Payer: BC Managed Care – PPO | Admitting: Urology

## 2018-05-11 ENCOUNTER — Ambulatory Visit: Payer: BC Managed Care – PPO | Admitting: Internal Medicine

## 2018-05-15 ENCOUNTER — Ambulatory Visit: Payer: BC Managed Care – PPO | Admitting: Internal Medicine

## 2018-05-15 ENCOUNTER — Encounter: Payer: Self-pay | Admitting: Internal Medicine

## 2018-05-15 VITALS — BP 116/74 | HR 121 | Ht 70.0 in | Wt 193.2 lb

## 2018-05-15 DIAGNOSIS — I2510 Atherosclerotic heart disease of native coronary artery without angina pectoris: Secondary | ICD-10-CM | POA: Diagnosis not present

## 2018-05-15 DIAGNOSIS — I10 Essential (primary) hypertension: Secondary | ICD-10-CM

## 2018-05-15 DIAGNOSIS — I4819 Other persistent atrial fibrillation: Secondary | ICD-10-CM

## 2018-05-15 DIAGNOSIS — Z955 Presence of coronary angioplasty implant and graft: Secondary | ICD-10-CM

## 2018-05-15 LAB — MAGNESIUM: Magnesium: 2.2 mg/dL (ref 1.6–2.3)

## 2018-05-15 MED ORDER — ASPIRIN EC 81 MG PO TBEC: 81.0000 mg | DELAYED_RELEASE_TABLET | Freq: Every day | ORAL | 0 refills | Status: AC

## 2018-05-15 MED ORDER — RIVAROXABAN 20 MG PO TABS: 20.0000 mg | ORAL_TABLET | Freq: Every day | ORAL | 3 refills | Status: DC

## 2018-05-15 MED ORDER — DILTIAZEM HCL ER COATED BEADS 240 MG PO CP24: 240.0000 mg | ORAL_CAPSULE | Freq: Every day | ORAL | 3 refills | Status: DC

## 2018-05-15 NOTE — Progress Notes (Signed)
It would be ideal to continue the Plavix for one full year if he tolerates it ok. I am ok with stopping ASA. Jorge [name]

## 2018-05-15 NOTE — Patient Instructions (Addendum)
Medication Instructions:  Your physician has recommended you make the following change in your medication:   1.  Stop taking aspirin on June 25, 2018  2.  Start taking Xarelto 20 mg on June 25, 2018--Take one tablet by mouth daily with your largest meal  3.  Increase your diltiazem--Take 240 mg capsule by mouth daily    Labwork: You will get lab work today:  magnesium  Testing/Procedures: None ordered.  Follow-Up: Your physician wants you to follow-up in: 6 months with Dr. Rayann Heman.   You will receive a reminder letter in the mail two months in advance. If you don't receive a letter, please call our office to schedule the follow-up appointment.   Any Other Special Instructions Will Be Listed Below (If Applicable).  If you need a refill on your cardiac medications before your next appointment, please call your pharmacy.

## 2018-05-15 NOTE — Progress Notes (Addendum)
Electrophysiology Office Note Date: 05/15/2018  ID:  Jorge [name], DOB [birth year], MRN [id number]  PCP: Jerrol Banana., MD Primary Cardiologist: Dr Angelena Form Electrophysiologist: Dr Rayann Heman  CC: Follow up for atrial fibrillation   Jorge [name] is a 53 y.o. male seen today for routine electrophysiology followup.  Since last being seen in our clinic, the patient reports doing very well.  He has done well since his recent PCI of his LAD. He reports his SOB has improved. He does report that his BP monitor shows he is out of rhythm about 3 days per week although he has been fairly asymptomatic. He is in atrial fibrillation today with HR 121 and was asymptomatic.  He denies chest pain, palpitations, dyspnea, PND, orthopnea, nausea, vomiting, dizziness, syncope, edema, weight gain, or early satiety.  Past Medical History:  Diagnosis Date  . Atrial fibrillation ([place])    first diagnosed 2011; s/p DCCV at that time; ablation 10-2013 & 03/2014; failed multaq, flecainide and amiodarone  . Chronic anticoagulation    Xarelto  . Chronic kidney disease    H/O KIDNEY STONES  . Dysrhythmia   . Headache   . Hyperlipidemia   . Hypertension   . Sinusitis    Past Surgical History:  Procedure Laterality Date  . APPENDECTOMY  1975  . ATRIAL FIBRILLATION ABLATION N/A 11/02/2013   PVI by Dr Rayann Heman  . ATRIAL FIBRILLATION ABLATION N/A 03/29/2014   PVI and CTI by Dr Rayann Heman  . ATRIAL FIBRILLATION ABLATION N/A 07/04/2016   Procedure: Atrial Fibrillation Ablation;  Surgeon: Thompson Grayer, MD;  Location: [place] CV LAB;  Service: Cardiovascular;  Laterality: N/A;  . CARDIOVERSION     Hx of  . CARDIOVERSION N/A 10/15/2013   Procedure: CARDIOVERSION;  Surgeon: Fay Records, MD;  Location: [place];  Service: Cardiovascular;  Laterality: N/A;  . COLONOSCOPY  2016  . CORONARY STENT INTERVENTION N/A 12/23/2017   Procedure: CORONARY STENT INTERVENTION;  Surgeon: Burnell Blanks,  MD;  Location: [place] CV LAB;  Service: Cardiovascular;  Laterality: N/A;  . HERNIA REPAIR Left 05/28/2013   left inguinal henria repair, robotic direct defect repaired with Bard ultralight mesh.  Marland Kitchen HERNIA REPAIR Left 05/31/2015   Large Atrium pro loop mesh, direct defect.   . INGUINAL HERNIA REPAIR Left 05/31/2015   Procedure: HERNIA REPAIR INGUINAL ADULT;  Surgeon: Robert Bellow, MD;  Location: [facility] ORS;  Service: General;  Laterality: Left;  . KNEE SURGERY  2009   [facility]  . LITHOTRIPSY     x 3  last one  2013  . RHINOPLASTY  2006   Dr. Nadeen Landau, [facility]  . RIGHT/LEFT HEART CATH AND CORONARY ANGIOGRAPHY N/A 12/23/2017   Procedure: RIGHT/LEFT HEART CATH AND CORONARY ANGIOGRAPHY;  Surgeon: Burnell Blanks, MD;  Location: [place] CV LAB;  Service: Cardiovascular;  Laterality: N/A;  . TEE WITHOUT CARDIOVERSION N/A 11/01/2013   Procedure: TRANSESOPHAGEAL ECHOCARDIOGRAM (TEE);  Surgeon: Candee Furbish, MD;  Location: [facility] ENDOSCOPY;  Service: Cardiovascular;  Laterality: N/A;  . TEE WITHOUT CARDIOVERSION N/A 03/28/2014   Procedure: TRANSESOPHAGEAL ECHOCARDIOGRAM (TEE);  Surgeon: Larey Dresser, MD;  Location: [facility] ENDOSCOPY;  Service: Cardiovascular;  Laterality: N/A;    Current Outpatient Medications  Medication Sig Dispense Refill  . amoxicillin-clavulanate (AUGMENTIN) 875-125 MG tablet Take 1 tablet by mouth 2 (two) times daily. 20 tablet 0  . aspirin EC 81 MG tablet Take 1 tablet (81 mg total) by mouth  daily. 90 tablet 3  . atorvastatin (LIPITOR) 80 MG tablet Take 1 tablet (80 mg total) by mouth daily. 90 tablet 1  . clopidogrel (PLAVIX) 75 MG tablet Take 1 tablet (75 mg total) by mouth daily with breakfast. 90 tablet 1  . diltiazem (CARDIZEM CD) 180 MG 24 hr capsule Take 180 mg by mouth at bedtime.    Marland Kitchen diltiazem (CARDIZEM) 30 MG tablet TAKE ONE TO TWO TABLETS EVERY 6 HOURS AS NEEDED FOR PALPITATIONS. 30 tablet 6  . dofetilide (TIKOSYN) 500 MCG capsule  Take 1 capsule (500 mcg total) by mouth 2 (two) times daily. 180 capsule 3  . fluticasone (FLONASE) 50 MCG/ACT nasal spray Place into both nostrils daily.    . Levocetirizine Dihydrochloride (XYZAL ALLERGY 24HR PO) Take 1 tablet by mouth daily as needed (allergies).     . nitroGLYCERIN (NITROSTAT) 0.4 MG SL tablet Place 1 tablet (0.4 mg total) under the tongue every 5 (five) minutes as needed. 25 tablet 2  . tamsulosin (FLOMAX) 0.4 MG CAPS capsule Take 0.4 mg by mouth as needed (PROSTATE).      No current facility-administered medications for this visit.     Allergies:   Oxycodone and Vicodin [hydrocodone-acetaminophen]   Social History: Social History   Socioeconomic History  . Marital status: Married    Spouse name: Not on file  . Number of children: Not on file  . Years of education: Not on file  . Highest education level: Not on file  Occupational History  . Occupation: Passenger transport manager w/ [employer]  Social Needs  . Financial resource strain: Not on file  . Food insecurity:    Worry: Not on file    Inability: Not on file  . Transportation needs:    Medical: Not on file    Non-medical: Not on file  Tobacco Use  . Smoking status: Never Smoker  . Smokeless tobacco: Never Used  Substance and Sexual Activity  . Alcohol use: Yes    Alcohol/week: 2.0 standard drinks    Types: 2 Cans of beer per week    Comment: occasionally  . Drug use: No  . Sexual activity: Yes  Lifestyle  . Physical activity:    Days per week: Not on file    Minutes per session: Not on file  . Stress: Not on file  Relationships  . Social connections:    Talks on phone: Not on file    Gets together: Not on file    Attends religious service: Not on file    Active member of club or organization: Not on file    Attends meetings of clubs or organizations: Not on file    Relationship status: Not on file  . Intimate partner violence:    Fear of current or ex partner: Not on file    Emotionally  abused: Not on file    Physically abused: Not on file    Forced sexual activity: Not on file  Other Topics Concern  . Not on file  Social History Narrative   Lives in [place] with his spouse.  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[employer]    Family History: Family History  Problem Relation Age of Onset  . Cancer Mother        breast, cause of death  . Heart attack Maternal Grandfather 53       Cause of death  . Hypertension Maternal Grandfather   . Heart failure Brother   . CVA Paternal Grandmother  cause of death  . Atrial fibrillation Maternal Grandmother   . Colon polyps Other        Fam Hx  . Kidney cancer Neg Hx   . Bladder Cancer Neg Hx   . Prostate cancer Neg Hx     Review of Systems: All other systems reviewed and are otherwise negative except as noted above.   Physical Exam: VS:  BP 116/74   Pulse (!) 121   Ht 5\' 10"  (1.778 m)   Wt 193 lb 3.2 oz (87.6 kg)   SpO2 99%   BMI 27.72 kg/m  , BMI Body mass index is 27.72 kg/m. Wt Readings from Last 3 Encounters:  05/15/18 193 lb 3.2 oz (87.6 kg)  04/22/18 185 lb (83.9 kg)  03/31/18 189 lb (85.7 kg)    GEN- The patient is well appearing, alert and oriented x 3 today.   HEENT: normocephalic, atraumatic; sclera clear, conjunctiva pink; hearing intact; oropharynx clear; neck supple, no JVP Lymph- no cervical lymphadenopathy Lungs- Clear to ausculation bilaterally, normal work of breathing.  No wheezes, rales, rhonchi Heart- irregularly irregular rate and rhythm, no murmurs, rubs or gallops, PMI not laterally displaced GI- soft, non-tender, non-distended, bowel sounds present, no hepatosplenomegaly Extremities- no clubbing, cyanosis, or edema; DP/PT/radial pulses 2+ bilaterally MS- no significant deformity or atrophy Skin- warm and dry, no rash or lesion  Psych- euthymic mood, full affect Neuro- strength and sensation are intact   EKG:  EKG is ordered today. The ekg ordered today shows atrial  fibrillation HR 121  Recent Labs: 03/31/2018: ALT 35; BUN 13; Creatinine, Ser 0.85; Hemoglobin 12.9; Platelets 263; Potassium 4.3; Sodium 141; TSH 0.946    Other studies Reviewed: Additional studies/ records that were reviewed today include: Dr Camillia Herter notes  Assessment and Plan:  1. Persistent atrial fibrillation He appears to be in and out of rhythm often with minimal symptoms.  He reports that his HR does get fast at times when he checks his BP or his smart watch. Will increase his diltiazem to 240 mg daily Continue diltiazem 30 mg q4-6 hrs PRN for heart racing Continue dofetilide 500 mcg BID. QT stable Recent Bmet with PCP looked good. Will check magnesium. Will soon reach 6 months post PCI. Will change ASA to Xarelto 20 mg daily at that time. We discussed referral to Dr Mervyn Skeeters for consideration of Convergent procedure.  He will consider this option but is not ready to proceed.  This patients CHA2DS2-VASc Score and unadjusted Ischemic Stroke Rate (% per year) is equal to 2.2 % stroke rate/year from a score of 2  Above score calculated as 1 point each if present [CHF, HTN, DM, Vascular=MI/PAD/Aortic Plaque, Age if 65-74, or Male] Above score calculated as 2 points each if present [Age > 75, or Stroke/TIA/TE]   2. CAD S/p PCI of LAD with Dr Angelena Form No anginal symptoms.  Med changes as above. I will ask Dr Angelena Form when he feels that we can stop plavix.  3. HTN Overall stable, does have some higher readings in the evening. Will increase his diltiazem as above.   Current medicines are reviewed at length with the patient today.   The patient does not have concerns regarding his medicines.  The following changes were made today:  increase diltiazem, stop ASA, start Xarelto 6 months post PCI  Labs/ tests ordered today include:  Orders Placed This Encounter  Procedures  . Magnesium  . EKG 12-Lead     Disposition:   Follow up with  me in 6 months. Dr Angelena Form as  scheduled    Signed, Malka So PA-C 05/15/2018 9:53 AM   [address] [place] [id number] [phone] (office) [phone] (fax)   Addendum Per Dr Angelena Form, will plan to continue plavix for a total of 1 years duration.

## 2018-05-26 ENCOUNTER — Ambulatory Visit (INDEPENDENT_AMBULATORY_CARE_PROVIDER_SITE_OTHER): Payer: BC Managed Care – PPO | Admitting: Family Medicine

## 2018-05-26 ENCOUNTER — Encounter: Payer: Self-pay | Admitting: Family Medicine

## 2018-05-26 VITALS — BP 114/74 | HR 102 | Temp 102.1°F | Resp 16 | Wt 195.0 lb

## 2018-05-26 DIAGNOSIS — R6889 Other general symptoms and signs: Secondary | ICD-10-CM

## 2018-05-26 LAB — POCT INFLUENZA A/B
Influenza A, POC: NEGATIVE
Influenza B, POC: NEGATIVE

## 2018-05-26 LAB — POCT RAPID STREP A (OFFICE): Rapid Strep A Screen: NEGATIVE

## 2018-05-26 MED ORDER — OSELTAMIVIR PHOSPHATE 75 MG PO CAPS: 75.0000 mg | ORAL_CAPSULE | Freq: Two times a day (BID) | ORAL | 0 refills | Status: DC

## 2018-05-26 NOTE — Progress Notes (Signed)
Patient: Jorge [name] Male    DOB: 1966/02/05   53 y.o.   MRN: [id number] Visit Date: 05/26/2018  Today's Provider: Wilhemena Durie, MD   Chief Complaint  Patient presents with  . URI   Subjective:     URI   This is a new problem. The current episode started yesterday. The problem has been gradually worsening. The maximum temperature recorded prior to his arrival was 102 - 102.9 F. Associated symptoms include congestion, coughing, ear pain, headaches and a sore throat. He has tried NSAIDs for the symptoms. The treatment provided mild relief.  Patient had abrupt onset of the symptoms last night.  Main symptoms today are fever and body aches.  He also has a significant sore throat which he says hurts a lot.  Headache along the temporal areas.  Minimal cough and no shortness of breath or hemoptysis.  Allergies  Allergen Reactions  . Oxycodone Nausea And Vomiting  . Vicodin [Hydrocodone-Acetaminophen] Nausea And Vomiting    Can take regular Tylenol     Current Outpatient Medications:  .  aspirin EC 81 MG tablet, Take 1 tablet (81 mg total) by mouth daily., Disp: 41 tablet, Rfl: 0 .  atorvastatin (LIPITOR) 80 MG tablet, Take 1 tablet (80 mg total) by mouth daily., Disp: 90 tablet, Rfl: 1 .  clopidogrel (PLAVIX) 75 MG tablet, Take 1 tablet (75 mg total) by mouth daily with breakfast., Disp: 90 tablet, Rfl: 1 .  diltiazem (CARDIZEM CD) 240 MG 24 hr capsule, Take 1 capsule (240 mg total) by mouth daily., Disp: 90 capsule, Rfl: 3 .  diltiazem (CARDIZEM) 30 MG tablet, TAKE ONE TO TWO TABLETS EVERY 6 HOURS AS NEEDED FOR PALPITATIONS., Disp: 30 tablet, Rfl: 6 .  dofetilide (TIKOSYN) 500 MCG capsule, Take 1 capsule (500 mcg total) by mouth 2 (two) times daily., Disp: 180 capsule, Rfl: 3 .  fluticasone (FLONASE) 50 MCG/ACT nasal spray, Place into both nostrils daily., Disp: , Rfl:  .  Levocetirizine Dihydrochloride (XYZAL ALLERGY 24HR PO), Take 1 tablet by mouth daily as needed  (allergies). , Disp: , Rfl:  .  nitroGLYCERIN (NITROSTAT) 0.4 MG SL tablet, Place 1 tablet (0.4 mg total) under the tongue every 5 (five) minutes as needed., Disp: 25 tablet, Rfl: 2 .  [START ON 06/25/2018] rivaroxaban (XARELTO) 20 MG TABS tablet, Take 1 tablet (20 mg total) by mouth daily with supper., Disp: 90 tablet, Rfl: 3 .  tamsulosin (FLOMAX) 0.4 MG CAPS capsule, Take 0.4 mg by mouth as needed (PROSTATE). , Disp: , Rfl:  .  amoxicillin-clavulanate (AUGMENTIN) 875-125 MG tablet, Take 1 tablet by mouth 2 (two) times daily. (Patient not taking: Reported on 05/26/2018), Disp: 20 tablet, Rfl: 0  Review of Systems  Constitutional: Positive for fever.  HENT: Positive for congestion, ear pain, sore throat and trouble swallowing.   Eyes: Positive for pain.  Respiratory: Positive for cough.   Cardiovascular: Negative.   Gastrointestinal: Negative.   Allergic/Immunologic: Negative.   Neurological: Positive for headaches.  Psychiatric/Behavioral: Negative.     Social History   Tobacco Use  . Smoking status: Never Smoker  . Smokeless tobacco: Never Used  Substance Use Topics  . Alcohol use: Yes    Alcohol/week: 2.0 standard drinks    Types: 2 Cans of beer per week    Comment: occasionally      Objective:   BP 114/74 (BP Location: Left Arm, Patient Position: Sitting, Cuff Size: Normal)   Pulse Marland Kitchen)  102   Temp (!) 102.1 F (38.9 C)   Resp 16   Wt 195 lb (88.5 kg)   SpO2 96%   BMI 27.98 kg/m  Vitals:   05/26/18 1639  BP: 114/74  Pulse: (!) 102  Resp: 16  Temp: (!) 102.1 F (38.9 C)  SpO2: 96%  Weight: 195 lb (88.5 kg)     Physical Exam Constitutional:      Appearance: He is well-developed.  HENT:     Head: Normocephalic and atraumatic.     Right Ear: External ear normal.     Left Ear: External ear normal.     Nose: Nose normal.     Mouth/Throat:     Mouth: Mucous membranes are moist.     Pharynx: Oropharynx is clear. No oropharyngeal exudate.  Eyes:     General:  No scleral icterus.    Conjunctiva/sclera: Conjunctivae normal.  Neck:     Musculoskeletal: No neck rigidity.     Thyroid: No thyromegaly.  Cardiovascular:     Rate and Rhythm: Normal rate and regular rhythm.     Heart sounds: Normal heart sounds.  Pulmonary:     Effort: Pulmonary effort is normal.     Breath sounds: Normal breath sounds.  Musculoskeletal:     Right lower leg: No edema.     Left lower leg: No edema.  Lymphadenopathy:     Cervical: No cervical adenopathy.  Skin:    General: Skin is warm and dry.  Neurological:     General: No focal deficit present.     Mental Status: He is alert and oriented to person, place, and time. Mental status is at baseline.  Psychiatric:        Behavior: Behavior normal.        Thought Content: Thought content normal.        Judgment: Judgment normal.         Assessment & Plan    1. Flu-like symptoms Influenza swab for a and B both negative.  Strep swab also negative and it does not look like strep throat.  At this time clinically I think the patient has a flu.  We will treat him with Tamiflu and give his wife prophylaxis.  If at any point time he feels worse or develops shortness of breath or worsening cough will obtain chest x-ray and lab work and see him back. He has full range of motion his neck and there is no indication of any meningitis symptoms at all.  This is all most consistent with influenza.  Patient is in agreement and comfortable with the plan. - POCT Influenza A/B - POCT rapid strep A     Wilhemena Durie, MD  [facility]

## 2018-05-29 ENCOUNTER — Encounter: Payer: Self-pay | Admitting: Family Medicine

## 2018-06-01 ENCOUNTER — Other Ambulatory Visit: Payer: Self-pay

## 2018-06-01 ENCOUNTER — Ambulatory Visit: Payer: BC Managed Care – PPO | Admitting: Family Medicine

## 2018-06-01 VITALS — BP 112/78 | HR 85 | Temp 98.3°F | Resp 16 | Wt 189.0 lb

## 2018-06-01 DIAGNOSIS — J329 Chronic sinusitis, unspecified: Secondary | ICD-10-CM | POA: Diagnosis not present

## 2018-06-01 DIAGNOSIS — H109 Unspecified conjunctivitis: Secondary | ICD-10-CM | POA: Diagnosis not present

## 2018-06-01 MED ORDER — GENTAMICIN SULFATE 0.3 % OP SOLN: 1.0000 [drp] | OPHTHALMIC | 0 refills | Status: DC

## 2018-06-01 MED ORDER — AMOXICILLIN-POT CLAVULANATE 875-125 MG PO TABS: 1.0000 | ORAL_TABLET | Freq: Two times a day (BID) | ORAL | 0 refills | Status: DC

## 2018-06-01 NOTE — Progress Notes (Signed)
Jorge [name]  MRN: [id number] DOB: [birth year]  Subjective:  HPI   Patient is a 53 year old male who presents for follow up after being seen last week and treated for flu like symptoms.  He states his fever broke on Wed and he had low grade fever until Sat.  He has not had any fever since.  He does have cough with head congestion.  His eyes are red and bothering him.  He states he has had multiple sinus infections over the last year and thinks he may need to see the ENT.  Patient Active Problem List   Diagnosis Date Noted  . OSA (obstructive sleep apnea) 04/06/2018  . Unstable angina ([place])   . A-fib ([place]) 07/04/2016  . Allergic rhinitis 03/08/2015  . HLD (hyperlipidemia) 03/08/2015  . Atrial fibrillation with rapid ventricular response ([place]) 07/04/2014  . Chronic anticoagulation - Xarelto   . Paroxysmal atrial fibrillation ([place]) 03/29/2014  . Essential hypertension 12/15/2013  . Atrial fibrillation ([place]) 06/13/2013    Past Medical History:  Diagnosis Date  . Atrial fibrillation ([place])    first diagnosed 2011; s/p DCCV at that time; ablation 10-2013 & 03/2014; failed multaq, flecainide and amiodarone  . Chronic anticoagulation    Xarelto  . Chronic kidney disease    H/O KIDNEY STONES  . Dysrhythmia   . Headache   . Hyperlipidemia   . Hypertension   . Sinusitis     Social History   Socioeconomic History  . Marital status: Married    Spouse name: Not on file  . Number of children: Not on file  . Years of education: Not on file  . Highest education level: Not on file  Occupational History  . Occupation: Passenger transport manager w/ [employer]  Social Needs  . Financial resource strain: Not on file  . Food insecurity:    Worry: Not on file    Inability: Not on file  . Transportation needs:    Medical: Not on file    Non-medical: Not on file  Tobacco Use  . Smoking status: Never Smoker  . Smokeless tobacco: Never Used  Substance and Sexual Activity  . Alcohol use:  Yes    Alcohol/week: 2.0 standard drinks    Types: 2 Cans of beer per week    Comment: occasionally  . Drug use: No  . Sexual activity: Yes  Lifestyle  . Physical activity:    Days per week: Not on file    Minutes per session: Not on file  . Stress: Not on file  Relationships  . Social connections:    Talks on phone: Not on file    Gets together: Not on file    Attends religious service: Not on file    Active member of club or organization: Not on file    Attends meetings of clubs or organizations: Not on file    Relationship status: Not on file  . Intimate partner violence:    Fear of current or ex partner: Not on file    Emotionally abused: Not on file    Physically abused: Not on file    Forced sexual activity: Not on file  Other Topics Concern  . Not on file  Social History Narrative   Lives in [place] with his spouse.  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[employer]    Outpatient Encounter Medications as of 06/01/2018  Medication Sig  . aspirin EC 81 MG tablet Take 1 tablet (81 mg  total) by mouth daily.  Marland Kitchen atorvastatin (LIPITOR) 80 MG tablet Take 1 tablet (80 mg total) by mouth daily.  . clopidogrel (PLAVIX) 75 MG tablet Take 1 tablet (75 mg total) by mouth daily with breakfast.  . diltiazem (CARDIZEM CD) 240 MG 24 hr capsule Take 1 capsule (240 mg total) by mouth daily.  Marland Kitchen diltiazem (CARDIZEM) 30 MG tablet TAKE ONE TO TWO TABLETS EVERY 6 HOURS AS NEEDED FOR PALPITATIONS.  Marland Kitchen dofetilide (TIKOSYN) 500 MCG capsule Take 1 capsule (500 mcg total) by mouth 2 (two) times daily.  . fluticasone (FLONASE) 50 MCG/ACT nasal spray Place into both nostrils daily.  . Levocetirizine Dihydrochloride (XYZAL ALLERGY 24HR PO) Take 1 tablet by mouth daily as needed (allergies).   . nitroGLYCERIN (NITROSTAT) 0.4 MG SL tablet Place 1 tablet (0.4 mg total) under the tongue every 5 (five) minutes as needed.  . tamsulosin (FLOMAX) 0.4 MG CAPS capsule Take 0.4 mg by mouth as needed  (PROSTATE).   Derrill Memo ON 06/25/2018] rivaroxaban (XARELTO) 20 MG TABS tablet Take 1 tablet (20 mg total) by mouth daily with supper. (Patient not taking: Reported on 06/01/2018)  . [DISCONTINUED] amoxicillin-clavulanate (AUGMENTIN) 875-125 MG tablet Take 1 tablet by mouth 2 (two) times daily. (Patient not taking: Reported on 05/26/2018)  . [DISCONTINUED] oseltamivir (TAMIFLU) 75 MG capsule Take 1 capsule (75 mg total) by mouth 2 (two) times daily.   No facility-administered encounter medications on file as of 06/01/2018.     Allergies  Allergen Reactions  . Oxycodone Nausea And Vomiting  . Vicodin [Hydrocodone-Acetaminophen] Nausea And Vomiting    Can take regular Tylenol    Review of Systems  Constitutional: Positive for malaise/fatigue. Negative for chills and fever.  HENT: Positive for congestion, nosebleeds, sinus pain and sore throat. Negative for ear discharge, ear pain, hearing loss and tinnitus.   Eyes: Positive for blurred vision and pain (irritated). Negative for double vision and photophobia.  Respiratory: Positive for cough, sputum production and shortness of breath. Negative for hemoptysis and wheezing.   Cardiovascular: Negative for chest pain, palpitations, orthopnea, claudication and leg swelling.  Gastrointestinal: Negative.   Skin: Negative.     Objective:  BP 112/78 (BP Location: Right Arm, Patient Position: Sitting, Cuff Size: Normal)   Pulse 85   Temp 98.3 F (36.8 C) (Oral)   Resp 16   Wt 189 lb (85.7 kg)   SpO2 98%   BMI 27.12 kg/m   Physical Exam  Constitutional: He is oriented to person, place, and time and well-developed, well-nourished, and in no distress.  HENT:  Head: Normocephalic and atraumatic.  Right Ear: External ear normal.  Left Ear: External ear normal.  Mouth/Throat: Oropharynx is clear and moist.  Mild frontal sinus tenderness.  Eyes: Pupils are equal, round, and reactive to light. Conjunctivae are normal. Right eye exhibits discharge. No  scleral icterus.  Neck: Normal range of motion. Neck supple. No tracheal deviation present. No thyromegaly present.  Cardiovascular: Normal rate, regular rhythm, normal heart sounds and intact distal pulses. Exam reveals no gallop.  No murmur heard. Pulmonary/Chest: Effort normal and breath sounds normal. No respiratory distress. He has no wheezes.  Musculoskeletal:        General: No tenderness or edema.  Neurological: He is alert and oriented to person, place, and time. Gait normal. GCS score is 15.  Skin: Skin is warm and dry.  Psychiatric: Mood, memory, affect and judgment normal.    Assessment and Plan :  1. Sinusitis, unspecified chronicity, unspecified location  -  amoxicillin-clavulanate (AUGMENTIN) 875-125 MG tablet; Take 1 tablet by mouth 2 (two) times daily.  Dispense: 20 tablet; Refill: 0  2. Conjunctivitis of both eyes, unspecified conjunctivitis type  - gentamicin (GARAMYCIN) 0.3 % ophthalmic solution; Place 1 drop into both eyes every 4 (four) hours.  Dispense: 5 mL; Refill: 0  3. Recurrent sinusitis  - Ambulatory referral to ENT  I have done the exam and reviewed the chart and it is accurate to the best of my knowledge. Development worker, community has been used and  any errors in dictation or transcription are unintentional. Miguel Aschoff M.D. [facility]

## 2018-06-03 ENCOUNTER — Other Ambulatory Visit: Payer: Self-pay | Admitting: Internal Medicine

## 2018-06-15 ENCOUNTER — Other Ambulatory Visit: Payer: Self-pay | Admitting: Internal Medicine

## 2018-06-16 ENCOUNTER — Other Ambulatory Visit: Payer: Self-pay | Admitting: Internal Medicine

## 2018-06-17 ENCOUNTER — Other Ambulatory Visit: Payer: Self-pay | Admitting: Cardiology

## 2018-06-17 ENCOUNTER — Other Ambulatory Visit (HOSPITAL_COMMUNITY): Payer: Self-pay | Admitting: Cardiology

## 2018-06-24 ENCOUNTER — Other Ambulatory Visit: Payer: Self-pay | Admitting: Internal Medicine

## 2018-06-29 ENCOUNTER — Ambulatory Visit (INDEPENDENT_AMBULATORY_CARE_PROVIDER_SITE_OTHER): Payer: BC Managed Care – PPO | Admitting: Family Medicine

## 2018-06-29 ENCOUNTER — Other Ambulatory Visit: Payer: Self-pay

## 2018-06-29 ENCOUNTER — Encounter: Payer: Self-pay | Admitting: Family Medicine

## 2018-06-29 VITALS — BP 144/92 | HR 77 | Temp 98.3°F | Ht 70.0 in | Wt 198.6 lb

## 2018-06-29 DIAGNOSIS — I4891 Unspecified atrial fibrillation: Secondary | ICD-10-CM | POA: Diagnosis not present

## 2018-06-29 DIAGNOSIS — I200 Unstable angina: Secondary | ICD-10-CM

## 2018-06-29 DIAGNOSIS — G4733 Obstructive sleep apnea (adult) (pediatric): Secondary | ICD-10-CM

## 2018-06-29 DIAGNOSIS — Z87442 Personal history of urinary calculi: Secondary | ICD-10-CM | POA: Diagnosis not present

## 2018-06-29 DIAGNOSIS — Z7901 Long term (current) use of anticoagulants: Secondary | ICD-10-CM

## 2018-06-29 DIAGNOSIS — I10 Essential (primary) hypertension: Secondary | ICD-10-CM | POA: Diagnosis not present

## 2018-06-29 LAB — POCT URINALYSIS DIPSTICK
Bilirubin, UA: NEGATIVE
Glucose, UA: NEGATIVE
Ketones, UA: NEGATIVE
Leukocytes, UA: NEGATIVE
Nitrite, UA: NEGATIVE
PROTEIN UA: NEGATIVE
Spec Grav, UA: <=1.005 — AB (ref 1.010–1.025)
Urobilinogen, UA: 0.2 E.U./dL
pH, UA: 6.5 (ref 5.0–8.0)

## 2018-06-29 MED ORDER — LOSARTAN POTASSIUM 50 MG PO TABS: 50.0000 mg | ORAL_TABLET | Freq: Every day | ORAL | 3 refills | Status: DC

## 2018-06-29 NOTE — Progress Notes (Signed)
Patient: Jorge [name] Male    DOB: 11-07-1965   53 y.o.   MRN: [id number] Visit Date: 06/29/2018  Today's Provider: Wilhemena Durie, MD   Chief Complaint  Patient presents with  . Hypertension    3 month fup  . Hyperlipidemia    3 month fup   Subjective:     HPI   Hypertension, and hyperlipidemia 3 month follow-up:  BP Readings from Last 3 Encounters:  06/29/18 (!) 144/92  06/01/18 112/78  05/26/18 114/74    He was last seen for hypertension 3 months ago.  BP at that visit was 122/68. Management since that visit includes Lipitor 10 mg to 80mg   He reports good compliance with treatment. He is having side effects. Leg pain He is exercising. He is adherent to low salt diet.   Outside blood pressures are 120/75. He is experiencing none.  Patient denies none.   Cardiovascular risk factors include hypertension.  Use of agents associated with hypertension: none.     Weight trend: stable Wt Readings from Last 3 Encounters:  06/29/18 198 lb 9.6 oz (90.1 kg)  06/01/18 189 lb (85.7 kg)  05/26/18 195 lb (88.5 kg)    Current diet: well balanced He has had some recent symptoms with mild abdominal discomfort and mid to low back pain that is consistent with a has had kidney stones recently.  Dark-colored urine today.  No gross hematuria. ------------------------------------------------------------------------   Allergies  Allergen Reactions  . Oxycodone Nausea And Vomiting  . Vicodin [Hydrocodone-Acetaminophen] Nausea And Vomiting    Can take regular Tylenol     Current Outpatient Medications:  .  atorvastatin (LIPITOR) 80 MG tablet, Take 1 tablet (80 mg total) by mouth daily., Disp: 90 tablet, Rfl: 3 .  clopidogrel (PLAVIX) 75 MG tablet, Take 1 tablet (75 mg total) by mouth daily with breakfast., Disp: 90 tablet, Rfl: 3 .  diltiazem (CARDIZEM CD) 240 MG 24 hr capsule, Take 1 capsule (240 mg total) by mouth daily., Disp: 90 capsule, Rfl: 3 .  diltiazem  (CARDIZEM) 30 MG tablet, TAKE ONE TO TWO TABLETS EVERY 6 HOURS AS NEEDED FOR PALPITATIONS., Disp: 30 tablet, Rfl: 11 .  dofetilide (TIKOSYN) 500 MCG capsule, Take 1 capsule (500 mcg total) by mouth 2 (two) times daily., Disp: 180 capsule, Rfl: 3 .  fluticasone (FLONASE) 50 MCG/ACT nasal spray, Place into both nostrils daily., Disp: , Rfl:  .  montelukast (SINGULAIR) 10 MG tablet, Take 10 mg by mouth at bedtime., Disp: , Rfl:  .  nitroGLYCERIN (NITROSTAT) 0.4 MG SL tablet, Place 1 tablet (0.4 mg total) under the tongue every 5 (five) minutes as needed., Disp: 25 tablet, Rfl: 2 .  rivaroxaban (XARELTO) 20 MG TABS tablet, Take 1 tablet (20 mg total) by mouth daily with supper., Disp: 90 tablet, Rfl: 3 .  tamsulosin (FLOMAX) 0.4 MG CAPS capsule, Take 0.4 mg by mouth as needed (PROSTATE). , Disp: , Rfl:   Review of Systems  Constitutional: Negative.   HENT: Negative.   Eyes: Negative.   Respiratory: Negative.   Cardiovascular: Negative.   Gastrointestinal: Negative.   Endocrine: Negative.   Genitourinary: Negative.   Musculoskeletal: Negative.   Skin: Negative.   Allergic/Immunologic: Negative.   Neurological: Negative.   Hematological: Negative.   Psychiatric/Behavioral: Negative.     Social History   Tobacco Use  . Smoking status: Never Smoker  . Smokeless tobacco: Never Used  Substance Use Topics  . Alcohol use: Yes  Alcohol/week: 2.0 standard drinks    Types: 2 Cans of beer per week    Comment: occasionally      Objective:   BP (!) 144/92 (BP Location: Right Arm, Patient Position: Sitting, Cuff Size: Normal)   Pulse 77   Temp 98.3 F (36.8 C) (Oral)   Ht 5\' 10"  (1.778 m)   Wt 198 lb 9.6 oz (90.1 kg)   SpO2 97%   BMI 28.50 kg/m  Vitals:   06/29/18 0817  BP: (!) 144/92  Pulse: 77  Temp: 98.3 F (36.8 C)  TempSrc: Oral  SpO2: 97%  Weight: 198 lb 9.6 oz (90.1 kg)  Height: 5\' 10"  (1.778 m)     Physical Exam Vitals signs reviewed.  Constitutional:       Appearance: He is well-developed.  HENT:     Head: Normocephalic and atraumatic.     Right Ear: External ear normal.     Left Ear: External ear normal.     Nose: Nose normal.  Eyes:     General: No scleral icterus.    Conjunctiva/sclera: Conjunctivae normal.  Cardiovascular:     Rate and Rhythm: Normal rate and regular rhythm.     Heart sounds: Normal heart sounds.  Pulmonary:     Effort: Pulmonary effort is normal.     Breath sounds: Normal breath sounds.  Abdominal:     Palpations: Abdomen is soft.  Skin:    General: Skin is warm and dry.  Neurological:     Mental Status: He is alert and oriented to person, place, and time.  Psychiatric:        Behavior: Behavior normal.        Thought Content: Thought content normal.        Judgment: Judgment normal.    Microscopic urine reveals greater than 20 red cells and about 5 white cells per high-power field.     Assessment & Plan    1. History of kidney stones Encourage fluids.  Due to the microscopic hematuria will obtain urine and renal panel and CK. - POCT Urinalysis Dipstick - CK - Renal function panel  2. Essential hypertension Fair control.  Add losartan 50 mg daily.  Return to clinic 1 month. - losartan (COZAAR) 50 MG tablet; Take 1 tablet (50 mg total) by mouth daily.  Dispense: 90 tablet; Refill: 3  3. Atrial fibrillation with rapid ventricular response (HCC) On Xarelto.  4. Unstable angina (HCC) Presently stable.  He feels overall well  5. OSA (obstructive sleep apnea) Doing well.  Treated  6. Chronic anticoagulation - Xarelto     I have done the exam and reviewed the above chart and it is accurate to the best of my knowledge. Development worker, community has been used in this note in any air is in the dictation or transcription are unintentional.  Wilhemena Durie, MD  [place]

## 2018-06-30 LAB — RENAL FUNCTION PANEL
Albumin: 4.5 g/dL (ref 3.8–4.9)
BUN/Creatinine Ratio: 14 (ref 9–20)
BUN: 13 mg/dL (ref 6–24)
CO2: 23 mmol/L (ref 20–29)
Calcium: 8.9 mg/dL (ref 8.7–10.2)
Chloride: 105 mmol/L (ref 96–106)
Creatinine, Ser: 0.9 mg/dL (ref 0.76–1.27)
GFR calc non Af Amer: 98 mL/min/{1.73_m2} (ref >59)
GFR, EST AFRICAN AMERICAN: 113 mL/min/{1.73_m2} (ref >59)
Glucose: 101 mg/dL — ABNORMAL HIGH (ref 65–99)
Phosphorus: 2.7 mg/dL — ABNORMAL LOW (ref 2.8–4.1)
Potassium: 4 mmol/L (ref 3.5–5.2)
Sodium: 142 mmol/L (ref 134–144)

## 2018-06-30 LAB — CK: CK TOTAL: 173 U/L (ref 24–204)

## 2018-07-30 ENCOUNTER — Encounter: Payer: Self-pay | Admitting: Family Medicine

## 2018-07-30 ENCOUNTER — Other Ambulatory Visit: Payer: Self-pay

## 2018-07-30 ENCOUNTER — Ambulatory Visit: Payer: BC Managed Care – PPO | Admitting: Family Medicine

## 2018-07-30 VITALS — BP 130/86 | HR 70 | Temp 97.9°F | Ht 70.5 in | Wt 192.4 lb

## 2018-07-30 DIAGNOSIS — G4733 Obstructive sleep apnea (adult) (pediatric): Secondary | ICD-10-CM

## 2018-07-30 DIAGNOSIS — I480 Paroxysmal atrial fibrillation: Secondary | ICD-10-CM | POA: Diagnosis not present

## 2018-07-30 DIAGNOSIS — Z7901 Long term (current) use of anticoagulants: Secondary | ICD-10-CM

## 2018-07-30 DIAGNOSIS — I2510 Atherosclerotic heart disease of native coronary artery without angina pectoris: Secondary | ICD-10-CM

## 2018-07-30 DIAGNOSIS — E785 Hyperlipidemia, unspecified: Secondary | ICD-10-CM | POA: Diagnosis not present

## 2018-07-30 MED ORDER — ROSUVASTATIN CALCIUM 40 MG PO TABS: 40.0000 mg | ORAL_TABLET | Freq: Every day | ORAL | 3 refills | Status: DC

## 2018-07-30 NOTE — Progress Notes (Signed)
Patient: Jorge [name] Male    DOB: 04/04/1966   53 y.o.   MRN: [id number] Visit Date: 07/30/2018  Today's Provider: Wilhemena Durie, MD   Chief Complaint  Patient presents with  . Hypertension    fup 1 month   Subjective:     HPI   Hypertension, follow-up:  BP Readings from Last 3 Encounters:  07/30/18 130/86  06/29/18 (!) 144/92  06/01/18 112/78    He was last seen for hypertension 1 months ago.  BP at that visit was 144/92. Management since that visit includes losartan 50 mg. He reports good compliance with treatment. He is not having side effects.  He is exercising walking. He is adherent to low salt diet.   Outside blood pressures are pt not checking at this time. He is experiencing none.  Patient denies none.   Cardiovascular risk factors include hypertension.  Use of agents associated with hypertension: none.     Weight trend: stable Wt Readings from Last 3 Encounters:  07/30/18 192 lb 6.4 oz (87.3 kg)  06/29/18 198 lb 9.6 oz (90.1 kg)  06/01/18 189 lb (85.7 kg)    Current diet: well balanced  Cardiology would like LDL less than 70 if possible--last was 91 on Max Lipitor. ------------------------------------------------------------------------   Allergies  Allergen Reactions  . Oxycodone Nausea And Vomiting  . Vicodin [Hydrocodone-Acetaminophen] Nausea And Vomiting    Can take regular Tylenol     Current Outpatient Medications:  .  atorvastatin (LIPITOR) 80 MG tablet, Take 1 tablet (80 mg total) by mouth daily., Disp: 90 tablet, Rfl: 3 .  clopidogrel (PLAVIX) 75 MG tablet, Take 1 tablet (75 mg total) by mouth daily with breakfast., Disp: 90 tablet, Rfl: 3 .  diltiazem (CARDIZEM CD) 240 MG 24 hr capsule, Take 1 capsule (240 mg total) by mouth daily., Disp: 90 capsule, Rfl: 3 .  diltiazem (CARDIZEM) 30 MG tablet, TAKE ONE TO TWO TABLETS EVERY 6 HOURS AS NEEDED FOR PALPITATIONS., Disp: 30 tablet, Rfl: 11 .  dofetilide (TIKOSYN) 500  MCG capsule, Take 1 capsule (500 mcg total) by mouth 2 (two) times daily., Disp: 180 capsule, Rfl: 3 .  fluticasone (FLONASE) 50 MCG/ACT nasal spray, Place into both nostrils daily., Disp: , Rfl:  .  losartan (COZAAR) 50 MG tablet, Take 1 tablet (50 mg total) by mouth daily., Disp: 90 tablet, Rfl: 3 .  montelukast (SINGULAIR) 10 MG tablet, Take 10 mg by mouth at bedtime., Disp: , Rfl:  .  nitroGLYCERIN (NITROSTAT) 0.4 MG SL tablet, Place 1 tablet (0.4 mg total) under the tongue every 5 (five) minutes as needed., Disp: 25 tablet, Rfl: 2 .  rivaroxaban (XARELTO) 20 MG TABS tablet, Take 1 tablet (20 mg total) by mouth daily with supper., Disp: 90 tablet, Rfl: 3 .  tamsulosin (FLOMAX) 0.4 MG CAPS capsule, Take 0.4 mg by mouth as needed (PROSTATE). , Disp: , Rfl:   Review of Systems  Constitutional: Negative.   HENT: Negative.   Eyes: Negative.   Respiratory: Negative.   Cardiovascular: Negative.   Gastrointestinal: Negative.   Endocrine: Negative.   Genitourinary: Negative.   Musculoskeletal: Negative.   Skin: Negative.   Allergic/Immunologic: Negative.   Neurological: Negative.   Hematological: Negative.   Psychiatric/Behavioral: Negative.     Social History   Tobacco Use  . Smoking status: Never Smoker  . Smokeless tobacco: Never Used  Substance Use Topics  . Alcohol use: Yes    Alcohol/week: 2.0 standard  drinks    Types: 2 Cans of beer per week    Comment: occasionally      Objective:   BP 130/86 (BP Location: Right Arm, Patient Position: Sitting, Cuff Size: Normal)   Pulse 70   Temp 97.9 F (36.6 C) (Oral)   Ht 5' 10.5" (1.791 m)   Wt 192 lb 6.4 oz (87.3 kg)   SpO2 97%   BMI 27.22 kg/m  Vitals:   07/30/18 0817  BP: 130/86  Pulse: 70  Temp: 97.9 F (36.6 C)  TempSrc: Oral  SpO2: 97%  Weight: 192 lb 6.4 oz (87.3 kg)  Height: 5' 10.5" (1.791 m)     Physical Exam Vitals signs reviewed.  Constitutional:      Appearance: He is well-developed.  HENT:      Head: Normocephalic and atraumatic.     Right Ear: External ear normal.     Left Ear: External ear normal.     Nose: Nose normal.  Eyes:     General: No scleral icterus.    Conjunctiva/sclera: Conjunctivae normal.  Cardiovascular:     Rate and Rhythm: Normal rate and regular rhythm.     Heart sounds: Normal heart sounds.  Pulmonary:     Effort: Pulmonary effort is normal.     Breath sounds: Normal breath sounds.  Abdominal:     Palpations: Abdomen is soft.  Skin:    General: Skin is warm and dry.  Neurological:     Mental Status: He is alert and oriented to person, place, and time.  Psychiatric:        Behavior: Behavior normal.        Thought Content: Thought content normal.        Judgment: Judgment normal.         Assessment & Plan    1. Hyperlipidemia, unspecified hyperlipidemia type Change lipitor 80 to crestor 40mg  and rechek lipis in 1 month. - rosuvastatin (CRESTOR) 40 MG tablet; Take 1 tablet (40 mg total) by mouth daily.  Dispense: 90 tablet; Refill: 3  2. Paroxysmal atrial fibrillation (HCC)   3. OSA (obstructive sleep apnea) On CPAP  4. Chronic anticoagulation - Xarelto   5. CAD in native artery All risk factors treated.     Asael Pann Cranford Mon, MD  [facility]

## 2018-07-31 DIAGNOSIS — I2510 Atherosclerotic heart disease of native coronary artery without angina pectoris: Secondary | ICD-10-CM | POA: Insufficient documentation

## 2018-10-29 ENCOUNTER — Ambulatory Visit: Payer: BC Managed Care – PPO | Admitting: Family Medicine

## 2018-10-29 ENCOUNTER — Other Ambulatory Visit: Payer: Self-pay

## 2018-10-29 ENCOUNTER — Encounter: Payer: Self-pay | Admitting: Family Medicine

## 2018-10-29 VITALS — BP 138/82 | HR 71 | Temp 98.0°F | Resp 18 | Wt 192.0 lb

## 2018-10-29 DIAGNOSIS — G4733 Obstructive sleep apnea (adult) (pediatric): Secondary | ICD-10-CM

## 2018-10-29 DIAGNOSIS — E785 Hyperlipidemia, unspecified: Secondary | ICD-10-CM | POA: Diagnosis not present

## 2018-10-29 DIAGNOSIS — I2510 Atherosclerotic heart disease of native coronary artery without angina pectoris: Secondary | ICD-10-CM

## 2018-10-29 DIAGNOSIS — I4891 Unspecified atrial fibrillation: Secondary | ICD-10-CM

## 2018-10-29 NOTE — Progress Notes (Signed)
Patient: Jorge [name] Male    DOB: 11-28-1965   53 y.o.   MRN: [id number] Visit Date: 10/29/2018  Today's Provider: Wilhemena Durie, MD   No chief complaint on file.  Subjective:    HPI  Lipid/Cholesterol, Follow-up:   Last seen for this 3 months ago.  Management changes since that visit include medication changes. . Last Lipid Panel:    Component Value Date/Time   CHOL 150 03/31/2018 1008   TRIG 55 03/31/2018 1008   HDL 48 03/31/2018 1008   CHOLHDL 3.1 03/31/2018 1008   CHOLHDL 3.6 03/27/2017 0841   LDLCALC 91 03/31/2018 1008   LDLCALC 123 (H) 03/27/2017 0841    Risk factors for vascular disease include arteriosclerotic heart disease  He reports excellent compliance with treatment. He is not having side effects.  Current symptoms include none and have been improving. Weight trend: stable Prior visit with dietician: yes -  Current diet: in general, a "healthy" diet   Current exercise: aerobics  Wt Readings from Last 3 Encounters:  10/29/18 192 lb (87.1 kg)  07/30/18 192 lb 6.4 oz (87.3 kg)  06/29/18 198 lb 9.6 oz (90.1 kg)    Allergies  Allergen Reactions  . Oxycodone Nausea And Vomiting  . Vicodin [Hydrocodone-Acetaminophen] Nausea And Vomiting    Can take regular Tylenol     Current Outpatient Medications:  .  clopidogrel (PLAVIX) 75 MG tablet, Take 1 tablet (75 mg total) by mouth daily with breakfast., Disp: 90 tablet, Rfl: 3 .  diltiazem (CARDIZEM) 30 MG tablet, TAKE ONE TO TWO TABLETS EVERY 6 HOURS AS NEEDED FOR PALPITATIONS., Disp: 30 tablet, Rfl: 11 .  dofetilide (TIKOSYN) 500 MCG capsule, Take 1 capsule (500 mcg total) by mouth 2 (two) times daily., Disp: 180 capsule, Rfl: 3 .  fluticasone (FLONASE) 50 MCG/ACT nasal spray, Place into both nostrils daily., Disp: , Rfl:  .  losartan (COZAAR) 50 MG tablet, Take 1 tablet (50 mg total) by mouth daily., Disp: 90 tablet, Rfl: 3 .  montelukast (SINGULAIR) 10 MG tablet, Take 10 mg by mouth at  bedtime., Disp: , Rfl:  .  nitroGLYCERIN (NITROSTAT) 0.4 MG SL tablet, Place 1 tablet (0.4 mg total) under the tongue every 5 (five) minutes as needed., Disp: 25 tablet, Rfl: 2 .  rivaroxaban (XARELTO) 20 MG TABS tablet, Take 1 tablet (20 mg total) by mouth daily with supper., Disp: 90 tablet, Rfl: 3 .  rosuvastatin (CRESTOR) 40 MG tablet, Take 1 tablet (40 mg total) by mouth daily., Disp: 90 tablet, Rfl: 3 .  tamsulosin (FLOMAX) 0.4 MG CAPS capsule, Take 0.4 mg by mouth as needed (PROSTATE). , Disp: , Rfl:  .  diltiazem (CARDIZEM CD) 240 MG 24 hr capsule, Take 1 capsule (240 mg total) by mouth daily., Disp: 90 capsule, Rfl: 3  Review of Systems  All other systems reviewed and are negative.   Social History   Tobacco Use  . Smoking status: Never Smoker  . Smokeless tobacco: Never Used  Substance Use Topics  . Alcohol use: Yes    Alcohol/week: 2.0 standard drinks    Types: 2 Cans of beer per week    Comment: occasionally      Objective:   BP 138/82 (BP Location: Left Arm, Patient Position: Sitting, Cuff Size: Normal)   Pulse 71   Temp 98 F (36.7 C) (Oral)   Resp 18   Wt 192 lb (87.1 kg)   SpO2 98%   BMI  27.16 kg/m  Vitals:   10/29/18 0808  BP: 138/82  Pulse: 71  Resp: 18  Temp: 98 F (36.7 C)  TempSrc: Oral  SpO2: 98%  Weight: 192 lb (87.1 kg)     Physical Exam   No results found for any visits on 10/29/18.     Assessment & Plan    1. Hyperlipidemia, unspecified hyperlipidemia type On Crestor. - Lipid Profile  2. Atrial fibrillation with rapid ventricular response (HCC) On xarelto and Tikosyn.  3. OSA (obstructive sleep apnea)   4. CAD in native artery Risk factors treated.    I have done the exam and reviewed the above chart and it is accurate to the best of my knowledge. Development worker, community has been used in this note in any air is in the dictation or transcription are unintentional.  Wilhemena Durie, MD  [place]

## 2018-10-30 LAB — LIPID PANEL
Chol/HDL Ratio: 2.5 ratio (ref 0.0–5.0)
Cholesterol, Total: 155 mg/dL (ref 100–199)
HDL: 63 mg/dL (ref >39)
LDL Calculated: 70 mg/dL (ref 0–99)
Triglycerides: 111 mg/dL (ref 0–149)
VLDL Cholesterol Cal: 22 mg/dL (ref 5–40)

## 2019-01-28 ENCOUNTER — Telehealth: Payer: Self-pay

## 2019-02-01 NOTE — Telephone Encounter (Signed)
Spoke with pt regarding his appt on 02/05/19. Pt was advise to check his vitals prior to his appt. Pt questions were address.

## 2019-02-05 ENCOUNTER — Encounter: Payer: Self-pay | Admitting: Internal Medicine

## 2019-02-05 ENCOUNTER — Telehealth (INDEPENDENT_AMBULATORY_CARE_PROVIDER_SITE_OTHER): Payer: BC Managed Care – PPO | Admitting: Internal Medicine

## 2019-02-05 VITALS — BP 133/93 | HR 93 | Ht 70.5 in | Wt 192.0 lb

## 2019-02-05 DIAGNOSIS — I10 Essential (primary) hypertension: Secondary | ICD-10-CM | POA: Diagnosis not present

## 2019-02-05 DIAGNOSIS — I480 Paroxysmal atrial fibrillation: Secondary | ICD-10-CM | POA: Diagnosis not present

## 2019-02-05 DIAGNOSIS — I2510 Atherosclerotic heart disease of native coronary artery without angina pectoris: Secondary | ICD-10-CM | POA: Diagnosis not present

## 2019-02-05 NOTE — Progress Notes (Signed)
Electrophysiology TeleHealth Note   Due to national recommendations of social distancing due to COVID 19, an audio/video telehealth visit is felt to be most appropriate for this patient at this time.  See MyChart message from today for the patient's consent to telehealth for [facility].   Date:  02/05/2019   ID:  Jorge [name], DOB [birth year], MRN [id number]  Location: patient's home  Provider location:  [facility]  Evaluation Performed: Follow-up visit  PCP:  Jerrol Banana., MD   Electrophysiologist:  Dr Rayann Heman  Chief Complaint:  palpitations  History of Present Illness:    Jorge [name] is a 53 y.o. male who presents via telehealth conferencing today.  Since last being seen in our clinic, the patient reports doing very well.  Today, he denies symptoms of palpitations, chest pain, shortness of breath,  lower extremity edema, dizziness, presyncope, or syncope.  The patient is otherwise without complaint today.  The patient denies symptoms of fevers, chills, cough, or new SOB worrisome for COVID 19.  Past Medical History:  Diagnosis Date  . Atrial fibrillation ([place])    first diagnosed 2011; s/p DCCV at that time; ablation 10-2013 & 03/2014; failed multaq, flecainide and amiodarone  . Chronic anticoagulation    Xarelto  . Chronic kidney disease    H/O KIDNEY STONES  . Dysrhythmia   . Headache   . Hyperlipidemia   . Hypertension   . Sinusitis     Past Surgical History:  Procedure Laterality Date  . APPENDECTOMY  1975  . ATRIAL FIBRILLATION ABLATION N/A 11/02/2013   PVI by Dr Rayann Heman  . ATRIAL FIBRILLATION ABLATION N/A 03/29/2014   PVI and CTI by Dr Rayann Heman  . ATRIAL FIBRILLATION ABLATION N/A 07/04/2016   Procedure: Atrial Fibrillation Ablation;  Surgeon: Thompson Grayer, MD;  Location: [place] CV LAB;  Service: Cardiovascular;  Laterality: N/A;  . CARDIOVERSION     Hx of  . CARDIOVERSION N/A 10/15/2013   Procedure: CARDIOVERSION;  Surgeon: Fay Records, MD;  Location: [place];  Service: Cardiovascular;  Laterality: N/A;  . COLONOSCOPY  2016  . CORONARY STENT INTERVENTION N/A 12/23/2017   Procedure: CORONARY STENT INTERVENTION;  Surgeon: Burnell Blanks, MD;  Location: [place] CV LAB;  Service: Cardiovascular;  Laterality: N/A;  . HERNIA REPAIR Left 05/28/2013   left inguinal henria repair, robotic direct defect repaired with Bard ultralight mesh.  Marland Kitchen HERNIA REPAIR Left 05/31/2015   Large Atrium pro loop mesh, direct defect.   . INGUINAL HERNIA REPAIR Left 05/31/2015   Procedure: HERNIA REPAIR INGUINAL ADULT;  Surgeon: Robert Bellow, MD;  Location: [facility] ORS;  Service: General;  Laterality: Left;  . KNEE SURGERY  2009   [facility]  . LITHOTRIPSY     x 3  last one  2013  . RHINOPLASTY  2006   Dr. Nadeen Landau, [facility]  . RIGHT/LEFT HEART CATH AND CORONARY ANGIOGRAPHY N/A 12/23/2017   Procedure: RIGHT/LEFT HEART CATH AND CORONARY ANGIOGRAPHY;  Surgeon: Burnell Blanks, MD;  Location: [place] CV LAB;  Service: Cardiovascular;  Laterality: N/A;  . TEE WITHOUT CARDIOVERSION N/A 11/01/2013   Procedure: TRANSESOPHAGEAL ECHOCARDIOGRAM (TEE);  Surgeon: Candee Furbish, MD;  Location: [facility] ENDOSCOPY;  Service: Cardiovascular;  Laterality: N/A;  . TEE WITHOUT CARDIOVERSION N/A 03/28/2014   Procedure: TRANSESOPHAGEAL ECHOCARDIOGRAM (TEE);  Surgeon: Larey Dresser, MD;  Location: [facility] ENDOSCOPY;  Service: Cardiovascular;  Laterality: N/A;    Current Outpatient Medications  Medication Sig Dispense Refill  . clopidogrel (PLAVIX) 75 MG tablet Take 1 tablet (75 mg total) by mouth daily with breakfast. 90 tablet 3  . diltiazem (CARDIZEM) 30 MG tablet TAKE ONE TO TWO TABLETS EVERY 6 HOURS AS NEEDED FOR PALPITATIONS. 30 tablet 11  . dofetilide (TIKOSYN) 500 MCG capsule Take 1 capsule (500 mcg total) by mouth 2 (two) times daily. 180 capsule 3  . fluticasone (FLONASE) 50 MCG/ACT nasal spray Place into both nostrils  daily.    Marland Kitchen losartan (COZAAR) 50 MG tablet Take 1 tablet (50 mg total) by mouth daily. 90 tablet 3  . montelukast (SINGULAIR) 10 MG tablet Take 10 mg by mouth at bedtime.    . nitroGLYCERIN (NITROSTAT) 0.4 MG SL tablet Place 1 tablet (0.4 mg total) under the tongue every 5 (five) minutes as needed. 25 tablet 2  . rivaroxaban (XARELTO) 20 MG TABS tablet Take 1 tablet (20 mg total) by mouth daily with supper. 90 tablet 3  . rosuvastatin (CRESTOR) 40 MG tablet Take 1 tablet (40 mg total) by mouth daily. 90 tablet 3  . tamsulosin (FLOMAX) 0.4 MG CAPS capsule Take 0.4 mg by mouth as needed (PROSTATE).     Marland Kitchen diltiazem (CARDIZEM CD) 240 MG 24 hr capsule Take 1 capsule (240 mg total) by mouth daily. 90 capsule 3   No current facility-administered medications for this visit.     Allergies:   Oxycodone and Vicodin [hydrocodone-acetaminophen]   Social History:  The patient  reports that he has never smoked. He has never used smokeless tobacco. He reports current alcohol use of about 2.0 standard drinks of alcohol per week. He reports that he does not use drugs.   Family History:  The patient's family history includes Atrial fibrillation in his maternal grandmother; CVA in his paternal grandmother; Cancer in his mother; Colon polyps in an other family member; Heart attack (age of onset: 55) in his maternal grandfather; Heart failure in his brother; Hypertension in his maternal grandfather.   ROS:  Please see the history of present illness.   All other systems are personally reviewed and negative.    Exam:    Vital Signs:  BP (!) 133/93   Pulse 93   Ht 5' 10.5" (1.791 m)   Wt 192 lb (87.1 kg)   BMI 27.16 kg/m   Well sounding and appearing, alert and conversant, regular work of breathing,  good skin color Eyes- anicteric, neuro- grossly intact, skin- no apparent rash or lesions or cyanosis, mouth- oral mucosa is pink  Labs/Other Tests and Data Reviewed:    Recent Labs: 03/31/2018: ALT 35;  Hemoglobin 12.9; Platelets 263; TSH 0.946 05/15/2018: Magnesium 2.2 06/29/2018: BUN 13; Creatinine, Ser 0.90; Potassium 4.0; Sodium 142   Wt Readings from Last 3 Encounters:  02/05/19 192 lb (87.1 kg)  10/29/18 192 lb (87.1 kg)  07/30/18 192 lb 6.4 oz (87.3 kg)    Lipids 10/29/2018 reviewed  ASSESSMENT & PLAN:    1.  Paroxysmal atrial fibrillation Doing well with tikosyn Needs bmet, mg and ekg on follow-up with Dr Angelena Form later this month  2. CAD Aggressive risk factor modification Lipids reviewed Consider stopping antiplatelet therapy and continuing xarelto on follow-up with Dr Angelena Form  3. HTN Stable No change required today  4. OSA Uses CPAP  Follow-up:  Return to see me in 6 months   Patient Risk:  after full review of this patients clinical status, I feel that they are at moderate risk at this time.  Today, I have spent 15 minutes with the patient with telehealth technology discussing arrhythmia management .    Army Fossa, MD  02/05/2019 9:43 AM     [facility] [address] [place] [address] [phone] (office) [phone] (fax)

## 2019-02-17 NOTE — Progress Notes (Signed)
Chief Complaint  Patient presents with   Follow-up    CAD   History of Present Illness: 53 yo male with history of atrial fibrillation, HTN, hyperlipidemia and CAD here today for cardiac follow up. His atrial fibrillation is followed by Dr. Rayann Heman. He began having chest pain and dyspnea in the summer of 2019. Nuclear stress test was low risk. He continue to have symptoms and Dr. Rayann Heman referred him for a cardiac cath on 12/23/17. Cardiac cath with severe stenosis in the mid LAD treated with a drug eluting stent. There was mild disease in the RCA and moderate non-obstructive disease in the Circumflex. He has been on Plavix and Xarelto.  He is a retried Emergency planning/management officer.    He is here today for follow up. The patient denies any chest pain, palpitations, lower extremity edema, orthopnea, PND, dizziness, near syncope or syncope. Mild dyspnea with exertion. He feels his heart is out of rhythm when he feels the dyspnea as well.   Primary Care Physician: Jerrol Banana., MD  Past Medical History:  Diagnosis Date   Atrial fibrillation [facility])    first diagnosed 2011; s/p DCCV at that time; ablation 10-2013 & 03/2014; failed multaq, flecainide and amiodarone   Chronic anticoagulation    Xarelto   Chronic kidney disease    H/O KIDNEY STONES   Dysrhythmia    Headache    Hyperlipidemia    Hypertension    Sinusitis     Past Surgical History:  Procedure Laterality Date   APPENDECTOMY  1975   ATRIAL FIBRILLATION ABLATION N/A 11/02/2013   PVI by Dr Rayann Heman   ATRIAL FIBRILLATION ABLATION N/A 03/29/2014   PVI and CTI by Dr Rayann Heman   ATRIAL FIBRILLATION ABLATION N/A 07/04/2016   Procedure: Atrial Fibrillation Ablation;  Surgeon: Thompson Grayer, MD;  Location: [place] CV LAB;  Service: Cardiovascular;  Laterality: N/A;   CARDIOVERSION     Hx of   CARDIOVERSION N/A 10/15/2013   Procedure: CARDIOVERSION;  Surgeon: Fay Records, MD;  Location: [facility] ENDOSCOPY;  Service: Cardiovascular;   Laterality: N/A;   COLONOSCOPY  2016   CORONARY STENT INTERVENTION N/A 12/23/2017   Procedure: CORONARY STENT INTERVENTION;  Surgeon: Burnell Blanks, MD;  Location: [place] CV LAB;  Service: Cardiovascular;  Laterality: N/A;   HERNIA REPAIR Left 05/28/2013   left inguinal henria repair, robotic direct defect repaired with Bard ultralight mesh.   HERNIA REPAIR Left 05/31/2015   Large Atrium pro loop mesh, direct defect.    INGUINAL HERNIA REPAIR Left 05/31/2015   Procedure: HERNIA REPAIR INGUINAL ADULT;  Surgeon: Robert Bellow, MD;  Location: [facility] ORS;  Service: General;  Laterality: Left;   KNEE SURGERY  2009   [facility]   LITHOTRIPSY     x 3  last one  2013   RHINOPLASTY  2006   Dr[facility]adeen Landau, [facility]   RIGHT/LEFT HEART CATH AND CORONARY ANGIOGRAPHY N/A 12/23/2017   Procedure: RIGHT/LEFT HEART CATH AND CORONARY ANGIOGRAPHY;  Surgeon: Burnell Blanks, MD;  Location: [place] CV LAB;  Service: Cardiovascular;  Laterality: N/A;   TEE WITHOUT CARDIOVERSION N/A 11/01/2013   Procedure: TRANSESOPHAGEAL ECHOCARDIOGRAM (TEE);  Surgeon: Candee Furbish, MD;  Location: [facility] ENDOSCOPY;  Service: Cardiovascular;  Laterality: N/A;   TEE WITHOUT CARDIOVERSION N/A 03/28/2014   Procedure: TRANSESOPHAGEAL ECHOCARDIOGRAM (TEE);  Surgeon: Larey Dresser, MD;  Location: [facility] ENDOSCOPY;  Service: Cardiovascular;  Laterality: N/A;    Current Outpatient Medications  Medication Sig  Dispense Refill   clopidogrel (PLAVIX) 75 MG tablet Take 1 tablet (75 mg total) by mouth daily with breakfast. 90 tablet 3   diltiazem (CARDIZEM CD) 240 MG 24 hr capsule Take 1 capsule (240 mg total) by mouth daily. 90 capsule 3   diltiazem (CARDIZEM) 30 MG tablet TAKE ONE TO TWO TABLETS EVERY 6 HOURS AS NEEDED FOR PALPITATIONS. 30 tablet 11   dofetilide (TIKOSYN) 500 MCG capsule Take 1 capsule (500 mcg total) by mouth 2 (two) times daily. 180 capsule 3   fluticasone (FLONASE) 50  MCG/ACT nasal spray Place into both nostrils daily.     losartan (COZAAR) 50 MG tablet Take 1 tablet (50 mg total) by mouth daily. 90 tablet 3   montelukast (SINGULAIR) 10 MG tablet Take 10 mg by mouth at bedtime.     nitroGLYCERIN (NITROSTAT) 0.4 MG SL tablet Place 1 tablet (0.4 mg total) under the tongue every 5 (five) minutes as needed. 25 tablet 2   rivaroxaban (XARELTO) 20 MG TABS tablet Take 1 tablet (20 mg total) by mouth daily with supper. 90 tablet 3   rosuvastatin (CRESTOR) 40 MG tablet Take 1 tablet (40 mg total) by mouth daily. 90 tablet 3   tamsulosin (FLOMAX) 0.4 MG CAPS capsule Take 0.4 mg by mouth as needed (PROSTATE).      No current facility-administered medications for this visit.     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[employer]  Social Needs   Financial resource strain: Not on file   Food insecurity    Worry: Not on file    Inability: Not on file   Transportation needs    Medical: Not on file    Non-medical: Not on file  Tobacco Use   Smoking status: Never Smoker   Smokeless tobacco: Never Used  Substance and Sexual Activity   Alcohol use: Yes    Alcohol/week: 2.0 standard drinks    Types: 2 Cans of beer per week    Comment: occasionally   Drug use: No   Sexual activity: Yes  Lifestyle   Physical activity    Days per week: Not on file    Minutes per session: Not on file   Stress: Not on file  Relationships   Social connections    Talks on phone: Not on file    Gets together: Not on file    Attends religious service: Not on file    Active member of  club or organization: Not on file    Attends meetings of clubs or organizations: Not on file    Relationship status: Not on file   Intimate partner violence    Fear of current or ex partner: Not on file    Emotionally abused: Not on file    Physically abused: Not on file    Forced sexual activity: Not on file  Other Topics Concern   Not on file  Social History Narrative   Lives in [place] with his spouse.  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[employer]    Family History  Problem Relation  Age of Onset   Cancer Mother        breast, cause of death   Heart attack Maternal Grandfather 12       Cause of death   Hypertension Maternal Grandfather    Heart failure Brother    CVA Paternal Grandmother        cause of death   Atrial fibrillation Maternal Grandmother    Colon polyps Other        Fam Hx   Kidney cancer Neg Hx    Bladder Cancer Neg Hx    Prostate cancer Neg Hx     Review of Systems:  As stated in the HPI and otherwise negative.   BP 128/82    Pulse 75    Ht 5' 10.5" (1.791 m)    Wt 196 lb (88.9 kg)    SpO2 98%    BMI 27.73 kg/m   Physical Examination: General: Well developed, well nourished, NAD  HEENT: OP clear, mucus membranes moist  SKIN: warm, dry. No rashes. Neuro: No focal deficits  Musculoskeletal: Muscle strength 5/5 all ext  Psychiatric: Mood and affect normal  Neck: No JVD, no carotid bruits, no thyromegaly, no lymphadenopathy.  Lungs:Clear bilaterally, no wheezes, rhonci, crackles Cardiovascular: Regular rate and rhythm. No murmurs, gallops or rubs. Abdomen:Soft. Bowel sounds present. Non-tender.  Extremities: No lower extremity edema. Pulses are 2 + in the bilateral DP/PT.  EKG:  EKG is ordered today. The ekg ordered today demonstrates NSR, rate 75 bpm.   Recent Labs: 03/31/2018: ALT 35; Hemoglobin 12.9; Platelets 263; TSH 0.946 05/15/2018: Magnesium 2.2 06/29/2018: BUN 13; Creatinine, Ser 0.90; Potassium 4.0; Sodium 142    Lipid Panel    Component Value Date/Time   CHOL 155 10/29/2018 0841   TRIG 111 10/29/2018 0841   HDL 63 10/29/2018 0841   CHOLHDL 2.5 10/29/2018 0841   CHOLHDL 3.6 03/27/2017 0841   LDLCALC 70 10/29/2018 0841   LDLCALC 123 (H) 03/27/2017 0841     Wt Readings from Last 3 Encounters:  02/18/19 196 lb (88.9 kg)  02/05/19 192 lb (87.1 kg)  10/29/18 192 lb (87.1 kg)     Other studies Reviewed: Additional studies/ records that were reviewed today include: . Review of the above records demonstrates:    Assessment and Plan:   1. CAD without angina: He has no chest pain. Will continue Plavix and statin.  LDL is at goal. Will stop Plavix and start ASA 81 mg daily. LDL is now at goal on Crestor. He is curious about the moderate stenosis in the Circumflex. He has some dyspnea. Will arrange exercise stress test in January 2020  2. Atrial fibrillation, paroxysmal: He is in sinus rhythm today. Continue Xarelto, Tikosyn and Cardizem. This is followed by Dr Rayann Heman. Per Dr. Bonita Quin note last week, he needs BMET and Mg level today.   3. HTN: BP is well controlled. No changes today.   4. Aortic insufficiency: Mild by echo in 2016. Will repeat echo in January 2020  Current medicines are reviewed at length with the patient today.  The patient does not have concerns regarding medicines.  The following changes have been made:  no change  Labs/ tests ordered today include:   Orders Placed This Encounter  Procedures   Basic metabolic panel   Magnesium   EXERCISE TOLERANCE TEST (ETT)   EKG 12-Lead   ECHOCARDIOGRAM COMPLETE     Disposition:   FU with me in 12 months   Signed, Lauree Chandler, MD 02/18/2019  9:33 AM    [facility] [address], [place], [place]  [address] Phone: [phone]; Fax: [phone]

## 2019-02-18 ENCOUNTER — Encounter: Payer: Self-pay | Admitting: Cardiovascular Disease

## 2019-02-18 ENCOUNTER — Other Ambulatory Visit: Payer: Self-pay

## 2019-02-18 ENCOUNTER — Ambulatory Visit: Payer: BC Managed Care – PPO | Admitting: Cardiovascular Disease

## 2019-02-18 VITALS — BP 128/82 | HR 75 | Ht 70.5 in | Wt 196.0 lb

## 2019-02-18 DIAGNOSIS — I2510 Atherosclerotic heart disease of native coronary artery without angina pectoris: Secondary | ICD-10-CM

## 2019-02-18 DIAGNOSIS — I480 Paroxysmal atrial fibrillation: Secondary | ICD-10-CM | POA: Diagnosis not present

## 2019-02-18 DIAGNOSIS — I351 Nonrheumatic aortic (valve) insufficiency: Secondary | ICD-10-CM | POA: Diagnosis not present

## 2019-02-18 DIAGNOSIS — I10 Essential (primary) hypertension: Secondary | ICD-10-CM

## 2019-02-18 LAB — BASIC METABOLIC PANEL
BUN/Creatinine Ratio: 20 (ref 9–20)
BUN: 18 mg/dL (ref 6–24)
CO2: 22 mmol/L (ref 20–29)
Calcium: 9.6 mg/dL (ref 8.7–10.2)
Chloride: 103 mmol/L (ref 96–106)
Creatinine, Ser: 0.89 mg/dL (ref 0.76–1.27)
GFR calc Af Amer: 113 mL/min/{1.73_m2} (ref >59)
GFR calc non Af Amer: 98 mL/min/{1.73_m2} (ref >59)
Glucose: 94 mg/dL (ref 65–99)
Potassium: 3.8 mmol/L (ref 3.5–5.2)
Sodium: 138 mmol/L (ref 134–144)

## 2019-02-18 LAB — MAGNESIUM: Magnesium: 1.9 mg/dL (ref 1.6–2.3)

## 2019-02-18 NOTE — Patient Instructions (Signed)
Medication Instructions:  No changes today *If you need a refill on your cardiac medications before your next appointment, please call your pharmacy*  Lab Work: BMET, Magnesium If you have labs (blood work) drawn today and your tests are completely normal, you will receive your results only by: Marland Kitchen MyChart Message (if you have MyChart) OR . A paper copy in the mail If you have any lab test that is abnormal or we need to change your treatment, we will call you to review the results.  Testing/Procedures: Your physician has requested that you have an echocardiogram. Echocardiography is a painless test that uses sound waves to create images of your heart. It provides your doctor with information about the size and shape of your heart and how well your heart's chambers and valves are working. This procedure takes approximately one hour. There are no restrictions for this procedure.  PLEASE SCHEDULE THIS FOR [facility]  Your physician has requested that you have an exercise tolerance test. For further information please visit [email or web address]. Please also follow instruction sheet, as given.  PLEASE SCHEDULE THIS FOR [facility] You will need to have a Covid19 screening prior to this test.  It will be a drive up test at [id number], [place].    Follow-Up: At [facility], you and your health needs are our priority.  As part of our continuing mission to provide you with exceptional heart care, we have created designated Provider Care Teams.  These Care Teams include your primary Cardiologist (physician) and Advanced Practice Providers (APPs -  Physician Assistants and Nurse Practitioners) who all work together to provide you with the care you need, when you need it.  Your next appointment:   12 months  The format for your next appointment:   In Person  Provider:   Lauree Chandler, MD  Other Instructions

## 2019-04-02 NOTE — Progress Notes (Signed)
Patient: Jorge [name], Male    DOB: 13-Jan-1966, 53 y.o.   MRN: [id number] Visit Date: 04/06/2019  Today's Provider: Wilhemena Durie, MD   Chief Complaint  Patient presents with  . Annual Exam   Subjective:     Annual physical exam Jorge [name] is a 53 y.o. male who presents today for health maintenance and complete physical. He feels well. He reports exercising soing yard work. He reports he is sleeping well. He feels well. He is retired but consults for [employer]. -----------------------------------------------------------------   Review of Systems  Constitutional: Negative.   HENT: Negative.   Eyes: Negative.   Respiratory: Negative.   Cardiovascular: Positive for palpitations.  Gastrointestinal: Negative.   Endocrine: Negative.   Genitourinary: Negative.   Musculoskeletal: Positive for arthralgias, back pain and neck pain.  Skin: Negative.   Allergic/Immunologic: Negative.   Neurological: Negative.   Hematological: Negative.   Psychiatric/Behavioral: Negative.     Social History      He  reports that he has never smoked. He has never used smokeless tobacco. He reports current alcohol use of about 2.0 standard drinks of alcohol per week. He reports that he does not use drugs.       Social History   Socioeconomic History  . Marital status: Married    Spouse name: Not on file  . Number of children: Not on file  . Years of education: Not on file  . Highest education level: Not on file  Occupational History  . Occupation: Passenger transport manager w/ [employer]  Social Needs  . Financial resource strain: Not on file  . Food insecurity    Worry: Not on file    Inability: Not on file  . Transportation needs    Medical: Not on file    Non-medical: Not on file  Tobacco Use  . Smoking status: Never Smoker  . Smokeless tobacco: Never Used  Substance and Sexual Activity  . Alcohol use: Yes    Alcohol/week: 2.0 standard drinks    Types:  2 Cans of beer per week    Comment: occasionally  . Drug use: No  . Sexual activity: Yes  Lifestyle  . Physical activity    Days per week: Not on file    Minutes per session: Not on file  . Stress: Not on file  Relationships  . Social Herbalist on phone: Not on file    Gets together: Not on file    Attends religious service: Not on file    Active member of club or organization: Not on file    Attends meetings of clubs or organizations: Not on file    Relationship status: Not on file  Other Topics Concern  . Not on file  Social History Narrative   Lives in [place] with his spouse.  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[employer]    Past Medical History:  Diagnosis Date  . Atrial fibrillation ([place])    first diagnosed 2011; s/p DCCV at that time; ablation 10-2013 & 03/2014; failed multaq, flecainide and amiodarone  . Chronic anticoagulation    Xarelto  . Chronic kidney disease    H/O KIDNEY STONES  . Dysrhythmia   . Headache   . Hyperlipidemia   . Hypertension   . Sinusitis      Patient Active Problem List   Diagnosis Date Noted  . CAD in native artery 07/31/2018  . OSA (  obstructive sleep apnea) 04/06/2018  . Unstable angina ([place])   . A-fib ([place]) 07/04/2016  . Allergic rhinitis 03/08/2015  . HLD (hyperlipidemia) 03/08/2015  . Atrial fibrillation with rapid ventricular response ([place]) 07/04/2014  . Chronic anticoagulation - Xarelto   . Paroxysmal atrial fibrillation ([place]) 03/29/2014  . Essential hypertension 12/15/2013  . Atrial fibrillation ([place]) 06/13/2013    Past Surgical History:  Procedure Laterality Date  . APPENDECTOMY  1975  . ATRIAL FIBRILLATION ABLATION N/A 11/02/2013   PVI by Dr Rayann Heman  . ATRIAL FIBRILLATION ABLATION N/A 03/29/2014   PVI and CTI by Dr Rayann Heman  . ATRIAL FIBRILLATION ABLATION N/A 07/04/2016   Procedure: Atrial Fibrillation Ablation;  Surgeon: Thompson Grayer, MD;  Location: [place] [facility];  Service: Cardiovascular;   Laterality: N/A;  . CARDIOVERSION     Hx of  . CARDIOVERSION N/A 10/15/2013   Procedure: CARDIOVERSION;  Surgeon: Fay Records, MD;  Location: [place];  Service: Cardiovascular;  Laterality: N/A;  . COLONOSCOPY  2016  . CORONARY STENT INTERVENTION N/A 12/23/2017   Procedure: CORONARY STENT INTERVENTION;  Surgeon: Burnell Blanks, MD;  Location: [place] [facility];  Service: Cardiovascular;  Laterality: N/A;  . HERNIA REPAIR Left 05/28/2013   left inguinal henria repair, robotic direct defect repaired with Bard ultralight mesh.  Marland Kitchen HERNIA REPAIR Left 05/31/2015   Large Atrium pro loop mesh, direct defect.   . INGUINAL HERNIA REPAIR Left 05/31/2015   Procedure: HERNIA REPAIR INGUINAL ADULT;  Surgeon: Robert Bellow, MD;  Location: [facility];  Service: General;  Laterality: Left;  . KNEE SURGERY  2009   [facility]  . LITHOTRIPSY     x 3  last one  2013  . RHINOPLASTY  2006   Dr[facility] Nadeen Landau, [facility]  . RIGHT/LEFT HEART CATH AND CORONARY ANGIOGRAPHY N/A 12/23/2017   Procedure: RIGHT/LEFT HEART CATH AND CORONARY ANGIOGRAPHY;  Surgeon: Burnell Blanks, MD;  Location: [place] [facility];  Service: Cardiovascular;  Laterality: N/A;  . TEE WITHOUT CARDIOVERSION N/A 11/01/2013   Procedure: TRANSESOPHAGEAL ECHOCARDIOGRAM (TEE);  Surgeon: Candee Furbish, MD;  Location: [facility];  Service: Cardiovascular;  Laterality: N/A;  . TEE WITHOUT CARDIOVERSION N/A 03/28/2014   Procedure: TRANSESOPHAGEAL ECHOCARDIOGRAM (TEE);  Surgeon: Larey Dresser, MD;  Location: [facility] ENDOS[facility];  Service: Cardiovascular;  Laterality: N/A;    Family History        Family Status  Relation Name Status  . Mother  Deceased at age 62  . MGF  Deceased at age 50       from MI  . Brother  Alive       stable health, mental health issues  . PGM  Deceased       From CVA  . Father  Deceased at age 55       suicide  . MGM  Deceased  . Other  (Not Specified)  . PGF  Deceased  . Neg Hx  (Not  Specified)        His family history includes Atrial fibrillation in his maternal grandmother; CVA in his paternal grandmother; Cancer in his mother; Colon polyps in an other family member; Heart attack (age of onset: 89) in his maternal grandfather; Heart failure in his brother; Hypertension in his maternal grandfather. There is no history of Kidney cancer, Bladder Cancer, or Prostate cancer.      Allergies  Allergen Reactions  . Oxycodone Nausea And Vomiting  . Vicodin [Hydrocodone-Acetaminophen] Nausea And Vomiting  Can take regular Tylenol     Current Outpatient Medications:  .  clopidogrel (PLAVIX) 75 MG tablet, Take 1 tablet (75 mg total) by mouth daily with breakfast., Disp: 90 tablet, Rfl: 3 .  diltiazem (CARDIZEM CD) 240 MG 24 hr capsule, Take 1 capsule (240 mg total) by mouth daily., Disp: 90 capsule, Rfl: 3 .  diltiazem (CARDIZEM) 30 MG tablet, TAKE ONE TO TWO TABLETS EVERY 6 HOURS AS NEEDED FOR PALPITATIONS., Disp: 30 tablet, Rfl: 11 .  dofetilide (TIKOSYN) 500 MCG capsule, Take 1 capsule (500 mcg total) by mouth 2 (two) times daily., Disp: 180 capsule, Rfl: 3 .  fluticasone (FLONASE) 50 MCG/ACT nasal spray, Place into both nostrils daily., Disp: , Rfl:  .  losartan (COZAAR) 50 MG tablet, Take 1 tablet (50 mg total) by mouth daily., Disp: 90 tablet, Rfl: 3 .  montelukast (SINGULAIR) 10 MG tablet, Take 10 mg by mouth at bedtime., Disp: , Rfl:  .  nitroGLYCERIN (NITROSTAT) 0.4 MG SL tablet, Place 1 tablet (0.4 mg total) under the tongue every 5 (five) minutes as needed., Disp: 25 tablet, Rfl: 2 .  rivaroxaban (XARELTO) 20 MG TABS tablet, Take 1 tablet (20 mg total) by mouth daily with supper., Disp: 90 tablet, Rfl: 3 .  rosuvastatin (CRESTOR) 40 MG tablet, Take 1 tablet (40 mg total) by mouth daily., Disp: 90 tablet, Rfl: 3 .  tamsulosin (FLOMAX) 0.4 MG CAPS capsule, Take 0.4 mg by mouth as needed (PROSTATE). , Disp: , Rfl:    Patient Care Team: Jerrol Banana., MD as  PCP - General (Family Medicine) Thompson Grayer, MD as PCP - Electrophysiology (Cardiology) Burnell Blanks, MD as PCP - Cardiology (Cardiology) Jerrol Banana., MD (Family Medicine) Bary Castilla Forest Gleason, MD (General Surgery)    Objective:    Vitals: BP 121/80   Pulse 80   Temp (!) 96.6 F (35.9 C) (Temporal)   Resp 16   Ht 5' 10"  (1.778 m)   Wt 196 lb 3.2 oz (89 kg)   BMI 28.15 kg/m    Vitals:   04/06/19 0941  BP: 121/80  Pulse: 80  Resp: 16  Temp: (!) 96.6 F (35.9 C)  TempSrc: Temporal  Weight: 196 lb 3.2 oz (89 kg)  Height: 5' 10"  (1.778 m)     Physical Exam Vitals reviewed.  Constitutional:      Appearance: He is well-developed.  HENT:     Head: Normocephalic and atraumatic.     Right Ear: External ear normal.     Left Ear: External ear normal.     Nose: Nose normal.  Eyes:     General: No scleral icterus.    Conjunctiva/sclera: Conjunctivae normal.  Cardiovascular:     Rate and Rhythm: Normal rate and regular rhythm.     Heart sounds: Normal heart sounds.  Pulmonary:     Effort: Pulmonary effort is normal.     Breath sounds: Normal breath sounds.  Abdominal:     Palpations: Abdomen is soft.  Skin:    General: Skin is warm and dry.  Neurological:     Mental Status: He is alert and oriented to person, place, and time.  Psychiatric:        Behavior: Behavior normal.        Thought Content: Thought content normal.        Judgment: Judgment normal.      Depression Screen PHQ 2/9 Scores 04/06/2019 07/30/2018 06/29/2018 03/31/2018  PHQ - 2 Score 0 0  0 0  PHQ- 9 Score 0 - - -       Assessment & Plan:     Routine Health Maintenance and Physical Exam  Exercise Activities and Dietary recommendations Goals   None     Immunization History  Administered Date(s) Administered  . Influenza,inj,Quad PF,6+ Mos 03/13/2016, 03/26/2017, 04/07/2018, 04/06/2019  . Tdap 02/09/2013, 04/22/2018    Health Maintenance  Topic Date Due  . HIV  Screening  08/13/1980  . COLONOSCOPY  01/18/2025  . TETANUS/TDAP  04/22/2028  . INFLUENZA VACCINE  Completed     Discussed health benefits of physical activity, and encouraged him to engage in regular exercise appropriate for his age and condition.    -------------------------------------------------------------------- 1. Need for influenza vaccination  - CBC with Diff - Comp Met (CMET) - Lipid panel - TSH - PSA - POCT Urinalysis Dipstick  2. Annual physical exam Overall good health.   - CBC with Diff - Comp Met (CMET) - Lipid panel - TSH - PSA - POCT Urinalysis Dipstick Under Microscope rare WBC and no RBC were viewed.  3. Hyperlipidemia, unspecified hyperlipidemia type  - CBC with Diff - Comp Met (CMET) - Lipid panel - TSH - PSA - POCT Urinalysis Dipstick  4. Atrial fibrillation with rapid ventricular response [facility]) Per cardiology. - CBC with Diff - Comp Met (CMET) - Lipid panel - TSH - PSA - POCT Urinalysis Dipstick  5. Essential hypertension  - CBC with Diff - Comp Met (CMET) - Lipid panel - TSH - PSA - POCT Urinalysis Dipstick  6. Prostate cancer screening  - CBC with Diff - Comp Met (CMET) - Lipid panel - TSH - PSA - POCT Urinalysis Dipstick    Wilhemena Durie, MD  [facility]

## 2019-04-06 ENCOUNTER — Other Ambulatory Visit: Payer: Self-pay

## 2019-04-06 ENCOUNTER — Ambulatory Visit (INDEPENDENT_AMBULATORY_CARE_PROVIDER_SITE_OTHER): Payer: BC Managed Care – PPO | Admitting: Family Medicine

## 2019-04-06 ENCOUNTER — Encounter: Payer: Self-pay | Admitting: Family Medicine

## 2019-04-06 VITALS — BP 121/80 | HR 80 | Temp 96.6°F | Resp 16 | Ht 70.0 in | Wt 196.2 lb

## 2019-04-06 DIAGNOSIS — Z0000 Encounter for general adult medical examination without abnormal findings: Secondary | ICD-10-CM | POA: Diagnosis not present

## 2019-04-06 DIAGNOSIS — Z23 Encounter for immunization: Secondary | ICD-10-CM

## 2019-04-06 DIAGNOSIS — E785 Hyperlipidemia, unspecified: Secondary | ICD-10-CM

## 2019-04-06 DIAGNOSIS — I2510 Atherosclerotic heart disease of native coronary artery without angina pectoris: Secondary | ICD-10-CM

## 2019-04-06 DIAGNOSIS — Z125 Encounter for screening for malignant neoplasm of prostate: Secondary | ICD-10-CM

## 2019-04-06 DIAGNOSIS — I4891 Unspecified atrial fibrillation: Secondary | ICD-10-CM | POA: Diagnosis not present

## 2019-04-06 DIAGNOSIS — I10 Essential (primary) hypertension: Secondary | ICD-10-CM

## 2019-04-06 LAB — POCT URINALYSIS DIPSTICK
Bilirubin, UA: NEGATIVE
Glucose, UA: NEGATIVE
Ketones, UA: NEGATIVE
Leukocytes, UA: NEGATIVE
Nitrite, UA: NEGATIVE
Protein, UA: NEGATIVE
Spec Grav, UA: 1.015 (ref 1.010–1.025)
Urobilinogen, UA: 0.2 E.U./dL
pH, UA: 5 (ref 5.0–8.0)

## 2019-04-07 LAB — CBC WITH DIFFERENTIAL/PLATELET
Basophils Absolute: 0 10*3/uL (ref 0.0–0.2)
Basos: 0 %
EOS (ABSOLUTE): 0.1 10*3/uL (ref 0.0–0.4)
Eos: 1 %
Hematocrit: 37.9 % (ref 37.5–51.0)
Hemoglobin: 13.3 g/dL (ref 13.0–17.7)
Immature Grans (Abs): 0 10*3/uL (ref 0.0–0.1)
Immature Granulocytes: 0 %
Lymphocytes Absolute: 1.6 10*3/uL (ref 0.7–3.1)
Lymphs: 19 %
MCH: 31.5 pg (ref 26.6–33.0)
MCHC: 35.1 g/dL (ref 31.5–35.7)
MCV: 90 fL (ref 79–97)
Monocytes Absolute: 0.4 10*3/uL (ref 0.1–0.9)
Monocytes: 5 %
Neutrophils Absolute: 6.6 10*3/uL (ref 1.4–7.0)
Neutrophils: 75 %
Platelets: 245 10*3/uL (ref 150–450)
RBC: 4.22 x10E6/uL (ref 4.14–5.80)
RDW: 12.5 % (ref 11.6–15.4)
WBC: 8.8 10*3/uL (ref 3.4–10.8)

## 2019-04-07 LAB — COMPREHENSIVE METABOLIC PANEL
ALT: 26 IU/L (ref 0–44)
AST: 24 IU/L (ref 0–40)
Albumin/Globulin Ratio: 2.1 (ref 1.2–2.2)
Albumin: 4.7 g/dL (ref 3.8–4.9)
Alkaline Phosphatase: 60 IU/L (ref 39–117)
BUN/Creatinine Ratio: 15 (ref 9–20)
BUN: 15 mg/dL (ref 6–24)
Bilirubin Total: 0.7 mg/dL (ref 0.0–1.2)
CO2: 22 mmol/L (ref 20–29)
Calcium: 9.6 mg/dL (ref 8.7–10.2)
Chloride: 100 mmol/L (ref 96–106)
Creatinine, Ser: 0.98 mg/dL (ref 0.76–1.27)
GFR calc Af Amer: 101 mL/min/{1.73_m2} (ref >59)
GFR calc non Af Amer: 88 mL/min/{1.73_m2} (ref >59)
Globulin, Total: 2.2 g/dL (ref 1.5–4.5)
Glucose: 93 mg/dL (ref 65–99)
Potassium: 3.8 mmol/L (ref 3.5–5.2)
Sodium: 138 mmol/L (ref 134–144)
Total Protein: 6.9 g/dL (ref 6.0–8.5)

## 2019-04-07 LAB — LIPID PANEL
Chol/HDL Ratio: 2.4 ratio (ref 0.0–5.0)
Cholesterol, Total: 147 mg/dL (ref 100–199)
HDL: 61 mg/dL (ref >39)
LDL Chol Calc (NIH): 70 mg/dL (ref 0–99)
Triglycerides: 83 mg/dL (ref 0–149)
VLDL Cholesterol Cal: 16 mg/dL (ref 5–40)

## 2019-04-07 LAB — TSH: TSH: 0.986 u[IU]/mL (ref 0.450–4.500)

## 2019-04-07 LAB — PSA: Prostate Specific Ag, Serum: 1.7 ng/mL (ref 0.0–4.0)

## 2019-04-29 ENCOUNTER — Other Ambulatory Visit: Payer: Self-pay | Admitting: Internal Medicine

## 2019-04-29 NOTE — Telephone Encounter (Signed)
Pt last saw Dr Angelena Form 02/18/19, last labs 04/06/19 Creat 0.98, age 53, weight 89kg, CrCl 109.74, based on specified criteria pt is on appropriate dosage of Xarelto 20mg  QD.  Will refill rx.

## 2019-05-10 ENCOUNTER — Telehealth: Payer: Self-pay

## 2019-05-10 NOTE — Telephone Encounter (Signed)
I called [facility] at [phone] and did a Dofetilide PA over the phone with Ronny Bacon. Per Ronny Bacon this PA has been approved for 1 year from todays date (05/09/2020).  I have notified the pts pharmacy of this approval.

## 2019-05-12 ENCOUNTER — Ambulatory Visit (HOSPITAL_COMMUNITY): Payer: BC Managed Care – PPO | Attending: Cardiology

## 2019-05-12 ENCOUNTER — Other Ambulatory Visit: Payer: Self-pay

## 2019-05-12 DIAGNOSIS — I351 Nonrheumatic aortic (valve) insufficiency: Secondary | ICD-10-CM | POA: Insufficient documentation

## 2019-06-14 ENCOUNTER — Other Ambulatory Visit: Payer: Self-pay | Admitting: Family Medicine

## 2019-06-14 DIAGNOSIS — I10 Essential (primary) hypertension: Secondary | ICD-10-CM

## 2019-06-14 NOTE — Telephone Encounter (Signed)
Requested Prescriptions  Pending Prescriptions Disp Refills  . losartan (COZAAR) 50 MG tablet [Pharmacy Med Name: LOSARTAN POTASSIUM 50 MG TAB] 90 tablet 0    Sig: Take 1 tablet (50 mg total) by mouth daily.     Cardiovascular:  Angiotensin Receptor Blockers Failed - 06/14/2019  3:24 PM      Failed - Valid encounter within last 6 months    Recent Outpatient Visits          2 months ago Annual physical exam   [facility] Jerrol Banana., MD   7 months ago Hyperlipidemia, unspecified hyperlipidemia type   [facility] Jerrol Banana., MD   10 months ago Hyperlipidemia, unspecified hyperlipidemia type   [facility] Jerrol Banana., MD   11 months ago History of kidney stones   [facility] Jerrol Banana., MD   1 year ago Sinusitis, unspecified chronicity, unspecified location   [name] Jerrol Banana., MD      Future Appointments            In 1 month Allred, Jeneen Rinks, MD [facility], [facility]           Passed - Cr in normal range and within 180 days    Creat  Date Value Ref Range Status  01/24/2016 0.93 0.70 - 1.33 mg/dL Final    Comment:      For patients > or = 54 years of age: The upper reference limit for Creatinine is approximately 13% higher for people identified as African-American.      Creatinine, Ser  Date Value Ref Range Status  04/06/2019 0.98 0.76 - 1.27 mg/dL Final         Passed - K in normal range and within 180 days    Potassium  Date Value Ref Range Status  04/06/2019 3.8 3.5 - 5.2 mmol/L Final         Passed - Patient is not pregnant      Passed - Last BP in normal range    BP Readings from Last 1 Encounters:  04/06/19 121/80

## 2019-07-08 ENCOUNTER — Telehealth (HOSPITAL_COMMUNITY): Payer: Self-pay

## 2019-07-08 NOTE — Telephone Encounter (Signed)
Encounter complete. 

## 2019-07-09 ENCOUNTER — Other Ambulatory Visit (HOSPITAL_COMMUNITY)
Admission: RE | Admit: 2019-07-09 | Discharge: 2019-07-09 | Disposition: A | Discharge status: Home / Self Care | Payer: BC Managed Care – PPO | Source: Ambulatory Visit | Attending: Cardiology | Admitting: Cardiology

## 2019-07-09 DIAGNOSIS — Z01812 Encounter for preprocedural laboratory examination: Secondary | ICD-10-CM | POA: Insufficient documentation

## 2019-07-09 DIAGNOSIS — Z20822 Contact with and (suspected) exposure to covid-19: Secondary | ICD-10-CM | POA: Diagnosis not present

## 2019-07-09 LAB — SARS CORONAVIRUS 2 (TAT 6-24 HRS): SARS Coronavirus 2: NEGATIVE

## 2019-07-13 ENCOUNTER — Other Ambulatory Visit: Payer: Self-pay

## 2019-07-13 ENCOUNTER — Ambulatory Visit (HOSPITAL_COMMUNITY)
Admission: RE | Admit: 2019-07-13 | Discharge: 2019-07-13 | Disposition: A | Discharge status: Home / Self Care | Payer: BC Managed Care – PPO | Source: Ambulatory Visit | Attending: Cardiology | Admitting: Cardiology

## 2019-07-13 DIAGNOSIS — I2510 Atherosclerotic heart disease of native coronary artery without angina pectoris: Secondary | ICD-10-CM

## 2019-07-13 LAB — EXERCISE TOLERANCE TEST
Estimated workload: 13.4 METS
Exercise duration (min): 11 min
Exercise duration (sec): 1 s
MPHR: 167 {beats}/min
Peak HR: 184 {beats}/min
Percent HR: 110 %
Rest HR: 110 {beats}/min

## 2019-07-16 ENCOUNTER — Other Ambulatory Visit: Payer: Self-pay | Admitting: Family Medicine

## 2019-07-16 DIAGNOSIS — E785 Hyperlipidemia, unspecified: Secondary | ICD-10-CM

## 2019-07-22 ENCOUNTER — Other Ambulatory Visit: Payer: Self-pay | Admitting: Internal Medicine

## 2019-08-09 ENCOUNTER — Telehealth (INDEPENDENT_AMBULATORY_CARE_PROVIDER_SITE_OTHER): Payer: BC Managed Care – PPO | Admitting: Internal Medicine

## 2019-08-09 DIAGNOSIS — I480 Paroxysmal atrial fibrillation: Secondary | ICD-10-CM

## 2019-08-09 DIAGNOSIS — I2510 Atherosclerotic heart disease of native coronary artery without angina pectoris: Secondary | ICD-10-CM | POA: Diagnosis not present

## 2019-08-09 DIAGNOSIS — I10 Essential (primary) hypertension: Secondary | ICD-10-CM

## 2019-08-09 NOTE — Progress Notes (Signed)
Electrophysiology TeleHealth Note   Due to national recommendations of social distancing due to COVID 19, an audio/video telehealth visit is felt to be most appropriate for this patient at this time.  See MyChart message from today for the patient's consent to telehealth for [facility].  Date:  08/09/2019   ID:  Jorge [name], DOB November 30, 1965, MRN [id number]  Location: patient's home  Provider location:  [facility]  Evaluation Performed: Follow-up visit  PCP:  Jerrol Banana., MD   Electrophysiologist:  Dr Rayann Heman  Chief Complaint:  palpitations  History of Present Illness:    Jorge [name] is a 54 y.o. male who presents via telehealth conferencing today.  Since last being seen in our clinic, the patient reports doing very well.  Today, he denies symptoms of palpitations, chest pain, shortness of breath,  lower extremity edema, dizziness, presyncope, or syncope.  The patient is otherwise without complaint today.  The patient denies symptoms of fevers, chills, cough, or new SOB worrisome for COVID 19.  Past Medical History:  Diagnosis Date  . Atrial fibrillation ([place])    first diagnosed 2011; s/p DCCV at that time; ablation 10-2013 & 03/2014; failed multaq, flecainide and amiodarone  . Chronic anticoagulation    Xarelto  . Chronic kidney disease    H/O KIDNEY STONES  . Dysrhythmia   . Headache   . Hyperlipidemia   . Hypertension   . Sinusitis     Past Surgical History:  Procedure Laterality Date  . APPENDECTOMY  1975  . ATRIAL FIBRILLATION ABLATION N/A 11/02/2013   PVI by Dr Rayann Heman  . ATRIAL FIBRILLATION ABLATION N/A 03/29/2014   PVI and CTI by Dr Rayann Heman  . ATRIAL FIBRILLATION ABLATION N/A 07/04/2016   Procedure: Atrial Fibrillation Ablation;  Surgeon: Thompson Grayer, MD;  Location: [place] [facility];  Service: Cardiovascular;  Laterality: N/A;  . CARDIOVERSION     Hx of  . CARDIOVERSION N/A 10/15/2013   Procedure: CARDIOVERSION;  Surgeon: Fay Records, MD;  Location: [place];  Service: Cardiovascular;  Laterality: N/A;  . COLONOSCOPY  2016  . CORONARY STENT INTERVENTION N/A 12/23/2017   Procedure: CORONARY STENT INTERVENTION;  Surgeon: Burnell Blanks, MD;  Location: [place] [facility];  Service: Cardiovascular;  Laterality: N/A;  . HERNIA REPAIR Left 05/28/2013   left inguinal henria repair, robotic direct defect repaired with Bard ultralight mesh.  Marland Kitchen HERNIA REPAIR Left 05/31/2015   Large Atrium pro loop mesh, direct defect.   . INGUINAL HERNIA REPAIR Left 05/31/2015   Procedure: HERNIA REPAIR INGUINAL ADULT;  Surgeon: Robert Bellow, MD;  Location: [facility] ORS;  Service: General;  Laterality: Left;  . KNEE SURGERY  2009   [facility]  . LITHOTRIPSY     x 3  last one  2013  . RHINOPLASTY  2006   Dr. Nadeen Landau, [facility]  . RIGHT/LEFT HEART CATH AND CORONARY ANGIOGRAPHY N/A 12/23/2017   Procedure: RIGHT/LEFT HEART CATH AND CORONARY ANGIOGRAPHY;  Surgeon: Burnell Blanks, MD;  Location: [place] [facility];  Service: Cardiovascular;  Laterality: N/A;  . TEE WITHOUT CARDIOVERSION N/A 11/01/2013   Procedure: TRANSESOPHAGEAL ECHOCARDIOGRAM (TEE);  Surgeon: Candee Furbish, MD;  Location: [facility] ENDOSCOPY;  Service: Cardiovascular;  Laterality: N/A;  . TEE WITHOUT CARDIOVERSION N/A 03/28/2014   Procedure: TRANSESOPHAGEAL ECHOCARDIOGRAM (TEE);  Surgeon: Larey Dresser, MD;  Location: [facility];  Service: Cardiovascular;  Laterality: N/A;    Current Outpatient Medications  Medication Sig Dispense Refill  . XARELTO 20 MG TABS tablet Take 1 tablet (20 mg total) by mouth daily with supper. 90 tablet 1  . clopidogrel (PLAVIX) 75 MG tablet Take 1 tablet (75 mg total) by mouth daily with breakfast. 90 tablet 3  . diltiazem (CARDIZEM CD) 240 MG 24 hr capsule Take 1 capsule (240 mg total) by mouth daily. 90 capsule 1  . diltiazem (CARDIZEM) 30 MG tablet TAKE ONE TO TWO TABLETS EVERY 6 HOURS AS NEEDED FOR PALPITATIONS. 30  tablet 11  . dofetilide (TIKOSYN) 500 MCG capsule Take 1 capsule (500 mcg total) by mouth 2 (two) times daily. 180 capsule 3  . fluticasone (FLONASE) 50 MCG/ACT nasal spray Place into both nostrils daily.    Marland Kitchen losartan (COZAAR) 50 MG tablet Take 1 tablet (50 mg total) by mouth daily. 90 tablet 0  . montelukast (SINGULAIR) 10 MG tablet Take 10 mg by mouth at bedtime.    . nitroGLYCERIN (NITROSTAT) 0.4 MG SL tablet Place 1 tablet (0.4 mg total) under the tongue every 5 (five) minutes as needed. 25 tablet 2  . rosuvastatin (CRESTOR) 40 MG tablet Take 1 tablet (40 mg total) by mouth daily. 90 tablet 2  . tamsulosin (FLOMAX) 0.4 MG CAPS capsule Take 0.4 mg by mouth as needed (PROSTATE).      No current facility-administered medications for this visit.    Allergies:   Oxycodone and Vicodin [hydrocodone-acetaminophen]   Social History:  The patient  reports that he has never smoked. He has never used smokeless tobacco. He reports current alcohol use of about 2.0 standard drinks of alcohol per week. He reports that he does not use drugs.   ROS:  Please see the history of present illness.   All other systems are personally reviewed and negative.    Exam:    Vital Signs:  There were no vitals taken for this visit.  Well sounding and appearing, alert and conversant, regular work of breathing,  good skin color Eyes- anicteric, neuro- grossly intact, skin- no apparent rash or lesions or cyanosis, mouth- oral mucosa is pink  Labs/Other Tests and Data Reviewed:    Recent Labs: 02/18/2019: Magnesium 1.9 04/06/2019: ALT 26; BUN 15; Creatinine, Ser 0.98; Hemoglobin 13.3; Platelets 245; Potassium 3.8; Sodium 138; TSH 0.986   Wt Readings from Last 3 Encounters:  04/06/19 196 lb 3.2 oz (89 kg)  02/18/19 196 lb (88.9 kg)  02/05/19 192 lb (87.1 kg)    Echo 05/12/2019/ ETT reviewed with patient today  ASSESSMENT & PLAN:    1.  Persistent atrial fibrillation Doing reasonably well  He has intermittent  palpitations but overall feels that his quality of life is good.  He worries about longevity and progression of his afib.  He is concerned that his afib may be progressing.  We discussed long term monitoring with an Implantable loop recorder for afib management post ablation and to further evaluate his palpitations.  Risks of the procedure including bleeding and infection were considered.  He wishes to proceed.  We will schedule at the next available time for in office implant.  ecg from 10/20 is reviewed.  He was in sinus and qt was stable It is important to monitor closely on tikosyn to avoid toxicity. Will obtain ecg and and labs when he comes for ILR implant chads2vasc score is 2.  He is on xarelto  We may ultimately consider covergent procedure, maze, or repeat ablation with LOM ablation  2. CAD No ischemic symptoms  Dr Alyse Low notes are reviewed  3. HTN Stable No change required today   Follow-up:  In office ILR visit to be scheduled   Patient Risk:  after full review of this patients clinical status, I feel that they are at moderate risk at this time.  Today, I have spent 15 minutes with the patient with telehealth technology discussing arrhythmia management .    Army Fossa, MD  08/09/2019 9:47 AM     [address] [place] [phone] (office) [phone] (fax)

## 2019-09-08 ENCOUNTER — Other Ambulatory Visit: Payer: Self-pay

## 2019-09-08 ENCOUNTER — Ambulatory Visit: Payer: BC Managed Care – PPO | Admitting: Internal Medicine

## 2019-09-08 ENCOUNTER — Encounter: Payer: Self-pay | Admitting: Internal Medicine

## 2019-09-08 VITALS — BP 120/84 | HR 101 | Ht 70.5 in | Wt 192.0 lb

## 2019-09-08 DIAGNOSIS — I10 Essential (primary) hypertension: Secondary | ICD-10-CM | POA: Diagnosis not present

## 2019-09-08 DIAGNOSIS — I480 Paroxysmal atrial fibrillation: Secondary | ICD-10-CM | POA: Diagnosis not present

## 2019-09-08 DIAGNOSIS — I2510 Atherosclerotic heart disease of native coronary artery without angina pectoris: Secondary | ICD-10-CM | POA: Diagnosis not present

## 2019-09-08 DIAGNOSIS — Z79899 Other long term (current) drug therapy: Secondary | ICD-10-CM | POA: Diagnosis not present

## 2019-09-08 DIAGNOSIS — Z95818 Presence of other cardiac implants and grafts: Secondary | ICD-10-CM

## 2019-09-08 HISTORY — DX: Presence of other cardiac implants and grafts: Z95.818

## 2019-09-08 HISTORY — PX: OTHER SURGICAL HISTORY: SHX169

## 2019-09-08 LAB — BASIC METABOLIC PANEL
BUN/Creatinine Ratio: 16 (ref 9–20)
BUN: 15 mg/dL (ref 6–24)
CO2: 25 mmol/L (ref 20–29)
Calcium: 9.5 mg/dL (ref 8.7–10.2)
Chloride: 102 mmol/L (ref 96–106)
Creatinine, Ser: 0.94 mg/dL (ref 0.76–1.27)
GFR calc Af Amer: 106 mL/min/{1.73_m2} (ref >59)
GFR calc non Af Amer: 92 mL/min/{1.73_m2} (ref >59)
Glucose: 95 mg/dL (ref 65–99)
Potassium: 4.2 mmol/L (ref 3.5–5.2)
Sodium: 140 mmol/L (ref 134–144)

## 2019-09-08 LAB — MAGNESIUM: Magnesium: 1.9 mg/dL (ref 1.6–2.3)

## 2019-09-08 NOTE — Patient Instructions (Signed)
Medication Instructions:  Your physician recommends that you continue on your current medications as directed. Please refer to the Current Medication list given to you today.  *If you need a refill on your cardiac medications before your next appointment, please call your pharmacy*   Lab Work: Tikosyn surveillance labs today: BMET & Magnesium  If you have labs (blood work) drawn today and your tests are completely normal, you will receive your results only by: Marland Kitchen MyChart Message (if you have MyChart) OR . A paper copy in the mail If you have any lab test that is abnormal or we need to change your treatment, we will call you to review the results.   Testing/Procedures: You had a loop recorder implanted today in the office.    Follow-Up: At [facility], you and your health needs are our priority.  As part of our continuing mission to provide you with exceptional heart care, we have created designated Provider Care Teams.  These Care Teams include your primary Cardiologist (physician) and Advanced Practice Providers (APPs -  Physician Assistants and Nurse Practitioners) who all work together to provide you with the care you need, when you need it.  We recommend signing up for the patient portal called "MyChart".  Sign up information is provided on this After Visit Summary.  MyChart is used to connect with patients for Virtual Visits (Telemedicine).  Patients are able to view lab/test results, encounter notes, upcoming appointments, etc.  Non-urgent messages can be sent to your provider as well.   To learn more about what you can do with MyChart, go to [email or web address].    Your next appointment:   3 month(s)  The format for your next appointment:   Either In Person or Virtual  Provider:   Thompson Grayer, MD   Thank you for choosing [facility]!!     Other Instructions

## 2019-09-08 NOTE — Progress Notes (Signed)
PCP: Jerrol Banana., MD   Primary EP: Dr Rayann Heman  [name] is a 54 y.o. male who presents today for routine electrophysiology followup.  Since last being seen in our clinic, the patient reports doing very well.  He continues to have palpitations. Today, he denies symptoms of chest pain, shortness of breath,  lower extremity edema, dizziness, presyncope, or syncope.  The patient is otherwise without complaint today.   Past Medical History:  Diagnosis Date  . Atrial fibrillation ([place])    first diagnosed 2011; s/p DCCV at that time; ablation 10-2013 & 03/2014; failed multaq, flecainide and amiodarone  . Chronic anticoagulation    Xarelto  . Chronic kidney disease    H/O KIDNEY STONES  . Dysrhythmia   . Headache   . Hyperlipidemia   . Hypertension   . Sinusitis    Past Surgical History:  Procedure Laterality Date  . APPENDECTOMY  1975  . ATRIAL FIBRILLATION ABLATION N/A 11/02/2013   PVI by Dr Rayann Heman  . ATRIAL FIBRILLATION ABLATION N/A 03/29/2014   PVI and CTI by Dr Rayann Heman  . ATRIAL FIBRILLATION ABLATION N/A 07/04/2016   Procedure: Atrial Fibrillation Ablation;  Surgeon: Thompson Grayer, MD;  Location: [place] [facility];  Service: Cardiovascular;  Laterality: N/A;  . CARDIOVERSION     Hx of  . CARDIOVERSION N/A 10/15/2013   Procedure: CARDIOVERSION;  Surgeon: Fay Records, MD;  Location: [place];  Service: Cardiovascular;  Laterality: N/A;  . COLONOSCOPY  2016  . CORONARY STENT INTERVENTION N/A 12/23/2017   Procedure: CORONARY STENT INTERVENTION;  Surgeon: Burnell Blanks, MD;  Location: [place] [facility];  Service: Cardiovascular;  Laterality: N/A;  . HERNIA REPAIR Left 05/28/2013   left inguinal henria repair, robotic direct defect repaired with Bard ultralight mesh.  Marland Kitchen HERNIA REPAIR Left 05/31/2015   Large [employer] pro loop mesh, direct defect.   . INGUINAL HERNIA REPAIR Left 05/31/2015   Procedure: HERNIA REPAIR INGUINAL ADULT;  Surgeon: Robert Bellow,  MD;  Location: [facility];  Service: General;  Laterality: Left;  . KNEE SURGERY  2009   [facility]  . LITHOTRIPSY     x 3  last one  2013  . RHINOPLASTY  2006   Dr. Nadeen Landau, [facility]  . RIGHT/LEFT HEART CATH AND CORONARY ANGIOGRAPHY N/A 12/23/2017   Procedure: RIGHT/LEFT HEART CATH AND CORONARY ANGIOGRAPHY;  Surgeon: Burnell Blanks, MD;  Location: [place] [facility];  Service: Cardiovascular;  Laterality: N/A;  . TEE WITHOUT CARDIOVERSION N/A 11/01/2013   Procedure: TRANSESOPHAGEAL ECHOCARDIOGRAM (TEE);  Surgeon: Candee Furbish, MD;  Location: [facility];  Service: Cardiovascular;  Laterality: N/A;  . TEE WITHOUT CARDIOVERSION N/A 03/28/2014   Procedure: TRANSESOPHAGEAL ECHOCARDIOGRAM (TEE);  Surgeon: Larey Dresser, MD;  Location: [place];  Service: Cardiovascular;  Laterality: N/A;    ROS- all systems are reviewed and negatives except as per HPI above  Current Outpatient Medications  Medication Sig Dispense Refill  . aspirin 81 MG EC tablet Take 1 tablet by mouth daily.    Marland Kitchen azelastine (ASTELIN) 0.1 % nasal spray Place 1 spray into both nostrils 2 (two) times daily.    Marland Kitchen diltiazem (CARDIZEM CD) 240 MG 24 hr capsule Take 1 capsule (240 mg total) by mouth daily. 90 capsule 1  . diltiazem (CARDIZEM) 30 MG tablet TAKE ONE TO TWO TABLETS EVERY 6 HOURS AS NEEDED FOR PALPITATIONS. 30 tablet 11  . dofetilide (TIKOSYN) 500 MCG capsule Take 1  capsule (500 mcg total) by mouth 2 (two) times daily. 180 capsule 3  . fluticasone (FLONASE) 50 MCG/ACT nasal spray Place into both nostrils daily.    Marland Kitchen losartan (COZAAR) 50 MG tablet Take 1 tablet (50 mg total) by mouth daily. 90 tablet 0  . montelukast (SINGULAIR) 10 MG tablet Take 10 mg by mouth at bedtime.    . nitroGLYCERIN (NITROSTAT) 0.4 MG SL tablet Place 1 tablet (0.4 mg total) under the tongue every 5 (five) minutes as needed. 25 tablet 2  . rosuvastatin (CRESTOR) 40 MG tablet Take 1 tablet (40 mg total) by mouth  daily. 90 tablet 2  . tamsulosin (FLOMAX) 0.4 MG CAPS capsule Take 0.4 mg by mouth as needed (PROSTATE).     Marland Kitchen XARELTO 20 MG TABS tablet Take 1 tablet (20 mg total) by mouth daily with supper. 90 tablet 1   No current facility-administered medications for this visit.    Physical Exam: Vitals:   09/08/19 0806  BP: 120/84  Pulse: (!) 101  SpO2: 97%  Weight: 192 lb (87.1 kg)  Height: 5' 10.5" (1.791 m)    GEN- The patient is well appearing, alert and oriented x 3 today.   Head- normocephalic, atraumatic Eyes-  Sclera clear, conjunctiva pink Ears- hearing intact Oropharynx- clear Lungs- normal work of breathing Heart- irregular rate and rhythm  GI- soft  Extremities- no clubbing, cyanosis, or edema  Wt Readings from Last 3 Encounters:  09/08/19 192 lb (87.1 kg)  04/06/19 196 lb 3.2 oz (89 kg)  02/18/19 196 lb (88.9 kg)    ekg today reveals afib, V rate 101, qt is stable  Assessment and Plan:  1. Persistent afib Difficult to know his actual afib burden based on symptoms.  I worry that his AF burden is high and V rates uncontrolled.  chads2vasc is 2,  He is on xarelto We may ultimately consider covergent procedure, maze, or repeat ablation with LOM ablation I would therefore advise implantation of an implantable loop recorder for long term arrhythmia monitoring.  Risks and benefits to ILR were discussed at length with the patient today, including but not limited to risks of bleeding and infection.  Extensive device education was performed.  Remote monitoring was also discussed at length today.  The patient understands and wishes to proceed.  We will proceed at this time with ILR implantation.  2 . CAD No ischemic symptoms  3. HTN Stable No change required today   Thompson Grayer MD, [facility] 09/08/2019 8:34 AM      DESCRIPTION OF PROCEDURE:  Informed written consent was obtained.  The patient required no sedation for the procedure today.  The patients left chest was prepped  and draped. Mapping over the patient's chest was performed to identify the appropriate ILR site.  This area was found to be the left parasternal region over the 3rd-4th intercostal space.  The skin overlying this region was infiltrated with lidocaine for local analgesia.  A 0.5-cm incision was made at the implant site.  A subcutaneous ILR pocket was fashioned using a combination of sharp and blunt dissection.  A Medtronic Reveal Linq model LNQ 22 (SN [id number]) implantable loop recorder was then placed into the pocket R waves were very prominent and measured > 0.2 mV. EBL<1 ml.  Steri- Strips and a sterile dressing were then applied.  There were no early apparent complications.     CONCLUSIONS:   1. Successful implantation of a Medtronic Reveal LINQ implantable loop recorder for afib management  post ablation and to further evaluate palpitations  2. No early apparent complications.   Thompson Grayer MD, [facility] 09/08/2019 8:34 AM

## 2019-09-13 ENCOUNTER — Other Ambulatory Visit: Payer: Self-pay | Admitting: Internal Medicine

## 2019-09-13 ENCOUNTER — Telehealth: Payer: Self-pay | Admitting: Emergency Medicine

## 2019-09-13 NOTE — Telephone Encounter (Signed)
LMOM to call office. Checking on patient to assess due to AT/AF burden 85.9% . Ensure Xarelto compliant.

## 2019-09-13 NOTE — Telephone Encounter (Signed)
Patient made aware that monitor is transmitting. Asymptomatic. 09/13/19 transmission at 0930 showed SR. Patient is taking Xarelto as directed. Patient will call office if he becomes symptomatic.

## 2019-09-13 NOTE — Telephone Encounter (Signed)
I let the pt know his monitor sent an alert for a-fib. I told him I will have the nurse to call him back. He said he will try to make sure he answer.

## 2019-09-17 ENCOUNTER — Other Ambulatory Visit: Payer: Self-pay | Admitting: Family Medicine

## 2019-09-17 DIAGNOSIS — I10 Essential (primary) hypertension: Secondary | ICD-10-CM

## 2019-10-11 ENCOUNTER — Ambulatory Visit (INDEPENDENT_AMBULATORY_CARE_PROVIDER_SITE_OTHER): Payer: BC Managed Care – PPO | Admitting: *Deleted

## 2019-10-11 DIAGNOSIS — I480 Paroxysmal atrial fibrillation: Secondary | ICD-10-CM

## 2019-10-11 LAB — CUP PACEART REMOTE DEVICE CHECK
Date Time Interrogation Session: 20210614105553
Implantable Pulse Generator Implant Date: 20210512

## 2019-10-11 NOTE — Progress Notes (Signed)
Carelink Summary Report / Loop Recorder 

## 2019-10-14 LAB — CUP PACEART REMOTE DEVICE CHECK
Date Time Interrogation Session: 20210617000345
Implantable Pulse Generator Implant Date: 20210512

## 2019-11-15 ENCOUNTER — Ambulatory Visit (INDEPENDENT_AMBULATORY_CARE_PROVIDER_SITE_OTHER): Payer: BC Managed Care – PPO | Admitting: *Deleted

## 2019-11-15 DIAGNOSIS — I4891 Unspecified atrial fibrillation: Secondary | ICD-10-CM

## 2019-11-15 LAB — CUP PACEART REMOTE DEVICE CHECK
Date Time Interrogation Session: 20210719102702
Implantable Pulse Generator Implant Date: 20210512

## 2019-11-17 NOTE — Progress Notes (Signed)
Carelink Summary Report / Loop Recorder 

## 2019-11-19 ENCOUNTER — Telehealth: Payer: Self-pay | Admitting: Emergency Medicine

## 2019-11-19 NOTE — Telephone Encounter (Signed)
Alert received for increase in HVR. Patient has noticed that he has been "out of rhythm" more since November 03, 2019. He has been taking all medication as directed with no missed doses . He reports that he has not felt the need to take a prn dose of cardizem 30 mg. Patient has follow up with Dr Rayann Heman 12/16/19. Patient reports he does have SOB with activity at times but denies CP, dizziness, or syncope. ED precautions given for worsening cardiac condition. Patient will contact the device clinic if he has any change in condition.

## 2019-11-21 NOTE — Telephone Encounter (Signed)
Known afib with ILR implanted for long term monitoring. No need to call the patient for afib episodes.  He just needs to call us if he feels worsening symptoms.

## 2019-11-22 ENCOUNTER — Other Ambulatory Visit: Payer: Self-pay | Admitting: Cardiovascular Disease

## 2019-11-22 ENCOUNTER — Telehealth: Payer: Self-pay

## 2019-11-22 NOTE — Telephone Encounter (Signed)
See phone note 11/22/19.

## 2019-11-22 NOTE — Telephone Encounter (Signed)
Per Dr. Rayann Heman, only call if worsening symptoms. Patient called, informed and agreeable.

## 2019-11-22 NOTE — Telephone Encounter (Signed)
Noted. Called patient and informed him to call DC back at [phone] if he has any worsening symptoms. Verbalizes understanding.

## 2019-11-22 NOTE — Telephone Encounter (Signed)
Xarelto 20mg  refill request received. Pt is 54 years old, weight-87.1kg, Crea-0.94 on 09/08/2019, last seen by Dr. Rayann Heman on 09/08/2019, Diagnosis-Afib, CrCl-110.96ml/min; Dose is appropriate based on dosing criteria. Will send in refill to requested pharmacy.

## 2019-12-10 ENCOUNTER — Telehealth: Payer: Self-pay

## 2019-12-10 DIAGNOSIS — N200 Calculus of kidney: Secondary | ICD-10-CM

## 2019-12-10 NOTE — Telephone Encounter (Signed)
Referral placed to urology for patient.

## 2019-12-10 NOTE — Addendum Note (Signed)
Addended by: Gerald Stabs on: 12/10/2019 01:21 PM   Modules accepted: Orders

## 2019-12-10 NOTE — Telephone Encounter (Signed)
Okay to refer? 

## 2019-12-10 NOTE — Telephone Encounter (Signed)
Copied from [place] [id number]. Topic: Referral - Request for Referral >> Dec 10, 2019  9:20 AM Lennox Solders wrote: Has patient seen PCP for this complaint? Yes years ago. Pt was referred to dr Rodman Key [facility] urologist for kidney stones. Pt called their office and needs another referral. Pt has bcbs. Per pt was told to get another referral and they will see him. Pt  said kidney stones returned on Wednesday. Pt is having a little blood in urine. Dr eskridge phone number is [phone]

## 2019-12-13 ENCOUNTER — Other Ambulatory Visit: Payer: Self-pay | Admitting: Family Medicine

## 2019-12-13 DIAGNOSIS — I10 Essential (primary) hypertension: Secondary | ICD-10-CM

## 2019-12-13 NOTE — Telephone Encounter (Signed)
Attempted to call patient to schedule follow-up visit- left message on VM to call office for appointment. Courtesy RF given #[phone]

## 2019-12-15 NOTE — Progress Notes (Signed)
I,April Miller,acting as a scribe for Wilhemena Durie, MD.,have documented all relevant documentation on the behalf of Wilhemena Durie, MD,as directed by  Wilhemena Durie, MD while in the presence of Wilhemena Durie, MD.   Established patient visit   Patient: Jorge [name]   DOB: 1965/06/27   54 y.o. Male  MRN: [id number] Visit Date: 12/16/2019  Today's healthcare provider: Wilhemena Durie, MD   Chief Complaint  Patient presents with  . Follow-up  . Hyperlipidemia  . Hypertension   Subjective    HPI  Patient needs refills on allergy medicines, montelukast, Flonase, Astelin. Hypertension, follow-up  BP Readings from Last 3 Encounters:  12/16/19 104/67  12/16/19 126/78  09/08/19 120/84   Wt Readings from Last 3 Encounters:  12/16/19 203 lb (92.1 kg)  12/16/19 200 lb 3.2 oz (90.8 kg)  09/08/19 192 lb (87.1 kg)     He was last seen for hypertension 8 months ago.  BP at that visit was 121/80. Management since that visit includes; on diltiazem and losartan. He reports good compliance with treatment. He is not having side effects. none He is exercising. He is not adherent to low salt diet.   Outside blood pressures are not checking.  He does not smoke.  Use of agents associated with hypertension: none.   -------------------------------------------------------------------- Lipid/Cholesterol, follow-up  Last Lipid Panel: Lab Results  Component Value Date   CHOL 147 04/06/2019   LDLCALC 70 04/06/2019   HDL 61 04/06/2019   TRIG 83 04/06/2019    He was last seen for this 8 months ago.  Management since that visit includes; labs checked showing-okay. He reports good compliance with treatment. He is not having side effects. none He is following a Regular diet. Current exercise: walking  Last metabolic panel Lab Results  Component Value Date   GLUCOSE 95 09/08/2019   NA 140 09/08/2019   K 4.2 09/08/2019   BUN 15 09/08/2019   CREATININE  0.94 09/08/2019   GFRNONAA 92 09/08/2019   GFRAA 106 09/08/2019   CALCIUM 9.5 09/08/2019   AST 24 04/06/2019   ALT 26 04/06/2019   The 10-year ASCVD risk score Mikey Bussing DC Jr., et al., 2013) is: 2.4%  --------------------------------------------------------------------       Medications: Outpatient Medications Prior to Visit  Medication Sig  . aspirin 81 MG EC tablet Take 1 tablet by mouth daily.  Marland Kitchen diltiazem (CARDIZEM CD) 240 MG 24 hr capsule Take 1 capsule (240 mg total) by mouth daily.  Marland Kitchen diltiazem (CARDIZEM) 30 MG tablet TAKE ONE TO TWO TABLETS EVERY 6 HOURS AS NEEDED FOR PALPITATIONS.  Marland Kitchen dofetilide (TIKOSYN) 500 MCG capsule Take 1 capsule (500 mcg total) by mouth 2 (two) times daily.  Marland Kitchen losartan (COZAAR) 50 MG tablet Take 1 tablet (50 mg total) by mouth daily.  . nitroGLYCERIN (NITROSTAT) 0.4 MG SL tablet Place 1 tablet (0.4 mg total) under the tongue every 5 (five) minutes as needed.  . rosuvastatin (CRESTOR) 40 MG tablet Take 1 tablet (40 mg total) by mouth daily.  . tamsulosin (FLOMAX) 0.4 MG CAPS capsule Take 0.4 mg by mouth as needed (PROSTATE).   Marland Kitchen XARELTO 20 MG TABS tablet Take 1 tablet (20 mg total) by mouth daily with supper.  . [DISCONTINUED] azelastine (ASTELIN) 0.1 % nasal spray Place 1 spray into both nostrils 2 (two) times daily.  . [DISCONTINUED] fluticasone (FLONASE) 50 MCG/ACT nasal spray Place into both nostrils daily.  . [DISCONTINUED] montelukast (SINGULAIR) 10 MG  tablet Take 10 mg by mouth at bedtime.   No facility-administered medications prior to visit.    Review of Systems  Constitutional: Negative for appetite change, chills and fever.  Respiratory: Negative for chest tightness, shortness of breath and wheezing.   Cardiovascular: Negative for chest pain and palpitations.  Gastrointestinal: Negative for abdominal pain, nausea and vomiting.       Objective    BP 104/67 (BP Location: Right Arm, Patient Position: Sitting, Cuff Size: Large)   Pulse  (!) 107   Temp 98.4 F (36.9 C) (Oral)   Resp 16   Ht 5\' 11"  (1.803 m)   Wt 203 lb (92.1 kg)   SpO2 97%   BMI 28.31 kg/m     Physical Exam  BP 104/67 (BP Location: Right Arm, Patient Position: Sitting, Cuff Size: Large)   Pulse (!) 107   Temp 98.4 F (36.9 C) (Oral)   Resp 16   Ht 5\' 11"  (1.803 m)   Wt 203 lb (92.1 kg)   SpO2 97%   BMI 28.31 kg/m   General Appearance:    Alert, cooperative, no distress, appears stated age  Head:    Normocephalic, without obvious abnormality, atraumatic  Eyes:    PERRL, conjunctiva/corneas clear, EOM's intact, fundi    benign, both eyes       Ears:    Normal TM's and external ear canals, both ears  Nose:   Nares normal, septum midline, mucosa normal, no drainage   or sinus tenderness  Throat:   Lips, mucosa, and tongue normal; teeth and gums normal  Neck:   Supple, symmetrical, trachea midline, no adenopathy;       thyroid:  No enlargement/tenderness/nodules; no carotid   bruit or JVD  Back:     Symmetric, no curvature, ROM normal, no CVA tenderness  Lungs:     Clear to auscultation bilaterally, respirations unlabored  Chest wall:    No tenderness or deformity  Heart:    Regular rate and rhythm, S1 and S2 normal, no murmur, rub   or gallop  Abdomen:     Soft, non-tender, bowel sounds active all four quadrants,    no masses, no organomegaly  Genitalia:    Normal male without lesion, discharge or tenderness  Rectal:    Normal tone, normal prostate, no masses or tenderness;   guaiac negative stool  Extremities:   Extremities normal, atraumatic, no cyanosis or edema  Pulses:   2+ and symmetric all extremities  Skin:   Skin color, texture, turgor normal, no rashes or lesions  Lymph nodes:   Cervical, supraclavicular, and axillary nodes normal  Neurologic:   CNII-XII intact. Normal strength, sensation and reflexes      throughout     Results for orders placed or performed in visit on 12/16/19  [facility] [place]  Result  Value Ref Range   Pulse Generator Manufacturer MERM    Date Time Interrogation Session [id number]    Pulse Gen Model [id number] Lurline Del    Pulse Gen Serial Number [id number] [facility] [place]    Implantable Pulse Generator Type ICM/ILR    Implantable Pulse Generator Implant Date [id number]    Eval Rhythm AF 80-140bpm     Assessment & Plan     1. Essential hypertension Controlled.  2. Seasonal allergic rhinitis due to pollen  - azelastine (ASTELIN) 0.1 % nasal spray; Place 1 spray into both nostrils 2 (two) times daily.  Dispense: 30 mL; Refill:  12 - fluticasone (FLONASE) 50 MCG/ACT nasal spray; Place 2 sprays into both nostrils daily.  Dispense: 16 g; Refill: 3 - montelukast (SINGULAIR) 10 MG tablet; Take 1 tablet (10 mg total) by mouth at bedtime.  Dispense: 90 tablet; Refill: 3   Return in about 4 months (around 04/16/2020) for CPE.         Stacy Deshler Cranford Mon, MD  [facility] [phone] (phone) [phone] (fax)  [place]

## 2019-12-16 ENCOUNTER — Encounter: Payer: Self-pay | Admitting: Family Medicine

## 2019-12-16 ENCOUNTER — Other Ambulatory Visit: Payer: Self-pay

## 2019-12-16 ENCOUNTER — Encounter: Payer: Self-pay | Admitting: Internal Medicine

## 2019-12-16 ENCOUNTER — Ambulatory Visit: Payer: BC Managed Care – PPO | Admitting: Internal Medicine

## 2019-12-16 ENCOUNTER — Ambulatory Visit: Payer: BC Managed Care – PPO | Admitting: Family Medicine

## 2019-12-16 VITALS — BP 104/67 | HR 107 | Temp 98.4°F | Resp 16 | Ht 71.0 in | Wt 203.0 lb

## 2019-12-16 VITALS — BP 126/78 | HR 113 | Ht 70.5 in | Wt 200.2 lb

## 2019-12-16 DIAGNOSIS — I10 Essential (primary) hypertension: Secondary | ICD-10-CM | POA: Diagnosis not present

## 2019-12-16 DIAGNOSIS — I2510 Atherosclerotic heart disease of native coronary artery without angina pectoris: Secondary | ICD-10-CM | POA: Diagnosis not present

## 2019-12-16 DIAGNOSIS — Z01810 Encounter for preprocedural cardiovascular examination: Secondary | ICD-10-CM

## 2019-12-16 DIAGNOSIS — I480 Paroxysmal atrial fibrillation: Secondary | ICD-10-CM | POA: Diagnosis not present

## 2019-12-16 DIAGNOSIS — J301 Allergic rhinitis due to pollen: Secondary | ICD-10-CM | POA: Diagnosis not present

## 2019-12-16 LAB — CUP PACEART INCLINIC DEVICE CHECK
Date Time Interrogation Session: 20210819085121
Implantable Pulse Generator Implant Date: 20210512

## 2019-12-16 MED ORDER — AZELASTINE HCL 0.1 % NA SOLN: 1.0000 | Freq: Two times a day (BID) | NASAL | 12 refills | Status: AC

## 2019-12-16 MED ORDER — FLUTICASONE PROPIONATE 50 MCG/ACT NA SUSP: 2.0000 | Freq: Every day | NASAL | 3 refills | Status: DC

## 2019-12-16 MED ORDER — MONTELUKAST SODIUM 10 MG PO TABS: 10.0000 mg | ORAL_TABLET | Freq: Every day | ORAL | 3 refills | Status: DC

## 2019-12-16 NOTE — Patient Instructions (Addendum)
Medication Instructions:  *If you need a refill on your cardiac medications before your next appointment, please call your pharmacy*  Lab Work: If you have labs (blood work) drawn today and your tests are completely normal, you will receive your results only by: Marland Kitchen MyChart Message (if you have MyChart) OR . A paper copy in the mail If you have any lab test that is abnormal or we need to change your treatment, we will call you to review the results.  Follow-Up: At [facility], you and your health needs are our priority.  As part of our continuing mission to provide you with exceptional heart care, we have created designated Provider Care Teams.  These Care Teams include your primary Cardiologist (physician) and Advanced Practice Providers (APPs -  Physician Assistants and Nurse Practitioners) who all work together to provide you with the care you need, when you need it.  We recommend signing up for the patient portal called "MyChart".  Sign up information is provided on this After Visit Summary.  MyChart is used to connect with patients for Virtual Visits (Telemedicine).  Patients are able to view lab/test results, encounter notes, upcoming appointments, etc.  Non-urgent messages can be sent to your provider as well.   To learn more about what you can do with MyChart, go to [email or web address].    Your next appointment:   Your physician wants you to follow-up in: 2 MONTHS with Dr. Rayann Heman. You will receive a reminder letter in the mail two months in advance. If you don't receive a letter, please call our office to schedule the follow-up appointment.  The format for your next appointment:   In Person with Thompson Grayer, MD  You have been referred to Dr. Darylene Price for consideration of MAZE procedure. Someone from his office will contact you to arrange consultation.

## 2019-12-16 NOTE — Progress Notes (Signed)
PCP: Jerrol Banana., MD Primary Cardiologist: Dr Angelena Form  Primary EP: Dr Rayann Heman  [place] Jorge [name] is a 54 y.o. male who presents today for routine electrophysiology followup.  Since last being seen in our clinic, the patient reports doing very well. His primary concern is with knee pain  He is planning to have knee surgery soon.  He has frequent afib but is mostly asymptomatic.  He does have some fatigue and reduced exercise tolerance at times. Today, he denies symptoms of palpitations, chest pain, shortness of breath,  lower extremity edema, dizziness, presyncope, or syncope.  The patient is otherwise without complaint today.   Past Medical History:  Diagnosis Date  . Atrial fibrillation ([place])    first diagnosed 2011; s/p DCCV at that time; ablation 10-2013 & 03/2014; failed multaq, flecainide and amiodarone  . Chronic anticoagulation    Xarelto  . Chronic kidney disease    H/O KIDNEY STONES  . Dysrhythmia   . Headache   . Hyperlipidemia   . Hypertension   . Sinusitis    Past Surgical History:  Procedure Laterality Date  . APPENDECTOMY  1975  . ATRIAL FIBRILLATION ABLATION N/A 11/02/2013   PVI by Dr Rayann Heman  . ATRIAL FIBRILLATION ABLATION N/A 03/29/2014   PVI and CTI by Dr Rayann Heman  . ATRIAL FIBRILLATION ABLATION N/A 07/04/2016   Procedure: Atrial Fibrillation Ablation;  Surgeon: Thompson Grayer, MD;  Location: [place] [facility];  Service: Cardiovascular;  Laterality: N/A;  . CARDIOVERSION     Hx of  . CARDIOVERSION N/A 10/15/2013   Procedure: CARDIOVERSION;  Surgeon: Fay Records, MD;  Location: [place];  Service: Cardiovascular;  Laterality: N/A;  . COLONOSCOPY  2016  . CORONARY STENT INTERVENTION N/A 12/23/2017   Procedure: CORONARY STENT INTERVENTION;  Surgeon: Burnell Blanks, MD;  Location: [place] [facility];  Service: Cardiovascular;  Laterality: N/A;  . HERNIA REPAIR Left 05/28/2013   left inguinal henria repair, robotic direct defect repaired with Bard  ultralight mesh.  Marland Kitchen HERNIA REPAIR Left 05/31/2015   Large Atrium pro loop mesh, direct defect.   . implantable loop recorder placement  09/08/2019   Medtronic Reveal Linq model LNQ 22 (SN [id number]) implantable loop recorder implanted in office by Dr Rayann Heman for afib management  . INGUINAL HERNIA REPAIR Left 05/31/2015   Procedure: HERNIA REPAIR INGUINAL ADULT;  Surgeon: Robert Bellow, MD;  Location: [facility];  Service: General;  Laterality: Left;  . KNEE SURGERY  2009   [facility]  . LITHOTRIPSY     x 3  last one  2013  . RHINOPLASTY  2006   Dr. Nadeen Landau, [facility]  . RIGHT/LEFT HEART CATH AND CORONARY ANGIOGRAPHY N/A 12/23/2017   Procedure: RIGHT/LEFT HEART CATH AND CORONARY ANGIOGRAPHY;  Surgeon: Burnell Blanks, MD;  Location: [place] [facility];  Service: Cardiovascular;  Laterality: N/A;  . TEE WITHOUT CARDIOVERSION N/A 11/01/2013   Procedure: TRANSESOPHAGEAL ECHOCARDIOGRAM (TEE);  Surgeon: Candee Furbish, MD;  Location: [facility];  Service: Cardiovascular;  Laterality: N/A;  . TEE WITHOUT CARDIOVERSION N/A 03/28/2014   Procedure: TRANSESOPHAGEAL ECHOCARDIOGRAM (TEE);  Surgeon: Larey Dresser, MD;  Location: [place];  Service: Cardiovascular;  Laterality: N/A;    ROS- all systems are reviewed and negatives except as per HPI above  Current Outpatient Medications  Medication Sig Dispense Refill  . aspirin 81 MG EC tablet Take 1 tablet by mouth daily.    Marland Kitchen azelastine (ASTELIN) 0.1 % nasal spray  Place 1 spray into both nostrils 2 (two) times daily.    Marland Kitchen diltiazem (CARDIZEM CD) 240 MG 24 hr capsule Take 1 capsule (240 mg total) by mouth daily. 90 capsule 1  . diltiazem (CARDIZEM) 30 MG tablet TAKE ONE TO TWO TABLETS EVERY 6 HOURS AS NEEDED FOR PALPITATIONS. 30 tablet 11  . dofetilide (TIKOSYN) 500 MCG capsule Take 1 capsule (500 mcg total) by mouth 2 (two) times daily. 180 capsule 3  . fluticasone (FLONASE) 50 MCG/ACT nasal spray Place into both nostrils  daily.    Marland Kitchen losartan (COZAAR) 50 MG tablet Take 1 tablet (50 mg total) by mouth daily. 30 tablet 0  . montelukast (SINGULAIR) 10 MG tablet Take 10 mg by mouth at bedtime.    . nitroGLYCERIN (NITROSTAT) 0.4 MG SL tablet Place 1 tablet (0.4 mg total) under the tongue every 5 (five) minutes as needed. 25 tablet 2  . rosuvastatin (CRESTOR) 40 MG tablet Take 1 tablet (40 mg total) by mouth daily. 90 tablet 2  . tamsulosin (FLOMAX) 0.4 MG CAPS capsule Take 0.4 mg by mouth as needed (PROSTATE).     Marland Kitchen XARELTO 20 MG TABS tablet Take 1 tablet (20 mg total) by mouth daily with supper. 90 tablet 1   No current facility-administered medications for this visit.    Physical Exam: Vitals:   12/16/19 0819  BP: 126/78  Pulse: (!) 113  SpO2: 98%  Weight: 200 lb 3.2 oz (90.8 kg)  Height: 5' 10.5" (1.791 m)    GEN- The patient is well appearing, alert and oriented x 3 today.   Head- normocephalic, atraumatic Eyes-  Sclera clear, conjunctiva pink Ears- hearing intact Oropharynx- clear Lungs-   normal work of breathing Heart- iRRR GI- soft,  Extremities- no clubbing, cyanosis, or edema  Wt Readings from Last 3 Encounters:  12/16/19 200 lb 3.2 oz (90.8 kg)  09/08/19 192 lb (87.1 kg)  04/06/19 196 lb 3.2 oz (89 kg)    EKG tracing ordered today is personally reviewed and shows afib, V rates 113  Assessment and Plan:  1. afib The patient has symptomatic, recurrent atrial fibrillation. he has failed medical therapy with tikosyn.  He is s/p ablation x 2 afib burden is 71% by ILR Chads2vasc score is 2.  he is anticoagulated with xarelto . Therapeutic strategies for afib including medicine and ablation were discussed in detail with the patient today. We may ultimately consider covergent procedure, maze, or repeat ablation with LOM ablation.  I will refer to Dr Lilly Cove for initial surgical conversation/ consultation.  2. CAD No ischemic symptoms  3. HTN Stable No change required today  4.  preop Ok to proceed with knee surgery without further CV testing May require perioperative beta blocker therapy (metoprolol 25mg  q4h prn) Do not avoid surgery due to afib as this is a chronic issue for the patient. Hold asa 5 days prior to surgery Hold xarelto 24 hours prior to surgery and resume when able post operatively   Risks, benefits and potential toxicities for medications prescribed and/or refilled reviewed with patient today.   Return to see me in 2 months  Thompson Grayer MD, [facility] 12/16/2019 8:54 AM

## 2019-12-17 ENCOUNTER — Ambulatory Visit (INDEPENDENT_AMBULATORY_CARE_PROVIDER_SITE_OTHER): Payer: BC Managed Care – PPO | Admitting: Urology

## 2019-12-17 ENCOUNTER — Encounter: Payer: Self-pay | Admitting: Urology

## 2019-12-17 VITALS — BP 128/88 | HR 130 | Ht 70.0 in | Wt 201.0 lb

## 2019-12-17 DIAGNOSIS — N200 Calculus of kidney: Secondary | ICD-10-CM | POA: Diagnosis not present

## 2019-12-17 DIAGNOSIS — R310 Gross hematuria: Secondary | ICD-10-CM | POA: Diagnosis not present

## 2019-12-17 DIAGNOSIS — R1011 Right upper quadrant pain: Secondary | ICD-10-CM | POA: Diagnosis not present

## 2019-12-17 MED ORDER — TRAMADOL HCL 50 MG PO TABS: 50.0000 mg | ORAL_TABLET | Freq: Four times a day (QID) | ORAL | 0 refills | Status: DC | PRN

## 2019-12-17 MED ORDER — TAMSULOSIN HCL 0.4 MG PO CAPS: 0.4000 mg | ORAL_CAPSULE | Freq: Every day | ORAL | 3 refills | Status: DC

## 2019-12-17 NOTE — Patient Instructions (Signed)
Hematuria, Adult Hematuria is blood in the urine. Blood may be visible in the urine, or it may be identified with a test. This condition can be caused by infections of the bladder, urethra, kidney, or prostate. Other possible causes include:  Kidney stones.  Cancer of the urinary tract.  Too much calcium in the urine.  Conditions that are passed from parent to child (inherited conditions).  Exercise that requires a lot of energy. Infections can usually be treated with medicine, and a kidney stone usually will pass through your urine. If neither of these is the cause of your hematuria, more tests may be needed to identify the cause of your symptoms. It is very important to tell your health care provider about any blood in your urine, even if it is painless or the blood stops without treatment. Blood in the urine, when it happens and then stops and then happens again, can be a symptom of a very serious condition, including cancer. There is no pain in the initial stages of many urinary cancers. Follow these instructions at home: Medicines  Take over-the-counter and prescription medicines only as told by your health care provider.  If you were prescribed an antibiotic medicine, take it as told by your health care provider. Do not stop taking the antibiotic even if you start to feel better. Eating and drinking  Drink enough fluid to keep your urine clear or pale yellow. It is recommended that you drink 3-4 quarts (2.8-3.8 L) a day. If you have been diagnosed with an infection, it is recommended that you drink cranberry juice in addition to large amounts of water.  Avoid caffeine, tea, and carbonated beverages. These tend to irritate the bladder.  Avoid alcohol because it may irritate the prostate (men). General instructions  If you have been diagnosed with a kidney stone, follow your health care provider's instructions about straining your urine to catch the stone.  Empty your bladder  often. Avoid holding urine for long periods of time.  If you are male: ? After a bowel movement, wipe from front to back and use each piece of toilet paper only once. ? Empty your bladder before and after sex.  Pay attention to any changes in your symptoms. Tell your health care provider about any changes or any new symptoms.  It is your responsibility to get your test results. Ask your health care provider, or the department performing the test, when your results will be ready.  Keep all follow-up visits as told by your health care provider. This is important. Contact a health care provider if:  You develop back pain.  You have a fever.  You have nausea or vomiting.  Your symptoms do not improve after 3 days.  Your symptoms get worse. Get help right away if:  You develop severe vomiting and are unable take medicine without vomiting.  You develop severe pain in your back or abdomen even though you are taking medicine.  You pass a large amount of blood in your urine.  You pass blood clots in your urine.  You feel very weak or like you might faint.  You faint. Summary  Hematuria is blood in the urine. It has many possible causes.  It is very important that you tell your health care provider about any blood in your urine, even if it is painless or the blood stops without treatment.  Take over-the-counter and prescription medicines only as told by your health care provider.  Drink enough fluid to keep   your urine clear or pale yellow. This information is not intended to replace advice given to you by your health care provider. Make sure you discuss any questions you have with your health care provider. Document Revised: 09/09/2018 Document Reviewed: 05/18/2016 Elsevier Patient Education  2020 Elsevier Inc.  

## 2019-12-17 NOTE — Progress Notes (Signed)
12/17/2019 8:59 AM   Jorge [name] 1966-04-26 [id number]  Referring provider: Jerrol Banana., MD [address] [place],  [place] [address]  No chief complaint on file.   HPI: F/u kidney stones. He passed a small stone in 2018. F/u May 2018 KUB revealed small bilateral calculi. A renal US was normal June 2018.   H/o stones s/p lithotripsy 2005 and 2013 with Dr. Olena Heckle and Dr. Jacqlyn Larsen.   He gets yearly PSA and DRE with PCP. PSA was 1.04 Mar 2016 and 1.7 in 2020.   He is on Xarelto for A Fib.  He returns today. He's had right flank pain past week on and off. He saw red urine last week with one void. No stone passage. He has some expired tramadol and ran out of tamsulosin. No fever. UA with 3-10 rbc today.  PMH: Past Medical History:  Diagnosis Date  . Atrial fibrillation ([place])    first diagnosed 2011; s/p DCCV at that time; ablation 10-2013 & 03/2014; failed multaq, flecainide and amiodarone  . Chronic anticoagulation    Xarelto  . Chronic kidney disease    H/O KIDNEY STONES  . Dysrhythmia   . Headache   . Hyperlipidemia   . Hypertension   . Sinusitis     Surgical History: Past Surgical History:  Procedure Laterality Date  . APPENDECTOMY  1975  . ATRIAL FIBRILLATION ABLATION N/A 11/02/2013   PVI by Dr Rayann Heman  . ATRIAL FIBRILLATION ABLATION N/A 03/29/2014   PVI and CTI by Dr Rayann Heman  . ATRIAL FIBRILLATION ABLATION N/A 07/04/2016   Procedure: Atrial Fibrillation Ablation;  Surgeon: Thompson Grayer, MD;  Location: [place] [facility];  Service: Cardiovascular;  Laterality: N/A;  . CARDIOVERSION     Hx of  . CARDIOVERSION N/A 10/15/2013   Procedure: CARDIOVERSION;  Surgeon: Fay Records, MD;  Location: [place];  Service: Cardiovascular;  Laterality: N/A;  . COLONOSCOPY  2016  . CORONARY STENT INTERVENTION N/A 12/23/2017   Procedure: CORONARY STENT INTERVENTION;  Surgeon: Burnell Blanks, MD;  Location: [place] [facility];  Service:  Cardiovascular;  Laterality: N/A;  . HERNIA REPAIR Left 05/28/2013   left inguinal henria repair, robotic direct defect repaired with Bard ultralight mesh.  Marland Kitchen HERNIA REPAIR Left 05/31/2015   Large Atrium pro loop mesh, direct defect.   . implantable loop recorder placement  09/08/2019   Medtronic Reveal Linq model LNQ 22 (SN [id number]) implantable loop recorder implanted in office by Dr Rayann Heman for afib management  . INGUINAL HERNIA REPAIR Left 05/31/2015   Procedure: HERNIA REPAIR INGUINAL ADULT;  Surgeon: Robert Bellow, MD;  Location: [facility];  Service: General;  Laterality: Left;  . KNEE SURGERY  2009   [facility]  . LITHOTRIPSY     x 3  last one  2013  . RHINOPLASTY  2006   Dr. Nadeen Landau, [facility]  . RIGHT/LEFT HEART CATH AND CORONARY ANGIOGRAPHY N/A 12/23/2017   Procedure: RIGHT/LEFT HEART CATH AND CORONARY ANGIOGRAPHY;  Surgeon: Burnell Blanks, MD;  Location: [place] [facility];  Service: Cardiovascular;  Laterality: N/A;  . TEE WITHOUT CARDIOVERSION N/A 11/01/2013   Procedure: TRANSESOPHAGEAL ECHOCARDIOGRAM (TEE);  Surgeon: Candee Furbish, MD;  Location: [facility];  Service: Cardiovascular;  Laterality: N/A;  . TEE WITHOUT CARDIOVERSION N/A 03/28/2014   Procedure: TRANSESOPHAGEAL ECHOCARDIOGRAM (TEE);  Surgeon: Larey Dresser, MD;  Location: [place];  Service: Cardiovascular;  Laterality: N/A;    Home Medications:  Allergies as of 12/17/2019      Reactions   Oxycodone Nausea And Vomiting   Vicodin [hydrocodone-acetaminophen] Nausea And Vomiting   Can take regular Tylenol      Medication List       Accurate as of December 17, 2019  8:59 AM. If you have any questions, ask your nurse or doctor.        aspirin 81 MG EC tablet Take 1 tablet by mouth daily.   azelastine 0.1 % nasal spray Commonly known as: ASTELIN Place 1 spray into both nostrils 2 (two) times daily.   diltiazem 240 MG 24 hr capsule Commonly known as: CARDIZEM CD Take 1  capsule (240 mg total) by mouth daily.   diltiazem 30 MG tablet Commonly known as: CARDIZEM TAKE ONE TO TWO TABLETS EVERY 6 HOURS AS NEEDED FOR PALPITATIONS.   dofetilide 500 MCG capsule Commonly known as: TIKOSYN Take 1 capsule (500 mcg total) by mouth 2 (two) times daily.   fluticasone 50 MCG/ACT nasal spray Commonly known as: FLONASE Place 2 sprays into both nostrils daily.   losartan 50 MG tablet Commonly known as: COZAAR Take 1 tablet (50 mg total) by mouth daily.   montelukast 10 MG tablet Commonly known as: SINGULAIR Take 1 tablet (10 mg total) by mouth at bedtime.   nitroGLYCERIN 0.4 MG SL tablet Commonly known as: Nitrostat Place 1 tablet (0.4 mg total) under the tongue every 5 (five) minutes as needed.   rosuvastatin 40 MG tablet Commonly known as: CRESTOR Take 1 tablet (40 mg total) by mouth daily.   tamsulosin 0.4 MG Caps capsule Commonly known as: FLOMAX Take 0.4 mg by mouth as needed (PROSTATE).   Xarelto 20 MG Tabs tablet Generic drug: rivaroxaban Take 1 tablet (20 mg total) by mouth daily with supper.       Allergies:  Allergies  Allergen Reactions  . Oxycodone Nausea And Vomiting  . Vicodin [Hydrocodone-Acetaminophen] Nausea And Vomiting    Can take regular Tylenol    Family History: Family History  Problem Relation Age of Onset  . Cancer Mother        breast, cause of death  . Heart attack Maternal Grandfather 53       Cause of death  . Hypertension Maternal Grandfather   . Heart failure Brother   . CVA Paternal Grandmother        cause of death  . Atrial fibrillation Maternal Grandmother   . Colon polyps Other        Fam Hx  . Kidney cancer Neg Hx   . Bladder Cancer Neg Hx   . Prostate cancer Neg Hx     Social History:  reports that he has never smoked. He has never used smokeless tobacco. He reports current alcohol use of about 2.0 standard drinks of alcohol per week. He reports that he does not use drugs.   Physical  Exam: There were no vitals taken for this visit.  Constitutional:  Alert and oriented, No acute distress. HEENT: [place] AT, moist mucus membranes.  Trachea midline, no masses. Cardiovascular: No clubbing, cyanosis, or edema. Respiratory: Normal respiratory effort, no increased work of breathing. GI: Abdomen is soft, nontender, nondistended, no abdominal masses GU: No CVA tenderness Skin: No rashes, bruises or suspicious lesions. Neurologic: Grossly intact, no focal deficits, moving all 4 extremities. Psychiatric: Normal mood and affect.  Laboratory Data: Lab Results  Component Value Date   WBC 8.8 04/06/2019   HGB 13.3 04/06/2019   HCT 37.9  04/06/2019   MCV 90 04/06/2019   PLT 245 04/06/2019    Lab Results  Component Value Date   CREATININE 0.94 09/08/2019    Lab Results  Component Value Date   PSA 1.3 03/27/2017    Lab Results  Component Value Date   TESTOSTERONE 711 03/27/2017    No results found for: HGBA1C  Urinalysis    Component Value Date/Time   APPEARANCEUR Clear 03/17/2017 1126   GLUCOSEU Negative 03/17/2017 1126   BILIRUBINUR Negative 04/06/2019 1055   BILIRUBINUR Negative 03/17/2017 1126   PROTEINUR Negative 04/06/2019 1055   PROTEINUR Negative 03/17/2017 1126   UROBILINOGEN 0.2 04/06/2019 1055   NITRITE Negative 04/06/2019 1055   NITRITE Negative 03/17/2017 1126   LEUKOCYTESUR Negative 04/06/2019 1055   LEUKOCYTESUR Negative 03/17/2017 1126    No results found for: LABMICR, WBCUA, RBCUA, LABEPIT, MUCUS, BACTERIA  Pertinent Imaging: n/a Results for orders placed during the hospital encounter of 09/18/16  Abdomen 1 view (KUB)  Narrative CLINICAL DATA:  Kidney stones.  Flank pain  EXAM: ABDOMEN - 1 VIEW  COMPARISON:  KUB 11/06/2011  FINDINGS: Moderate stool in the right colon overlying the right kidney. This obscures evaluation for renal calculi on the right. There is a probable small renal calculus on the right measuring 2 x 3  mm.  Small punctate renal calculi on the left unchanged from the prior study.  Vascular calcifications in the pelvis.  Normal bowel gas pattern.  No skeletal abnormality.  IMPRESSION: Small bilateral renal calculi, partially obscured by stool on the right.   Electronically Signed By: Franchot Gallo M.D. On: 09/18/2016 14:33  No results found for this or any previous visit.  No results found for this or any previous visit.  No results found for this or any previous visit.  Results for orders placed during the hospital encounter of 10/02/16  US Renal  Narrative CLINICAL DATA:  Kidney stones, history hypertension  EXAM: RENAL / URINARY TRACT ULTRASOUND COMPLETE  COMPARISON:  None ; correlation abdominal radiograph 09/18/2016  FINDINGS: Right Kidney:  Length: 10.9 cm. Normal cortical thickness and echogenicity. 5 mm echogenic focus at mid RIGHT kidney with questionable shadowing. Additional 4 mm nonshadowing echogenic focus at inferior pole RIGHT kidney, nonspecific. Small cyst at upper pole 12 x 9 x 12 mm. No additional mass or hydronephrosis. No perinephric fluid.  Left Kidney:  Length: 12.5 cm. Normal cortical thickness and echogenicity. Nonspecific 4 mm echogenic focus mid LEFT kidney without definite shadowing. No solid mass, hydronephrosis or perinephric fluid.  Bladder:  Normal appearance.  BILATERAL ureteral jets noted.  IMPRESSION: No evidence of worrisome renal mass or hydronephrosis.  Small cyst RIGHT kidney 12 mm diameter.  Echogenic foci in both kidneys without definite shadowing, questionably corresponding to tiny calculi seen on recent abdominal radiograph.   Electronically Signed By: Lavonia Dana M.D. On: 10/02/2016 17:35  No results found for this or any previous visit.  No results found for this or any previous visit.  No results found for this or any previous visit.   Assessment & Plan:    Kidney stone/flank pain - he may be  passing a stone.   Gross hematuria - likely from stone but may need cystoscopy. Check non-con CT.   No follow-ups on file.  Festus Aloe, MD  [facility] [address] [place], [place] [address] [phone]

## 2019-12-20 ENCOUNTER — Ambulatory Visit (INDEPENDENT_AMBULATORY_CARE_PROVIDER_SITE_OTHER): Payer: BC Managed Care – PPO | Admitting: *Deleted

## 2019-12-20 DIAGNOSIS — I480 Paroxysmal atrial fibrillation: Secondary | ICD-10-CM

## 2019-12-20 LAB — CUP PACEART REMOTE DEVICE CHECK
Date Time Interrogation Session: 20210823143612
Implantable Pulse Generator Implant Date: 20210512

## 2019-12-20 LAB — MICROSCOPIC EXAMINATION: Bacteria, UA: NONE SEEN

## 2019-12-20 LAB — URINALYSIS, COMPLETE
Bilirubin, UA: NEGATIVE
Glucose, UA: NEGATIVE
Ketones, UA: NEGATIVE
Leukocytes,UA: NEGATIVE
Nitrite, UA: NEGATIVE
Protein,UA: NEGATIVE
Specific Gravity, UA: 1.025 (ref 1.005–1.030)
Urobilinogen, Ur: 0.2 mg/dL (ref 0.2–1.0)
pH, UA: 6 (ref 5.0–7.5)

## 2019-12-23 ENCOUNTER — Other Ambulatory Visit: Payer: Self-pay

## 2019-12-23 ENCOUNTER — Ambulatory Visit
Admission: RE | Admit: 2019-12-23 | Discharge: 2019-12-23 | Disposition: A | Discharge status: Home / Self Care | Payer: BC Managed Care – PPO | Source: Ambulatory Visit | Attending: Urology | Admitting: Urology

## 2019-12-23 DIAGNOSIS — R1011 Right upper quadrant pain: Secondary | ICD-10-CM | POA: Insufficient documentation

## 2019-12-23 NOTE — Progress Notes (Signed)
Carelink Summary Report / Loop Recorder 

## 2019-12-31 ENCOUNTER — Other Ambulatory Visit (HOSPITAL_COMMUNITY)
Admission: RE | Admit: 2019-12-31 | Discharge: 2019-12-31 | Disposition: A | Discharge status: Home / Self Care | Payer: BC Managed Care – PPO | Source: Ambulatory Visit | Attending: Orthopedic Surgery | Admitting: Orthopedic Surgery

## 2019-12-31 ENCOUNTER — Other Ambulatory Visit: Payer: Self-pay

## 2019-12-31 ENCOUNTER — Encounter (HOSPITAL_BASED_OUTPATIENT_CLINIC_OR_DEPARTMENT_OTHER): Payer: Self-pay | Admitting: Orthopedic Surgery

## 2019-12-31 ENCOUNTER — Ambulatory Visit: Payer: Self-pay | Admitting: Urology

## 2019-12-31 DIAGNOSIS — Z20822 Contact with and (suspected) exposure to covid-19: Secondary | ICD-10-CM | POA: Insufficient documentation

## 2019-12-31 DIAGNOSIS — Z01818 Encounter for other preprocedural examination: Secondary | ICD-10-CM | POA: Diagnosis not present

## 2019-12-31 LAB — SARS CORONAVIRUS 2 (TAT 6-24 HRS): SARS Coronavirus 2: NEGATIVE

## 2019-12-31 NOTE — Progress Notes (Addendum)
Spoke w/ via phone for pre-op interview--- PT Lab needs dos---- Istat               Lab results------ current ekg in epic/ chart COVID test ------ 12-31-2019 @ 1035  Arrive at ------- 1030 NPO after MN NO Solid Food.  Clear liquids from MN until--- 0930 Medications to take morning of surgery ----- Tikosyn Diabetic medication ----- n/a  Patient Special Instructions ----- asked to bring cpap/ mask / tubing dos with him  Pre-Op special Istructions ----- pt has cardiac clearance with last office visit with dr Rayann Heman 12-16-2019 in epic (copy in chart).  Per pt was by Dr Rayann Heman if any issues to call him on his cell [phone].  (made sticker note to put on front of chart for anesthesia, and put in specials needs of case)  Patient verbalized understanding of instructions that were given at this phone interview. Patient denies shortness of breath, chest pain, fever, cough at this phone interview.   Anesthesia Review: hx PAF s/p DCCV's , Ablation's x3 last one 2018;  HTN;  OSA w/ cpap (stated uses every night).  Pt denies any cardiac issues out of the normal and no chest pain.  Pt stated he gets sob when in afib , otherwise no sob with activities.    PCP:  Dr Thurston Hole  Cardiologist : primary Dr Angelena Form (lov 02-18-2019 epic);  EP Dr Rayann Heman Cassell Clement 12-16-2019 with clearance for asa/ xarleto, in epic) Chest x-ray :  >60yrs in epic Cardiac CT:  07-02-2016 epic EKG : 12-16-2019 epic Echo : 05-12-2019 epic Stress test:  ETT 07-13-2019 Cardiac Cath :  12-23-2017 epic Activity level:  See above  Sleep Study/ CPAP : YES/ YES Fasting Blood Sugar :      / Checks Blood Sugar -- times a day:   N/A  Blood Thinner/ Instructions Maryjane Hurter Dose: Xarelto ASA / Instructions/ Last Dose :  ASA 81mg  Per pt was given instructions from Dr Rayann Heman to stop asa 5 days prior to surgery and xarelto 24 hours ,  Last asa dose 12-30-2019 and stated his last dose xarelto will be 01-02-2020.

## 2020-01-04 ENCOUNTER — Ambulatory Visit (HOSPITAL_BASED_OUTPATIENT_CLINIC_OR_DEPARTMENT_OTHER)
Admission: RE | Admit: 2020-01-04 | Discharge: 2020-01-04 | Disposition: A | Discharge status: Home / Self Care | Payer: BC Managed Care – PPO | Attending: Orthopedic Surgery | Admitting: Orthopedic Surgery

## 2020-01-04 ENCOUNTER — Encounter (HOSPITAL_BASED_OUTPATIENT_CLINIC_OR_DEPARTMENT_OTHER): Payer: Self-pay | Admitting: Orthopedic Surgery

## 2020-01-04 ENCOUNTER — Encounter (HOSPITAL_BASED_OUTPATIENT_CLINIC_OR_DEPARTMENT_OTHER)
Admission: RE | Disposition: A | Discharge status: Home / Self Care | Payer: Self-pay | Source: Home / Self Care | Attending: Orthopedic Surgery

## 2020-01-04 ENCOUNTER — Ambulatory Visit (HOSPITAL_BASED_OUTPATIENT_CLINIC_OR_DEPARTMENT_OTHER): Payer: BC Managed Care – PPO | Admitting: Anesthesiology

## 2020-01-04 DIAGNOSIS — I480 Paroxysmal atrial fibrillation: Secondary | ICD-10-CM | POA: Insufficient documentation

## 2020-01-04 DIAGNOSIS — M170 Bilateral primary osteoarthritis of knee: Secondary | ICD-10-CM | POA: Insufficient documentation

## 2020-01-04 DIAGNOSIS — Z955 Presence of coronary angioplasty implant and graft: Secondary | ICD-10-CM | POA: Insufficient documentation

## 2020-01-04 DIAGNOSIS — I10 Essential (primary) hypertension: Secondary | ICD-10-CM | POA: Insufficient documentation

## 2020-01-04 DIAGNOSIS — Z885 Allergy status to narcotic agent status: Secondary | ICD-10-CM | POA: Diagnosis not present

## 2020-01-04 DIAGNOSIS — I2510 Atherosclerotic heart disease of native coronary artery without angina pectoris: Secondary | ICD-10-CM | POA: Diagnosis not present

## 2020-01-04 DIAGNOSIS — Z79899 Other long term (current) drug therapy: Secondary | ICD-10-CM | POA: Diagnosis not present

## 2020-01-04 DIAGNOSIS — E785 Hyperlipidemia, unspecified: Secondary | ICD-10-CM | POA: Diagnosis not present

## 2020-01-04 DIAGNOSIS — X58XXXA Exposure to other specified factors, initial encounter: Secondary | ICD-10-CM | POA: Diagnosis not present

## 2020-01-04 DIAGNOSIS — S83241A Other tear of medial meniscus, current injury, right knee, initial encounter: Secondary | ICD-10-CM | POA: Insufficient documentation

## 2020-01-04 DIAGNOSIS — Z8249 Family history of ischemic heart disease and other diseases of the circulatory system: Secondary | ICD-10-CM | POA: Diagnosis not present

## 2020-01-04 DIAGNOSIS — Z7901 Long term (current) use of anticoagulants: Secondary | ICD-10-CM | POA: Diagnosis not present

## 2020-01-04 DIAGNOSIS — M6751 Plica syndrome, right knee: Secondary | ICD-10-CM | POA: Diagnosis not present

## 2020-01-04 DIAGNOSIS — Z7982 Long term (current) use of aspirin: Secondary | ICD-10-CM | POA: Diagnosis not present

## 2020-01-04 DIAGNOSIS — M659 Synovitis and tenosynovitis, unspecified: Secondary | ICD-10-CM | POA: Diagnosis not present

## 2020-01-04 DIAGNOSIS — G4733 Obstructive sleep apnea (adult) (pediatric): Secondary | ICD-10-CM | POA: Insufficient documentation

## 2020-01-04 HISTORY — DX: Other seasonal allergic rhinitis: J30.2

## 2020-01-04 HISTORY — DX: Calculus of kidney: N20.0

## 2020-01-04 HISTORY — DX: Obstructive sleep apnea (adult) (pediatric): G47.33

## 2020-01-04 HISTORY — PX: KNEE ARTHROSCOPY WITH MEDIAL MENISECTOMY: SHX5651

## 2020-01-04 HISTORY — DX: Personal history of urinary calculi: Z87.442

## 2020-01-04 HISTORY — DX: Long term (current) use of anticoagulants: Z79.01

## 2020-01-04 HISTORY — DX: Shortness of breath: R06.02

## 2020-01-04 HISTORY — DX: Atherosclerotic heart disease of native coronary artery without angina pectoris: I25.10

## 2020-01-04 HISTORY — DX: Unspecified osteoarthritis, unspecified site: M19.90

## 2020-01-04 HISTORY — DX: Unspecified tear of unspecified meniscus, current injury, right knee, initial encounter: S83.206A

## 2020-01-04 HISTORY — DX: Personal history of other diseases of the circulatory system: Z86.79

## 2020-01-04 LAB — POCT I-STAT, CHEM 8
BUN: 16 mg/dL (ref 6–20)
Calcium, Ion: 1.29 mmol/L (ref 1.15–1.40)
Chloride: 103 mmol/L (ref 98–111)
Creatinine, Ser: 0.7 mg/dL (ref 0.61–1.24)
Glucose, Bld: 101 mg/dL — ABNORMAL HIGH (ref 70–99)
HCT: 37 % — ABNORMAL LOW (ref 39.0–52.0)
Hemoglobin: 12.6 g/dL — ABNORMAL LOW (ref 13.0–17.0)
Potassium: 4.2 mmol/L (ref 3.5–5.1)
Sodium: 141 mmol/L (ref 135–145)
TCO2: 26 mmol/L (ref 22–32)

## 2020-01-04 SURGERY — ARTHROSCOPY, KNEE, WITH MEDIAL MENISCECTOMY
Anesthesia: General | Site: Knee | Laterality: Right

## 2020-01-04 MED ORDER — BUPIVACAINE-EPINEPHRINE 0.25% -1:200000 IJ SOLN
INTRAMUSCULAR | Status: DC | PRN
  Administered 2020-01-04: 20 mL

## 2020-01-04 MED ORDER — FENTANYL CITRATE (PF) 100 MCG/2ML IJ SOLN
INTRAMUSCULAR | Status: AC
  Filled 2020-01-04: qty 2

## 2020-01-04 MED ORDER — SODIUM CHLORIDE 0.9 % IR SOLN
Status: DC | PRN
  Administered 2020-01-04: 150 mL
  Administered 2020-01-04: 3000 mL

## 2020-01-04 MED ORDER — HYDROCODONE-ACETAMINOPHEN 5-325 MG PO TABS: 1.0000 | ORAL_TABLET | Freq: Four times a day (QID) | ORAL | 0 refills | Status: DC | PRN

## 2020-01-04 MED ORDER — PROPOFOL 10 MG/ML IV BOLUS
INTRAVENOUS | Status: AC
  Filled 2020-01-04: qty 20

## 2020-01-04 MED ORDER — FENTANYL CITRATE (PF) 100 MCG/2ML IJ SOLN
INTRAMUSCULAR | Status: AC
Start: 2020-01-04 — End: ?
  Filled 2020-01-04: qty 2

## 2020-01-04 MED ORDER — FENTANYL CITRATE (PF) 100 MCG/2ML IJ SOLN
25.0000 ug | INTRAMUSCULAR | Status: DC | PRN
  Administered 2020-01-04: 25 ug via INTRAVENOUS

## 2020-01-04 MED ORDER — CEFAZOLIN SODIUM-DEXTROSE 2-4 GM/100ML-% IV SOLN
2.0000 g | INTRAVENOUS | Status: AC
  Administered 2020-01-04: 2 g via INTRAVENOUS

## 2020-01-04 MED ORDER — FENTANYL CITRATE (PF) 100 MCG/2ML IJ SOLN
INTRAMUSCULAR | Status: DC | PRN
Start: 2020-01-04 — End: 2020-01-04
  Administered 2020-01-04: 50 ug via INTRAVENOUS
  Administered 2020-01-04: 100 ug via INTRAVENOUS
  Administered 2020-01-04: 50 ug via INTRAVENOUS

## 2020-01-04 MED ORDER — DEXAMETHASONE SODIUM PHOSPHATE 10 MG/ML IJ SOLN
INTRAMUSCULAR | Status: DC | PRN
  Administered 2020-01-04: 10 mg via INTRAVENOUS

## 2020-01-04 MED ORDER — MIDAZOLAM HCL 2 MG/2ML IJ SOLN
INTRAMUSCULAR | Status: AC
  Filled 2020-01-04: qty 2

## 2020-01-04 MED ORDER — MIDAZOLAM HCL 5 MG/5ML IJ SOLN
INTRAMUSCULAR | Status: DC | PRN
  Administered 2020-01-04: 2 mg via INTRAVENOUS

## 2020-01-04 MED ORDER — KETOROLAC TROMETHAMINE 30 MG/ML IJ SOLN
INTRAMUSCULAR | Status: AC
  Filled 2020-01-04: qty 1

## 2020-01-04 MED ORDER — LIDOCAINE 2% (20 MG/ML) 5 ML SYRINGE
INTRAMUSCULAR | Status: DC | PRN
  Administered 2020-01-04: 60 mg via INTRAVENOUS

## 2020-01-04 MED ORDER — ACETAMINOPHEN 500 MG PO TABS
ORAL_TABLET | ORAL | Status: AC
  Filled 2020-01-04: qty 2

## 2020-01-04 MED ORDER — LIDOCAINE 2% (20 MG/ML) 5 ML SYRINGE
INTRAMUSCULAR | Status: AC
  Filled 2020-01-04: qty 5

## 2020-01-04 MED ORDER — PROPOFOL 10 MG/ML IV BOLUS
INTRAVENOUS | Status: DC | PRN
  Administered 2020-01-04: 200 mg via INTRAVENOUS

## 2020-01-04 MED ORDER — ONDANSETRON 4 MG PO TBDP: 4.0000 mg | ORAL_TABLET | Freq: Three times a day (TID) | ORAL | 0 refills | Status: DC | PRN

## 2020-01-04 MED ORDER — DEXAMETHASONE SODIUM PHOSPHATE 10 MG/ML IJ SOLN
INTRAMUSCULAR | Status: AC
  Filled 2020-01-04: qty 1

## 2020-01-04 MED ORDER — KETOROLAC TROMETHAMINE 30 MG/ML IJ SOLN
INTRAMUSCULAR | Status: DC | PRN
  Administered 2020-01-04: 30 mg via INTRAVENOUS

## 2020-01-04 MED ORDER — ACETAMINOPHEN 500 MG PO TABS
1000.0000 mg | ORAL_TABLET | Freq: Once | ORAL | Status: AC
  Administered 2020-01-04: 1000 mg via ORAL

## 2020-01-04 MED ORDER — ONDANSETRON HCL 4 MG/2ML IJ SOLN
INTRAMUSCULAR | Status: DC | PRN
  Administered 2020-01-04: 4 mg via INTRAVENOUS

## 2020-01-04 MED ORDER — ONDANSETRON HCL 4 MG/2ML IJ SOLN
INTRAMUSCULAR | Status: AC
  Filled 2020-01-04: qty 2

## 2020-01-04 MED ORDER — LACTATED RINGERS IV SOLN: INTRAVENOUS | Status: DC

## 2020-01-04 MED ORDER — CEFAZOLIN SODIUM-DEXTROSE 2-4 GM/100ML-% IV SOLN
INTRAVENOUS | Status: AC
  Filled 2020-01-04: qty 100

## 2020-01-04 SURGICAL SUPPLY — 38 items
ABLATOR ASPIRATE 50D MULTI-PRT (SURGICAL WAND) IMPLANT
BANDAGE ESMARK 6X9 LF (GAUZE/BANDAGES/DRESSINGS) IMPLANT
BLADE SHAVER TORPEDO 4X13 (MISCELLANEOUS) ×2 IMPLANT
BNDG CMPR 9X6 STRL LF SNTH (GAUZE/BANDAGES/DRESSINGS)
BNDG COHESIVE 4X5 TAN STRL (GAUZE/BANDAGES/DRESSINGS) ×2 IMPLANT
BNDG ELASTIC 6X5.8 VLCR STR LF (GAUZE/BANDAGES/DRESSINGS) ×2 IMPLANT
BNDG ESMARK 6X9 LF (GAUZE/BANDAGES/DRESSINGS)
COVER WAND RF STERILE (DRAPES) ×2 IMPLANT
CUFF TOURN SGL QUICK 34 (TOURNIQUET CUFF) ×2
CUFF TRNQT CYL 34X4.125X (TOURNIQUET CUFF) ×1 IMPLANT
DRAPE ARTHROSCOPY W/POUCH 114 (DRAPES) ×2 IMPLANT
DRAPE U-SHAPE 47X51 STRL (DRAPES) ×2 IMPLANT
DRSG PAD ABDOMINAL 8X10 ST (GAUZE/BANDAGES/DRESSINGS) ×2 IMPLANT
DURAPREP 26ML APPLICATOR (WOUND CARE) ×4 IMPLANT
GAUZE SPONGE 4X4 12PLY STRL (GAUZE/BANDAGES/DRESSINGS) ×2 IMPLANT
GAUZE SPONGE 4X4 12PLY STRL LF (GAUZE/BANDAGES/DRESSINGS) ×2 IMPLANT
GAUZE XEROFORM 1X8 LF (GAUZE/BANDAGES/DRESSINGS) ×2 IMPLANT
GLOVE BIO SURGEON STRL SZ7.5 (GLOVE) ×2 IMPLANT
GLOVE BIOGEL PI IND STRL 8 (GLOVE) ×1 IMPLANT
GLOVE BIOGEL PI INDICATOR 8 (GLOVE) ×1
GOWN STRL REUS W/TWL XL LVL3 (GOWN DISPOSABLE) ×4 IMPLANT
IV NS IRRIG 3000ML ARTHROMATIC (IV SOLUTION) ×4 IMPLANT
KIT TURNOVER CYSTO (KITS) ×2 IMPLANT
MANIFOLD NEPTUNE II (INSTRUMENTS) ×2 IMPLANT
PACK ARTHROSCOPY DSU (CUSTOM PROCEDURE TRAY) ×2 IMPLANT
PACK BASIN DAY SURGERY FS (CUSTOM PROCEDURE TRAY) ×2 IMPLANT
PAD ARMBOARD 7.5X6 YLW CONV (MISCELLANEOUS) IMPLANT
PAD CAST 4YDX4 CTTN HI CHSV (CAST SUPPLIES) ×1 IMPLANT
PADDING CAST COTTON 4X4 STRL (CAST SUPPLIES) ×2
PORT APPOLLO RF 90DEGREE MULTI (SURGICAL WAND) IMPLANT
STOCKING KNEE MED LNG (STOCKING) ×2 IMPLANT
SUT ETHILON 4 0 PS 2 18 (SUTURE) ×4 IMPLANT
SYR CONTROL 10ML LL (SYRINGE) ×2 IMPLANT
TOWEL OR 17X26 10 PK STRL BLUE (TOWEL DISPOSABLE) ×2 IMPLANT
TUBE CONNECTING 12X1/4 (SUCTIONS) ×4 IMPLANT
TUBING ARTHROSCOPY IRRIG 16FT (MISCELLANEOUS) ×2 IMPLANT
WATER STERILE IRR 500ML POUR (IV SOLUTION) IMPLANT
WRAP KNEE MAXI GEL POST OP (GAUZE/BANDAGES/DRESSINGS) ×2 IMPLANT

## 2020-01-04 NOTE — Discharge Instructions (Signed)
-Maintain postoperative bandage on the right knee for 2 days.  You may remove this bandage on Thursday.  At that time you may begin showering.  You should then cover your incisions with Band-Aids.  Do not submerge underwater until follow-up appointment.  -You are okay for full weightbearing as tolerated to the operative extremity.  You may range the knee as tolerated as well.  -For the prevention of blood clots you should resume your preoperative Xarelto as well as aspirin starting on postoperative day #1.  -Apply ice to the right knee for 20 to 30 minutes out of each hour that you are awake.  -For mild to moderate pain use Tylenol and Advil around-the-clock.  You should alternate these every 3 hours.  Otherwise for breakthrough pain use the hydrocodone as necessary.    Knee Arthroscopy, Care After This sheet gives you information about how to care for yourself after your procedure. Your health care provider may also give you more specific instructions. If you have problems or questions, contact your health care provider. What can I expect after the procedure? After the procedure, it is common to have:  Soreness.  Swelling.  Pain that can be relieved by taking pain medicine. Follow these instructions at home: Incision care   Follow instructions from your health care provider about how to take care of your incisions. Make sure you: ? Wash your hands with soap and water before you change your bandage (dressing). If soap and water are not available, use hand sanitizer. ? Change your dressing as told by your health care provider. ? Leave stitches (sutures), staples, skin glue, or adhesive strips in place. These skin closures may need to stay in place for 2 weeks or longer. If adhesive strip edges start to loosen and curl up, you may trim the loose edges. Do not remove adhesive strips completely unless your health care provider tells you to do that.  Check your incision areas every day for  signs of infection. Check for: ? Redness. ? More swelling or pain. ? Fluid or blood. ? Warmth. ? Pus or a bad smell. Bathing  Do not take baths, swim, or use a hot tub until your health care provider approves. Ask your health care provider if you may take showers. You may only be allowed to take sponge baths. Activity  Do not use your knee to support your body weight until your health care provider says that you can. Follow weight-bearing restrictions as told. Use crutches or other devices to help you move around (assistive devices) as directed.  Ask your health care provider what activities are safe for you during recovery, and what activities you need to avoid.  If physical therapy was prescribed, do exercises as directed. Doing exercises may help improve knee movement and flexibility (range of motion).  Do not lift anything that is heavier than 10 lb (4.5 kg), or the limit that you are told, until your health care provider says that it is safe. Driving  Do not drive until your health care provider approves. You may be able to drive after 1-3 weeks.  Do not drive or use heavy machinery while taking prescription pain medicine. Managing pain, stiffness, and swelling   If directed, put ice on the injured area: ? Put ice in a plastic bag or use the icing device (cold therapy unit) that you were given. Follow instructions from your health care provider about how to use the icing device. ? Place a towel between your skin  and the bag or between your skin and the icing device. ? Leave the ice on for 20 minutes, 2-3 times a day.  Move your toes often to avoid stiffness and to lessen swelling.  Raise (elevate) the injured area above the level of your heart while you are sitting or lying down. If you are taking blood thinners:  Before you take any medicines that contain aspirin or NSAIDs, talk with your health care provider. These medicines increase your risk for dangerous  bleeding.  Take your medicine exactly as told, at the same time every day.  Avoid activities that could cause injury or bruising, and follow instructions about how to prevent falls.  Wear a medical alert bracelet or carry a card that lists what medicines you take. General instructions  Take over-the-counter and prescription medicines only as told by your health care provider.  If you are taking prescription pain medicine, take actions to prevent or treat constipation. Your health care provider may recommend that you: ? Drink enough fluid to keep your urine pale yellow. ? Eat foods that are high in fiber, such as fresh fruits and vegetables, whole grains, and beans. ? Limit foods that are high in fat and processed sugars, such as fried or sweet foods. ? Take an over-the-counter or prescription medicines for constipation.  Do not use any products that contain nicotine or tobacco, such as cigarettes and e-cigarettes. These can delay incision or bone healing. If you need help quitting, ask your health care provider.  Wear compression stockings as told by your health care provider. These stockings help to prevent blood clots and reduce swelling in your legs.  Keep all follow-up visits as told by your health care provider. This is important. Contact a health care provider if you:  Have a fever.  Have severe pain.  Have redness around an incision.  Have more swelling.  Have fluid or blood coming from an incision.  Notice that an incision feels warm to the touch.  Notice pus or a bad smell coming from an incision.  Notice that an incision opens up.  Develop a rash. Get help right away if you:  Have difficulty breathing.  Have shortness of breath.  Have chest pain.  Develop pain in your lower leg or at the back of your knee.  Have numbness or tingling in your lower leg or your foot. Summary  Raise (elevate) the injured area above the level of your heart while you are  sitting or lying down.  To help relieve pain and swelling, put ice on your leg for 20 minutes at a time, 2-3 times a day.  If you were prescribed a blood thinner, avoid activities that could cause injury or bruising, and follow instructions about how to prevent falls.  If physical therapy was prescribed, do exercises as directed. Doing exercises may help improve range of motion. This information is not intended to replace advice given to you by your health care provider. Make sure you discuss any questions you have with your health care provider. Document Revised: 03/28/2017 Document Reviewed: 02/26/2017 Elsevier Patient Education  [place] Instructions  Activity: Get plenty of rest for the remainder of the day. A responsible individual must stay with you for 24 hours following the procedure.  For the next 24 hours, DO NOT: -Drive a car -Paediatric nurse -Drink alcoholic beverages -Take any medication unless instructed by your physician -Make any legal decisions or sign important papers.  Meals: Start with liquid foods such as gelatin or soup. Progress to regular foods as tolerated. Avoid greasy, spicy, heavy foods. If nausea and/or vomiting occur, drink only clear liquids until the nausea and/or vomiting subsides. Call your physician if vomiting continues.  Special Instructions/Symptoms: Your throat may feel dry or sore from the anesthesia or the breathing tube placed in your throat during surgery. If this causes discomfort, gargle with warm salt water. The discomfort should disappear within 24 hours.   NO TYLENOL UNTIL 5:00 PM THIS EVENING.  NO ADVIL, MOTRIN, OR IBUPROFEN UNTIL 9:00 PM THIS EVENING.

## 2020-01-04 NOTE — Transfer of Care (Signed)
Immediate Anesthesia Transfer of Care Note  Patient: Jorge [name]  Procedure(s) Performed: KNEE ARTHROSCOPY WITH PARTIAL MEDIAL MENISECTOMY (Right Knee)  Patient Location: PACU  Anesthesia Type:General  Level of Consciousness: awake and alert   Airway & Oxygen Therapy: Patient Spontanous Breathing and Patient connected to face mask oxygen  Post-op Assessment: Report given to RN and Post -op Vital signs reviewed and stable  Post vital signs: Reviewed and stable  Last Vitals:  Vitals Value Taken Time  BP    Temp    Pulse 79 01/04/20 1334  Resp 12 01/04/20 1334  SpO2 95 % 01/04/20 1334  Vitals shown include unvalidated device data.  Last Pain:  Vitals:   01/04/20 1047  TempSrc: Oral  PainSc: 3       Patients Stated Pain Goal: 5 (86/38/17 7116)  Complications: No complications documented.

## 2020-01-04 NOTE — Brief Op Note (Signed)
01/04/2020  1:23 PM  PATIENT:  [name]  54 y.o. male  PRE-OPERATIVE DIAGNOSIS:  Right knee medial meniscus tear  POST-OPERATIVE DIAGNOSIS:  Right knee medial meniscus tear  PROCEDURE:  Procedure(s): KNEE ARTHROSCOPY WITH PARTIAL MEDIAL MENISECTOMY (Right)  SURGEON:  Surgeon(s) and Role:    * Nicholes Stairs, MD - Primary  PHYSICIAN ASSISTANT:  Jonelle Sidle, PA-C.   ANESTHESIA:   local and general  EBL:  10 cc  BLOOD ADMINISTERED:none  DRAINS: none   LOCAL MEDICATIONS USED:  MARCAINE     SPECIMEN:  No Specimen  DISPOSITION OF SPECIMEN:  N/A  COUNTS:  YES  TOURNIQUET:  * Missing tourniquet times found for documented tourniquets in log: [id number] *  DICTATION: .Note written in EPIC  PLAN OF CARE: Discharge to home after PACU  PATIENT DISPOSITION:  PACU - hemodynamically stable.   Delay start of Pharmacological VTE agent (>24hrs) due to surgical blood loss or risk of bleeding: not applicable

## 2020-01-04 NOTE — Anesthesia Procedure Notes (Signed)
Procedure Name: LMA Insertion Date/Time: 01/04/2020 12:34 PM Performed by: Lieutenant Diego, CRNA Pre-anesthesia Checklist: Patient identified, Emergency Drugs available, Suction available and Patient being monitored Patient Re-evaluated:Patient Re-evaluated prior to induction Oxygen Delivery Method: Circle system utilized Preoxygenation: Pre-oxygenation with 100% oxygen Induction Type: IV induction Ventilation: Mask ventilation without difficulty LMA: LMA inserted LMA Size: 5.0 Number of attempts: 1 Placement Confirmation: positive ETCO2 and breath sounds checked- equal and bilateral Tube secured with: Tape Dental Injury: Teeth and Oropharynx as per pre-operative assessment

## 2020-01-04 NOTE — Anesthesia Postprocedure Evaluation (Signed)
Anesthesia Post Note  Patient: Jorge [name]  Procedure(s) Performed: KNEE ARTHROSCOPY WITH PARTIAL MEDIAL MENISECTOMY (Right Knee)     Patient location during evaluation: PACU Anesthesia Type: General Level of consciousness: awake and alert Pain management: pain level controlled Vital Signs Assessment: post-procedure vital signs reviewed and stable Respiratory status: spontaneous breathing, nonlabored ventilation, respiratory function stable and patient connected to nasal cannula oxygen Cardiovascular status: blood pressure returned to baseline and stable Postop Assessment: no apparent nausea or vomiting Anesthetic complications: no   No complications documented.  Last Vitals:  Vitals:   01/04/20 1343 01/04/20 1345  BP:  123/81  Pulse:  70  Resp:  16  Temp:    SpO2: 96% 95%    Last Pain:  Vitals:   01/04/20 1332  TempSrc:   PainSc: 0-No pain                 Adrieana Fennelly L Draeden Kellman

## 2020-01-04 NOTE — Anesthesia Preprocedure Evaluation (Addendum)
Anesthesia Evaluation  Patient identified by MRN, date of birth, ID band Patient awake    Reviewed: Allergy & Precautions, NPO status , Patient's Chart, lab work & pertinent test results  Airway Mallampati: I  TM Distance: >3 FB Neck ROM: Full    Dental no notable dental hx. (+) Teeth Intact   Pulmonary sleep apnea and Continuous Positive Airway Pressure Ventilation ,    Pulmonary exam normal breath sounds clear to auscultation       Cardiovascular hypertension, Pt. on medications + angina + CAD and + Cardiac Stents (2019)  Normal cardiovascular exam+ dysrhythmias (s/p ablation x3, most recently 2018) Atrial Fibrillation  Rhythm:Regular Rate:Normal  HLD  TTE 2021 EF normal, moderate LVH, mild AI  Stress Test 2021 Blood pressure demonstrated a normal response to exercise. There was no ST segment deviation noted during stress. Excellent exercise capacity. Patient was in atrial fibrillation at rest and became more organized, atrial flutter with exercise. Negative adequate stress test.      Neuro/Psych negative neurological ROS  negative psych ROS   GI/Hepatic negative GI ROS, Neg liver ROS,   Endo/Other  negative endocrine ROS  Renal/GU negative Renal ROS  negative genitourinary   Musculoskeletal  (+) Arthritis , Osteoarthritis,    Abdominal   Peds  Hematology  (+) Blood dyscrasia (on xarelto), ,   Anesthesia Other Findings   Reproductive/Obstetrics                            Anesthesia Physical Anesthesia Plan  ASA: III  Anesthesia Plan: General   Post-op Pain Management:    Induction: Intravenous  PONV Risk Score and Plan: 2 and Ondansetron, Dexamethasone and Midazolam  Airway Management Planned: LMA  Additional Equipment:   Intra-op Plan:   Post-operative Plan: Extubation in OR  Informed Consent: I have reviewed the patients History and Physical, chart, labs and  discussed the procedure including the risks, benefits and alternatives for the proposed anesthesia with the patient or authorized representative who has indicated his/her understanding and acceptance.     Dental advisory given  Plan Discussed with: CRNA  Anesthesia Plan Comments:         Anesthesia Quick Evaluation

## 2020-01-04 NOTE — Op Note (Signed)
Surgery Date: 01/04/2020  Surgeon(s): Nicholes Stairs, MD  ANESTHESIA:  general  FLUIDS: Per anesthesia record.   Assistant: Jonelle Sidle PA_C.  Assistant attestation:  PA Mcclung was utilized throughout the procedure for critical portions including prepping and draping, positioning the extremity, and closure of the wound.  ESTIMATED BLOOD LOSS: minimal  PREOPERATIVE DIAGNOSES:  1. Right  knee medial meniscus tear 2.  Right knee synovitis  POSTOPERATIVE DIAGNOSES:  1. Right  knee medial meniscus tear 2.  Right knee synovitis 3. Right knee medial shelf plica  PROCEDURES PERFORMED:  1. Right knee arthroscopy with limited synovectomy (medial shelf plica resection) 2. knee arthroscopy with arthroscopic partial medial meniscectomy 3. knee arthroscopy with arthroscopic chondroplasty  medial tibial plateau. Grade II   DESCRIPTION OF PROCEDURE: Jorge [name] is a 54 y.o.-year-old male with right knee complex medial meniscus tear. Plans are to proceed with partial medial meniscectomy and diagnostic arthroscopy with debridement as indicated. Full discussion held regarding risks benefits alternatives and complications related surgical intervention. Conservative care options reviewed. All questions answered.  The patient was identified in the preoperative holding area and the operative extremity was marked. The patient was brought to the operating room and transferred to operating table in a supine position. Satisfactory general anesthesia was induced by anesthesiology.    Standard anterolateral, anteromedial arthroscopy portals were obtained. The anteromedial portal was obtained with a spinal needle for localization under direct visualization with subsequent diagnostic findings.   Anteromedial and anterolateral chambers: mild synovitis. The synovitis was debrided with a 4.5 mm full radius shaver through both the anteromedial and lateral portals.   Suprapatellar pouch and gutters: no  synovitis or debris.  There was however, noted to be a significant medial shelf plica coming off of the medial retinaculum and interposed between the patella and medial aspect of the medial femoral condyle. Patella chondral surface: Grade 0 Trochlear chondral surface: Grade 0 Patellofemoral tracking: Midline with no tilt Medial meniscus: Complex tear of the mid body with a large undersurface flap tear that was incarcerated between the medial tibia and medial collateral ligament.  There was propagation of the tear posteriorly in a horizontal fashion..  Medial femoral condyle flexion bearing surface: Grade 0 Medial femoral condyle extension bearing surface: Grade 0 Medial tibial plateau: Grade 2 chondromalacia on the central weightbearing portion Anterior cruciate ligament:stable Posterior cruciate ligament:stable Lateral meniscus: Intact.   Lateral femoral condyle flexion bearing surface: Grade 0 Lateral femoral condyle extension bearing surface: Grade 0 Lateral tibial plateau: Grade 0  We began the procedure following the diagnostic arthroscopy with partial medial meniscectomy.  Utilizing a probe we delivered the incarcerated flap tear fragment from the medial gutter.  We then utilized combination of motorized shaver as well as meniscal biter to resect the torn tissue.  We also debrided the undersurface tear of the posterior horn with a combination of biter and motorized shaver.  Following completion of the meniscectomy we had resected approximately 25% of the medial meniscus tissue.    We then performed a chondroplasty of the medial tibial plateau with a debridement technique utilizing motorized shaver.  We continued this until stable cartilage was remaining.  Lastly, we performed medial shelf plica resection.  This limited synovectomy was carried out with accommodation of basket biters to provide unstable edges so that the shaver would be more successful in debriding.  We then reviewed from the  medial portal and worked from the lateral portal across the trochlea to resect to the medial shelf plica.  After completion of synovectomy, diagnostic exam, and debridements as described, all compartments were checked and no residual debris remained. Hemostasis was achieved with the cautery wand. The portals were approximated with buried monocryl. All excess fluid was expressed from the joint.  Xeroform sterile gauze dressings were applied followed by Ace bandage and ice pack.   DISPOSITION: The patient was awakened from general anesthetic, extubated, taken to the recovery room in medically stable condition, no apparent complications. The patient may be weightbearing as tolerated to the operative lower extremity.  Range of motion of right knee as tolerated.

## 2020-01-04 NOTE — H&P (Signed)
ORTHOPAEDIC H and P  REQUESTING PHYSICIAN: Nicholes Stairs, MD  PCP:  Jerrol Banana., MD  Chief Complaint: Right knee pain  HPI: Jorge [name] is a 54 y.o. male who complains of recalcitrant right knee pain following diagnosis of medial meniscus tear with recent failure of conservative treatment.  He has had injections as well as activity modification and oral medications.  He is here today for arthroscopic intervention.  No new complaints since our previous visit in the office.  Past Medical History:  Diagnosis Date   Allergic rhinitis, seasonal    Anticoagulant long-term use managed by cardiology   asa and xarelto   Bilateral nephrolithiasis    urologist--- dr Junious Silk ([place] office)   Coronary artery disease cardiologist--- dr Angelena Form   nuclear stress test 10-18-2016 w/ normal perfusion;  cardiac cath 12-23-2017, PCI w/ DES to midLAD and mild RCA/ moderate CFx diease nonobstructive , normal LVF;  ETT 07-13-2019 negative for ischemia   History of kidney stones    Hyperlipidemia    Hypertension    followed by pcp and cardiology   OA (osteoarthritis)    knees   OSA on CPAP    12-31-2019  per pt uses cpap every night   PAF (paroxysmal atrial fibrillation) ([place]) 2011   followed by dr Rayann Heman (ep)---  first dx 2011;  s/p DCCV's and ablation x2 in 2015 and last one 03/ 2018, and s/p loop recorder 05/ 2021;   failed multaq, flecainide, and amiodarone   Right knee meniscal tear    S/P ablation of atrial fibrillation followed by dr Rayann Heman   x3  last one 2018   S/P drug eluting coronary stent placement 12/23/2017   x1  to midLAD   SOB (shortness of breath)    12-31-2019  per pt only when he has atrial fib , otherwise no issues with sob with activities   Status post placement of implantable loop recorder 09/08/2019   followed by dr Rayann Heman   Past Surgical History:  Procedure Laterality Date   [place] N/A 11/02/2013   PVI by Dr Rayann Heman   ATRIAL FIBRILLATION ABLATION N/A 03/29/2014   PVI and CTI by Dr Rayann Heman   ATRIAL FIBRILLATION ABLATION N/A 07/04/2016   Procedure: Atrial Fibrillation Ablation;  Surgeon: Thompson Grayer, MD;  Location: [place] [facility];  Service: Cardiovascular;  Laterality: N/A;   CARDIOVERSION N/A 10/15/2013   Procedure: CARDIOVERSION;  Surgeon: Fay Records, MD;  Location: [facility];  Service: Cardiovascular;  Laterality: N/A;   COLONOSCOPY  2016   CORONARY STENT INTERVENTION N/A 12/23/2017   Procedure: CORONARY STENT INTERVENTION;  Surgeon: Burnell Blanks, MD;  Location: [place] [facility];  Service: Cardiovascular;  Laterality: N/A;   EXTRACORPOREAL SHOCK WAVE LITHOTRIPSY  2005;  2013   implantable loop recorder placement  09/08/2019   (done in office)   Medtronic Reveal Linq model LNQ 22 (SN [id number]) implantable loop recorder implanted in office by Dr Rayann Heman for afib management   INGUINAL HERNIA REPAIR Left 05/31/2015   Procedure: HERNIA REPAIR INGUINAL ADULT;  Surgeon: Robert Bellow, MD;  Location: [facility] ORS;  Service: General;  Laterality: Left;   KNEE ARTHROSCOPY Left 2009   @[facility]    RHINOPLASTY  2006    @[facility]    RIGHT/LEFT HEART CATH AND CORONARY ANGIOGRAPHY N/A 12/23/2017   Procedure: RIGHT/LEFT HEART CATH AND CORONARY ANGIOGRAPHY;  Surgeon: Burnell Blanks, MD;  Location: [place] [facility];  Service: Cardiovascular;  Laterality: N/A;   ROBOT ASSISTED INGUINAL HERNIA REPAIR Left 05-01-2013   @[facility]    TEE WITHOUT CARDIOVERSION N/A 11/01/2013   Procedure: TRANSESOPHAGEAL ECHOCARDIOGRAM (TEE);  Surgeon: Candee Furbish, MD;  Location[facility];  Service: Cardiovascular;  Laterality: N/A;   TEE WITHOUT CARDIOVERSION N/A 03/28/2014   Procedure: TRANSESOPHAGEAL ECHOCARDIOGRAM (TEE);  Surgeon: Larey Dresser, MD;  Location: [facility] ENDOSCOPY;  Service: Cardiovascular;  Laterality: N/A;   Social History   Socioeconomic History    Marital status: Married    Spouse name: Not on file   Number of children: Not on file   Years of education: Not on file   Highest education level: Not on file  Occupational History   Occupation: Passenger transport manager w/ [employer]  Tobacco Use   Smoking status: Never Smoker   Smokeless tobacco: Never Used  Scientific laboratory technician Use: Never used  Substance and Sexual Activity   Alcohol use: Yes    Comment: occasionally   Drug use: Never   Sexual activity: Yes  Other Topics Concern   Not on file  Social History Narrative   Lives in [place] with his spouse.  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[employer]:    Difficulty of Paying Living Expenses: Not on [employer] Insecurity:    Worried About Charity fundraiser in the Last Year: Not on file   [employer] of Food in the Last Year: Not on file  Transportation Needs:    Film/video editor (Medical): Not on file   Lack of Transportation (Non-Medical): Not on file  Physical Activity:    Days of Exercise per Week: Not on file   Minutes of Exercise per Session: Not on file  Stress:    Feeling of Stress : Not on file  Social Connections:    Frequency of Communication with Friends and Family: Not on file   Frequency of Social Gatherings with Friends and Family: Not on file   Attends Religious Services: Not on file   Active Member of Clubs or Organizations: Not on file   Attends Archivist Meetings: Not on file   Marital Status: Not on file   Family History  Problem Relation Age of Onset   Cancer Mother        breast, cause of death   Heart attack Maternal Grandfather 07-31-51       Cause of death   Hypertension Maternal Grandfather    Heart failure Brother    CVA Paternal Grandmother        cause of death   Atrial fibrillation Maternal Grandmother    Colon polyps Other        Fam Hx   Kidney cancer Neg Hx    Bladder  Cancer Neg Hx    Prostate cancer Neg Hx    Allergies  Allergen Reactions   Hydrocodone Nausea And Vomiting   Oxycodone Nausea And Vomiting   Prior to Admission medications   Medication Sig Start Date End Date Taking? Authorizing Provider  aspirin 81 MG EC tablet Take 1 tablet by mouth daily. 03/05/11  Yes [provider]  diltiazem (CARDIZEM CD) 240 MG 24 hr capsule Take 1 capsule (240 mg total) by mouth daily. Patient taking differently: Take 240 mg by mouth at bedtime.  07/22/19  Yes Allred, Jeneen Rinks, MD  diltiazem (CARDIZEM) 30 MG tablet TAKE ONE TO TWO TABLETS EVERY 6  HOURS AS NEEDED FOR PALPITATIONS. Patient taking differently: Take 30 mg by mouth every 6 (six) hours as needed.  06/03/18  Yes Allred, Jeneen Rinks, MD  dofetilide (TIKOSYN) 500 MCG capsule Take 1 capsule (500 mcg total) by mouth 2 (two) times daily. Patient taking differently: Take 500 mcg by mouth 2 (two) times daily.  09/13/19  Yes Allred, Jeneen Rinks, MD  fluticasone (FLONASE) 50 MCG/ACT nasal spray Place 2 sprays into both nostrils daily. Patient taking differently: Place 2 sprays into both nostrils daily.  12/16/19  Yes Jerrol Banana., MD  losartan (COZAAR) 50 MG tablet Take 1 tablet (50 mg total) by mouth daily. Patient taking differently: Take 50 mg by mouth at bedtime.  12/13/19  Yes Jerrol Banana., MD  montelukast (SINGULAIR) 10 MG tablet Take 1 tablet (10 mg total) by mouth at bedtime. 12/16/19  Yes Jerrol Banana., MD  Multiple Vitamins-Minerals (MULTIVITAMIN MEN PO) Take by mouth daily.   Yes [provider]  rosuvastatin (CRESTOR) 40 MG tablet Take 1 tablet (40 mg total) by mouth daily. Patient taking differently: Take 40 mg by mouth at bedtime.  07/16/19  Yes Jerrol Banana., MD  tamsulosin (FLOMAX) 0.4 MG CAPS capsule Take 1 capsule (0.4 mg total) by mouth daily after supper. Patient taking differently: Take 0.4 mg by mouth daily as needed.  12/17/19  Yes Festus Aloe, MD    XARELTO 20 MG TABS tablet Take 1 tablet (20 mg total) by mouth daily with supper. Patient taking differently: Take 20 mg by mouth daily with supper.  11/22/19  Yes Allred, Jeneen Rinks, MD  azelastine (ASTELIN) 0.1 % nasal spray Place 1 spray into both nostrils 2 (two) times daily. Patient taking differently: Place 1 spray into both nostrils 2 (two) times daily as needed.  12/16/19   Jerrol Banana., MD  nitroGLYCERIN (NITROSTAT) 0.4 MG SL tablet Place 1 tablet (0.4 mg total) under the tongue every 5 (five) minutes as needed. 12/23/17   Cheryln Manly, NP  traMADol (ULTRAM) 50 MG tablet Take 1 tablet (50 mg total) by mouth every 6 (six) hours as needed. 12/17/19   Festus Aloe, MD   No results found.  Positive ROS: All other systems have been reviewed and were otherwise negative with the exception of those mentioned in the HPI and as above.  Physical Exam: General: Alert, no acute distress Cardiovascular: No pedal edema Respiratory: No cyanosis, no use of accessory musculature GI: No organomegaly, abdomen is soft and non-tender Skin: No lesions in the area of chief complaint Neurologic: Sensation intact distally Psychiatric: Patient is competent for consent with normal mood and affect Lymphatic: No axillary or cervical lymphadenopathy  MUSCULOSKELETAL:  Right lower extremity:  Skin is warm and well-perfused.  No lesions or skin tears about the knee.  Distally neurovascularly intact  Assessment: Right knee acute medial meniscus tear.  Plan: -Our plan is to proceed today with arthroscopic intervention on the right knee with partial medial meniscectomy.  We again reviewed the procedure in detail.  We discussed the risk and benefits.  He has provided informed consent after answering all questions.  -Plan for discharge home postoperatively from PACU.    Nicholes Stairs, MD Cell [phone]    01/04/2020 12:25 PM

## 2020-01-05 ENCOUNTER — Encounter (HOSPITAL_BASED_OUTPATIENT_CLINIC_OR_DEPARTMENT_OTHER): Payer: Self-pay | Admitting: Orthopedic Surgery

## 2020-01-10 ENCOUNTER — Other Ambulatory Visit: Payer: Self-pay | Admitting: Family Medicine

## 2020-01-10 DIAGNOSIS — I10 Essential (primary) hypertension: Secondary | ICD-10-CM

## 2020-01-11 ENCOUNTER — Encounter: Payer: BC Managed Care – PPO | Admitting: Thoracic Surgery (Cardiothoracic Vascular Surgery)

## 2020-01-12 ENCOUNTER — Encounter: Payer: Self-pay | Admitting: Thoracic Surgery (Cardiothoracic Vascular Surgery)

## 2020-01-12 ENCOUNTER — Institutional Professional Consult (permissible substitution) (INDEPENDENT_AMBULATORY_CARE_PROVIDER_SITE_OTHER): Payer: BC Managed Care – PPO | Admitting: Thoracic Surgery (Cardiothoracic Vascular Surgery)

## 2020-01-12 ENCOUNTER — Other Ambulatory Visit: Payer: Self-pay

## 2020-01-12 VITALS — BP 136/85 | HR 78 | Temp 97.7°F | Resp 20 | Ht 70.0 in | Wt 195.0 lb

## 2020-01-12 DIAGNOSIS — I480 Paroxysmal atrial fibrillation: Secondary | ICD-10-CM | POA: Diagnosis not present

## 2020-01-12 DIAGNOSIS — I2510 Atherosclerotic heart disease of native coronary artery without angina pectoris: Secondary | ICD-10-CM

## 2020-01-12 NOTE — H&P (View-Only) (Signed)
[id number]       ,[place] [id number]             [facility] REPORT  Referring Provider is Thompson Grayer, MD Primary Cardiologist is Lauree Chandler, MD PCP is Jerrol Banana., MD  Chief Complaint  Patient presents with  . Atrial Fibrillation    Surgical eval    HPI:  Patient is a 54 year old male with history of coronary artery disease status post PCI and stenting of the left anterior descending coronary artery in 2019, hypertension, obstructive sleep apnea on CPAP, hyperlipidemia, and chronic recurrent paroxysmal atrial fibrillation on long-term anticoagulation who has been referred for surgical consultation to discuss treatment options for management of symptomatic atrial fibrillation that has failed multiple drugs, cardioversion, and catheter-based ablation.  Patient states that he first began to have intermittent symptoms of tachypalpitations more than 20 years ago.  He was first diagnosed with atrial fibrillation in 2011.  He has failed multiple attempts at cardioversion and pharmacological therapy with Multaq, flecainide, amiodarone, and most recently Tikosyn.  He underwent catheter-based ablation in July 2015 and repeat ablation in December 2015.  He has been followed ever since by Dr. Rayann Heman.  Nuclear stress test in 2018 revealed normal perfusion but diagnostic cardiac catheterization performed August 2019 revealed high-grade stenosis of the mid left anterior descending coronary artery with mild to moderate disease in the right coronary artery and left circumflex territories.  He underwent PCI and stenting of left anterior descending coronary artery by Dr. Angelena Form at that time.  He underwent sleep study which did reveal findings consistent with sleep apnea and he now wears CPAP at night.  Since catheter-based ablation he has had less symptoms of palpitations but he continues to have frequent intermittent  episodes of exertional shortness of breath and fatigue, decreased exercise tolerance, intermittent chest tightness, and occasional palpitations.  He monitors his pulse and heart rate regularly and frequently has heart rate elevated as high as 170 to 180 bpm. He has had difficulty with medications for rate control including intolerance of metoprolol.  Follow-up exercise treadmill performed March of this year was felt to be low risk for ischemia.  Implantable loop recorder was placed in May of this year and has documented that the patient's atrial fibrillation burden recently has remained as high as 70%.  He remains chronically anticoagulated using Xarelto and he has not had any bleeding complications nor history of TIA or stroke.  Surgical consultation was requested to consider Maze procedure.  Patient is married and lives locally in [place] with his wife.  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  He has remained reasonably active physically although he complains that he has had to cut back considerably on activity over the last few years.  He complains of exertional shortness of breath and fatigue which limits his lifestyle considerably.  He reports occasional episodes of tightness across his chest that seem to correlate with periods where his heart rate is running fast.  He has occasional palpitations and occasional dizzy spells without syncope.  He denies any resting shortness of breath, PND, orthopnea, or lower extremity edema.  He recently underwent arthroscopy of his right knee for partially torn meniscus.  He seems to be recovering uneventfully.  Past Medical History:  Diagnosis Date  . Allergic rhinitis, seasonal   . Anticoagulant long-term  use managed by cardiology   asa and xarelto  . Bilateral nephrolithiasis    urologist--- dr Junious Silk ([place] office)  . Coronary artery disease cardiologist--- dr  Angelena Form   nuclear stress test 10-18-2016 w/ normal perfusion;  cardiac cath 12-23-2017, PCI w/ DES to midLAD and mild RCA/ moderate CFx diease nonobstructive , normal LVF;  ETT 07-13-2019 negative for ischemia  . History of kidney stones   . Hyperlipidemia   . Hypertension    followed by pcp and cardiology  . OA (osteoarthritis)    knees  . OSA on CPAP    12-31-2019  per pt uses cpap every night  . PAF (paroxysmal atrial fibrillation) ([place]) 2011   followed by dr Rayann Heman (ep)---  first dx 2011;  s/p DCCV's and ablation x2 in 2015 and last one 03/ 2018, and s/p loop recorder 05/ 2021;   failed multaq, flecainide, and amiodarone  . Right knee meniscal tear   . S/P ablation of atrial fibrillation followed by dr allred   x3  last one 2018  . S/P drug eluting coronary stent placement 12/23/2017   x1  to midLAD  . SOB (shortness of breath)    12-31-2019  per pt only when he has atrial fib , otherwise no issues with sob with activities  . Status post placement of implantable loop recorder 09/08/2019   followed by dr Rayann Heman    Past Surgical History:  Procedure Laterality Date  . APPENDECTOMY  1975  . ATRIAL FIBRILLATION ABLATION N/A 11/02/2013   PVI by Dr Rayann Heman  . ATRIAL FIBRILLATION ABLATION N/A 03/29/2014   PVI and CTI by Dr Rayann Heman  . ATRIAL FIBRILLATION ABLATION N/A 07/04/2016   Procedure: Atrial Fibrillation Ablation;  Surgeon: Thompson Grayer, MD;  Location: [facility];  Service: Cardiovascular;  Laterality: N/A;  . CARDIOVERSION N/A 10/15/2013   Procedure: CARDIOVERSION;  Surgeon: Fay Records, MD;  Location: [facility];  Service: Cardiovascular;  Laterality: N/A;  . COLONOSCOPY  2016  . CORONARY STENT INTERVENTION N/A 12/23/2017   Procedure: CORONARY STENT INTERVENTION;  Surgeon: Burnell Blanks, MD;  Location: [facility];  Service: Cardiovascular;  Laterality: N/A;  . EXTRACORPOREAL SHOCK WAVE LITHOTRIPSY  2005;  2013  . implantable loop recorder placement   09/08/2019   (done in office)   Medtronic Reveal Linq model LNQ 22 (SN [id number]) implantable loop recorder implanted in office by Dr Rayann Heman for afib management  . INGUINAL HERNIA REPAIR Left 05/31/2015   Procedure: HERNIA REPAIR INGUINAL ADULT;  Surgeon: Robert Bellow, MD;  Location: [facility];  Service: General;  Laterality: Left;  . KNEE ARTHROSCOPY Left 2009   @[facility]   . KNEE ARTHROSCOPY WITH MEDIAL MENISECTOMY Right 01/04/2020   Procedure: KNEE ARTHROSCOPY WITH PARTIAL MEDIAL MENISECTOMY;  Surgeon: Nicholes Stairs, MD;  Location: [facility];  Service: Orthopedics;  Laterality: Right;  . RHINOPLASTY  2006    @[facility]   . RIGHT/LEFT HEART CATH AND CORONARY ANGIOGRAPHY N/A 12/23/2017   Procedure: RIGHT/LEFT HEART CATH AND CORONARY ANGIOGRAPHY;  Surgeon: Burnell Blanks, MD;  Location: [facility];  Service: Cardiovascular;  Laterality: N/A;  . ROBOT ASSISTED INGUINAL HERNIA REPAIR Left 05-01-2013   @[facility]   . TEE WITHOUT CARDIOVERSION N/A 11/01/2013   Procedure: TRANSESOPHAGEAL ECHOCARDIOGRAM (TEE);  Surgeon: Candee Furbish, MD;  Location: [facility];  Service: Cardiovascular;  Laterality: N/A;  . TEE WITHOUT CARDIOVERSION N/A 03/28/2014   Procedure: TRANSESOPHAGEAL ECHOCARDIOGRAM (TEE);  Surgeon: Larey Dresser, MD;  Location:  [facility];  Service: Cardiovascular;  Laterality: N/A;    Family History  Problem Relation Age of Onset  . Cancer Mother        breast, cause of death  . Heart attack Maternal Grandfather 53       Cause of death  . Hypertension Maternal Grandfather   . Heart failure Brother   . CVA Paternal Grandmother        cause of death  . Atrial fibrillation Maternal Grandmother   . Colon polyps Other        Fam Hx  . Kidney cancer Neg Hx   . Bladder Cancer Neg Hx   . Prostate cancer Neg Hx     Social History   Socioeconomic History  . Marital status: Married    Spouse name: Not on file  . Number of children: Not on file  . Years of  education: Not on file  . Highest education level: Not on file  Occupational History  . Occupation: Passenger transport manager w/ [employer]  Tobacco Use  . Smoking status: Never Smoker  . Smokeless tobacco: Never Used  Vaping Use  . Vaping Use: Never used  Substance and Sexual Activity  . Alcohol use: Yes    Comment: occasionally  . Drug use: Never  . Sexual activity: Yes  Other Topics Concern  . Not on file  Social History Narrative   Lives in [place] with his spouse.  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[employer]   Social Determinants of Radio broadcast assistant Strain:   . Difficulty of Paying Living Expenses: Not on file  Food Insecurity:   . Worried About Charity fundraiser in the Last Year: Not on file  . Ran Out of Food in the Last Year: Not on file  Transportation Needs:   . Lack of Transportation (Medical): Not on file  . Lack of Transportation (Non-Medical): Not on file  Physical Activity:   . Days of Exercise per Week: Not on file  . Minutes of Exercise per Session: Not on file  Stress:   . Feeling of Stress : Not on file  Social Connections:   . Frequency of Communication with Friends and Family: Not on file  . Frequency of Social Gatherings with Friends and Family: Not on file  . Attends Religious Services: Not on file  . Active Member of Clubs or Organizations: Not on file  . Attends Archivist Meetings: Not on file  . Marital Status: Not on file  Intimate Partner Violence:   . Fear of Current or Ex-Partner: Not on file  . Emotionally Abused: Not on file  . Physically Abused: Not on file  . Sexually Abused: Not on file    Current Outpatient Medications  Medication Sig Dispense Refill  . aspirin 81 MG EC tablet Take 1 tablet by mouth daily.    Marland Kitchen azelastine (ASTELIN) 0.1 % nasal spray Place 1 spray into both nostrils 2 (two) times daily. (Patient taking differently: Place 1 spray into both nostrils 2 (two) times daily as needed. ) 30 mL  12  . diltiazem (CARDIZEM CD) 240 MG 24 hr capsule Take 1 capsule (240 mg total) by mouth daily. (Patient taking differently: Take 240 mg by mouth at bedtime. ) 90 capsule 1  . dofetilide (TIKOSYN) 500 MCG capsule Take 1 capsule (500 mcg total) by mouth 2 (two) times daily. (Patient taking differently: Take 500 mcg by mouth 2 (two) times daily. ) 180  capsule 3  . fluticasone (FLONASE) 50 MCG/ACT nasal spray Place 2 sprays into both nostrils daily. (Patient taking differently: Place 2 sprays into both nostrils daily. ) 16 g 3  . HYDROcodone-acetaminophen (NORCO/VICODIN) 5-325 MG tablet Take 1-2 tablets by mouth every 6 (six) hours as needed for moderate pain or severe pain. 30 tablet 0  . losartan (COZAAR) 50 MG tablet Take 1 tablet (50 mg total) by mouth daily. 30 tablet 11  . montelukast (SINGULAIR) 10 MG tablet Take 1 tablet (10 mg total) by mouth at bedtime. 90 tablet 3  . Multiple Vitamins-Minerals (MULTIVITAMIN MEN PO) Take by mouth daily.    . ondansetron (ZOFRAN ODT) 4 MG disintegrating tablet Take 1 tablet (4 mg total) by mouth every 8 (eight) hours as needed for nausea or vomiting. 20 tablet 0  . rosuvastatin (CRESTOR) 40 MG tablet Take 1 tablet (40 mg total) by mouth daily. (Patient taking differently: Take 40 mg by mouth at bedtime. ) 90 tablet 2  . tamsulosin (FLOMAX) 0.4 MG CAPS capsule Take 1 capsule (0.4 mg total) by mouth daily after supper. (Patient taking differently: Take 0.4 mg by mouth daily as needed. ) 30 capsule 3  . traMADol (ULTRAM) 50 MG tablet Take 1 tablet (50 mg total) by mouth every 6 (six) hours as needed. 15 tablet 0  . XARELTO 20 MG TABS tablet Take 1 tablet (20 mg total) by mouth daily with supper. (Patient taking differently: Take 20 mg by mouth daily with supper. ) 90 tablet 1   No current facility-administered medications for this visit.    Allergies  Allergen Reactions  . Hydrocodone Nausea And Vomiting  . Oxycodone Nausea And Vomiting      Review of  Systems:   General:  normal appetite, decreased energy, no weight gain, no weight loss, no fever  Cardiac:  + chest pain with exertion, no chest pain at rest, +SOB with exertion, no resting SOB, no PND, no orthopnea, + palpitations, + arrhythmia, + atrial fibrillation, no LE edema, + dizzy spells, no syncope  Respiratory:  no shortness of breath, no home oxygen, no productive cough, no dry cough, no bronchitis, no wheezing, no hemoptysis, no asthma, no pain with inspiration or cough, + sleep apnea, + CPAP at night  GI:   no difficulty swallowing, no reflux, no frequent heartburn, no hiatal hernia, no abdominal pain, no constipation, no diarrhea, no hematochezia, no hematemesis, no melena  GU:   no dysuria,  + frequency, no urinary tract infection, no hematuria, + enlarged prostate, no kidney stones, no kidney disease  Vascular:  no pain suggestive of claudication, no pain in feet, no leg cramps, no varicose veins, no DVT, no non-healing foot ulcer  Neuro:   no stroke, no TIA's, no seizures, no headaches, no temporary blindness one eye,  no slurred speech, no peripheral neuropathy, no chronic pain, no instability of gait, no memory/cognitive dysfunction  Musculoskeletal: + arthritis, no joint swelling, no myalgias, no difficulty walking, normal mobility   Skin:   no rash, no itching, no skin infections, no pressure sores or ulcerations  Psych:   no anxiety, no depression, no nervousness, no unusual recent stress  Eyes:   no blurry vision, no floaters, no recent vision changes,  wears glasses for reading only  ENT:   no hearing loss, no loose or painful teeth, no dentures, last saw dentist within the past year  Hematologic:  no easy bruising, no abnormal bleeding, no clotting disorder, no frequent epistaxis  Endocrine:  no diabetes, does not check CBG's at home     Physical Exam:   BP 136/85   Pulse 78   Temp 97.7 F (36.5 C) (Skin)   Resp 20   Ht 5\' 10"  (1.778 m)   Wt 195 lb (88.5 kg)    SpO2 96% Comment: RA  BMI 27.98 kg/m   General:    well-appearing  HEENT:  Unremarkable   Neck:   no JVD, no bruits, no adenopathy   Chest:   clear to auscultation, symmetrical breath sounds, no wheezes, no rhonchi   CV:   Irregular rate and rhythm, no murmur   Abdomen:  soft, non-tender, no masses   Extremities:  warm, well-perfused, pulses palpable, no LE edema  Rectal/GU  Deferred  Neuro:   Grossly non-focal and symmetrical throughout  Skin:   Clean and dry, no rashes, no breakdown   Diagnostic Tests:  EKG: Atrial fibrillation w/out acute ischemic changes, HR 113 (12/16/2019)     ECHOCARDIOGRAM REPORT       Patient Name:  DEMICHAEL TRAUM II Date of Exam: 05/12/2019  Medical Rec #: [id number]     Height:    70.0 in  Accession #:  [id number]     Weight:    196.2 lb  Date of Birth: 08-01-1965     BSA:     2.07 m  Patient Age:  13 years      BP:      128/82 mmHg  Patient Gender: M         HR:      109 bpm.  Exam Location: [address]   Procedure: 2D Echo, Cardiac Doppler and Color Doppler   Indications:  I35.1    History:    Patient has prior history of Echocardiogram examinations,  most         recent 01/26/2015. CAD, AI, Arrythmias:Atrial Fibrillation;  Risk         Factors:Hypertension and Dyslipidemia.    Sonographer:  Coralyn Helling RDCS  Referring Phys: [place]    1. Left ventricular ejection fraction, by visual estimation, is 50 to  55%. The left ventricle has normal function. Left ventricular septal wall  thickness was moderately increased. Moderately increased left ventricular  posterior wall thickness. There is  no left ventricular hypertrophy.  2. Left ventricular diastolic function could not be evaluated.  3. The left ventricle has no regional wall motion abnormalities.  4. Global right ventricle has normal systolic  function.The right  ventricular size is normal. No increase in right ventricular wall  thickness.  5. Left atrial size was normal.  6. Right atrial size was normal.  7. Mild calcification of the anterior mitral valve leaflet(s).  8. The mitral valve is degenerative. Trivial mitral valve regurgitation.  No evidence of mitral stenosis.  9. The tricuspid valve is normal in structure.  10. The aortic valve is tricuspid. Aortic valve regurgitation is mild.  Mild aortic valve sclerosis without stenosis.  11. The pulmonic valve was normal in structure. Pulmonic valve  regurgitation is not visualized.  12. TR signal is inadequate for assessing pulmonary artery systolic  pressure.  13. The inferior vena cava is normal in size with greater than 50%  respiratory variability, suggesting right atrial pressure of 3 mmHg.   FINDINGS  Left Ventricle: Left ventricular ejection fraction, by visual estimation,  is 50 to 55%. The left ventricle has normal function. The left ventricle  has no regional  wall motion abnormalities. Moderately increased left  ventricular posterior wall thickness.  There is no left ventricular hypertrophy. The left ventricular diastology  could not be evaluated due to atrial fibrillation. Left ventricular  diastolic function could not be evaluated. Normal left atrial pressure.   Right Ventricle: The right ventricular size is normal. No increase in  right ventricular wall thickness. Global RV systolic function is has  normal systolic function.   Left Atrium: Left atrial size was normal in size.   Right Atrium: Right atrial size was normal in size   Pericardium: There is no evidence of pericardial effusion.   Mitral Valve: The mitral valve is degenerative in appearance. There is  mild calcification of the anterior mitral valve leaflet(s). Trivial mitral  valve regurgitation. No evidence of mitral valve stenosis by observation.   Tricuspid Valve: The tricuspid  valve is normal in structure. Tricuspid  valve regurgitation is mild.   Aortic Valve: The aortic valve is tricuspid. Aortic valve regurgitation is  mild. Mild aortic valve sclerosis is present, with no evidence of aortic  valve stenosis.   Pulmonic Valve: The pulmonic valve was normal in structure. Pulmonic valve  regurgitation is not visualized. Pulmonic regurgitation is not visualized.   Aorta: The aortic root, ascending aorta and aortic arch are all  structurally normal, with no evidence of dilitation or obstruction.   Venous: The inferior vena cava is normal in size with greater than 50%  respiratory variability, suggesting right atrial pressure of 3 mmHg.   IAS/Shunts: No atrial level shunt detected by color flow Doppler. There is  no evidence of a patent foramen ovale. No ventricular septal defect is  seen or detected. There is no evidence of an atrial septal defect.     LEFT VENTRICLE  PLAX 2D  LVIDd:     4.30 cm  LVIDs:     2.60 cm  LV PW:     1.60 cm  LV IVS:    1.30 cm  LVOT diam:   2.10 cm  LV SV:     58 ml  LV SV Index:  27.65  LVOT Area:   3.46 cm     IVC  IVC diam: 0.70 cm   LEFT ATRIUM       Index    RIGHT ATRIUM      Index  LA diam:    3.60 cm 1.74 cm/m RA Pressure: 3.00 mmHg  LA Vol (A2C):  60.9 ml 29.41 ml/m RA Area:   19.90 cm  LA Vol (A4C):  50.3 ml 24.30 ml/m RA Volume:  61.50 ml 29.70 ml/m  LA Biplane Vol: 59.9 ml 28.93 ml/m  AORTIC VALVE  LVOT Vmax:  83.80 cm/s  LVOT Vmean: 53.580 cm/s  LVOT VTI:  0.126 m    AORTA  Ao Root diam: 3.60 cm  Ao Asc diam: 3.40 cm   MITRAL VALVE            TRICUSPID VALVE  MV Area (PHT):           Estimated RAP: 3.00 mmHg    MV Decel Time: 232 msec      SHUNTS  MV E velocity: 77.02 cm/s 103 cm/s Systemic VTI: 0.13 m                   Systemic Diam: 2.10 cm     Fransico Him MD  Electronically  signed by Fransico Him MD  Signature Date/Time: 05/12/2019/9:17:02 AM      CORONARY STENT INTERVENTION  RIGHT/LEFT HEART CATH AND CORONARY ANGIOGRAPHY  Conclusion    Mid RCA lesion is 30% stenosed.  Post Atrio lesion is 30% stenosed.  Prox Cx lesion is 30% stenosed.  Ost 2nd Mrg lesion is 30% stenosed.  Prox LAD lesion is 80% stenosed.  Mid Cx lesion is 50% stenosed.  Ost 1st Diag lesion is 20% stenosed.  A drug-eluting stent was successfully placed using a STENT RESOLUTE ONYX 3.0X12.  Post intervention, there is a 0% residual stenosis.  The left ventricular systolic function is normal.  LV end diastolic pressure is normal.  The left ventricular ejection fraction is 50-55% by visual estimate.  There is no mitral valve regurgitation.   1. Severe stenosis mid LAD just after a large Diagonal branch 2. Successful PTCA/DES x 1 mid LAD 3. Moderate non-obstructive disease in the Circumflex and obtuse marginal branch 4. Mild disease in the large dominant RCA 5. Normal LV systolic function  Recommendations: Same day PCI discharge. Will increase Lipitor to 80 mg daily. Will need Lipids and LFTs in 12 weeks.  Recommend uninterrupted dual antiplatelet therapy with Aspirin 81mg  daily and Clopidogrel 75mg  daily for a minimum of 6 months (stable ischemic heart disease - Class I recommendation). If he tolerates DAPT, would continue for one full year.   Indications  Unstable angina (HCC) [I20.0 (ICD-10-CM)]  Procedural Details  Technical Details Indication: 54 yo male with history of atrial fibrillation, hyperlipidemia with dyspnea and chest pain c/w unstable angina.   Procedure: The risks, benefits, complications, treatment options, and expected outcomes were discussed with the patient. The patient and/or family concurred with the proposed plan, giving informed consent. The patient was brought to the cath lab after IV hydration was given. The patient was sedated with Versed  and Fentanyl. I changed the IV catheter in the right antecubital vein with a 5 French sheath. Right heart cath performed with a balloon tipped catheter. The right wrist was prepped and draped in a sterile fashion. 1% lidocaine was used for local anesthesia. Using the modified Seldinger access technique, a 5 French sheath was placed in the right radial artery. 3 mg Verapamil was given through the sheath. 4500 units IV heparin was given. Standard diagnostic catheters were used to perform selective coronary angiography. A pigtail catheter was used to perform a left ventricular angiogram.   PCI Note: XB LAD 3.5 guiding catheter to engage the left main. Heparin used for anti-coagulation. ACT over 300. Plavix 600 mg po x 1. Cougar IC wire advanced down the LAD. 2.5 x 12 mm balloon used to dilate the stenosis in the mid LAD. I then deployed a 3.0 x 12 mm Resolute Onyx DES in the mid LAD. The stent was post-dilated with a 3.25 x 6 mm [place] balloon x 2. Excellent flow down the Diagonal branch following the PCI of the LAD with slight plaque shift to the ostium of the Diagonal branch.    The sheath was removed from the right radial artery and a Terumo hemostasis band was applied at the arteriotomy site on the right wrist.      Estimated blood loss <50 mL.  During this procedure the patient was administered the following to achieve and maintain moderate conscious sedation: Versed 4 mg, Fentanyl 100 mcg,  while the patient's heart rate, blood pressure, and oxygen saturation were continuously monitored. The period of conscious sedation was 79 minutes, of which I was present face-to-face 100% of this time.  Medications (Filter: Administrations occurring from [phone] to 1031 on 12/23/17) (  important) Continuous medications are totaled by the amount administered until 12/23/17 1031.  midazolam (VERSED) injection (mg) Total dose:  4 mg Date/Time  Rate/Dose/Volume Action  12/23/17 0851  2 mg Given  0913  1 mg Given    0941  1 mg Given    fentaNYL (SUBLIMAZE) injection (mcg) Total dose:  100 mcg Date/Time  Rate/Dose/Volume Action  12/23/17 0852  50 mcg Given  0913  25 mcg Given  0941  25 mcg Given    lidocaine (PF) (XYLOCAINE) 1 % injection (mL) Total volume:  6 mL Date/Time  Rate/Dose/Volume Action  12/23/17 0909  2 mL Given  0915  4 mL Given    Radial Cocktail/Verapamil only (mL) Total volume:  10 mL Date/Time  Rate/Dose/Volume Action  12/23/17 0917  10 mL Given    heparin injection (Units) Total dose:  14,000 Units Date/Time  Rate/Dose/Volume Action  12/23/17 0919  4,500 Units Given  0929  6,500 Units Given  0943  3,000 Units Given    clopidogrel (PLAVIX) tablet (mg) Total dose:  600 mg Date/Time  Rate/Dose/Volume Action  12/23/17 0935  600 mg Given    famotidine (PEPCID) IVPB 20 mg premix (mg) Total dose:  Cannot be calculated* *Infusion rate not documented for continuous administration Date/Time  Rate/Dose/Volume Action  12/23/17 0939  20 mg - 100 mL/hr New Bag/Given  1007  (over 30 min) Stopped    nitroGLYCERIN 1 mg/10 mL (100 mcg/mL) - IR/CATH LAB (mcg) Total dose:  200 mcg Date/Time  Rate/Dose/Volume Action  12/23/17 1003  200 mcg Given    iopamidol (ISOVUE-370) 76 % injection (mL) Total volume:  180 mL Date/Time  Rate/Dose/Volume Action  12/23/17 1011  180 mL Given    Heparin (Porcine) in NaCl 1000-0.9 UT/500ML-% SOLN (mL) Total volume:  1,000 mL Date/Time  Rate/Dose/Volume Action  12/23/17 1011  500 mL Given  1011  500 mL Given    0.9 % sodium chloride infusion (mL/hr) Total dose:  Cannot be calculated* Dosing weight:  86.2 *Continuous medication not stopped within the calculation time range. Date/Time  Rate/Dose/Volume Action  12/23/17 1016  10 mL/hr New Bag/Given  1028  30 mL/hr Rate/Dose Change    Sedation Time  Sedation Time Physician-1: 1 hour 14 minutes 36 seconds  Contrast  Medication Name Total Dose  iopamidol (ISOVUE-370) 76 % injection 180  mL    Radiation/Fluoro  Fluoro time: 10 (min) DAP: 03474 (mGycm2) Cumulative Air Kerma: 259 (mGy)  Complications  Complications documented before study signed (12/23/2017 12:33 PM)     Log Level Complications  None Documented by Burnell Blanks, MD 12/23/2017 10:42 AM  Time Range: Intraprocedure    Coronary Findings  Diagnostic Dominance: Right Left Anterior Descending  Vessel is large.  Prox LAD lesion is 80% stenosed.  First Diagonal Branch  Vessel is moderate in size.  Ost 1st Diag lesion is 20% stenosed.  Left Circumflex  Prox Cx lesion is 30% stenosed.  Mid Cx lesion is 50% stenosed.  First Obtuse Marginal Branch  Vessel is small in size.  Second Obtuse Marginal Branch  Vessel is moderate in size.  Ost 2nd Mrg lesion is 30% stenosed.  Right Coronary Artery  Vessel is large.  Mid RCA lesion is 30% stenosed.  Right Posterior Atrioventricular Artery  Vessel is large in size.  Post Atrio lesion is 30% stenosed.  Intervention  Prox LAD lesion  Stent  CATH VISTA GUIDE 6FR XBLAD3.5 guide catheter was inserted. Pre-stent angioplasty was performed using a BALLOON  SAPPHIRE 2.5X12. A drug-eluting stent was successfully placed using a STENT RESOLUTE ONYX 3.0X12. Stent strut is well apposed. Post-stent angioplasty was performed using a BALLOON [place] EMERGE MR 3.25X6.  Post-Intervention Lesion Assessment  The intervention was successful. Pre-interventional TIMI flow is 3. Post-intervention TIMI flow is 3. No complications occurred at this lesion.  There is a 0% residual stenosis post intervention.  Wall Motion  Resting               Left Heart  Left Ventricle The left ventricular size is normal. The left ventricular systolic function is normal. LV end diastolic pressure is normal. The left ventricular ejection fraction is 50-55% by visual estimate. No regional wall motion abnormalities. There is no evidence of mitral regurgitation.  Coronary  Diagrams  Diagnostic Dominance: Right  Intervention      Impression:  Patient has chronic recurrent paroxysmal atrial fibrillation which appears to be approaching longstanding persistent atrial fibrillation that has failed pharmacologic therapy on multiple drugs as well as 2 previous attempts at catheter-based ablation.  Recent interrogation of implantable loop recorder documents that the patient remains in atrial fibrillation more than 70% of the time.  He remains symptomatic with primary complaints of exertional shortness of breath and fatigue as well as occasional chest tightness, palpitations and dizzy spells.  I have personally reviewed the patient's most recent EKG, transthoracic echocardiogram performed January of this year, and diagnostic cardiac catheterization performed in 2019 at the time of previous PCI and stenting.  There have been no signs of left ventricular or left atrial chamber enlargement on previous echoes.  Left ventricular systolic function has remained normal and there does not appear to be significant left atrial enlargement.  The patient does have at least mild aortic insufficiency although I do not appreciate a significant diastolic murmur on physical exam.  Options include continued medical therapy without any further intervention at this time, repeat attempts at catheter-based ablation, or possible surgical intervention.  Given the patient's relatively young age I think it would be reasonable to consider surgical Maze procedure.  The patient may be a good candidate for minimally invasive approach.   Plan:  I have discussed the natural history of atrial fibrillation at length with the patient and his wife in the office today.  We discussed the differences between primary lone atrial fibrillation and atrial fibrillation associated with other cardiac or respiratory problems.  We discussed treatment strategies including rate control with long-term anticoagulation,  pharmacologic therapy, catheter-based therapies, and a variety of surgical options.  We discussed the rationale for surgical Maze procedure and our local experience for patients both with and without need for concomitant surgical procedures.  We discussed alternative surgical approaches including both conventional sternotomy and minithoracotomy approach for a full Maze procedure versus other less invasive surgical approaches without use of cardiopulmonary bypass such as the convergent procedure.  Expectations for the patient's postoperative convalescence and long-term freedom from atrial fibrillation have been discussed.   Patient is interested in proceeding with surgical intervention but wants to discuss matters further with his wife and family before making a final decision.  If he ultimately decides that he would like to proceed with surgery he will need follow-up diagnostic cardiac catheterization to make sure that he has not had significant progression of his coronary artery disease.  In addition, the patient would undergo CT angiography to evaluate the feasibility of peripheral cannulation for surgery.  Patient will contact our office in the near future to let us know  whether or not he desires to proceed with surgery at this time.  All of his questions have been answered.   I spent in excess of 90 minutes during the conduct of this office consultation and >50% of this time involved direct face-to-face encounter with the patient for counseling and/or coordination of their care.    Valentina Gu. Roxy Manns, MD 01/12/2020 1:39 PM

## 2020-01-12 NOTE — Progress Notes (Signed)
[address]       [place] [address]             [facility] REPORT  Referring Provider is Thompson Grayer, MD Primary Cardiologist is Lauree Chandler, MD PCP is Jerrol Banana., MD  Chief Complaint  Patient presents with  . Atrial Fibrillation    Surgical eval    HPI:  Patient is a 54 year old male with history of coronary artery disease status post PCI and stenting of the left anterior descending coronary artery in 2019, hypertension, obstructive sleep apnea on CPAP, hyperlipidemia, and chronic recurrent paroxysmal atrial fibrillation on long-term anticoagulation who has been referred for surgical consultation to discuss treatment options for management of symptomatic atrial fibrillation that has failed multiple drugs, cardioversion, and catheter-based ablation.  Patient states that he first began to have intermittent symptoms of tachypalpitations more than 20 years ago.  He was first diagnosed with atrial fibrillation in 2011.  He has failed multiple attempts at cardioversion and pharmacological therapy with Multaq, flecainide, amiodarone, and most recently Tikosyn.  He underwent catheter-based ablation in July 2015 and repeat ablation in December 2015.  He has been followed ever since by Dr. Rayann Heman.  Nuclear stress test in 2018 revealed normal perfusion but diagnostic cardiac catheterization performed August 2019 revealed high-grade stenosis of the mid left anterior descending coronary artery with mild to moderate disease in the right coronary artery and left circumflex territories.  He underwent PCI and stenting of left anterior descending coronary artery by Dr. Angelena Form at that time.  He underwent sleep study which did reveal findings consistent with sleep apnea and he now wears CPAP at night.  Since catheter-based ablation he has had less symptoms of palpitations but he continues to have frequent intermittent  episodes of exertional shortness of breath and fatigue, decreased exercise tolerance, intermittent chest tightness, and occasional palpitations.  He monitors his pulse and heart rate regularly and frequently has heart rate elevated as high as 170 to 180 bpm. He has had difficulty with medications for rate control including intolerance of metoprolol.  Follow-up exercise treadmill performed March of this year was felt to be low risk for ischemia.  Implantable loop recorder was placed in May of this year and has documented that the patient's atrial fibrillation burden recently has remained as high as 70%.  He remains chronically anticoagulated using Xarelto and he has not had any bleeding complications nor history of TIA or stroke.  Surgical consultation was requested to consider Maze procedure.  Patient is married and lives locally in [place] with his wife.  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  He has remained reasonably active physically although he complains that he has had to cut back considerably on activity over the last few years.  He complains of exertional shortness of breath and fatigue which limits his lifestyle considerably.  He reports occasional episodes of tightness across his chest that seem to correlate with periods where his heart rate is running fast.  He has occasional palpitations and occasional dizzy spells without syncope.  He denies any resting shortness of breath, PND, orthopnea, or lower extremity edema.  He recently underwent arthroscopy of his right knee for partially torn meniscus.  He seems to be recovering uneventfully.  Past Medical History:  Diagnosis Date  . Allergic rhinitis, seasonal   . Anticoagulant long-term  use managed by cardiology   asa and xarelto  . Bilateral nephrolithiasis    urologist--- dr Junious Silk ([place] office)  . Coronary artery disease cardiologist--- dr  Angelena Form   nuclear stress test 10-18-2016 w/ normal perfusion;  cardiac cath 12-23-2017, PCI w/ DES to midLAD and mild RCA/ moderate CFx diease nonobstructive , normal LVF;  ETT 07-13-2019 negative for ischemia  . History of kidney stones   . Hyperlipidemia   . Hypertension    followed by pcp and cardiology  . OA (osteoarthritis)    knees  . OSA on CPAP    12-31-2019  per pt uses cpap every night  . PAF (paroxysmal atrial fibrillation) ([place]) 2011   followed by dr Rayann Heman (ep)---  first dx 2011;  s/p DCCV's and ablation x2 in 2015 and last one 03/ 2018, and s/p loop recorder 05/ 2021;   failed multaq, flecainide, and amiodarone  . Right knee meniscal tear   . S/P ablation of atrial fibrillation followed by dr allred   x3  last one 2018  . S/P drug eluting coronary stent placement 12/23/2017   x1  to midLAD  . SOB (shortness of breath)    12-31-2019  per pt only when he has atrial fib , otherwise no issues with sob with activities  . Status post placement of implantable loop recorder 09/08/2019   followed by dr Rayann Heman    Past Surgical History:  Procedure Laterality Date  . APPENDECTOMY  1975  . ATRIAL FIBRILLATION ABLATION N/A 11/02/2013   PVI by Dr Rayann Heman  . ATRIAL FIBRILLATION ABLATION N/A 03/29/2014   PVI and CTI by Dr Rayann Heman  . ATRIAL FIBRILLATION ABLATION N/A 07/04/2016   Procedure: Atrial Fibrillation Ablation;  Surgeon: Thompson Grayer, MD;  Location: [facility];  Service: Cardiovascular;  Laterality: N/A;  . CARDIOVERSION N/A 10/15/2013   Procedure: CARDIOVERSION;  Surgeon: Fay Records, MD;  Location: [facility];  Service: Cardiovascular;  Laterality: N/A;  . COLONOSCOPY  2016  . CORONARY STENT INTERVENTION N/A 12/23/2017   Procedure: CORONARY STENT INTERVENTION;  Surgeon: Burnell Blanks, MD;  Location: [facility];  Service: Cardiovascular;  Laterality: N/A;  . EXTRACORPOREAL SHOCK WAVE LITHOTRIPSY  2005;  2013  . implantable loop recorder placement   09/08/2019   (done in office)   Medtronic Reveal Linq model LNQ 22 (SN [id number]) implantable loop recorder implanted in office by Dr Rayann Heman for afib management  . INGUINAL HERNIA REPAIR Left 05/31/2015   Procedure: HERNIA REPAIR INGUINAL ADULT;  Surgeon: Robert Bellow, MD;  Location: [facility];  Service: General;  Laterality: Left;  . KNEE ARTHROSCOPY Left 2009   @[facility]   . KNEE ARTHROSCOPY WITH MEDIAL MENISECTOMY Right 01/04/2020   Procedure: KNEE ARTHROSCOPY WITH PARTIAL MEDIAL MENISECTOMY;  Surgeon: Nicholes Stairs, MD;  Location: [facility];  Service: Orthopedics;  Laterality: Right;  . RHINOPLASTY  2006    @[facility]   . RIGHT/LEFT HEART CATH AND CORONARY ANGIOGRAPHY N/A 12/23/2017   Procedure: RIGHT/LEFT HEART CATH AND CORONARY ANGIOGRAPHY;  Surgeon: Burnell Blanks, MD;  Location: [facility];  Service: Cardiovascular;  Laterality: N/A;  . ROBOT ASSISTED INGUINAL HERNIA REPAIR Left 05-01-2013   @[facility]   . TEE WITHOUT CARDIOVERSION N/A 11/01/2013   Procedure: TRANSESOPHAGEAL ECHOCARDIOGRAM (TEE);  Surgeon: Candee Furbish, MD;  Location: [facility];  Service: Cardiovascular;  Laterality: N/A;  . TEE WITHOUT CARDIOVERSION N/A 03/28/2014   Procedure: TRANSESOPHAGEAL ECHOCARDIOGRAM (TEE);  Surgeon: Larey Dresser, MD;  Location:  [facility];  Service: Cardiovascular;  Laterality: N/A;    Family History  Problem Relation Age of Onset  . Cancer Mother        breast, cause of death  . Heart attack Maternal Grandfather 53       Cause of death  . Hypertension Maternal Grandfather   . Heart failure Brother   . CVA Paternal Grandmother        cause of death  . Atrial fibrillation Maternal Grandmother   . Colon polyps Other        Fam Hx  . Kidney cancer Neg Hx   . Bladder Cancer Neg Hx   . Prostate cancer Neg Hx     Social History   Socioeconomic History  . Marital status: Married    Spouse name: Not on file  . Number of children: Not on file  . Years of  education: Not on file  . Highest education level: Not on file  Occupational History  . Occupation: Passenger transport manager w/  [employer]  Tobacco Use  . Smoking status: Never Smoker  . Smokeless tobacco: Never Used  Vaping Use  . Vaping Use: Never used  Substance and Sexual Activity  . Alcohol use: Yes    Comment: occasionally  . Drug use: Never  . Sexual activity: Yes  Other Topics Concern  . Not on file  Social History Narrative   Lives in [place] with his spouse.  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[employer]   Social Determinants of Radio broadcast assistant Strain:   . Difficulty of Paying Living Expenses: Not on file  Food Insecurity:   . Worried About Charity fundraiser in the Last Year: Not on file  . Ran Out of Food in the Last Year: Not on file  Transportation Needs:   . Lack of Transportation (Medical): Not on file  . Lack of Transportation (Non-Medical): Not on file  Physical Activity:   . Days of Exercise per Week: Not on file  . Minutes of Exercise per Session: Not on file  Stress:   . Feeling of Stress : Not on file  Social Connections:   . Frequency of Communication with Friends and Family: Not on file  . Frequency of Social Gatherings with Friends and Family: Not on file  . Attends Religious Services: Not on file  . Active Member of Clubs or Organizations: Not on file  . Attends Archivist Meetings: Not on file  . Marital Status: Not on file  Intimate Partner Violence:   . Fear of Current or Ex-Partner: Not on file  . Emotionally Abused: Not on file  . Physically Abused: Not on file  . Sexually Abused: Not on file    Current Outpatient Medications  Medication Sig Dispense Refill  . aspirin 81 MG EC tablet Take 1 tablet by mouth daily.    Marland Kitchen azelastine (ASTELIN) 0.1 % nasal spray Place 1 spray into both nostrils 2 (two) times daily. (Patient taking differently: Place 1 spray into both nostrils 2 (two) times daily as needed. ) 30 mL  12  . diltiazem (CARDIZEM CD) 240 MG 24 hr capsule Take 1 capsule (240 mg total) by mouth daily. (Patient taking differently: Take 240 mg by mouth at bedtime. ) 90 capsule 1  . dofetilide (TIKOSYN) 500 MCG capsule Take 1 capsule (500 mcg total) by mouth 2 (two) times daily. (Patient taking differently: Take 500 mcg by mouth 2 (two) times daily. ) 180  capsule 3  . fluticasone (FLONASE) 50 MCG/ACT nasal spray Place 2 sprays into both nostrils daily. (Patient taking differently: Place 2 sprays into both nostrils daily. ) 16 g 3  . HYDROcodone-acetaminophen (NORCO/VICODIN) 5-325 MG tablet Take 1-2 tablets by mouth every 6 (six) hours as needed for moderate pain or severe pain. 30 tablet 0  . losartan (COZAAR) 50 MG tablet Take 1 tablet (50 mg total) by mouth daily. 30 tablet 11  . montelukast (SINGULAIR) 10 MG tablet Take 1 tablet (10 mg total) by mouth at bedtime. 90 tablet 3  . Multiple Vitamins-Minerals (MULTIVITAMIN MEN PO) Take by mouth daily.    . ondansetron (ZOFRAN ODT) 4 MG disintegrating tablet Take 1 tablet (4 mg total) by mouth every 8 (eight) hours as needed for nausea or vomiting. 20 tablet 0  . rosuvastatin (CRESTOR) 40 MG tablet Take 1 tablet (40 mg total) by mouth daily. (Patient taking differently: Take 40 mg by mouth at bedtime. ) 90 tablet 2  . tamsulosin (FLOMAX) 0.4 MG CAPS capsule Take 1 capsule (0.4 mg total) by mouth daily after supper. (Patient taking differently: Take 0.4 mg by mouth daily as needed. ) 30 capsule 3  . traMADol (ULTRAM) 50 MG tablet Take 1 tablet (50 mg total) by mouth every 6 (six) hours as needed. 15 tablet 0  . XARELTO 20 MG TABS tablet Take 1 tablet (20 mg total) by mouth daily with supper. (Patient taking differently: Take 20 mg by mouth daily with supper. ) 90 tablet 1   No current facility-administered medications for this visit.    Allergies  Allergen Reactions  . Hydrocodone Nausea And Vomiting  . Oxycodone Nausea And Vomiting      Review of  Systems:   General:  normal appetite, decreased energy, no weight gain, no weight loss, no fever  Cardiac:  + chest pain with exertion, no chest pain at rest, +SOB with exertion, no resting SOB, no PND, no orthopnea, + palpitations, + arrhythmia, + atrial fibrillation, no LE edema, + dizzy spells, no syncope  Respiratory:  no shortness of breath, no home oxygen, no productive cough, no dry cough, no bronchitis, no wheezing, no hemoptysis, no asthma, no pain with inspiration or cough, + sleep apnea, + CPAP at night  GI:   no difficulty swallowing, no reflux, no frequent heartburn, no hiatal hernia, no abdominal pain, no constipation, no diarrhea, no hematochezia, no hematemesis, no melena  GU:   no dysuria,  + frequency, no urinary tract infection, no hematuria, + enlarged prostate, no kidney stones, no kidney disease  Vascular:  no pain suggestive of claudication, no pain in feet, no leg cramps, no varicose veins, no DVT, no non-healing foot ulcer  Neuro:   no stroke, no TIA's, no seizures, no headaches, no temporary blindness one eye,  no slurred speech, no peripheral neuropathy, no chronic pain, no instability of gait, no memory/cognitive dysfunction  Musculoskeletal: + arthritis, no joint swelling, no myalgias, no difficulty walking, normal mobility   Skin:   no rash, no itching, no skin infections, no pressure sores or ulcerations  Psych:   no anxiety, no depression, no nervousness, no unusual recent stress  Eyes:   no blurry vision, no floaters, no recent vision changes,  wears glasses for reading only  ENT:   no hearing loss, no loose or painful teeth, no dentures, last saw dentist within the past year  Hematologic:  no easy bruising, no abnormal bleeding, no clotting disorder, no frequent epistaxis  Endocrine:  no diabetes, does not check CBG's at home     Physical Exam:   BP 136/85   Pulse 78   Temp 97.7 F (36.5 C) (Skin)   Resp 20   Ht 5\' 10"  (1.778 m)   Wt 195 lb (88.5 kg)    SpO2 96% Comment: RA  BMI 27.98 kg/m   General:    well-appearing  HEENT:  Unremarkable   Neck:   no JVD, no bruits, no adenopathy   Chest:   clear to auscultation, symmetrical breath sounds, no wheezes, no rhonchi   CV:   Irregular rate and rhythm, no murmur   Abdomen:  soft, non-tender, no masses   Extremities:  warm, well-perfused, pulses palpable, no LE edema  Rectal/GU  Deferred  Neuro:   Grossly non-focal and symmetrical throughout  Skin:   Clean and dry, no rashes, no breakdown   Diagnostic Tests:  EKG: Atrial fibrillation w/out acute ischemic changes, HR 113 (12/16/2019)     ECHOCARDIOGRAM REPORT       Patient Name:  Jorge [name] Date of Exam: 05/12/2019  Medical Rec #: [id number]     Height:    70.0 in  Accession #:  [id number]     Weight:    196.2 lb  Date of Birth: May 22, 1965     BSA:     2.07 m  Patient Age:  44 years      BP:      128/82 mmHg  Patient Gender: M         HR:      109 bpm.  Exam Location: [address]   Procedure: 2D Echo, Cardiac Doppler and Color Doppler   Indications:  I35.1    History:    Patient has prior history of Echocardiogram examinations,  most         recent 01/26/2015. CAD, AI, Arrythmias:Atrial Fibrillation;  Risk         Factors:Hypertension and Dyslipidemia.    Sonographer:  Coralyn Helling RDCS  Referring Phys: [place]    1. Left ventricular ejection fraction, by visual estimation, is 50 to  55%. The left ventricle has normal function. Left ventricular septal wall  thickness was moderately increased. Moderately increased left ventricular  posterior wall thickness. There is  no left ventricular hypertrophy.  2. Left ventricular diastolic function could not be evaluated.  3. The left ventricle has no regional wall motion abnormalities.  4. Global right ventricle has normal systolic  function.The right  ventricular size is normal. No increase in right ventricular wall  thickness.  5. Left atrial size was normal.  6. Right atrial size was normal.  7. Mild calcification of the anterior mitral valve leaflet(s).  8. The mitral valve is degenerative. Trivial mitral valve regurgitation.  No evidence of mitral stenosis.  9. The tricuspid valve is normal in structure.  10. The aortic valve is tricuspid. Aortic valve regurgitation is mild.  Mild aortic valve sclerosis without stenosis.  11. The pulmonic valve was normal in structure. Pulmonic valve  regurgitation is not visualized.  12. TR signal is inadequate for assessing pulmonary artery systolic  pressure.  13. The inferior vena cava is normal in size with greater than 50%  respiratory variability, suggesting right atrial pressure of 3 mmHg.   FINDINGS  Left Ventricle: Left ventricular ejection fraction, by visual estimation,  is 50 to 55%. The left ventricle has normal function. The left ventricle  has no regional  wall motion abnormalities. Moderately increased left  ventricular posterior wall thickness.  There is no left ventricular hypertrophy. The left ventricular diastology  could not be evaluated due to atrial fibrillation. Left ventricular  diastolic function could not be evaluated. Normal left atrial pressure.   Right Ventricle: The right ventricular size is normal. No increase in  right ventricular wall thickness. Global RV systolic function is has  normal systolic function.   Left Atrium: Left atrial size was normal in size.   Right Atrium: Right atrial size was normal in size   Pericardium: There is no evidence of pericardial effusion.   Mitral Valve: The mitral valve is degenerative in appearance. There is  mild calcification of the anterior mitral valve leaflet(s). Trivial mitral  valve regurgitation. No evidence of mitral valve stenosis by observation.   Tricuspid Valve: The tricuspid  valve is normal in structure. Tricuspid  valve regurgitation is mild.   Aortic Valve: The aortic valve is tricuspid. Aortic valve regurgitation is  mild. Mild aortic valve sclerosis is present, with no evidence of aortic  valve stenosis.   Pulmonic Valve: The pulmonic valve was normal in structure. Pulmonic valve  regurgitation is not visualized. Pulmonic regurgitation is not visualized.   Aorta: The aortic root, ascending aorta and aortic arch are all  structurally normal, with no evidence of dilitation or obstruction.   Venous: The inferior vena cava is normal in size with greater than 50%  respiratory variability, suggesting right atrial pressure of 3 mmHg.   IAS/Shunts: No atrial level shunt detected by color flow Doppler. There is  no evidence of a patent foramen ovale. No ventricular septal defect is  seen or detected. There is no evidence of an atrial septal defect.     LEFT VENTRICLE  PLAX 2D  LVIDd:     4.30 cm  LVIDs:     2.60 cm  LV PW:     1.60 cm  LV IVS:    1.30 cm  LVOT diam:   2.10 cm  LV SV:     58 ml  LV SV Index:  27.65  LVOT Area:   3.46 cm     IVC  IVC diam: 0.70 cm   LEFT ATRIUM       Index    RIGHT ATRIUM      Index  LA diam:    3.60 cm 1.74 cm/m RA Pressure: 3.00 mmHg  LA Vol (A2C):  60.9 ml 29.41 ml/m RA Area:   19.90 cm  LA Vol (A4C):  50.3 ml 24.30 ml/m RA Volume:  61.50 ml 29.70 ml/m  LA Biplane Vol: 59.9 ml 28.93 ml/m  AORTIC VALVE  LVOT Vmax:  83.80 cm/s  LVOT Vmean: 53.580 cm/s  LVOT VTI:  0.126 m    AORTA  Ao Root diam: 3.60 cm  Ao Asc diam: 3.40 cm   MITRAL VALVE            TRICUSPID VALVE  MV Area (PHT):           Estimated RAP: 3.00 mmHg    MV Decel Time: 232 msec      SHUNTS  MV E velocity: 77.02 cm/s 103 cm/s Systemic VTI: 0.13 m                   Systemic Diam: 2.10 cm     Fransico Him MD  Electronically  signed by Fransico Him MD  Signature Date/Time: 05/12/2019/9:17:02 AM      CORONARY STENT INTERVENTION  RIGHT/LEFT HEART CATH AND CORONARY ANGIOGRAPHY  Conclusion    Mid RCA lesion is 30% stenosed.  Post Atrio lesion is 30% stenosed.  Prox Cx lesion is 30% stenosed.  Ost 2nd Mrg lesion is 30% stenosed.  Prox LAD lesion is 80% stenosed.  Mid Cx lesion is 50% stenosed.  Ost 1st Diag lesion is 20% stenosed.  A drug-eluting stent was successfully placed using a STENT RESOLUTE ONYX 3.0X12.  Post intervention, there is a 0% residual stenosis.  The left ventricular systolic function is normal.  LV end diastolic pressure is normal.  The left ventricular ejection fraction is 50-55% by visual estimate.  There is no mitral valve regurgitation.   1. Severe stenosis mid LAD just after a large Diagonal branch 2. Successful PTCA/DES x 1 mid LAD 3. Moderate non-obstructive disease in the Circumflex and obtuse marginal branch 4. Mild disease in the large dominant RCA 5. Normal LV systolic function  Recommendations: Same day PCI discharge. Will increase Lipitor to 80 mg daily. Will need Lipids and LFTs in 12 weeks.  Recommend uninterrupted dual antiplatelet therapy with Aspirin 81mg  daily and Clopidogrel 75mg  daily for a minimum of 6 months (stable ischemic heart disease - Class I recommendation). If he tolerates DAPT, would continue for one full year.   Indications  Unstable angina (HCC) [I20.0 (ICD-10-CM)]  Procedural Details  Technical Details Indication: 54 yo male with history of atrial fibrillation, hyperlipidemia with dyspnea and chest pain c/w unstable angina.   Procedure: The risks, benefits, complications, treatment options, and expected outcomes were discussed with the patient. The patient and/or family concurred with the proposed plan, giving informed consent. The patient was brought to the cath lab after IV hydration was given. The patient was sedated with Versed  and Fentanyl. I changed the IV catheter in the right antecubital vein with a 5 French sheath. Right heart cath performed with a balloon tipped catheter. The right wrist was prepped and draped in a sterile fashion. 1% lidocaine was used for local anesthesia. Using the modified Seldinger access technique, a 5 French sheath was placed in the right radial artery. 3 mg Verapamil was given through the sheath. 4500 units IV heparin was given. Standard diagnostic catheters were used to perform selective coronary angiography. A pigtail catheter was used to perform a left ventricular angiogram.   PCI Note: XB LAD 3.5 guiding catheter to engage the left main. Heparin used for anti-coagulation. ACT over 300. Plavix 600 mg po x 1. Cougar IC wire advanced down the LAD. 2.5 x 12 mm balloon used to dilate the stenosis in the mid LAD. I then deployed a 3.0 x 12 mm Resolute Onyx DES in the mid LAD. The stent was post-dilated with a 3.25 x 6 mm [place] balloon x 2. Excellent flow down the Diagonal branch following the PCI of the LAD with slight plaque shift to the ostium of the Diagonal branch.    The sheath was removed from the right radial artery and a Terumo hemostasis band was applied at the arteriotomy site on the right wrist.      Estimated blood loss <50 mL.  During this procedure the patient was administered the following to achieve and maintain moderate conscious sedation: Versed 4 mg, Fentanyl 100 mcg,  while the patient's heart rate, blood pressure, and oxygen saturation were continuously monitored. The period of conscious sedation was 79 minutes, of which I was present face-to-face 100% of this time.  Medications (Filter: Administrations occurring from [id number] to 1031 on 12/23/17) (  important) Continuous medications are totaled by the amount administered until 12/23/17 1031.  midazolam (VERSED) injection (mg) Total dose:  4 mg Date/Time  Rate/Dose/Volume Action  12/23/17 0851  2 mg Given  0913  1 mg Given    0941  1 mg Given    fentaNYL (SUBLIMAZE) injection (mcg) Total dose:  100 mcg Date/Time  Rate/Dose/Volume Action  12/23/17 0852  50 mcg Given  0913  25 mcg Given  0941  25 mcg Given    lidocaine (PF) (XYLOCAINE) 1 % injection (mL) Total volume:  6 mL Date/Time  Rate/Dose/Volume Action  12/23/17 0909  2 mL Given  0915  4 mL Given    Radial Cocktail/Verapamil only (mL) Total volume:  10 mL Date/Time  Rate/Dose/Volume Action  12/23/17 0917  10 mL Given    heparin injection (Units) Total dose:  14,000 Units Date/Time  Rate/Dose/Volume Action  12/23/17 0919  4,500 Units Given  0929  6,500 Units Given  0943  3,000 Units Given    clopidogrel (PLAVIX) tablet (mg) Total dose:  600 mg Date/Time  Rate/Dose/Volume Action  12/23/17 0935  600 mg Given    famotidine (PEPCID) IVPB 20 mg premix (mg) Total dose:  Cannot be calculated* *Infusion rate not documented for continuous administration Date/Time  Rate/Dose/Volume Action  12/23/17 0939  20 mg - 100 mL/hr New Bag/Given  1007  (over 30 min) Stopped    nitroGLYCERIN 1 mg/10 mL (100 mcg/mL) - IR/CATH LAB (mcg) Total dose:  200 mcg Date/Time  Rate/Dose/Volume Action  12/23/17 1003  200 mcg Given    iopamidol (ISOVUE-370) 76 % injection (mL) Total volume:  180 mL Date/Time  Rate/Dose/Volume Action  12/23/17 1011  180 mL Given    Heparin (Porcine) in NaCl 1000-0.9 UT/500ML-% SOLN (mL) Total volume:  1,000 mL Date/Time  Rate/Dose/Volume Action  12/23/17 1011  500 mL Given  1011  500 mL Given    0.9 % sodium chloride infusion (mL/hr) Total dose:  Cannot be calculated* Dosing weight:  86.2 *Continuous medication not stopped within the calculation time range. Date/Time  Rate/Dose/Volume Action  12/23/17 1016  10 mL/hr New Bag/Given  1028  30 mL/hr Rate/Dose Change    Sedation Time  Sedation Time Physician-1: 1 hour 14 minutes 36 seconds  Contrast  Medication Name Total Dose  iopamidol (ISOVUE-370) 76 % injection 180  mL    Radiation/Fluoro  Fluoro time: 10 (min) DAP: 20254 (mGycm2) Cumulative Air Kerma: 270 (mGy)  Complications  Complications documented before study signed (12/23/2017 12:33 PM)     Log Level Complications  None Documented by Burnell Blanks, MD 12/23/2017 10:42 AM  Time Range: Intraprocedure    Coronary Findings  Diagnostic Dominance: Right Left Anterior Descending  Vessel is large.  Prox LAD lesion is 80% stenosed.  First Diagonal Branch  Vessel is moderate in size.  Ost 1st Diag lesion is 20% stenosed.  Left Circumflex  Prox Cx lesion is 30% stenosed.  Mid Cx lesion is 50% stenosed.  First Obtuse Marginal Branch  Vessel is small in size.  Second Obtuse Marginal Branch  Vessel is moderate in size.  Ost 2nd Mrg lesion is 30% stenosed.  Right Coronary Artery  Vessel is large.  Mid RCA lesion is 30% stenosed.  Right Posterior Atrioventricular Artery  Vessel is large in size.  Post Atrio lesion is 30% stenosed.  Intervention  Prox LAD lesion  Stent  CATH VISTA GUIDE 6FR XBLAD3.5 guide catheter was inserted. Pre-stent angioplasty was performed using a BALLOON  SAPPHIRE 2.5X12. A drug-eluting stent was successfully placed using a STENT RESOLUTE ONYX 3.0X12. Stent strut is well apposed. Post-stent angioplasty was performed using a BALLOON [place] EMERGE MR 3.25X6.  Post-Intervention Lesion Assessment  The intervention was successful. Pre-interventional TIMI flow is 3. Post-intervention TIMI flow is 3. No complications occurred at this lesion.  There is a 0% residual stenosis post intervention.  Wall Motion  Resting               Left Heart  Left Ventricle The left ventricular size is normal. The left ventricular systolic function is normal. LV end diastolic pressure is normal. The left ventricular ejection fraction is 50-55% by visual estimate. No regional wall motion abnormalities. There is no evidence of mitral regurgitation.  Coronary  Diagrams  Diagnostic Dominance: Right  Intervention      Impression:  Patient has chronic recurrent paroxysmal atrial fibrillation which appears to be approaching longstanding persistent atrial fibrillation that has failed pharmacologic therapy on multiple drugs as well as 2 previous attempts at catheter-based ablation.  Recent interrogation of implantable loop recorder documents that the patient remains in atrial fibrillation more than 70% of the time.  He remains symptomatic with primary complaints of exertional shortness of breath and fatigue as well as occasional chest tightness, palpitations and dizzy spells.  I have personally reviewed the patient's most recent EKG, transthoracic echocardiogram performed January of this year, and diagnostic cardiac catheterization performed in 2019 at the time of previous PCI and stenting.  There have been no signs of left ventricular or left atrial chamber enlargement on previous echoes.  Left ventricular systolic function has remained normal and there does not appear to be significant left atrial enlargement.  The patient does have at least mild aortic insufficiency although I do not appreciate a significant diastolic murmur on physical exam.  Options include continued medical therapy without any further intervention at this time, repeat attempts at catheter-based ablation, or possible surgical intervention.  Given the patient's relatively young age I think it would be reasonable to consider surgical Maze procedure.  The patient may be a good candidate for minimally invasive approach.   Plan:  I have discussed the natural history of atrial fibrillation at length with the patient and his wife in the office today.  We discussed the differences between primary lone atrial fibrillation and atrial fibrillation associated with other cardiac or respiratory problems.  We discussed treatment strategies including rate control with long-term anticoagulation,  pharmacologic therapy, catheter-based therapies, and a variety of surgical options.  We discussed the rationale for surgical Maze procedure and our local experience for patients both with and without need for concomitant surgical procedures.  We discussed alternative surgical approaches including both conventional sternotomy and minithoracotomy approach for a full Maze procedure versus other less invasive surgical approaches without use of cardiopulmonary bypass such as the convergent procedure.  Expectations for the patient's postoperative convalescence and long-term freedom from atrial fibrillation have been discussed.   Patient is interested in proceeding with surgical intervention but wants to discuss matters further with his wife and family before making a final decision.  If he ultimately decides that he would like to proceed with surgery he will need follow-up diagnostic cardiac catheterization to make sure that he has not had significant progression of his coronary artery disease.  In addition, the patient would undergo CT angiography to evaluate the feasibility of peripheral cannulation for surgery.  Patient will contact our office in the near future to let us know  whether or not he desires to proceed with surgery at this time.  All of his questions have been answered.   I spent in excess of 90 minutes during the conduct of this office consultation and >50% of this time involved direct face-to-face encounter with the patient for counseling and/or coordination of their care.    Valentina Gu. Roxy Manns, MD 01/12/2020 1:39 PM

## 2020-01-12 NOTE — Patient Instructions (Signed)
Continue all previous medications without any changes at this time  

## 2020-01-13 ENCOUNTER — Other Ambulatory Visit: Payer: Self-pay

## 2020-01-13 MED ORDER — DILTIAZEM HCL ER COATED BEADS 240 MG PO CP24
240.0000 mg | ORAL_CAPSULE | Freq: Every day | ORAL | 3 refills | Status: DC
Start: 2020-01-13 — End: 2020-04-04

## 2020-01-14 ENCOUNTER — Other Ambulatory Visit: Payer: Self-pay

## 2020-01-14 ENCOUNTER — Ambulatory Visit: Payer: BC Managed Care – PPO | Admitting: Urology

## 2020-01-14 ENCOUNTER — Encounter: Payer: Self-pay | Admitting: Urology

## 2020-01-14 VITALS — BP 129/82 | HR 90 | Ht 71.0 in | Wt 201.0 lb

## 2020-01-14 DIAGNOSIS — N200 Calculus of kidney: Secondary | ICD-10-CM | POA: Diagnosis not present

## 2020-01-14 DIAGNOSIS — R3129 Other microscopic hematuria: Secondary | ICD-10-CM | POA: Diagnosis not present

## 2020-01-14 NOTE — Patient Instructions (Signed)

## 2020-01-14 NOTE — Progress Notes (Signed)
01/14/2020 8:56 AM   Jorge [name] August 25, 1965 [id number]  Referring provider: Jerrol Banana., MD [address] [place],  [place] [address]  Chief Complaint  Patient presents with  . Follow-up    HPI:  F/u -   1 -  kidney stones. He passed a small stone in 2018. F/u May 2018 KUB revealed small bilateral calculi. A renal US was normal June 2018. He noted some red urine and right flank pain which resolved. He did not see a stone pass. 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   H/o stones s/p lithotripsy 2005 and 2013 with Dr. Olena Heckle and Dr. Jacqlyn Larsen.   2 - MH - on UA 3-10 rbc 08/21. CT benign as above. He gets yearly PSA and DRE with PCP. PSA was 1.04 Mar 2016 and 1.7 in 2020.   He is on Xarelto for A Fib.  He returns and has no pain or hematuria. CT as above.     PMH: Past Medical History:  Diagnosis Date  . Allergic rhinitis, seasonal   . Anticoagulant long-term use managed by cardiology   asa and xarelto  . Bilateral nephrolithiasis    urologist--- dr Junious Silk ([place] office)  . Coronary artery disease cardiologist--- dr Angelena Form   nuclear stress test 10-18-2016 w/ normal perfusion;  cardiac cath 12-23-2017, PCI w/ DES to midLAD and mild RCA/ moderate CFx diease nonobstructive , normal LVF;  ETT 07-13-2019 negative for ischemia  . History of kidney stones   . Hyperlipidemia   . Hypertension    followed by pcp and cardiology  . OA (osteoarthritis)    knees  . OSA on CPAP    12-31-2019  per pt uses cpap every night  . PAF (paroxysmal atrial fibrillation) ([place]) 2011   followed by dr Rayann Heman (ep)---  first dx 2011;  s/p DCCV's and ablation x2 in 2015 and last one 03/ 2018, and s/p loop recorder 05/ 2021;   failed multaq, flecainide, and amiodarone  . Right knee meniscal tear   . S/P ablation of atrial fibrillation followed by dr allred   x3  last one 2018  . S/P drug eluting coronary stent placement  12/23/2017   x1  to midLAD  . SOB (shortness of breath)    12-31-2019  per pt only when he has atrial fib , otherwise no issues with sob with activities  . Status post placement of implantable loop recorder 09/08/2019   followed by dr allred    Surgical History: Past Surgical History:  Procedure Laterality Date  . APPENDECTOMY  1975  . ATRIAL FIBRILLATION ABLATION N/A 11/02/2013   PVI by Dr Rayann Heman  . ATRIAL FIBRILLATION ABLATION N/A 03/29/2014   PVI and CTI by Dr Rayann Heman  . ATRIAL FIBRILLATION ABLATION N/A 07/04/2016   Procedure: Atrial Fibrillation Ablation;  Surgeon: Thompson Grayer, MD;  Location: [facility];  Service: Cardiovascular;  Laterality: N/A;  . CARDIOVERSION N/A 10/15/2013   Procedure: CARDIOVERSION;  Surgeon: Fay Records, MD;  Location: [facility];  Service: Cardiovascular;  Laterality: N/A;  . COLONOSCOPY  2016  . CORONARY STENT INTERVENTION N/A 12/23/2017   Procedure: CORONARY STENT INTERVENTION;  Surgeon: Burnell Blanks, MD;  Location: [facility];  Service: Cardiovascular;  Laterality: N/A;  . EXTRACORPOREAL SHOCK WAVE LITHOTRIPSY  2005;  2013  . implantable loop recorder placement  09/08/2019   (done in office)   Medtronic Reveal Linq model LNQ 22 (  SN [id number]) implantable loop recorder implanted in office by Dr Rayann Heman for afib management  . INGUINAL HERNIA REPAIR Left 05/31/2015   Procedure: HERNIA REPAIR INGUINAL ADULT;  Surgeon: Robert Bellow, MD;  Location: [facility];  Service: General;  Laterality: Left;  . KNEE ARTHROSCOPY Left 2009   @[facility]   . KNEE ARTHROSCOPY WITH MEDIAL MENISECTOMY Right 01/04/2020   Procedure: KNEE ARTHROSCOPY WITH PARTIAL MEDIAL MENISECTOMY;  Surgeon: Nicholes Stairs, MD;  Location: [facility];  Service: Orthopedics;  Laterality: Right;  . RHINOPLASTY  2006    @[facility]   . RIGHT/LEFT HEART CATH AND CORONARY ANGIOGRAPHY N/A 12/23/2017   Procedure: RIGHT/LEFT HEART CATH AND CORONARY ANGIOGRAPHY;  Surgeon:  Burnell Blanks, MD;  Location: [facility];  Service: Cardiovascular;  Laterality: N/A;  . ROBOT ASSISTED INGUINAL HERNIA REPAIR Left 05-01-2013   @[facility]   . TEE WITHOUT CARDIOVERSION N/A 11/01/2013   Procedure: TRANSESOPHAGEAL ECHOCARDIOGRAM (TEE);  Surgeon: Candee Furbish, MD;  [facility];  Service: Cardiovascular;  Laterality: N/A;  . TEE WITHOUT CARDIOVERSION N/A 03/28/2014   Procedure: TRANSESOPHAGEAL ECHOCARDIOGRAM (TEE);  Surgeon: Larey Dresser, MD;  Location: [facility];  Service: Cardiovascular;  Laterality: N/A;    Home Medications:  Allergies as of 01/14/2020      Reactions   Hydrocodone Nausea And Vomiting   Oxycodone Nausea And Vomiting      Medication List       Accurate as of January 14, 2020  8:56 AM. If you have any questions, ask your nurse or doctor.        STOP taking these medications   HYDROcodone-acetaminophen 5-325 MG tablet Commonly known as: NORCO/VICODIN Stopped by: Festus Aloe, MD   ondansetron 4 MG disintegrating tablet Commonly known as: Zofran ODT Stopped by: Festus Aloe, MD     TAKE these medications   aspirin 81 MG EC tablet Take 1 tablet by mouth daily.   azelastine 0.1 % nasal spray Commonly known as: ASTELIN Place 1 spray into both nostrils 2 (two) times daily. What changed:   when to take this  reasons to take this   diltiazem 240 MG 24 hr capsule Commonly known as: CARDIZEM CD Take 1 capsule (240 mg total) by mouth daily.   dofetilide 500 MCG capsule Commonly known as: TIKOSYN Take 1 capsule (500 mcg total) by mouth 2 (two) times daily.   fluticasone 50 MCG/ACT nasal spray Commonly known as: FLONASE Place 2 sprays into both nostrils daily.   losartan 50 MG tablet Commonly known as: COZAAR Take 1 tablet (50 mg total) by mouth daily.   montelukast 10 MG tablet Commonly known as: SINGULAIR Take 1 tablet (10 mg total) by mouth at bedtime.   MULTIVITAMIN MEN PO Take by mouth daily.    rosuvastatin 40 MG tablet Commonly known as: CRESTOR Take 1 tablet (40 mg total) by mouth daily. What changed: when to take this   tamsulosin 0.4 MG Caps capsule Commonly known as: FLOMAX Take 1 capsule (0.4 mg total) by mouth daily after supper. What changed:   when to take this  reasons to take this   traMADol 50 MG tablet Commonly known as: ULTRAM Take 1 tablet (50 mg total) by mouth every 6 (six) hours as needed.   Xarelto 20 MG Tabs tablet Generic drug: rivaroxaban Take 1 tablet (20 mg total) by mouth daily with supper. What changed: See the new instructions.       Allergies:  Allergies  Allergen Reactions  . Hydrocodone Nausea  And Vomiting  . Oxycodone Nausea And Vomiting    Family History: Family History  Problem Relation Age of Onset  . Cancer Mother        breast, cause of death  . Heart attack Maternal Grandfather 53       Cause of death  . Hypertension Maternal Grandfather   . Heart failure Brother   . CVA Paternal Grandmother        cause of death  . Atrial fibrillation Maternal Grandmother   . Colon polyps Other        Fam Hx  . Kidney cancer Neg Hx   . Bladder Cancer Neg Hx   . Prostate cancer Neg Hx     Social History:  reports that he has never smoked. He has never used smokeless tobacco. He reports current alcohol use. He reports that he does not use drugs.   Physical Exam: BP 129/82 (BP Location: Left Arm, Patient Position: Sitting, Cuff Size: Normal)   Pulse 90   Ht 5\' 11"  (1.803 m)   Wt 201 lb (91.2 kg)   BMI 28.03 kg/m   Constitutional:  Alert and oriented, No acute distress. HEENT: [place] AT, moist mucus membranes.  Trachea midline, no masses. Cardiovascular: No clubbing, cyanosis, or edema. Respiratory: Normal respiratory effort, no increased work of breathing. GI: Abdomen is soft, nontender, nondistended, no abdominal masses GU: No CVA tenderness Lymph: No cervical or inguinal lymphadenopathy. Skin: No rashes, bruises or  suspicious lesions. Neurologic: Grossly intact, no focal deficits, moving all 4 extremities. Psychiatric: Normal mood and affect.  Laboratory Data: Lab Results  Component Value Date   WBC 8.8 04/06/2019   HGB 12.6 (L) 01/04/2020   HCT 37.0 (L) 01/04/2020   MCV 90 04/06/2019   PLT 245 04/06/2019    Lab Results  Component Value Date   CREATININE 0.70 01/04/2020    Lab Results  Component Value Date   PSA 1.3 03/27/2017    Lab Results  Component Value Date   TESTOSTERONE 711 03/27/2017    No results found for: HGBA1C  Urinalysis    Component Value Date/Time   APPEARANCEUR Clear 12/17/2019 0913   GLUCOSEU Negative 12/17/2019 0913   BILIRUBINUR Negative 12/17/2019 0913   PROTEINUR Negative 12/17/2019 0913   UROBILINOGEN 0.2 04/06/2019 1055   NITRITE Negative 12/17/2019 0913   LEUKOCYTESUR Negative 12/17/2019 0913    Lab Results  Component Value Date   LABMICR See below: 12/17/2019   WBCUA 0-5 12/17/2019   LABEPIT 0-10 12/17/2019   MUCUS Present (A) 12/17/2019   BACTERIA None seen 12/17/2019    Pertinent Imaging: n/a Results for orders placed during the hospital encounter of 09/18/16  Abdomen 1 view (KUB)  Narrative CLINICAL DATA:  Kidney stones.  Flank pain  EXAM: ABDOMEN - 1 VIEW  COMPARISON:  KUB 11/06/2011  FINDINGS: Moderate stool in the right colon overlying the right kidney. This obscures evaluation for renal calculi on the right. There is a probable small renal calculus on the right measuring 2 x 3 mm.  Small punctate renal calculi on the left unchanged from the prior study.  Vascular calcifications in the pelvis.  Normal bowel gas pattern.  No skeletal abnormality.  IMPRESSION: Small bilateral renal calculi, partially obscured by stool on the right.   Electronically Signed By: Franchot Gallo M.D. On: 09/18/2016 14:33  No results found for this or any previous visit.  No results found for this or any previous visit.  No  results found for this  or any previous visit.  Results for orders placed during the hospital encounter of 10/02/16  US Renal  Narrative CLINICAL DATA:  Kidney stones, history hypertension  EXAM: RENAL / URINARY TRACT ULTRASOUND COMPLETE  COMPARISON:  None ; correlation abdominal radiograph 09/18/2016  FINDINGS: Right Kidney:  Length: 10.9 cm. Normal cortical thickness and echogenicity. 5 mm echogenic focus at mid RIGHT kidney with questionable shadowing. Additional 4 mm nonshadowing echogenic focus at inferior pole RIGHT kidney, nonspecific. Small cyst at upper pole 12 x 9 x 12 mm. No additional mass or hydronephrosis. No perinephric fluid.  Left Kidney:  Length: 12.5 cm. Normal cortical thickness and echogenicity. Nonspecific 4 mm echogenic focus mid LEFT kidney without definite shadowing. No solid mass, hydronephrosis or perinephric fluid.  Bladder:  Normal appearance.  BILATERAL ureteral jets noted.  IMPRESSION: No evidence of worrisome renal mass or hydronephrosis.  Small cyst RIGHT kidney 12 mm diameter.  Echogenic foci in both kidneys without definite shadowing, questionably corresponding to tiny calculi seen on recent abdominal radiograph.   Electronically Signed By: Lavonia Dana M.D. On: 10/02/2016 17:35  No results found for this or any previous visit.  No results found for this or any previous visit.  No results found for this or any previous visit.   Assessment & Plan:    Kidney stones - we discussed the nature r/b/a to stone surveillance and diet changes to prevent stones.   MH - resolved and may have been a stone event. Will monitor.   No follow-ups on file.  Festus Aloe, MD  [facility] [address], [place], [place] [address] [phone]

## 2020-01-17 LAB — URINALYSIS, COMPLETE
Bilirubin, UA: NEGATIVE
Glucose, UA: NEGATIVE
Ketones, UA: NEGATIVE
Leukocytes,UA: NEGATIVE
Nitrite, UA: NEGATIVE
Protein,UA: NEGATIVE
Specific Gravity, UA: 1.025 (ref 1.005–1.030)
Urobilinogen, Ur: 1 mg/dL (ref 0.2–1.0)
pH, UA: 6 (ref 5.0–7.5)

## 2020-01-17 LAB — MICROSCOPIC EXAMINATION: Bacteria, UA: NONE SEEN

## 2020-01-23 LAB — CUP PACEART REMOTE DEVICE CHECK
Date Time Interrogation Session: 20210923064943
Implantable Pulse Generator Implant Date: 20210512

## 2020-01-24 ENCOUNTER — Other Ambulatory Visit: Payer: Self-pay | Admitting: *Deleted

## 2020-01-24 ENCOUNTER — Ambulatory Visit (INDEPENDENT_AMBULATORY_CARE_PROVIDER_SITE_OTHER): Payer: BC Managed Care – PPO | Admitting: Emergency Medicine

## 2020-01-24 ENCOUNTER — Telehealth: Payer: Self-pay | Admitting: *Deleted

## 2020-01-24 DIAGNOSIS — Z01812 Encounter for preprocedural laboratory examination: Secondary | ICD-10-CM

## 2020-01-24 DIAGNOSIS — I480 Paroxysmal atrial fibrillation: Secondary | ICD-10-CM

## 2020-01-24 DIAGNOSIS — Z01818 Encounter for other preprocedural examination: Secondary | ICD-10-CM

## 2020-01-24 NOTE — Telephone Encounter (Signed)
Called and spoke with patient. He would like to schedule for 02/02/20 for TEE and cath.  He is aware I will schedule these and call him back with details/plan on Wed 01/26/20.

## 2020-01-24 NOTE — Telephone Encounter (Signed)
Burnell Blanks, MD  Rexene Alberts, MD; Laury Deep, RN; Rodman Key, RN We can try and make that work.  Gerald Stabs       Previous Messages   ----- Message -----  From: Rexene Alberts, MD  Sent: 01/24/2020  3:34 PM EDT  To: Laury Deep, RN, Burnell Blanks, MD, *   That would be ideal  ----- Message -----  From: Laury Deep, RN  Sent: 01/24/2020  2:12 PM EDT  To: Rexene Alberts, MD, *   Dr. Roxy Manns,   Patient mentioned you also wanted a TEE. Can he get this done day of cath?  If so, Gerald Stabs: can you nurse also set this up to happen the same day?  Thanks-Ryan

## 2020-01-26 ENCOUNTER — Encounter: Payer: Self-pay | Admitting: *Deleted

## 2020-01-26 NOTE — Progress Notes (Signed)
Carelink Summary Report / Loop Recorder 

## 2020-01-26 NOTE — Telephone Encounter (Signed)
Patient has been scheduled for TEE with Dr. Gasper Sells at 10:00 and R/L heart cath at 1:00 with Dr. Angelena Form on 02/02/20. He will come to office for labs and then to Covid screening on 10/4.  Will pick up instruction letter at that time.

## 2020-01-27 ENCOUNTER — Encounter: Payer: Self-pay | Admitting: *Deleted

## 2020-01-27 NOTE — Addendum Note (Signed)
Addended by: Lauree Chandler D on: 01/27/2020 10:46 AM   Modules accepted: Orders, SmartSet

## 2020-01-31 ENCOUNTER — Other Ambulatory Visit: Payer: BC Managed Care – PPO | Admitting: *Deleted

## 2020-01-31 ENCOUNTER — Other Ambulatory Visit: Payer: Self-pay

## 2020-01-31 ENCOUNTER — Other Ambulatory Visit (HOSPITAL_COMMUNITY)
Admission: RE | Admit: 2020-01-31 | Discharge: 2020-01-31 | Disposition: A | Discharge status: Home / Self Care | Payer: BC Managed Care – PPO | Source: Ambulatory Visit | Attending: Cardiovascular Disease | Admitting: Cardiovascular Disease

## 2020-01-31 DIAGNOSIS — Z20822 Contact with and (suspected) exposure to covid-19: Secondary | ICD-10-CM | POA: Insufficient documentation

## 2020-01-31 DIAGNOSIS — Z01812 Encounter for preprocedural laboratory examination: Secondary | ICD-10-CM

## 2020-01-31 LAB — SARS CORONAVIRUS 2 (TAT 6-24 HRS): SARS Coronavirus 2: NEGATIVE

## 2020-01-31 LAB — CBC
Hematocrit: 38.1 % (ref 37.5–51.0)
Hemoglobin: 12.9 g/dL — ABNORMAL LOW (ref 13.0–17.7)
MCH: 30.3 pg (ref 26.6–33.0)
MCHC: 33.9 g/dL (ref 31.5–35.7)
MCV: 89 fL (ref 79–97)
Platelets: 254 10*3/uL (ref 150–450)
RBC: 4.26 x10E6/uL (ref 4.14–5.80)
RDW: 13.9 % (ref 11.6–15.4)
WBC: 5.8 10*3/uL (ref 3.4–10.8)

## 2020-01-31 LAB — BASIC METABOLIC PANEL
BUN/Creatinine Ratio: 20 (ref 9–20)
BUN: 16 mg/dL (ref 6–24)
CO2: 28 mmol/L (ref 20–29)
Calcium: 9.4 mg/dL (ref 8.7–10.2)
Chloride: 105 mmol/L (ref 96–106)
Creatinine, Ser: 0.8 mg/dL (ref 0.76–1.27)
GFR calc Af Amer: 117 mL/min/{1.73_m2} (ref >59)
GFR calc non Af Amer: 101 mL/min/{1.73_m2} (ref >59)
Glucose: 105 mg/dL — ABNORMAL HIGH (ref 65–99)
Potassium: 4.2 mmol/L (ref 3.5–5.2)
Sodium: 140 mmol/L (ref 134–144)

## 2020-02-01 ENCOUNTER — Telehealth: Payer: Self-pay | Admitting: *Deleted

## 2020-02-01 MED ORDER — NITROGLYCERIN 1 MG/10 ML FOR IR/CATH LAB
INTRA_ARTERIAL | Status: AC
  Filled 2020-02-01: qty 10

## 2020-02-01 NOTE — Telephone Encounter (Signed)
Pt contacted pre-catheterization scheduled at [facility] for: Wednesday February 02, 2020 1 PM  Verified arrival time and place: [facility]) at: 9 AM/TEE 10 AM   Nothing to eat or drink after midnight prior to procedures, may have sips of water to take medications.   Hold: Xarelto-pt states last dose 01/29/20 knows to hold until post procedure  Except hold medications AM meds can be  taken pre-cath with sips of water including: ASA 81 mg   Confirmed patient has responsible adult to drive home post procedure and be with patient first 24 hours after arriving home: yes  You are allowed ONE visitor in the waiting room during the time you are at the hospital for your procedure. Both you and your visitor must wear a mask once you enter the hospital.       COVID-19 Pre-Screening Questions:   In the past 14 days have you had a new cough, new headache, new nasal congestion, fever (100.4 or greater) unexplained body aches, new sore throat, or sudden loss of taste or sense of smell? no  In the past 14 days have you been around anyone with known Covid 19? no   Have you been vaccinated for COVID-19? Yes, see immunization history       Reviewed procedure/mask/visitor instructions, COVID-19 questions with patient.

## 2020-02-02 ENCOUNTER — Encounter (HOSPITAL_COMMUNITY)
Admission: RE | Disposition: A | Discharge status: Home / Self Care | Payer: BC Managed Care – PPO | Source: Home / Self Care | Attending: Internal Medicine

## 2020-02-02 ENCOUNTER — Other Ambulatory Visit: Payer: Self-pay

## 2020-02-02 ENCOUNTER — Ambulatory Visit (HOSPITAL_COMMUNITY)
Admission: RE | Admit: 2020-02-02 | Discharge: 2020-02-02 | Disposition: A | Discharge status: Home / Self Care | Payer: BC Managed Care – PPO | Attending: Internal Medicine | Admitting: Internal Medicine

## 2020-02-02 ENCOUNTER — Encounter (HOSPITAL_COMMUNITY)
Admission: RE | Disposition: A | Discharge status: Home / Self Care | Payer: Self-pay | Source: Home / Self Care | Attending: Internal Medicine

## 2020-02-02 ENCOUNTER — Ambulatory Visit (HOSPITAL_BASED_OUTPATIENT_CLINIC_OR_DEPARTMENT_OTHER): Payer: BC Managed Care – PPO

## 2020-02-02 ENCOUNTER — Encounter (HOSPITAL_COMMUNITY): Payer: Self-pay | Admitting: Internal Medicine

## 2020-02-02 DIAGNOSIS — Z01818 Encounter for other preprocedural examination: Secondary | ICD-10-CM

## 2020-02-02 DIAGNOSIS — I4891 Unspecified atrial fibrillation: Secondary | ICD-10-CM

## 2020-02-02 DIAGNOSIS — Z955 Presence of coronary angioplasty implant and graft: Secondary | ICD-10-CM | POA: Insufficient documentation

## 2020-02-02 DIAGNOSIS — I480 Paroxysmal atrial fibrillation: Secondary | ICD-10-CM | POA: Diagnosis present

## 2020-02-02 DIAGNOSIS — E785 Hyperlipidemia, unspecified: Secondary | ICD-10-CM | POA: Insufficient documentation

## 2020-02-02 DIAGNOSIS — Z7901 Long term (current) use of anticoagulants: Secondary | ICD-10-CM | POA: Insufficient documentation

## 2020-02-02 DIAGNOSIS — I10 Essential (primary) hypertension: Secondary | ICD-10-CM | POA: Insufficient documentation

## 2020-02-02 DIAGNOSIS — I340 Nonrheumatic mitral (valve) insufficiency: Secondary | ICD-10-CM

## 2020-02-02 DIAGNOSIS — Z7982 Long term (current) use of aspirin: Secondary | ICD-10-CM | POA: Diagnosis not present

## 2020-02-02 DIAGNOSIS — Z79899 Other long term (current) drug therapy: Secondary | ICD-10-CM | POA: Insufficient documentation

## 2020-02-02 DIAGNOSIS — I2510 Atherosclerotic heart disease of native coronary artery without angina pectoris: Secondary | ICD-10-CM

## 2020-02-02 DIAGNOSIS — I25110 Atherosclerotic heart disease of native coronary artery with unstable angina pectoris: Secondary | ICD-10-CM | POA: Diagnosis not present

## 2020-02-02 DIAGNOSIS — G4733 Obstructive sleep apnea (adult) (pediatric): Secondary | ICD-10-CM | POA: Insufficient documentation

## 2020-02-02 HISTORY — PX: RIGHT/LEFT HEART CATH AND CORONARY ANGIOGRAPHY: CATH118266

## 2020-02-02 HISTORY — PX: TEE WITHOUT CARDIOVERSION: SHX5443

## 2020-02-02 LAB — POCT I-STAT 7, (LYTES, BLD GAS, ICA,H+H)
Acid-base deficit: 2 mmol/L (ref 0.0–2.0)
Bicarbonate: 22.8 mmol/L (ref 20.0–28.0)
Calcium, Ion: 1.18 mmol/L (ref 1.15–1.40)
HCT: 37 % — ABNORMAL LOW (ref 39.0–52.0)
Hemoglobin: 12.6 g/dL — ABNORMAL LOW (ref 13.0–17.0)
O2 Saturation: 93 %
Potassium: 3.8 mmol/L (ref 3.5–5.1)
Sodium: 143 mmol/L (ref 135–145)
TCO2: 24 mmol/L (ref 22–32)
pCO2 arterial: 39 mmHg (ref 32.0–48.0)
pH, Arterial: 7.376 (ref 7.350–7.450)
pO2, Arterial: 68 mmHg — ABNORMAL LOW (ref 83.0–108.0)

## 2020-02-02 LAB — POCT I-STAT EG7
Acid-base deficit: 1 mmol/L (ref 0.0–2.0)
Bicarbonate: 24.4 mmol/L (ref 20.0–28.0)
Calcium, Ion: 1.27 mmol/L (ref 1.15–1.40)
HCT: 38 % — ABNORMAL LOW (ref 39.0–52.0)
Hemoglobin: 12.9 g/dL — ABNORMAL LOW (ref 13.0–17.0)
O2 Saturation: 69 %
Potassium: 4 mmol/L (ref 3.5–5.1)
Sodium: 142 mmol/L (ref 135–145)
TCO2: 26 mmol/L (ref 22–32)
pCO2, Ven: 42.8 mmHg — ABNORMAL LOW (ref 44.0–60.0)
pH, Ven: 7.364 (ref 7.250–7.430)
pO2, Ven: 37 mmHg (ref 32.0–45.0)

## 2020-02-02 SURGERY — ECHOCARDIOGRAM, TRANSESOPHAGEAL
Anesthesia: Moderate Sedation

## 2020-02-02 SURGERY — RIGHT/LEFT HEART CATH AND CORONARY ANGIOGRAPHY
Anesthesia: LOCAL

## 2020-02-02 MED ORDER — FENTANYL CITRATE (PF) 100 MCG/2ML IJ SOLN
INTRAMUSCULAR | Status: DC | PRN
Start: 2020-02-02 — End: 2020-02-02
  Administered 2020-02-02: 50 ug via INTRAVENOUS

## 2020-02-02 MED ORDER — LIDOCAINE HCL (PF) 1 % IJ SOLN
INTRAMUSCULAR | Status: DC | PRN
  Administered 2020-02-02: 5 mL

## 2020-02-02 MED ORDER — SODIUM CHLORIDE 0.9% FLUSH: 3.0000 mL | INTRAVENOUS | Status: DC | PRN

## 2020-02-02 MED ORDER — SODIUM CHLORIDE 0.9 % IV SOLN: 250.0000 mL | INTRAVENOUS | Status: DC | PRN

## 2020-02-02 MED ORDER — ONDANSETRON HCL 4 MG/2ML IJ SOLN: 4.0000 mg | Freq: Four times a day (QID) | INTRAMUSCULAR | Status: DC | PRN

## 2020-02-02 MED ORDER — ACETAMINOPHEN 325 MG PO TABS: 650.0000 mg | ORAL_TABLET | ORAL | Status: DC | PRN

## 2020-02-02 MED ORDER — MIDAZOLAM HCL 2 MG/2ML IJ SOLN
INTRAMUSCULAR | Status: AC
  Filled 2020-02-02: qty 2

## 2020-02-02 MED ORDER — FENTANYL CITRATE (PF) 100 MCG/2ML IJ SOLN
INTRAMUSCULAR | Status: AC
  Filled 2020-02-02: qty 2

## 2020-02-02 MED ORDER — VERAPAMIL HCL 2.5 MG/ML IV SOLN
INTRAVENOUS | Status: AC
  Filled 2020-02-02: qty 2

## 2020-02-02 MED ORDER — IOHEXOL 350 MG/ML SOLN
INTRAVENOUS | Status: DC | PRN
  Administered 2020-02-02: 50 mL

## 2020-02-02 MED ORDER — SODIUM CHLORIDE 0.9% FLUSH: 3.0000 mL | Freq: Two times a day (BID) | INTRAVENOUS | Status: DC

## 2020-02-02 MED ORDER — ASPIRIN 81 MG PO CHEW: 81.0000 mg | CHEWABLE_TABLET | ORAL | Status: DC

## 2020-02-02 MED ORDER — SODIUM CHLORIDE 0.9 % WEIGHT BASED INFUSION: 1.0000 mL/kg/h | INTRAVENOUS | Status: DC

## 2020-02-02 MED ORDER — SODIUM CHLORIDE 0.9 % IV SOLN: INTRAVENOUS | Status: AC

## 2020-02-02 MED ORDER — HYDRALAZINE HCL 20 MG/ML IJ SOLN: 10.0000 mg | INTRAMUSCULAR | Status: DC | PRN

## 2020-02-02 MED ORDER — SODIUM CHLORIDE 0.9 % WEIGHT BASED INFUSION
3.0000 mL/kg/h | INTRAVENOUS | Status: AC
  Administered 2020-02-02: 3 mL/kg/h via INTRAVENOUS

## 2020-02-02 MED ORDER — MIDAZOLAM HCL 2 MG/2ML IJ SOLN
INTRAMUSCULAR | Status: DC | PRN
  Administered 2020-02-02: 2 mg via INTRAVENOUS

## 2020-02-02 MED ORDER — SODIUM CHLORIDE 0.9 % IV SOLN: INTRAVENOUS | Status: DC

## 2020-02-02 MED ORDER — DIPHENHYDRAMINE HCL 50 MG/ML IJ SOLN
INTRAMUSCULAR | Status: AC
  Filled 2020-02-02: qty 1

## 2020-02-02 MED ORDER — BUTAMBEN-TETRACAINE-BENZOCAINE 2-2-14 % EX AERO
INHALATION_SPRAY | CUTANEOUS | Status: DC | PRN
  Administered 2020-02-02: 2 via TOPICAL

## 2020-02-02 MED ORDER — HEPARIN (PORCINE) IN NACL 1000-0.9 UT/500ML-% IV SOLN
INTRAVENOUS | Status: DC | PRN
  Administered 2020-02-02 (×2): 500 mL

## 2020-02-02 MED ORDER — MIDAZOLAM HCL (PF) 5 MG/ML IJ SOLN
INTRAMUSCULAR | Status: AC
  Filled 2020-02-02: qty 2

## 2020-02-02 MED ORDER — DIPHENHYDRAMINE HCL 50 MG/ML IJ SOLN
INTRAMUSCULAR | Status: DC | PRN
  Administered 2020-02-02 (×2): 12.5 mg via INTRAVENOUS

## 2020-02-02 MED ORDER — LIDOCAINE HCL (PF) 1 % IJ SOLN
INTRAMUSCULAR | Status: AC
  Filled 2020-02-02: qty 30

## 2020-02-02 MED ORDER — HEPARIN (PORCINE) IN NACL 1000-0.9 UT/500ML-% IV SOLN
INTRAVENOUS | Status: AC
  Filled 2020-02-02: qty 1000

## 2020-02-02 MED ORDER — FENTANYL CITRATE (PF) 100 MCG/2ML IJ SOLN
INTRAMUSCULAR | Status: AC
  Filled 2020-02-02: qty 4

## 2020-02-02 MED ORDER — VERAPAMIL HCL 2.5 MG/ML IV SOLN
INTRAVENOUS | Status: DC | PRN
  Administered 2020-02-02: 10 mL via INTRA_ARTERIAL

## 2020-02-02 MED ORDER — LABETALOL HCL 5 MG/ML IV SOLN: 10.0000 mg | INTRAVENOUS | Status: DC | PRN

## 2020-02-02 MED ORDER — FENTANYL CITRATE (PF) 100 MCG/2ML IJ SOLN
INTRAMUSCULAR | Status: DC | PRN
  Administered 2020-02-02: 12.5 ug via INTRAVENOUS
  Administered 2020-02-02: 25 ug via INTRAVENOUS
  Administered 2020-02-02 (×3): 12.5 ug via INTRAVENOUS
  Administered 2020-02-02: 25 ug via INTRAVENOUS

## 2020-02-02 MED ORDER — HEPARIN SODIUM (PORCINE) 1000 UNIT/ML IJ SOLN
INTRAMUSCULAR | Status: DC | PRN
  Administered 2020-02-02: 4500 [IU] via INTRAVENOUS

## 2020-02-02 MED ORDER — MIDAZOLAM HCL (PF) 5 MG/ML IJ SOLN
INTRAMUSCULAR | Status: DC | PRN
  Administered 2020-02-02 (×4): 1 mg via INTRAVENOUS
  Administered 2020-02-02: 2 mg via INTRAVENOUS
  Administered 2020-02-02: 1 mg via INTRAVENOUS

## 2020-02-02 SURGICAL SUPPLY — 12 items

## 2020-02-02 NOTE — Progress Notes (Signed)
  Echocardiogram 2D Echocardiogram has been performed.  Jorge [name] 02/02/2020, 11:16 AM

## 2020-02-02 NOTE — CV Procedure (Addendum)
    TRANSESOPHAGEAL ECHOCARDIOGRAM   NAME:  Jorge [name]    MRN: [id number] DOB:  27-Sep-1965    ADMIT DATE: 02/02/2020  INDICATIONS: Evaluation prior to MAZE procedure  PROCEDURE:   Informed consent was obtained prior to the procedure. The risks, benefits and alternatives for the procedure were discussed and the patient comprehended these risks.  Risks include, but are not limited to, cough, sore throat, vomiting, nausea, somnolence, esophageal and stomach trauma or perforation, bleeding, low blood pressure, aspiration, pneumonia, infection, trauma to the teeth and death.    Procedural time out performed. The oropharynx was anesthetized with topical 1% cetacaine.    Anesthesia was administered by Dr. Gasper Sells.  The patient was administered a total of Versed 7 mg, 25 benadryl and Fentanyl 100 mcg to achieve and maintain moderate conscious sedation.  The patient's heart rate, blood pressure, and oxygen saturation are monitored continuously during the procedure. The period of conscious sedation is 39 minutes, of which I was present face-to-face 100% of this time.   The transesophageal probe was inserted in the esophagus and stomach without difficulty and multiple views were obtained.   COMPLICATIONS:    There were no immediate complications.  Patient notes mild sore throat post procedure.  Discussed red flag symptoms consistent with esophageal damage to be aware of.    KEY FINDINGS:  1. Patent left atrial appendage. 2. Full report to follow. 3. Further management per primary team.   Rudean Haskell, MD [place]  10:52 AM

## 2020-02-02 NOTE — Interval H&P Note (Signed)
History and Physical Interval Note:  02/02/2020 1:30 PM  Jorge [name]  has presented today for surgery, with the diagnosis of cad - afib.  The various methods of treatment have been discussed with the patient and family. After consideration of risks, benefits and other options for treatment, the patient has consented to  Procedure(s): RIGHT/LEFT HEART CATH AND CORONARY ANGIOGRAPHY (N/A) as a surgical intervention.  The patient's history has been reviewed, patient examined, no change in status, stable for surgery.  I have reviewed the patient's chart and labs.  Questions were answered to the patient's satisfaction.    Cath Lab Visit (complete for each Cath Lab visit)  Clinical Evaluation Leading to the Procedure:   ACS: No.  Non-ACS:    Anginal Classification: No Symptoms  Anti-ischemic medical therapy: Minimal Therapy (1 class of medications)  Non-Invasive Test Results: No non-invasive testing performed  Prior CABG: No previous CABG        Lauree Chandler

## 2020-02-02 NOTE — H&P (Signed)
Cardiology Admission History and Physical:   Patient ID: Jorge [name] MRN: [id number]; DOB: 1965-10-11   Admission date: 02/02/2020  Primary Care Provider: Jerrol Banana., MD [facility] Cardiologist: Lauree Chandler, MD  [facility] Electrophysiologist:  Thompson Grayer, MD   Chief Complaint: TEE prior to surgery  Patient Profile:   Jorge [name] is a 54 y.o. male with persistent AF prior to MAZE  History of Present Illness:   Jorge [name] has no complaints this am.  No SOB, DOE, swallowing difficulties.  Has had prior TEE at [facility] without issues prior to DCCV.  No loose teeth.  Has had problems with sedation but tolerated fentanyl and versed without issues.   Past Medical History:  Diagnosis Date  . Allergic rhinitis, seasonal   . Anticoagulant long-term use managed by cardiology   asa and xarelto  . Bilateral nephrolithiasis    urologist--- dr Junious Silk ([place] [facility])  . Coronary artery disease cardiologist--- dr Angelena Form   nuclear stress test 10-18-2016 w/ normal perfusion;  cardiac cath 12-23-2017, PCI w/ DES to midLAD and mild RCA/ moderate CFx diease nonobstructive , normal LVF;  ETT 07-13-2019 negative for ischemia  . History of kidney stones   . Hyperlipidemia   . Hypertension    followed by pcp and cardiology  . OA (osteoarthritis)    knees  . OSA on CPAP    12-31-2019  per pt uses cpap every night  . PAF (paroxysmal atrial fibrillation) ([place]) 2011   followed by dr Rayann Heman (ep)---  first dx 2011;  s/p DCCV's and ablation x2 in 2015 and last one 03/ 2018, and s/p loop recorder 05/ 2021;   failed multaq, flecainide, and amiodarone  . Right knee meniscal tear   . S/P ablation of atrial fibrillation followed by dr allred   x3  last one 2018  . S/P drug eluting coronary stent placement 12/23/2017   x1  to midLAD  . SOB (shortness of breath)    12-31-2019  per pt only when he has atrial fib , otherwise no issues with sob with activities    . Status post placement of implantable loop recorder 09/08/2019   followed by dr Rayann Heman    Past Surgical History:  Procedure Laterality Date  . APPENDECTOMY  1975  . ATRIAL FIBRILLATION ABLATION N/A 11/02/2013   PVI by Dr Rayann Heman  . ATRIAL FIBRILLATION ABLATION N/A 03/29/2014   PVI and CTI by Dr Rayann Heman  . ATRIAL FIBRILLATION ABLATION N/A 07/04/2016   Procedure: Atrial Fibrillation Ablation;  Surgeon: Thompson Grayer, MD;  Location: [place] [facility];  Service: Cardiovascular;  Laterality: N/A;  . CARDIOVERSION N/A 10/15/2013   Procedure: CARDIOVERSION;  Surgeon: Fay Records, MD;  Location: [place];  Service: Cardiovascular;  Laterality: N/A;  . COLONOSCOPY  2016  . CORONARY STENT INTERVENTION N/A 12/23/2017   Procedure: CORONARY STENT INTERVENTION;  Surgeon: Burnell Blanks, MD;  Location: [place] [facility];  Service: Cardiovascular;  Laterality: N/A;  . EXTRACORPOREAL SHOCK WAVE LITHOTRIPSY  2005;  2013  . implantable loop recorder placement  09/08/2019   (done in office)   Medtronic Reveal Linq model LNQ 22 (SN [id number]) implantable loop recorder implanted in office by Dr Rayann Heman for afib management  . INGUINAL HERNIA REPAIR Left 05/31/2015   Procedure: HERNIA REPAIR INGUINAL ADULT;  Surgeon: Robert Bellow, MD;  Location: [facility];  Service: General;  Laterality: Left;  . KNEE ARTHROSCOPY Left 2009   @[facility]   . KNEE  ARTHROSCOPY WITH MEDIAL MENISECTOMY Right 01/04/2020   Procedure: KNEE ARTHROSCOPY WITH PARTIAL MEDIAL MENISECTOMY;  Surgeon: Nicholes Stairs, MD;  Location: [facility];  Service: Orthopedics;  Laterality: Right;  . RHINOPLASTY  2006    @[facility]   . RIGHT/LEFT HEART CATH AND CORONARY ANGIOGRAPHY N/A 12/23/2017   Procedure: RIGHT/LEFT HEART CATH AND CORONARY ANGIOGRAPHY;  Surgeon: Burnell Blanks, MD;  Location: [place] [facility];  Service: Cardiovascular;  Laterality: N/A;  . ROBOT ASSISTED INGUINAL HERNIA REPAIR Left 05-01-2013   @[facility]   .  TEE WITHOUT CARDIOVERSION N/A 11/01/2013   Procedure: TRANSESOPHAGEAL ECHOCARDIOGRAM (TEE);  Surgeon: Candee Furbish, MD;  Location: [facility];  Service: Cardiovascular;  Laterality: N/A;  . TEE WITHOUT CARDIOVERSION N/A 03/28/2014   Procedure: TRANSESOPHAGEAL ECHOCARDIOGRAM (TEE);  Surgeon: Larey Dresser, MD;  Location: [place];  Service: Cardiovascular;  Laterality: N/A;     Medications Prior to Admission: Prior to Admission medications   Medication Sig Start Date End Date Taking? Authorizing Provider  aspirin 81 MG EC tablet Take 81 mg by mouth daily.  03/05/11  Yes [provider]  aspirin-acetaminophen-caffeine (EXCEDRIN MIGRAINE) [phone] MG tablet Take 2 tablets by mouth every 6 (six) hours as needed for headache.   Yes [provider]  azelastine (ASTELIN) 0.1 % nasal spray Place 1 spray into both nostrils 2 (two) times daily. Patient taking differently: Place 1 spray into both nostrils 2 (two) times daily as needed for allergies.  12/16/19  Yes Jerrol Banana., MD  diltiazem (CARDIZEM CD) 240 MG 24 hr capsule Take 1 capsule (240 mg total) by mouth daily. Patient taking differently: Take 240 mg by mouth at bedtime.  01/13/20  Yes Allred, Jeneen Rinks, MD  dofetilide (TIKOSYN) 500 MCG capsule Take 1 capsule (500 mcg total) by mouth 2 (two) times daily. 09/13/19  Yes Allred, Jeneen Rinks, MD  fluticasone (FLONASE) 50 MCG/ACT nasal spray Place 2 sprays into both nostrils daily. Patient taking differently: Place 2 sprays into both nostrils daily.  12/16/19  Yes Jerrol Banana., MD  ibuprofen (ADVIL) 200 MG tablet Take 800 mg by mouth every 6 (six) hours as needed for headache or moderate pain.   Yes [provider]  losartan (COZAAR) 50 MG tablet Take 1 tablet (50 mg total) by mouth daily. Patient taking differently: Take 50 mg by mouth at bedtime.  01/10/20  Yes Jerrol Banana., MD  montelukast (SINGULAIR) 10 MG tablet Take 1 tablet (10 mg total) by mouth  at bedtime. 12/16/19  Yes Jerrol Banana., MD  rosuvastatin (CRESTOR) 40 MG tablet Take 1 tablet (40 mg total) by mouth daily. Patient taking differently: Take 40 mg by mouth at bedtime.  07/16/19  Yes Jerrol Banana., MD  tamsulosin (FLOMAX) 0.4 MG CAPS capsule Take 1 capsule (0.4 mg total) by mouth daily after supper. Patient taking differently: Take 0.4 mg by mouth daily as needed (kidney stones).  12/17/19  Yes Festus Aloe, MD  traMADol (ULTRAM) 50 MG tablet Take 1 tablet (50 mg total) by mouth every 6 (six) hours as needed. Patient taking differently: Take 50 mg by mouth every 6 (six) hours as needed for moderate pain.  12/17/19  Yes Festus Aloe, MD  XARELTO 20 MG TABS tablet Take 1 tablet (20 mg total) by mouth daily with supper. Patient taking differently: Take 20 mg by mouth daily with supper.  11/22/19  Yes Thompson Grayer, MD     Allergies:    Allergies  Allergen Reactions  . Hydrocodone Nausea And Vomiting  . Oxycodone Nausea And Vomiting    Social History:   Social History   Socioeconomic History  . Marital status: Married    Spouse name: Not on file  . Number of children: Not on file  . Years of education: Not on file  . Highest education level: Not on file  Occupational History  . Occupation: Passenger transport manager w/ [employer]  Tobacco Use  . Smoking status: Never Smoker  . Smokeless tobacco: Never Used  Vaping Use  . Vaping Use: Never used  Substance and Sexual Activity  . Alcohol use: Yes    Comment: occasionally  . Drug use: Never  . Sexual activity: Yes  Other Topics Concern  . Not on file  Social History Narrative   Lives in [place] with his spouse.  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[employer]   Social Determinants of Radio broadcast assistant Strain:   . Difficulty of Paying Living Expenses: Not on file  Food Insecurity:   . Worried About Charity fundraiser in the Last Year: Not on file  . Ran Out of Food in the Last  Year: Not on file  Transportation Needs:   . Lack of Transportation (Medical): Not on file  . Lack of Transportation (Non-Medical): Not on file  Physical Activity:   . Days of Exercise per Week: Not on file  . Minutes of Exercise per Session: Not on file  Stress:   . Feeling of Stress : Not on file  Social Connections:   . Frequency of Communication with Friends and Family: Not on file  . Frequency of Social Gatherings with Friends and Family: Not on file  . Attends Religious Services: Not on file  . Active Member of Clubs or Organizations: Not on file  . Attends Archivist Meetings: Not on file  . Marital Status: Not on file  Intimate Partner Violence:   . Fear of Current or Ex-Partner: Not on file  . Emotionally Abused: Not on file  . Physically Abused: Not on file  . Sexually Abused: Not on file    Family History:   The patient's family history includes Atrial fibrillation in his maternal grandmother; CVA in his paternal grandmother; Cancer in his mother; Colon polyps in an other family member; Heart attack (age of onset: 71) in his maternal grandfather; Heart failure in his brother; Hypertension in his maternal grandfather. There is no history of Kidney cancer, Bladder Cancer, or Prostate cancer.    ROS:  Please see the history of present illness.  All other ROS reviewed and negative.     Physical Exam/Data:   Vitals:   02/02/20 0900  BP: 127/89  Pulse: (!) 118  Resp: 14  Temp: 98.3 F (36.8 C)  TempSrc: Oral  SpO2: 99%  Weight: 88.5 kg  Height: 5' 10.5" (1.791 m)   No intake or output data in the 24 hours ending 02/02/20 1009 Last 3 Weights 02/02/2020 01/14/2020 01/12/2020  Weight (lbs) 195 lb 201 lb 195 lb  Weight (kg) 88.451 kg 91.173 kg 88.451 kg     Body mass index is 27.58 kg/m.  General:  Well nourished, well developed, in no acute distress HEENT: normal Lymph: no adenopathy Neck: no*JVD Endocrine:  No thryomegaly Vascular: No carotid bruits;  FA pulses 2+ bilaterally without bruits  Cardiac:  normal S1, S2; irregularly irregular; no murmur  Lungs:  clear to auscultation bilaterally, no wheezing, rhonchi  or rales  Abd: soft, nontender, no hepatomegaly  Ext: no edema Musculoskeletal:  No deformities, BUE and BLE strength normal and equal Skin: warm and dry  Neuro:  CNs 2-12 intact, no focal abnormalities noted Psych:  Normal affect    EKG:  The ECG that was done atrial fibrillation   Laboratory Data:  Chemistry Recent Labs  Lab 01/31/20 0807  NA 140  K 4.2  CL 105  CO2 28  GLUCOSE 105*  BUN 16  CREATININE 0.80  CALCIUM 9.4  GFRNONAA 101  GFRAA 117    No results for input(s): PROT, ALBUMIN, AST, ALT, ALKPHOS, BILITOT in the last 168 hours. Hematology Recent Labs  Lab 01/31/20 0807  WBC 5.8  RBC 4.26  HGB 12.9*  HCT 38.1  MCV 89  MCH 30.3  MCHC 33.9  RDW 13.9  PLT 254   Assessment and Plan:   Persistent Atrial fibrillation; LCP, RHC, and TEE prior to [place] has been requested to perform a transesophageal echocardiogram on Myking T Mccamy [name] for evaluation of Left atrial appendage prior to MAZE.  After careful review of history and examination, the risks and benefits of transesophageal echocardiogram have been explained including risks of esophageal damage, perforation (1:10,000 risk), bleeding, pharyngeal hematoma as well as other potential complications associated with conscious sedation including aspiration, arrhythmia, respiratory failure and death. Alternatives to treatment were discussed, questions were answered. Patient is willing to proceed.   Werner Lean, MD  02/02/2020 10:12 AM    For questions or updates, please contact [place] Please consult [email or web address] for contact info under     Signed, Werner Lean, MD  02/02/2020 10:09 AM

## 2020-02-02 NOTE — Discharge Instructions (Signed)
Resume Xarelto tomorrow if no bleeding from cath site.  Drink plenty of fluids Keep right arm at or above heart level.    Radial Site Care  This sheet gives you information about how to care for yourself after your procedure. Your health care provider may also give you more specific instructions. If you have problems or questions, contact your health care provider. What can I expect after the procedure? After the procedure, it is common to have:  Bruising and tenderness at the catheter insertion area. Follow these instructions at home: Medicines  Take over-the-counter and prescription medicines only as told by your health care provider. Insertion site care  Follow instructions from your health care provider about how to take care of your insertion site. Make sure you: ? Wash your hands with soap and water before you change your bandage (dressing). If soap and water are not available, use hand sanitizer. ? Change your dressing as told by your health care provider. ? Leave stitches (sutures), skin glue, or adhesive strips in place. These skin closures may need to stay in place for 2 weeks or longer. If adhesive strip edges start to loosen and curl up, you may trim the loose edges. Do not remove adhesive strips completely unless your health care provider tells you to do that.  Check your insertion site every day for signs of infection. Check for: ? Redness, swelling, or pain. ? Fluid or blood. ? Pus or a bad smell. ? Warmth.  Do not take baths, swim, or use a hot tub until your health care provider approves.  You may shower 24-48 hours after the procedure, or as directed by your health care provider. ? Remove the dressing and gently wash the site with plain soap and water. ? Pat the area dry with a clean towel. ? Do not rub the site. That could cause bleeding.  Do not apply powder or lotion to the site. Activity   For 24 hours after the procedure, or as directed by your health  care provider: ? Do not flex or bend the affected arm. ? Do not push or pull heavy objects with the affected arm. ? Do not drive yourself home from the hospital or clinic. You may drive 24 hours after the procedure unless your health care provider tells you not to. ? Do not operate machinery or power tools.  Do not lift anything that is heavier than 10 lb (4.5 kg), or the limit that you are told, until your health care provider says that it is safe.  Ask your health care provider when it is okay to: ? Return to work or school. ? Resume usual physical activities or sports. ? Resume sexual activity. General instructions  If the catheter site starts to bleed, raise your arm and put firm pressure on the site. If the bleeding does not stop, get help right away. This is a medical emergency.  If you went home on the same day as your procedure, a responsible adult should be with you for the first 24 hours after you arrive home.  Keep all follow-up visits as told by your health care provider. This is important. Contact a health care provider if:  You have a fever.  You have redness, swelling, or yellow drainage around your insertion site. Get help right away if:  You have unusual pain at the radial site.  The catheter insertion area swells very fast.  The insertion area is bleeding, and the bleeding does not stop when  you hold steady pressure on the area.  Your arm or hand becomes pale, cool, tingly, or numb. These symptoms may represent a serious problem that is an emergency. Do not wait to see if the symptoms will go away. Get medical help right away. Call your local emergency services (911 in the [place] Do not drive yourself to the hospital. Summary  After the procedure, it is common to have bruising and tenderness at the site.  Follow instructions from your health care provider about how to take care of your radial site wound. Check the wound every day for signs of infection.  Do  not lift anything that is heavier than 10 lb (4.5 kg), or the limit that you are told, until your health care provider says that it is safe. This information is not intended to replace advice given to you by your health care provider. Make sure you discuss any questions you have with your health care provider. Document Revised: 05/21/2017 Document Reviewed: 05/21/2017 Elsevier Patient Education  2020 [employer].

## 2020-02-03 ENCOUNTER — Encounter (HOSPITAL_COMMUNITY): Payer: Self-pay | Admitting: Cardiovascular Disease

## 2020-02-06 ENCOUNTER — Encounter (HOSPITAL_COMMUNITY): Payer: Self-pay | Admitting: Internal Medicine

## 2020-02-07 ENCOUNTER — Ambulatory Visit
Admission: RE | Admit: 2020-02-07 | Discharge: 2020-02-07 | Disposition: A | Discharge status: Home / Self Care | Payer: BC Managed Care – PPO | Source: Ambulatory Visit | Attending: Thoracic Surgery (Cardiothoracic Vascular Surgery) | Admitting: Thoracic Surgery (Cardiothoracic Vascular Surgery)

## 2020-02-07 ENCOUNTER — Encounter: Payer: Self-pay | Admitting: Thoracic Surgery (Cardiothoracic Vascular Surgery)

## 2020-02-07 ENCOUNTER — Ambulatory Visit (INDEPENDENT_AMBULATORY_CARE_PROVIDER_SITE_OTHER): Payer: BC Managed Care – PPO | Admitting: Thoracic Surgery (Cardiothoracic Vascular Surgery)

## 2020-02-07 ENCOUNTER — Other Ambulatory Visit: Payer: Self-pay | Admitting: *Deleted

## 2020-02-07 ENCOUNTER — Encounter: Payer: Self-pay | Admitting: *Deleted

## 2020-02-07 ENCOUNTER — Other Ambulatory Visit: Payer: Self-pay

## 2020-02-07 VITALS — BP 135/69 | HR 86 | Temp 97.7°F | Resp 18 | Ht 70.5 in | Wt 195.0 lb

## 2020-02-07 DIAGNOSIS — Z01818 Encounter for other preprocedural examination: Secondary | ICD-10-CM

## 2020-02-07 DIAGNOSIS — I480 Paroxysmal atrial fibrillation: Secondary | ICD-10-CM | POA: Diagnosis not present

## 2020-02-07 MED ORDER — IOPAMIDOL (ISOVUE-370) INJECTION 76%
75.0000 mL | Freq: Once | INTRAVENOUS | Status: AC | PRN
  Administered 2020-02-07: 75 mL via INTRAVENOUS

## 2020-02-07 NOTE — Progress Notes (Signed)
[address]       [place] [address]             [phone]     CARDIOTHORACIC SURGERY OFFICE NOTE  Referring Provider is Thompson Grayer, MD Primary Cardiologist is Lauree Chandler, MD PCP is Jerrol Banana., MD   HPI:  Patient is a 54 year old male with history of coronary artery disease status post PCI and stenting of the left anterior descending coronary artery in 2019, hypertension, obstructive sleep apnea on CPAP, hyperlipidemia, and chronic recurrent paroxysmal atrial fibrillation on long-term anticoagulation who returns to the office today to further discuss treatment options for management of symptomatic atrial fibrillation that has failed multiple drugs, cardioversion, and catheter-based ablation.  He was originally seen in consultation on January 12, 2020.  Subsequent to his previous visit the patient made a decision to proceed with further diagnostic testing and possible Maze procedure.  He underwent TEE and diagnostic cardiac catheterization on February 02, 2020.  TEE revealed normal left ventricular systolic function with ejection fraction estimated 55 to 60%.  There was mild mitral regurgitation and mild aortic insufficiency.  There was left atrial enlargement.  There was no thrombus seen in the left atrial appendage.  Diagnostic cardiac catheterization revealed patent stent in the mid left anterior descending coronary artery with no evidence for restenosis.  There was otherwise moderate nonobstructive coronary artery disease.  CT angiography with performed earlier today and the patient returns to our office for follow-up.  He reports no new problems or complaints over the past month.  He still complains that he seems to be in atrial fibrillation more often than he is and rhythm, and whenever he is out of rhythm he gets short of breath and tired much more easily.  He is having some palpitations.  He denies any chest pain.  The remainder of his review  of systems is unchanged from previously.   Current Outpatient Medications  Medication Sig Dispense Refill  . aspirin 81 MG EC tablet Take 81 mg by mouth daily.     Marland Kitchen aspirin-acetaminophen-caffeine (EXCEDRIN MIGRAINE) 250-250-65 MG tablet Take 2 tablets by mouth every 6 (six) hours as needed for headache.    Marland Kitchen azelastine (ASTELIN) 0.1 % nasal spray Place 1 spray into both nostrils 2 (two) times daily. (Patient taking differently: Place 1 spray into both nostrils 2 (two) times daily as needed for allergies. ) 30 mL 12  . diltiazem (CARDIZEM CD) 240 MG 24 hr capsule Take 1 capsule (240 mg total) by mouth daily. (Patient taking differently: Take 240 mg by mouth at bedtime. ) 90 capsule 3  . dofetilide (TIKOSYN) 500 MCG capsule Take 1 capsule (500 mcg total) by mouth 2 (two) times daily. 180 capsule 3  . fluticasone (FLONASE) 50 MCG/ACT nasal spray Place 2 sprays into both nostrils daily. (Patient taking differently: Place 2 sprays into both nostrils daily. ) 16 g 3  . ibuprofen (ADVIL) 200 MG tablet Take 800 mg by mouth every 6 (six) hours as needed for headache or moderate pain.    Marland Kitchen losartan (COZAAR) 50 MG tablet Take 1 tablet (50 mg total) by mouth daily. (Patient taking differently: Take 50 mg by mouth at bedtime. ) 30 tablet 11  . montelukast (SINGULAIR) 10 MG tablet Take 1 tablet (10 mg total) by mouth at bedtime. 90 tablet 3  . rosuvastatin (CRESTOR) 40 MG tablet Take 1 tablet (40 mg total) by mouth daily. (Patient taking differently: Take  40 mg by mouth at bedtime. ) 90 tablet 2  . tamsulosin (FLOMAX) 0.4 MG CAPS capsule Take 1 capsule (0.4 mg total) by mouth daily after supper. (Patient taking differently: Take 0.4 mg by mouth daily as needed (kidney stones). ) 30 capsule 3  . traMADol (ULTRAM) 50 MG tablet Take 1 tablet (50 mg total) by mouth every 6 (six) hours as needed. (Patient taking differently: Take 50 mg by mouth every 6 (six) hours as needed for moderate pain. ) 15 tablet 0  . XARELTO  20 MG TABS tablet Take 1 tablet (20 mg total) by mouth daily with supper. (Patient taking differently: Take 20 mg by mouth daily with supper. ) 90 tablet 1   No current facility-administered medications for this visit.      Physical Exam:   BP 135/69 (BP Location: Left Arm, Patient Position: Sitting)   Pulse 86   Temp 97.7 F (36.5 C)   Resp 18   Ht 5' 10.5" (1.791 m)   Wt 195 lb (88.5 kg)   SpO2 96% Comment: RA with mask on  BMI 27.58 kg/m   General:  Well-appearing  Chest:   Clear to auscultation  CV:   Irregular rate and rhythm  Incisions:  n/a  Abdomen:  Soft nontender  Extremities:  Warm and well-perfused  Diagnostic Tests:   TRANSESOPHOGEAL ECHO REPORT       Patient Name:  Jorge [name] Date of Exam: 02/02/2020  Medical Rec #: [id number]     Height:    70.5 in  Accession #:  [id number]     Weight:    195.0 lb  Date of Birth: 1965-08-08     BSA:     2.076 m  Patient Age:  30 years      BP:      143/108 mmHg  Patient Gender: M         HR:      115 bpm.  Exam Location: Inpatient   Procedure: Transesophageal Echo, Cardiac Doppler and Color Doppler   Indications:   Atrial fibrillation    History:     Patient has prior history of Echocardiogram examinations,  most          recent 05/12/2019. CAD, Arrythmias:Atrial Fibrillation;  Risk          Factors:Hypertension, Dyslipidemia and Sleep Apnea.    Sonographer:   Dockstader Lefort RDCS (AE)  Referring Phys: [id number] [facility]  Diagnosing Phys: Rudean Haskell MD   PROCEDURE: After discussion of the risks and benefits of a TEE, an  informed consent was obtained from the patient. The transesophogeal probe  was passed without difficulty through the esophogus of the patient. Local  oropharyngeal anesthetic was provided  with Cetacaine. Sedation performed by performing physician. Patients was  under conscious  sedation during this procedure. Anesthetic administered:  16mcg of Fentanyl, 7.0mg  of Versed. Image quality was good. The patient  developed no complications during  the procedure.   IMPRESSIONS    1. Left ventricular ejection fraction, by estimation, is 55 to 60%. The  left ventricle has normal function. The left ventricle has no regional  wall motion abnormalities. Left ventricular diastolic function could not  be evaluated.  2. Right ventricular systolic function is normal. The right ventricular  size is normal. Likely normal right ventricular thickness.  3. Left atrial size was dilated. No left atrial/left atrial appendage  thrombus was detected.  4. The mitral valve is normal in structure. Mild mitral  valve  regurgitation.  5. The aortic valve is tricuspid. Aortic valve regurgitation is mild.   Conclusion(s)/Recommendation(s): Normal biventricular function without  evidence of hemodynamically significant valvular heart disease.   FINDINGS  Left Ventricle: Left ventricular ejection fraction, by estimation, is 55  to 60%. The left ventricle has normal function. The left ventricle has no  regional wall motion abnormalities. The left ventricular internal cavity  size was normal in size. There is  no left ventricular hypertrophy. Left ventricular diastolic function  could not be evaluated.   Right Ventricle: The right ventricular size is normal. Likely normal right  ventricular thickness. Right ventricular systolic function is normal.   Left Atrium: Left atrial size was dilated. No left atrial/left atrial  appendage thrombus was detected.   Right Atrium: Right atrial size was normal in size.   Pericardium: There is no evidence of pericardial effusion.   Mitral Valve: Mitra regurgitation at A2-P2 related to atrial dilation. The  mitral valve is normal in structure. Mild mitral valve regurgitation.   Tricuspid Valve: The tricuspid valve is normal in structure.  Tricuspid  valve regurgitation is trivial.   Aortic Valve: Aortic Regurgitation is central. The aortic valve is  tricuspid. Aortic valve regurgitation is mild.   Pulmonic Valve: The pulmonic valve was grossly normal. Pulmonic valve  regurgitation is not visualized.   Aorta: The aortic root is normal in size and structure and the aortic root  and ascending aorta are structurally normal, with no evidence of  dilitation. There is minimal (Grade I) plaque.   IAS/Shunts: The atrial septum is grossly normal.     LEFT VENTRICLE  PLAX 2D  LVOT diam:   2.10 cm  LVOT Area:   3.46 cm       AORTA  Ao Root diam: 3.60 cm  Ao Asc diam: 3.50 cm     SHUNTS  Systemic Diam: 2.10 cm   Rudean Haskell MD  Electronically signed by Rudean Haskell MD  Signature Date/Time: 02/02/2020/12:58:54 PM      RIGHT/LEFT HEART CATH AND CORONARY ANGIOGRAPHY  Conclusion    Ost 1st Diag lesion is 20% stenosed.  Previously placed Prox LAD drug eluting stent is widely patent.  Balloon angioplasty was performed.  Ost 2nd Mrg lesion is 30% stenosed.  Prox Cx lesion is 30% stenosed.  Mid Cx lesion is 50% stenosed.  Mid RCA lesion is 30% stenosed.  RPAV lesion is 40% stenosed.   1. Patent mid LAD stent with no restenosis 2. Mild to moderate mid Circumflex stenosis, unchanged from last cath 3. Mild to moderate right posterolateral artery stenosis  Recommendations: Continue medical management of CAD. No further ischemic workup. Proceed with planned surgical procedure.    Recommendations  Antiplatelet/Anticoag Continue medical management of CAD. No further ischemic workup. Proceed with planned surgical procedure.  Surgeon Notes    02/02/2020 10:58 AM CV Procedure addendum by Werner Lean, MD  Indications  Pre-operative clearance [phone] (ICD-10-CM)]  Coronary artery disease involving native coronary artery of native heart without angina pectoris [I25.10  (ICD-10-CM)]  Procedural Details  Technical Details Indication: 54 yo male with atrial fibrillation with pending MAZE procedure. Cardiac cath pre-operatively to exclude progression of CAD  Procedure: The risks, benefits, complications, treatment options, and expected outcomes were discussed with the patient. The patient and/or family concurred with the proposed plan, giving informed consent. The patient was brought to the cath lab after IV hydration was given. The patient was sedated with Versed and Fentanyl. The Right antecubital vein  IV was changed for a 5 [place] sheath. Right heart catheterization performed with a balloon tipped catheter. The right wrist was prepped and draped in a sterile fashion. 1% lidocaine was used for local anesthesia. Using the modified Seldinger access technique, a 5 French sheath was placed in the right radial artery. 3 mg Verapamil was given through the sheath. 4500 units IV heparin was given. Standard diagnostic catheters were used to perform selective coronary angiography. LV pressures measured with the JR4 catheter. The sheath was removed from the right radial artery and a Terumo hemostasis band was applied at the arteriotomy site on the right wrist.    Estimated blood loss <50 mL.   During this procedure medications were administered to achieve and maintain moderate conscious sedation while the patient's heart rate, blood pressure, and oxygen saturation were continuously monitored and I was present face-to-face 100% of this time.  Medications (Filter: Administrations occurring from 1335 to 1417 on 02/02/20) (important) Continuous medications are totaled by the amount administered until 02/02/20 1417.  fentaNYL (SUBLIMAZE) injection (mcg) Total dose:  50 mcg Date/Time  Rate/Dose/Volume Action  02/02/20 1343  50 mcg Given    midazolam (VERSED) injection (mg) Total dose:  2 mg Date/Time  Rate/Dose/Volume Action  02/02/20 1343  2 mg Given    lidocaine (PF)  (XYLOCAINE) 1 % injection (mL) Total volume:  5 mL Date/Time  Rate/Dose/Volume Action  02/02/20 1351  5 mL Given    Radial Cocktail/Verapamil only (mL) Total volume:  10 mL Date/Time  Rate/Dose/Volume Action  02/02/20 1353  10 mL Given    Heparin (Porcine) in NaCl 1000-0.9 UT/500ML-% SOLN (mL) Total volume:  1,000 mL Date/Time  Rate/Dose/Volume Action  02/02/20 1354  500 mL Given  1354  500 mL Given    heparin sodium (porcine) injection (Units) Total dose:  4,500 Units Date/Time  Rate/Dose/Volume Action  02/02/20 1403  4,500 Units Given    iohexol (OMNIPAQUE) 350 MG/ML injection (mL) Total volume:  50 mL Date/Time  Rate/Dose/Volume Action  02/02/20 1405  50 mL Given    Sedation Time  Sedation Time Physician-1: 26 minutes 36 seconds  Contrast  Medication Name Total Dose  iohexol (OMNIPAQUE) 350 MG/ML injection 50 mL    Radiation/Fluoro  Fluoro time: 3 (min) DAP: 90240 (mGycm2) Cumulative Air Kerma: 973 (mGy)  Complications  Complications documented before study signed (02/02/2020 2:17 PM)   RIGHT/LEFT HEART CATH AND CORONARY ANGIOGRAPHY  None Documented by Burnell Blanks, MD 02/02/2020 2:13 PM  Date Found: 02/02/2020  Time Range: Intraprocedure      Coronary Findings  Diagnostic Dominance: Right Left Anterior Descending  Vessel is large.  Previously placed Prox LAD drug eluting stent is widely patent.  First Diagonal Branch  Vessel is moderate in size.  Ost 1st Diag lesion is 20% stenosed.  Left Circumflex  Prox Cx lesion is 30% stenosed.  Mid Cx lesion is 50% stenosed.  First Obtuse Marginal Branch  Vessel is small in size.  Second Obtuse Marginal Branch  Vessel is moderate in size.  Ost 2nd Mrg lesion is 30% stenosed.  Right Coronary Artery  Vessel is large.  Mid RCA lesion is 30% stenosed.  Right Posterior Atrioventricular Artery  Vessel is large in size.  RPAV lesion is 40% stenosed.  Intervention  No interventions have been  documented. Coronary Diagrams  Diagnostic Dominance: Right       Impression:  Patient has chronic recurrent paroxysmal atrial fibrillation which appears to be approaching longstanding persistent atrial fibrillation that  has failed pharmacologic therapy on multiple drugs as well as 2 previous attempts at catheter-based ablation.  Recent interrogation of implantable loop recorder documents that the patient remains in atrial fibrillation more than 70% of the time.  He remains symptomatic with primary complaints of exertional shortness of breath and fatigue as well as occasional chest tightness, palpitations and dizzy spells.  I have personally reviewed the patient's recent TEE, diagnostic cardiac catheterization, and CT angiogram.   The patient has dilated left atrium with no evidence for left atrial clot.  There is normal left ventricular systolic function.  There is mild aortic insufficiency and mild mitral regurgitation.  Catheterization is notable for widely patent stent in the left anterior descending coronary artery and otherwise moderate nonobstructive coronary artery disease.  The patient CT angiogram has not yet been officially read by a radiologist.  However, there do not appear to be any contraindications to peripheral cannulation for surgery.  Options include continued medical therapy without any further intervention at this time, repeat attempts at catheter-based ablation, or possible surgical intervention.  Given the patient's relatively young age I think it would be reasonable to consider surgical Maze procedure.  The patient appears to be a good candidate for minimally invasive approach.   Plan:  I have again discussed the natural history of atrial fibrillation at length with the patient in the office today.  We discussed the differences between primary lone atrial fibrillation and atrial fibrillation associated with other cardiac or respiratory problems.  We discussed  treatment strategies including rate control with long-term anticoagulation, pharmacologic therapy, catheter-based therapies, and a variety of surgical options.  We discussed the rationale for surgical Maze procedure and our local experience for patients both with and without need for concomitant surgical procedures.  We discussed alternative surgical approaches including both conventional sternotomy and minithoracotomy approach for a full Maze procedure versus other less invasive surgical approaches without use of cardiopulmonary bypass such as the convergent procedure.  Expectations for the patient's postoperative convalescence and long-term freedom from atrial fibrillation have been discussed.  We tentatively plan to proceed with surgery on March 08, 2020.  Patient will return to our office for follow-up prior to surgery on March 06, 2020.     I spent in excess of 15 minutes during the conduct of this office consultation and >50% of this time involved direct face-to-face encounter with the patient for counseling and/or coordination of their care.    Valentina Gu. Roxy Manns, MD 02/07/2020 3:10 PM

## 2020-02-07 NOTE — Patient Instructions (Signed)
Stop taking Xarelto and aspirin 7 days prior to surgery (03/01/2020)  Continue taking all other medications without change through the day before surgery.  Make sure to bring all of your medications with you when you come for your Pre-Admission Testing appointment at [facility] Short-Stay Department.  Have nothing to eat or drink after midnight the night before surgery.  On the morning of surgery do not take any medications.  At your appointment for Pre-Admission Testing at the [facility] Short-Stay Department you will be asked to sign permission forms for your upcoming surgery.  By definition your signature on these forms implies that you and/or your designee provide full informed consent for your planned surgical procedure(s), that alternative treatment options have been discussed, that you understand and accept any and all potential risks, and that you have some understanding of what to expect for your post-operative convalescence.  For any major cardiac surgical procedure potential operative risks include but are not limited to at least some risk of death, stroke or other neurologic complication, myocardial infarction, congestive heart failure, respiratory failure, renal failure, bleeding requiring blood transfusion and/or reexploration, irregular heart rhythm, heart block or bradycardia requiring permanent pacemaker, pneumonia, pericardial effusion, pleural effusion, wound infection, pulmonary embolus or other thromboembolic complication, chronic pain, or other complications related to the specific procedure(s) performed.  Please call to schedule a follow-up appointment in our office prior to surgery if you have any unresolved questions about your planned surgical procedure, the associated risks, alternative treatment options, and/or expectations for your post-operative recovery.

## 2020-02-16 ENCOUNTER — Ambulatory Visit (INDEPENDENT_AMBULATORY_CARE_PROVIDER_SITE_OTHER): Payer: BC Managed Care – PPO | Admitting: Internal Medicine

## 2020-02-16 ENCOUNTER — Encounter: Payer: Self-pay | Admitting: Internal Medicine

## 2020-02-16 ENCOUNTER — Other Ambulatory Visit: Payer: Self-pay

## 2020-02-16 VITALS — BP 132/76 | HR 119 | Ht 70.5 in | Wt 198.8 lb

## 2020-02-16 DIAGNOSIS — I10 Essential (primary) hypertension: Secondary | ICD-10-CM

## 2020-02-16 DIAGNOSIS — I2510 Atherosclerotic heart disease of native coronary artery without angina pectoris: Secondary | ICD-10-CM

## 2020-02-16 DIAGNOSIS — I4819 Other persistent atrial fibrillation: Secondary | ICD-10-CM

## 2020-02-16 NOTE — Progress Notes (Signed)
PCP: Jerrol Banana., MD Primary Cardiologist: Dr Angelena Form Primary EP: Dr Rayann Heman  Jorge [name] is a 53 y.o. male who presents today for routine electrophysiology followup.  Since last being seen in our clinic, the patient reports doing very well.  Today, he denies symptoms of palpitations, chest pain, shortness of breath,  lower extremity edema, dizziness, presyncope, or syncope.  The patient is otherwise without complaint today.   Past Medical History:  Diagnosis Date  . Allergic rhinitis, seasonal   . Anticoagulant long-term use managed by cardiology   asa and xarelto  . Bilateral nephrolithiasis    urologist--- dr Junious Silk ([place] office)  . Coronary artery disease cardiologist--- dr Angelena Form   nuclear stress test 10-18-2016 w/ normal perfusion;  cardiac cath 12-23-2017, PCI w/ DES to midLAD and mild RCA/ moderate CFx diease nonobstructive , normal LVF;  ETT 07-13-2019 negative for ischemia  . History of kidney stones   . Hyperlipidemia   . Hypertension    followed by pcp and cardiology  . OA (osteoarthritis)    knees  . OSA on CPAP    12-31-2019  per pt uses cpap every night  . PAF (paroxysmal atrial fibrillation) ([place]) 2011   followed by dr Rayann Heman (ep)---  first dx 2011;  s/p DCCV's and ablation x2 in 2015 and last one 03/ 2018, and s/p loop recorder 05/ 2021;   failed multaq, flecainide, and amiodarone  . Right knee meniscal tear   . S/P ablation of atrial fibrillation followed by dr Rehaan Viloria   x3  last one 2018  . S/P drug eluting coronary stent placement 12/23/2017   x1  to midLAD  . SOB (shortness of breath)    12-31-2019  per pt only when he has atrial fib , otherwise no issues with sob with activities  . Status post placement of implantable loop recorder 09/08/2019   followed by dr Rayann Heman   Past Surgical History:  Procedure Laterality Date  . APPENDECTOMY  1975  . ATRIAL FIBRILLATION ABLATION N/A 11/02/2013   PVI by Dr Rayann Heman  . ATRIAL FIBRILLATION  ABLATION N/A 03/29/2014   PVI and CTI by Dr Rayann Heman  . ATRIAL FIBRILLATION ABLATION N/A 07/04/2016   Procedure: Atrial Fibrillation Ablation;  Surgeon: Thompson Grayer, MD;  Location: [place] [facility];  Service: Cardiovascular;  Laterality: N/A;  . CARDIOVERSION N/A 10/15/2013   Procedure: CARDIOVERSION;  Surgeon: Fay Records, MD;  Location: [place];  Service: Cardiovascular;  Laterality: N/A;  . COLONOSCOPY  2016  . CORONARY STENT INTERVENTION N/A 12/23/2017   Procedure: CORONARY STENT INTERVENTION;  Surgeon: Burnell Blanks, MD;  Location: [place] [facility];  Service: Cardiovascular;  Laterality: N/A;  . EXTRACORPOREAL SHOCK WAVE LITHOTRIPSY  2005;  2013  . implantable loop recorder placement  09/08/2019   (done in office)   Medtronic Reveal Linq model LNQ 22 (SN [id number]) implantable loop recorder implanted in office by Dr Rayann Heman for afib management  . INGUINAL HERNIA REPAIR Left 05/31/2015   Procedure: HERNIA REPAIR INGUINAL ADULT;  Surgeon: Robert Bellow, MD;  Location: [facility];  Service: General;  Laterality: Left;  . KNEE ARTHROSCOPY Left 2009   @[facility]   . KNEE ARTHROSCOPY WITH MEDIAL MENISECTOMY Right 01/04/2020   Procedure: KNEE ARTHROSCOPY WITH PARTIAL MEDIAL MENISECTOMY;  Surgeon: Nicholes Stairs, MD;  Location: [facility];  Service: Orthopedics;  Laterality: Right;  . RHINOPLASTY  2006    @[facility]   . RIGHT/LEFT HEART CATH AND CORONARY ANGIOGRAPHY  N/A 12/23/2017   Procedure: RIGHT/LEFT HEART CATH AND CORONARY ANGIOGRAPHY;  Surgeon: Burnell Blanks, MD;  Location: [place] [facility];  Service: Cardiovascular;  Laterality: N/A;  . RIGHT/LEFT HEART CATH AND CORONARY ANGIOGRAPHY N/A 02/02/2020   Procedure: RIGHT/LEFT HEART CATH AND CORONARY ANGIOGRAPHY;  Surgeon: Burnell Blanks, MD;  Location: [place] [facility];  Service: Cardiovascular;  Laterality: N/A;  . ROBOT ASSISTED INGUINAL HERNIA REPAIR Left 05-01-2013   @[facility]   . TEE WITHOUT  CARDIOVERSION N/A 11/01/2013   Procedure: TRANSESOPHAGEAL ECHOCARDIOGRAM (TEE);  Surgeon: Candee Furbish, MD;  Location: [facility];  Service: Cardiovascular;  Laterality: N/A;  . TEE WITHOUT CARDIOVERSION N/A 03/28/2014   Procedure: TRANSESOPHAGEAL ECHOCARDIOGRAM (TEE);  Surgeon: Larey Dresser, MD;  Location: [place];  Service: Cardiovascular;  Laterality: N/A;  . TEE WITHOUT CARDIOVERSION N/A 02/02/2020   Procedure: TRANSESOPHAGEAL ECHOCARDIOGRAM (TEE);  Surgeon: Werner Lean, MD;  Location[facility];  Service: Cardiovascular;  Laterality: N/A;    ROS- all systems are reviewed and negatives except as per HPI above  Current Outpatient Medications  Medication Sig Dispense Refill  . aspirin 81 MG EC tablet Take 81 mg by mouth daily.     Marland Kitchen aspirin-acetaminophen-caffeine (EXCEDRIN MIGRAINE) 250-250-65 MG tablet Take 2 tablets by mouth every 6 (six) hours as needed for headache.    Marland Kitchen azelastine (ASTELIN) 0.1 % nasal spray Place 1 spray into both nostrils 2 (two) times daily. 30 mL 12  . diltiazem (CARDIZEM CD) 240 MG 24 hr capsule Take 1 capsule (240 mg total) by mouth daily. 90 capsule 3  . dofetilide (TIKOSYN) 500 MCG capsule Take 1 capsule (500 mcg total) by mouth 2 (two) times daily. 180 capsule 3  . fluticasone (FLONASE) 50 MCG/ACT nasal spray Place 2 sprays into both nostrils daily. 16 g 3  . ibuprofen (ADVIL) 200 MG tablet Take 800 mg by mouth every 6 (six) hours as needed for headache or moderate pain.    Marland Kitchen losartan (COZAAR) 50 MG tablet Take 1 tablet (50 mg total) by mouth daily. 30 tablet 11  . montelukast (SINGULAIR) 10 MG tablet Take 1 tablet (10 mg total) by mouth at bedtime. 90 tablet 3  . rosuvastatin (CRESTOR) 40 MG tablet Take 1 tablet (40 mg total) by mouth daily. 90 tablet 2  . tamsulosin (FLOMAX) 0.4 MG CAPS capsule Take 1 capsule (0.4 mg total) by mouth daily after supper. 30 capsule 3  . traMADol (ULTRAM) 50 MG tablet Take 1 tablet (50 mg total) by mouth  every 6 (six) hours as needed. 15 tablet 0  . XARELTO 20 MG TABS tablet Take 1 tablet (20 mg total) by mouth daily with supper. 90 tablet 1   No current facility-administered medications for this visit.    Physical Exam: Vitals:   02/16/20 1516  BP: 132/76  Pulse: (!) 119  SpO2: 98%  Weight: 198 lb 12.8 oz (90.2 kg)  Height: 5' 10.5" (1.791 m)    GEN- The patient is well appearing, alert and oriented x 3 today.   Head- normocephalic, atraumatic Eyes-  Sclera clear, conjunctiva pink Ears- hearing intact Oropharynx- clear Lungs- Clear to ausculation bilaterally, normal work of breathing Heart- Regular rate and rhythm, no murmurs, rubs or gallops, PMI not laterally displaced GI- soft, NT, ND, + BS Extremities- no clubbing, cyanosis, or edema  Wt Readings from Last 3 Encounters:  02/16/20 198 lb 12.8 oz (90.2 kg)  02/07/20 195 lb (88.5 kg)  02/02/20 195 lb (88.5 kg)  EKG tracing ordered today is personally reviewed and shows afib, V rate 110 bpm  Assessment and Plan:  1. afib He has medicine refractory and ablation refractory afib. He is planned for MAZE in several weeks.  We discussed at length today.   On ILR, he has had episodes of afib with average HR of 178 bpm and max heart rates of well over 200 bpm.  I do think that this is the best strategy for him at this time.  afib burden is 71% I will speak with Dr Roxy Manns to see if we can target ligament of marshall area with his surgical ablation as this has been shown to be a trigger area for exertional afib patients also. I also think that LAA clip will offer him long term benefit.  2. CAD Recent cath is reviewed No obstructive CAD at this time Continue medical therapy  3. HTN Stable No change required today   Return to see me 4 weeks after maze.  Risks, benefits and potential toxicities for medications prescribed and/or refilled reviewed with patient today.   Thompson Grayer MD, [facility] 02/16/2020 3:25 PM

## 2020-02-16 NOTE — Patient Instructions (Addendum)
Medication Instructions:  Your physician recommends that you continue on your current medications as directed. Please refer to the Current Medication list given to you today.  *If you need a refill on your cardiac medications before your next appointment, please call your pharmacy*  Lab Work: None ordered.  If you have labs (blood work) drawn today and your tests are completely normal, you will receive your results only by: Marland Kitchen MyChart Message (if you have MyChart) OR . A paper copy in the mail If you have any lab test that is abnormal or we need to change your treatment, we will call you to review the results.  Testing/Procedures: None ordered.  Follow-Up: At [facility], you and your health needs are our priority.  As part of our continuing mission to provide you with exceptional heart care, we have created designated Provider Care Teams.  These Care Teams include your primary Cardiologist (physician) and Advanced Practice Providers (APPs -  Physician Assistants and Nurse Practitioners) who all work together to provide you with the care you need, when you need it.  We recommend signing up for the patient portal called "MyChart".  Sign up information is provided on this After Visit Summary.  MyChart is used to connect with patients for Virtual Visits (Telemedicine).  Patients are able to view lab/test results, encounter notes, upcoming appointments, etc.  Non-urgent messages can be sent to your provider as well.   To learn more about what you can do with MyChart, go to [email or web address].    Your next appointment:   Your physician wants you to follow-up in: 12/10/21at 9:15 with Dr. Rayann Heman.    Other Instructions:

## 2020-02-21 ENCOUNTER — Ambulatory Visit (INDEPENDENT_AMBULATORY_CARE_PROVIDER_SITE_OTHER): Payer: BC Managed Care – PPO

## 2020-02-21 DIAGNOSIS — I4891 Unspecified atrial fibrillation: Secondary | ICD-10-CM

## 2020-02-22 LAB — CUP PACEART REMOTE DEVICE CHECK
Date Time Interrogation Session: 20211023000035
Implantable Pulse Generator Implant Date: 20210512

## 2020-02-23 NOTE — Progress Notes (Signed)
Carelink Summary Report / Loop Recorder 

## 2020-02-23 NOTE — Addendum Note (Signed)
Addended by: Douglass Rivers D on: 02/23/2020 04:48 PM   Modules accepted: Level of Service

## 2020-03-06 ENCOUNTER — Other Ambulatory Visit (HOSPITAL_COMMUNITY): Payer: BC Managed Care – PPO

## 2020-03-06 ENCOUNTER — Inpatient Hospital Stay (HOSPITAL_COMMUNITY): Admission: RE | Admit: 2020-03-06 | Payer: BC Managed Care – PPO | Source: Ambulatory Visit

## 2020-03-06 ENCOUNTER — Encounter (HOSPITAL_COMMUNITY): Payer: Self-pay

## 2020-03-06 ENCOUNTER — Ambulatory Visit: Payer: BC Managed Care – PPO | Admitting: Thoracic Surgery (Cardiothoracic Vascular Surgery)

## 2020-03-06 ENCOUNTER — Ambulatory Visit (HOSPITAL_COMMUNITY): Payer: BC Managed Care – PPO

## 2020-03-08 ENCOUNTER — Inpatient Hospital Stay (HOSPITAL_COMMUNITY)
Admission: RE | Admit: 2020-03-08 | Payer: BC Managed Care – PPO | Source: Home / Self Care | Admitting: Thoracic Surgery (Cardiothoracic Vascular Surgery)

## 2020-03-08 ENCOUNTER — Encounter (HOSPITAL_COMMUNITY): Admission: RE | Payer: Self-pay | Source: Home / Self Care

## 2020-03-08 SURGERY — MAZE PROCEDURE, CARDIAC, MINIMALLY INVASIVE
Anesthesia: General

## 2020-03-13 ENCOUNTER — Encounter: Payer: Self-pay | Admitting: *Deleted

## 2020-03-20 ENCOUNTER — Telehealth (INDEPENDENT_AMBULATORY_CARE_PROVIDER_SITE_OTHER): Payer: BC Managed Care – PPO | Admitting: Family Medicine

## 2020-03-20 ENCOUNTER — Other Ambulatory Visit: Payer: Self-pay

## 2020-03-20 ENCOUNTER — Encounter: Payer: Self-pay | Admitting: Family Medicine

## 2020-03-20 DIAGNOSIS — J329 Chronic sinusitis, unspecified: Secondary | ICD-10-CM | POA: Diagnosis not present

## 2020-03-20 MED ORDER — AMOXICILLIN 875 MG PO TABS: 875.0000 mg | ORAL_TABLET | Freq: Two times a day (BID) | ORAL | 0 refills | Status: DC

## 2020-03-20 NOTE — Progress Notes (Signed)
MyChart Video Visit    Virtual Visit via Video Note   This visit type was conducted due to national recommendations for restrictions regarding the COVID-19 Pandemic (e.g. social distancing) in an effort to limit this patient's exposure and mitigate transmission in our community. This patient is at least at moderate risk for complications without adequate follow up. This format is felt to be most appropriate for this patient at this time. Physical exam was limited by quality of the video and audio technology used for the visit.   Patient location: home Provider location: office  I discussed the limitations of evaluation and management by telemedicine and the availability of in person appointments. The patient expressed understanding and agreed to proceed.  Patient: Jorge [name]   DOB: December 22, 1965   54 y.o. Male  MRN: [id number] Visit Date: 03/20/2020  Today's healthcare provider: Vernie Murders, PA   No chief complaint on file.  Subjective    HPI  The patient is a 54 year old male who presents via video visit for possible sinus infection.  The patient states that he often has sinus issues and can usually clear them with the use of his Singulair, Flonase and Asteline sprays.   He is concerned about this episode because he has a MAZE procedure scheduled on March 30, 2020.  His surgery had been cancelled by his surgeon earlier this month, so he wants to hopefully prevent it from being cancelled again due to him being sick.   Patient has been having sinus symptoms for 5 days.  He has been using nasal rinse, Flonase and Asteline.  He has also taken some Tylenol/Ibuprofen for some headache/body aches.  He denies having any fever.  Past Medical History:  Diagnosis Date   Allergic rhinitis, seasonal    Anticoagulant long-term use managed by cardiology   asa and xarelto   Bilateral nephrolithiasis    urologist--- dr Junious Silk ([place] office)   Coronary artery disease  cardiologist--- dr Angelena Form   nuclear stress test 10-18-2016 w/ normal perfusion;  cardiac cath 12-23-2017, PCI w/ DES to midLAD and mild RCA/ moderate CFx diease nonobstructive , normal LVF;  ETT 07-13-2019 negative for ischemia   History of kidney stones    Hyperlipidemia    Hypertension    followed by pcp and cardiology   OA (osteoarthritis)    knees   OSA on CPAP    12-31-2019  per pt uses cpap every night   PAF (paroxysmal atrial fibrillation) ([place]) 2011   followed by dr Rayann Heman (ep)---  first dx 2011;  s/p DCCV's and ablation x2 in 2015 and last one 03/ 2018, and s/p loop recorder 05/ 2021;   failed multaq, flecainide, and amiodarone   Right knee meniscal tear    S/P ablation of atrial fibrillation followed by dr Rayann Heman   x3  last one 2018   S/P drug eluting coronary stent placement 12/23/2017   x1  to midLAD   SOB (shortness of breath)    12-31-2019  per pt only when he has atrial fib , otherwise no issues with sob with activities   Status post placement of implantable loop recorder 09/08/2019   followed by dr Rayann Heman   Past Surgical History:  Procedure Laterality Date   [place] N/A 11/02/2013   PVI by Dr Rayann Heman   ATRIAL FIBRILLATION ABLATION N/A 03/29/2014   PVI and CTI by Dr Rayann Heman   ATRIAL FIBRILLATION ABLATION N/A 07/04/2016  Procedure: Atrial Fibrillation Ablation;  Surgeon: Thompson Grayer, MD;  Location: [place] [facility];  Service: Cardiovascular;  Laterality: N/A;   CARDIOVERSION N/A 10/15/2013   Procedure: CARDIOVERSION;  Surgeon: Fay Records, MD;  Location: [facility];  Service: Cardiovascular;  Laterality: N/A;   COLONOSCOPY  2016   CORONARY STENT INTERVENTION N/A 12/23/2017   Procedure: CORONARY STENT INTERVENTION;  Surgeon: Burnell Blanks, MD;  Location: [place] [facility];  Service: Cardiovascular;  Laterality: N/A;   EXTRACORPOREAL SHOCK WAVE LITHOTRIPSY  2005;  2013   implantable loop recorder  placement  09/08/2019   (done in office)   Medtronic Reveal Linq model LNQ 22 (SN [id number]) implantable loop recorder implanted in office by Dr Rayann Heman for afib management   INGUINAL HERNIA REPAIR Left 05/31/2015   Procedure: HERNIA REPAIR INGUINAL ADULT;  Surgeon: Robert Bellow, MD;  Location: [facility];  Service: General;  Laterality: Left;   KNEE ARTHROSCOPY Left 2009   @[facility]    KNEE ARTHROSCOPY WITH MEDIAL MENISECTOMY Right 01/04/2020   Procedure: KNEE ARTHROSCOPY WITH PARTIAL MEDIAL MENISECTOMY;  Surgeon: Nicholes Stairs, MD;  Location: [facility];  Service: Orthopedics;  Laterality: Right;   RHINOPLASTY  2006    @[facility]    RIGHT/LEFT HEART CATH AND CORONARY ANGIOGRAPHY N/A 12/23/2017   Procedure: RIGHT/LEFT HEART CATH AND CORONARY ANGIOGRAPHY;  Surgeon: Burnell Blanks, MD;  Location: [place] [facility];  Service: Cardiovascular;  Laterality: N/A;   RIGHT/LEFT HEART CATH AND CORONARY ANGIOGRAPHY N/A 02/02/2020   Procedure: RIGHT/LEFT HEART CATH AND CORONARY ANGIOGRAPHY;  Surgeon: Burnell Blanks, MD;  Location: [place] [facility];  Service: Cardiovascular;  Laterality: N/A;   ROBOT ASSISTED INGUINAL HERNIA REPAIR Left 05-01-2013   @[facility]    TEE WITHOUT CARDIOVERSION N/A 11/01/2013   Procedure: TRANSESOPHAGEAL ECHOCARDIOGRAM (TEE);  Surgeon: Candee Furbish, MD;  Location: [facility]COPY;  Service: Cardiovascular;  Laterality: N/A;   TEE WITHOUT CARDIOVERSION N/A 03/28/2014   Procedure: TRANSESOPHAGEAL ECHOCARDIOGRAM (TEE);  Surgeon: Larey Dresser, MD;  Location: [place];  Service: Cardiovascular;  Laterality: N/A;   TEE WITHOUT CARDIOVERSION N/A 02/02/2020   Procedure: TRANSESOPHAGEAL ECHOCARDIOGRAM (TEE);  Surgeon: Werner Lean, MD;  Location: [facility];  Service: Cardiovascular;  Laterality: N/A;   Social History   Tobacco Use   Smoking status: Never Smoker   Smokeless tobacco: Never Used  Vaping Use   Vaping Use: Never used    Substance Use Topics   Alcohol use: Yes    Comment: occasionally   Drug use: Never   Family History  Problem Relation Age of Onset   Cancer Mother        breast, cause of death   Heart attack Maternal Grandfather 53       Cause of death   Hypertension Maternal Grandfather    Heart failure Brother    CVA Paternal Grandmother        cause of death   Atrial fibrillation Maternal Grandmother    Colon polyps Other        Fam Hx   Kidney cancer Neg Hx    Bladder Cancer Neg Hx    Prostate cancer Neg Hx    Allergies  Allergen Reactions   Hydrocodone Nausea And Vomiting   Oxycodone Nausea And Vomiting      Medications: Outpatient Medications Prior to Visit  Medication Sig Note   aspirin 81 MG EC tablet Take 81 mg by mouth daily.  03/20/2020: Currently on but will stop in 8 days  for procedure   azelastine (ASTELIN) 0.1 % nasal spray Place 1 spray into both nostrils 2 (two) times daily. (Patient taking differently: Place 1 spray into both nostrils daily as needed (allergies.). )    diltiazem (CARDIZEM CD) 240 MG 24 hr capsule Take 1 capsule (240 mg total) by mouth daily. (Patient taking differently: Take 240 mg by mouth every evening. )    dofetilide (TIKOSYN) 500 MCG capsule Take 1 capsule (500 mcg total) by mouth 2 (two) times daily.    fluticasone (FLONASE) 50 MCG/ACT nasal spray Place 2 sprays into both nostrils daily.    ibuprofen (ADVIL) 200 MG tablet Take 800 mg by mouth every 6 (six) hours as needed for headache or moderate pain.    losartan (COZAAR) 50 MG tablet Take 1 tablet (50 mg total) by mouth daily. (Patient taking differently: Take 50 mg by mouth every evening. )    montelukast (SINGULAIR) 10 MG tablet Take 1 tablet (10 mg total) by mouth at bedtime. (Patient taking differently: Take 10 mg by mouth daily as needed (allergies.). )    rosuvastatin (CRESTOR) 40 MG tablet Take 1 tablet (40 mg total) by mouth daily. (Patient taking differently: Take 40  mg by mouth every evening. )    XARELTO 20 MG TABS tablet Take 1 tablet (20 mg total) by mouth daily with supper. 03/20/2020: Currently taking but will stop in 8 days for procedure   aspirin-acetaminophen-caffeine (EXCEDRIN MIGRAINE) 250-250-65 MG tablet Take 2 tablets by mouth every 6 (six) hours as needed for headache. (Patient not taking: Reported on 03/20/2020)    tamsulosin (FLOMAX) 0.4 MG CAPS capsule Take 1 capsule (0.4 mg total) by mouth daily after supper. (Patient not taking: Reported on 03/20/2020)    traMADol (ULTRAM) 50 MG tablet Take 1 tablet (50 mg total) by mouth every 6 (six) hours as needed. (Patient taking differently: Take 50 mg by mouth every 6 (six) hours as needed (kidney stone pain.). )    No facility-administered medications prior to visit.    Review of Systems  Constitutional: Positive for diaphoresis. Negative for fever.  HENT: Positive for congestion, ear pain, postnasal drip, sinus pressure, sinus pain, sore throat and trouble swallowing. Negative for hearing loss, rhinorrhea, sneezing and tinnitus.   Respiratory: Positive for cough (non pruductive). Negative for shortness of breath.   Neurological: Positive for headaches. Negative for dizziness.      Objective    There were no vitals taken for this visit.   Physical Exam: WDWN male in no apparent distress.  Head: Normocephalic, atraumatic. Neck: Supple, NROM Respiratory: No apparent distress Psych: Normal mood and affect Throat: No significant erythema or exudates visible during video call.    Assessment & Plan     1. Sinusitis, unspecified chronicity, unspecified location Mild sinus congestion, sore throat and ear discomfort the past 4-5 days. No fever. States this is the same symptoms he has had with past sinusitis and using Flonase with Azelastine nasal sprays. Continue sinus irrigation as needed and add Amoxicillin (scheduled for a MAZE procedure for A. Fib). - amoxicillin (AMOXIL) 875 MG  tablet; Take 1 tablet (875 mg total) by mouth 2 (two) times daily.  Dispense: 20 tablet; Refill: 0   No follow-ups on file.     I discussed the assessment and treatment plan with the patient. The patient was provided an opportunity to ask questions and all were answered. The patient agreed with the plan and demonstrated an understanding of the instructions.   The patient  was advised to call back or seek an in-person evaluation if the symptoms worsen or if the condition fails to improve as anticipated.  I provided 20 minutes of non-face-to-face time during this encounter.  Andres Shad, PA, have reviewed all documentation for this visit. The documentation on 03/20/20 for the exam, diagnosis, procedures, and orders are all accurate and complete.   Vernie Murders, [place] [phone] (phone) [phone] (fax)  [place]

## 2020-03-24 NOTE — Progress Notes (Signed)
Your procedure is scheduled on Thursday, December 2nd.  Report to [facility] Main Entrance "A" at 5:30 A.M., and check in at the Admitting office.  Call this number if you have problems the morning of surgery:  [phone]  Call [phone] if you have any questions prior to your surgery date Monday-Friday 8am-4pm   Remember:  Do not eat or drink after midnight the night before your surgery    Take these medicines the morning of surgery with A SIP OF WATER  dofetilide (TIKOSYN)  fluticasone (FLONASE)/nasal spray  If needed: azelastine (ASTELIN)/nasal spray,    Follow your surgeon's instructions on when to stop Aspirin and Xarelto.  If no instructions were given by your surgeon then you will need to call the office to get those instructions.    As of today, STOP taking aspirin-acetaminophen-caffeine (EXCEDRIN MIGRAINE), Aleve, Naproxen, Ibuprofen, Motrin, Advil, Goody's, BC's, all herbal medications, fish oil, and all vitamins.                     Do not wear jewelry.            Do not wear lotions, powders, colognes, or deodorant.            Men may shave face and neck.            Do not bring valuables to the hospital.            [facility] is not responsible for any belongings or valuables.  Do NOT Smoke (Tobacco/Vaping) or drink Alcohol 24 hours prior to your procedure If you use a CPAP at night, you may bring all equipment for your overnight stay.   Contacts, glasses, dentures or bridgework may not be worn into surgery.      For patients admitted to the hospital, discharge time will be determined by your treatment team.   Patients discharged the day of surgery will not be allowed to drive home, and someone needs to stay with them for 24 hours.  Special instructions:   [facility]- Preparing For Surgery  Before surgery, you can play an important role. Because skin is not sterile, your skin needs to be as free of germs as possible. You can reduce the number of germs on  your skin by washing with CHG (chlorahexidine gluconate) Soap before surgery.  CHG is an antiseptic cleaner which kills germs and bonds with the skin to continue killing germs even after washing.    Oral Hygiene is also important to reduce your risk of infection.  Remember - BRUSH YOUR TEETH THE MORNING OF SURGERY WITH YOUR REGULAR TOOTHPASTE  Please do not use if you have an allergy to CHG or antibacterial soaps. If your skin becomes reddened/irritated stop using the CHG.  Do not shave (including legs and underarms) for at least 48 hours prior to first CHG shower. It is OK to shave your face.  Please follow these instructions carefully.   1. Shower the NIGHT BEFORE SURGERY and the MORNING OF SURGERY with CHG Soap.   2. If you chose to wash your hair, wash your hair first as usual with your normal shampoo.  3. After you shampoo, rinse your hair and body thoroughly to remove the shampoo.  4. Use CHG as you would any other liquid soap. You can apply CHG directly to the skin and wash gently with a scrungie or a clean washcloth.   5. Apply the CHG Soap to your body ONLY FROM THE NECK  DOWN.  Do not use on open wounds or open sores. Avoid contact with your eyes, ears, mouth and genitals (private parts). Wash Face and genitals (private parts)  with your normal soap.   6. Wash thoroughly, paying special attention to the area where your surgery will be performed.  7. Thoroughly rinse your body with warm water from the neck down.  8. DO NOT shower/wash with your normal soap after using and rinsing off the CHG Soap.  9. Pat yourself dry with a CLEAN TOWEL.  10. Wear CLEAN PAJAMAS to bed the night before surgery  11. Place CLEAN SHEETS on your bed the night of your first shower and DO NOT SLEEP WITH PETS.  Day of Surgery: Wear Clean/Comfortable clothing the morning of surgery Do not apply any deodorants/lotions.   Remember to brush your teeth WITH YOUR REGULAR TOOTHPASTE.   Please read over  the following fact sheets that you were given.

## 2020-03-27 ENCOUNTER — Encounter (HOSPITAL_COMMUNITY): Payer: Self-pay

## 2020-03-27 ENCOUNTER — Other Ambulatory Visit: Payer: Self-pay

## 2020-03-27 ENCOUNTER — Ambulatory Visit: Payer: BC Managed Care – PPO | Admitting: Thoracic Surgery (Cardiothoracic Vascular Surgery)

## 2020-03-27 ENCOUNTER — Ambulatory Visit (HOSPITAL_COMMUNITY)
Admission: RE | Admit: 2020-03-27 | Discharge: 2020-03-27 | Disposition: A | Discharge status: Home / Self Care | Payer: BC Managed Care – PPO | Source: Ambulatory Visit | Attending: Thoracic Surgery (Cardiothoracic Vascular Surgery) | Admitting: Thoracic Surgery (Cardiothoracic Vascular Surgery)

## 2020-03-27 ENCOUNTER — Ambulatory Visit (INDEPENDENT_AMBULATORY_CARE_PROVIDER_SITE_OTHER): Payer: BC Managed Care – PPO

## 2020-03-27 ENCOUNTER — Other Ambulatory Visit: Payer: Self-pay | Admitting: *Deleted

## 2020-03-27 ENCOUNTER — Encounter (HOSPITAL_COMMUNITY)
Admission: RE | Admit: 2020-03-27 | Discharge: 2020-03-27 | Disposition: A | Discharge status: Home / Self Care | Payer: BC Managed Care – PPO | Source: Ambulatory Visit | Attending: Thoracic Surgery (Cardiothoracic Vascular Surgery) | Admitting: Thoracic Surgery (Cardiothoracic Vascular Surgery)

## 2020-03-27 ENCOUNTER — Other Ambulatory Visit (HOSPITAL_COMMUNITY)
Admission: RE | Admit: 2020-03-27 | Discharge: 2020-03-27 | Disposition: A | Discharge status: Home / Self Care | Payer: BC Managed Care – PPO | Source: Ambulatory Visit | Attending: Thoracic Surgery (Cardiothoracic Vascular Surgery) | Admitting: Thoracic Surgery (Cardiothoracic Vascular Surgery)

## 2020-03-27 ENCOUNTER — Encounter: Payer: Self-pay | Admitting: Thoracic Surgery (Cardiothoracic Vascular Surgery)

## 2020-03-27 ENCOUNTER — Ambulatory Visit (HOSPITAL_BASED_OUTPATIENT_CLINIC_OR_DEPARTMENT_OTHER)
Admission: RE | Admit: 2020-03-27 | Discharge: 2020-03-27 | Disposition: A | Discharge status: Home / Self Care | Payer: BC Managed Care – PPO | Source: Ambulatory Visit | Attending: Thoracic Surgery (Cardiothoracic Vascular Surgery) | Admitting: Thoracic Surgery (Cardiothoracic Vascular Surgery)

## 2020-03-27 VITALS — BP 117/87 | HR 65 | Resp 18 | Ht 68.0 in

## 2020-03-27 DIAGNOSIS — I480 Paroxysmal atrial fibrillation: Secondary | ICD-10-CM

## 2020-03-27 DIAGNOSIS — I4891 Unspecified atrial fibrillation: Secondary | ICD-10-CM | POA: Insufficient documentation

## 2020-03-27 DIAGNOSIS — I4819 Other persistent atrial fibrillation: Secondary | ICD-10-CM | POA: Diagnosis not present

## 2020-03-27 DIAGNOSIS — Z20822 Contact with and (suspected) exposure to covid-19: Secondary | ICD-10-CM | POA: Insufficient documentation

## 2020-03-27 DIAGNOSIS — Z01818 Encounter for other preprocedural examination: Secondary | ICD-10-CM | POA: Insufficient documentation

## 2020-03-27 HISTORY — DX: Pneumonia, unspecified organism: J18.9

## 2020-03-27 LAB — URINALYSIS, ROUTINE W REFLEX MICROSCOPIC
Bilirubin Urine: NEGATIVE
Glucose, UA: NEGATIVE mg/dL
Hgb urine dipstick: NEGATIVE
Ketones, ur: NEGATIVE mg/dL
Leukocytes,Ua: NEGATIVE
Nitrite: NEGATIVE
Protein, ur: NEGATIVE mg/dL
Specific Gravity, Urine: 1.01 (ref 1.005–1.030)
pH: 7 (ref 5.0–8.0)

## 2020-03-27 LAB — TYPE AND SCREEN
ABO/RH(D): O NEG
Antibody Screen: NEGATIVE

## 2020-03-27 LAB — CUP PACEART REMOTE DEVICE CHECK
Date Time Interrogation Session: 20211129090624
Implantable Pulse Generator Implant Date: 20210512

## 2020-03-27 LAB — APTT: aPTT: 30 seconds (ref 24–36)

## 2020-03-27 LAB — SURGICAL PCR SCREEN
MRSA, PCR: NEGATIVE
Staphylococcus aureus: NEGATIVE

## 2020-03-27 LAB — COMPREHENSIVE METABOLIC PANEL
ALT: 25 U/L (ref 0–44)
AST: 23 U/L (ref 15–41)
Albumin: 4.3 g/dL (ref 3.5–5.0)
Alkaline Phosphatase: 55 U/L (ref 38–126)
Anion gap: 13 (ref 5–15)
BUN: 15 mg/dL (ref 6–20)
CO2: 21 mmol/L — ABNORMAL LOW (ref 22–32)
Calcium: 9.3 mg/dL (ref 8.9–10.3)
Chloride: 104 mmol/L (ref 98–111)
Creatinine, Ser: 0.75 mg/dL (ref 0.61–1.24)
GFR, Estimated: >60 mL/min (ref >60)
Glucose, Bld: 96 mg/dL (ref 70–99)
Potassium: 3.8 mmol/L (ref 3.5–5.1)
Sodium: 138 mmol/L (ref 135–145)
Total Bilirubin: 0.7 mg/dL (ref 0.3–1.2)
Total Protein: 6.9 g/dL (ref 6.5–8.1)

## 2020-03-27 LAB — BLOOD GAS, ARTERIAL
Acid-Base Excess: 2.4 mmol/L — ABNORMAL HIGH (ref 0.0–2.0)
Bicarbonate: 26 mmol/L (ref 20.0–28.0)
Drawn by: 58793
FIO2: 21
O2 Saturation: 97 %
Patient temperature: 37
pCO2 arterial: 37.3 mmHg (ref 32.0–48.0)
pH, Arterial: 7.457 — ABNORMAL HIGH (ref 7.350–7.450)
pO2, Arterial: 87.1 mmHg (ref 83.0–108.0)

## 2020-03-27 LAB — PROTIME-INR
INR: 1.1 (ref 0.8–1.2)
Prothrombin Time: 13.7 seconds (ref 11.4–15.2)

## 2020-03-27 LAB — CBC
HCT: 38.9 % — ABNORMAL LOW (ref 39.0–52.0)
Hemoglobin: 13.3 g/dL (ref 13.0–17.0)
MCH: 29.8 pg (ref 26.0–34.0)
MCHC: 34.2 g/dL (ref 30.0–36.0)
MCV: 87.2 fL (ref 80.0–100.0)
Platelets: 247 10*3/uL (ref 150–400)
RBC: 4.46 MIL/uL (ref 4.22–5.81)
RDW: 12.3 % (ref 11.5–15.5)
WBC: 7 10*3/uL (ref 4.0–10.5)
nRBC: 0 % (ref 0.0–0.2)

## 2020-03-27 LAB — HEMOGLOBIN A1C
Hgb A1c MFr Bld: 5.3 % (ref 4.8–5.6)
Mean Plasma Glucose: 105.41 mg/dL

## 2020-03-27 NOTE — Progress Notes (Signed)
VASCULAR LAB    Pre CABG Dopplers have been performed.  See CV proc for preliminary results.   Blima Jaimes, RVT 03/27/2020, 1:44 PM

## 2020-03-27 NOTE — Patient Instructions (Signed)
Do not take Xarelto  Continue taking all other medications without change through the day before surgery.  Make sure to bring all of your medications with you when you come for your Pre-Admission Testing appointment at [facility] Short-Stay Department.  Have nothing to eat or drink after midnight the night before surgery.  On the morning of surgery take only Tikosyn and Cardizem with a sip of water.  At your appointment for Pre-Admission Testing at the [facility] Short-Stay Department you will be asked to sign permission forms for your upcoming surgery.  By definition your signature on these forms implies that you and/or your designee provide full informed consent for your planned surgical procedure(s), that alternative treatment options have been discussed, that you understand and accept any and all potential risks, and that you have some understanding of what to expect for your post-operative convalescence.  For any major cardiac surgical procedure potential operative risks include but are not limited to at least some risk of death, stroke or other neurologic complication, myocardial infarction, congestive heart failure, respiratory failure, renal failure, bleeding requiring blood transfusion and/or reexploration, irregular heart rhythm, heart block or bradycardia requiring permanent pacemaker, pneumonia, pericardial effusion, pleural effusion, wound infection, pulmonary embolus or other thromboembolic complication, chronic pain, or other complications related to the specific procedure(s) performed.  Please call to schedule a follow-up appointment in our office prior to surgery if you have any unresolved questions about your planned surgical procedure, the associated risks, alternative treatment options, and/or expectations for your post-operative recovery.

## 2020-03-27 NOTE — Progress Notes (Signed)
PCP - Dr. Miguel Aschoff Cardiologist - Dr. Thompson Grayer  PPM/ICD - Denies  Patient has a loop recorder implant.  Chest x-ray - 03/27/20 EKG - 03/27/20 Stress Test - 07/13/19 ECHO - 02/02/20 Cardiac Cath - 02/02/20  Sleep Study - Yes CPAP - Yes  Patient denies having diabetes.  Blood Thinner Instructions: LD of Xarelto - 03/22/20 Aspirin Instructions: LD of ASA - 03/22/20  ERAS Protcol - No  COVID TEST- 03/27/20   Coronavirus Screening  Have you experienced the following symptoms:  Cough yes/no: No Fever (>100.5F)  yes/no: No Runny nose yes/no: No Sore throat yes/no: No Difficulty breathing/shortness of breath  yes/no: No  Have you or a family member traveled in the last 14 days and where? yes/no: No   If the patient indicates "YES" to the above questions, their PAT will be rescheduled to limit the exposure to others and, the surgeon will be notified. THE PATIENT WILL NEED TO BE ASYMPTOMATIC FOR 14 DAYS.   If the patient is not experiencing any of these symptoms, the PAT nurse will instruct them to NOT bring anyone with them to their appointment since they may have these symptoms or traveled as well.   Please remind your patients and families that hospital visitation restrictions are in effect and the importance of the restrictions.     Anesthesia review: Yes, cardiac hx  Patient denies shortness of breath, fever, cough and chest pain at PAT appointment   All instructions explained to the patient, with a verbal understanding of the material. Patient agrees to go over the instructions while at home for a better understanding. Patient also instructed to self quarantine after being tested for COVID-19. The opportunity to ask questions was provided.

## 2020-03-27 NOTE — Progress Notes (Signed)
[address]       [place] [address]             [phone]     CARDIOTHORACIC SURGERY OFFICE NOTE  Referring Provider is Thompson Grayer, MD Primary Cardiologist is Lauree Chandler, MD PCP is Jerrol Banana., MD   HPI:  Patient is a 54 year old male with history of coronary artery disease status post PCI and stenting of the left anterior descending coronary artery in 2019, hypertension, obstructive sleep apnea on CPAP, hyperlipidemia, and chronic recurrent paroxysmal atrial fibrillation on long-term anticoagulation who returns to the office today  with tentative plans to proceed with minimally invasive maze procedure later this week.  He was last seen here in our office on February 07, 2020 at which time we made tentative plans for surgery.  The patient reports no new problems or complaints over the last few weeks.  However, he was recently treated with oral amoxicillin for a possible upper respiratory tract infection related to a persistent cough without fever.  Symptoms resolved and the patient has just recently completed his prescription.  He has not been exposed to any persons with known or suspected COVID-19 infection.  He states that otherwise he has remained stable although he notes that he is mostly out of rhythm and persistent atrial fibrillation.  He stopped taking Xarelto and aspirin last week in anticipation of his upcoming surgery.  He has no other complaints.   Current Outpatient Medications  Medication Sig Dispense Refill  . amoxicillin (AMOXIL) 875 MG tablet Take 1 tablet (875 mg total) by mouth 2 (two) times daily. 20 tablet 0  . aspirin 81 MG EC tablet Take 81 mg by mouth daily.     Marland Kitchen aspirin-acetaminophen-caffeine (EXCEDRIN MIGRAINE) 250-250-65 MG tablet Take 2 tablets by mouth every 6 (six) hours as needed for headache.     Marland Kitchen azelastine (ASTELIN) 0.1 % nasal spray Place 1 spray into both nostrils 2 (two) times daily. (Patient taking  differently: Place 1 spray into both nostrils 2 (two) times daily as needed for allergies. ) 30 mL 12  . diltiazem (CARDIZEM CD) 240 MG 24 hr capsule Take 1 capsule (240 mg total) by mouth daily. (Patient taking differently: Take 240 mg by mouth every evening. ) 90 capsule 3  . dofetilide (TIKOSYN) 500 MCG capsule Take 1 capsule (500 mcg total) by mouth 2 (two) times daily. 180 capsule 3  . fluticasone (FLONASE) 50 MCG/ACT nasal spray Place 2 sprays into both nostrils daily. 16 g 3  . ibuprofen (ADVIL) 200 MG tablet Take 800 mg by mouth every 6 (six) hours as needed for headache or moderate pain.    Marland Kitchen losartan (COZAAR) 50 MG tablet Take 1 tablet (50 mg total) by mouth daily. (Patient taking differently: Take 50 mg by mouth every evening. ) 30 tablet 11  . montelukast (SINGULAIR) 10 MG tablet Take 1 tablet (10 mg total) by mouth at bedtime. 90 tablet 3  . rosuvastatin (CRESTOR) 40 MG tablet Take 1 tablet (40 mg total) by mouth daily. (Patient taking differently: Take 40 mg by mouth every evening. ) 90 tablet 2  . tamsulosin (FLOMAX) 0.4 MG CAPS capsule Take 1 capsule (0.4 mg total) by mouth daily after supper. 30 capsule 3  . traMADol (ULTRAM) 50 MG tablet Take 1 tablet (50 mg total) by mouth every 6 (six) hours as needed. 15 tablet 0  . XARELTO 20 MG TABS tablet Take 1 tablet (20  mg total) by mouth daily with supper. (Patient taking differently: Take 20 mg by mouth daily with supper. ) 90 tablet 1   No current facility-administered medications for this visit.      Physical Exam:   BP 117/87 (BP Location: Left Arm, Patient Position: Sitting)   Pulse 65   Resp 18   Ht 5\' 8"  (1.727 m)   SpO2 92% Comment: RA with mask on  BMI 30.08 kg/m   General:  Well-appearing  Chest:   Clear to auscultation  CV:   Tachycardic with irregular rhythm  Incisions:  n/a  Abdomen:  Soft nontender  Extremities:  Warm and well-perfused  Diagnostic Tests:  n/a   Impression:  Patient has chronic recurrent  paroxysmal atrial fibrillation which is now approaching longstanding persistent atrial fibrillation and has failed pharmacologic therapy on multiple drugs as well as 2 previous attempts at catheter-based ablation. Recent interrogation of implantable loop recorder documents that the patient remains in atrial fibrillation more than 70% of the time. He remains symptomatic with primary complaints of exertional shortness of breath and fatigue as well as occasional chest tightness,palpitations and dizzy spells.  I have personally reviewed the patient's recent TEE, diagnostic cardiac catheterization, and CT angiogram.  The patient has dilated left atrium with no evidence for left atrial clot.  There is normal left ventricular systolic function.  There is mild aortic insufficiency and mild mitral regurgitation.  Catheterization is notable for widely patent stent in the left anterior descending coronary artery and otherwise moderate nonobstructive coronary artery disease.  The patient CT angiogram has not yet been officially read by a radiologist.  However, there do not appear to be any contraindications to peripheral cannulation for surgery.    Plan:  I have again discussed the natural history of atrial fibrillation at length with the patient and his wife in the office today. We discussed the differences between primary lone atrial fibrillation and atrial fibrillation associated with other cardiac or respiratory problems. We discussed treatment strategies including rate control with long-term anticoagulation, pharmacologic therapy, catheter-based therapies, and a variety of surgical options. We discussed the rationale for surgical Maze procedure andour local experience for patients both with and without need for concomitant surgical procedures. We discussed alternative surgical approaches including both conventional sternotomy and minithoracotomy approach for a full Maze procedure versus other less  invasive surgical approaches without use of cardiopulmonary bypass such as the convergent procedure. Expectations for the patient's postoperative convalescence and long-term freedom from atrial fibrillation have been discussed.   The patient understands and accepts all potential risks of surgery including but not limited to risk of death, stroke or other neurologic complication, myocardial infarction, congestive heart failure, respiratory failure, renal failure, bleeding requiring transfusion and/or reexploration, arrhythmia, infection or other wound complications, pneumonia, pleural and/or pericardial effusion, pulmonary embolus, aortic dissection or other major vascular complication, or late recurrence of atrial fibrillation or atypical atrial flutter.  Specific risks potentially related to the minimally-invasive approach were discussed at length, including but not limited to risk of conversion to full or partial sternotomy, aortic dissection or other major vascular complication, unilateral acute lung injury or pulmonary edema, phrenic nerve dysfunction or paralysis, rib fracture, chronic pain, lung hernia, or lymphocele. All of their questions have been answered.    I spent in excess of 15 minutes during the conduct of this office consultation and >50% of this time involved direct face-to-face encounter with the patient for counseling and/or coordination of their care.    Braulio Conte  Keturah Barre, MD 03/27/2020 3:30 PM

## 2020-03-28 LAB — SARS CORONAVIRUS 2 (TAT 6-24 HRS): SARS Coronavirus 2: NEGATIVE

## 2020-03-28 NOTE — Progress Notes (Signed)
Anesthesia Chart Review:  Case: [id number] Date/Time: 03/30/20 0715   Procedures:      MINIMALLY INVASIVE MAZE PROCEDURE (N/A )     TRANSESOPHAGEAL ECHOCARDIOGRAM (TEE) (N/A )   Anesthesia type: General   Pre-op diagnosis: AFIB   Location: [facility] OR ROOM 15 / [place] [facility]   Surgeons: Rexene Alberts, MD      DISCUSSION: Patient is a 54 year old male scheduled for the above procedure.  History includes never smoker, CAD (s/p DES mLAd 12/23/17), chronic recurrent PAF (s/p DCCV 2011 & 10/15/13; s/p ablation 11/02/13, 03/29/14 & 07/04/16; , afib related dyspnea, OSA (CPAP), HTN, HLD, nephrolithiasis. S/p Medtronic LINQ II loop records 09/08/19.   Last visit with EP Dr. Rayann Heman on 02/16/20. Patient already with plans for MAZE at that time time. He noted, "On ILR, he has had episodes of afib with average HR of 178 bpm and max heart rates of well over 200 bpm.  I do think that this is the best strategy for him at this time.  afib burden is 71%." 4 week post-op visit planned.  Evaluated by Dr. Roxy Manns on 03/27/20. Instructed to continue to hold Xarelto and to take only Tikoysn and Cardizem on the morning of surgery. Last Xarelto and ASA 03/22/20.   03/27/2020 presurgical COVID-19 test negative.  Anesthesia team to evaluate on the day of surgery.   VS: BP (!) 136/99   Pulse (!) 124   Temp 36.7 C (Oral)   Resp 18   Ht 5\' 8"  (1.727 m)   Wt 89.7 kg   SpO2 98%   BMI 30.08 kg/m  HR on 03/27/20 2:31 PM EKG was 131 bpm and  documented as 65 bpm at his 03/27/20 4:00 PM visit at [facility].   PROVIDERS: Jerrol Banana., MD is PCP  Lauree Chandler, MD is cardiologist Thompson Grayer, MD is EP cardiologist Festus Aloe, MD is urologist   LABS: Labs reviewed: Acceptable for surgery. (all labs ordered are listed, but only abnormal results are displayed)  Labs Reviewed  BLOOD GAS, ARTERIAL - Abnormal; Notable for the following components:      Result Value   pH, Arterial 7.457 (*)    Acid-Base Excess 2.4  (*)    All other components within normal limits  CBC - Abnormal; Notable for the following components:   HCT 38.9 (*)    All other components within normal limits  COMPREHENSIVE METABOLIC PANEL - Abnormal; Notable for the following components:   CO2 21 (*)    All other components within normal limits  URINALYSIS, ROUTINE W REFLEX MICROSCOPIC - Abnormal; Notable for the following components:   Color, Urine STRAW (*)    All other components within normal limits  SURGICAL PCR SCREEN  APTT  HEMOGLOBIN A1C  PROTIME-INR  TYPE AND SCREEN     IMAGES: CXR 03/27/20: FINDINGS: No focal consolidation, pleural effusion, pneumothorax. The cardiac silhouette is within limits. No acute osseous pathology. A loop recorder device noted. IMPRESSION: No active cardiopulmonary disease.  CTA chest/abd/pelvis 02/07/20: IMPRESSION: 1. Aortic atherosclerosis, in addition to 3 vessel coronary artery disease. Please note that although the presence of coronary artery calcium documents the presence of coronary artery disease, the severity of this disease and any potential stenosis cannot be assessed on this non-gated CT examination. Assessment for potential risk factor modification, dietary therapy or pharmacologic therapy may be warranted, if clinically indicated. 2. Multiple tiny 2-4 mm pulmonary nodules scattered throughout the lungs bilaterally, nonspecific, but statistically likely benign. No follow-up needed  if patient is low-risk (and has no known or suspected primary neoplasm). Non-contrast chest CT can be considered in 12 months if patient is high-risk. This recommendation follows the consensus statement: Guidelines for Management of Incidental Pulmonary Nodules Detected on CT Images: From the Fleischner Society 2017; Radiology 2017; 284:228-243. 3. Patchy areas of ground-glass attenuation are also noted in the lungs, most evident in the left upper lobe. These are nonspecific, but are  favored to be of infectious or inflammatory etiology. Attention on any future follow-up studies is recommended to ensure resolution of these findings. 4. Nonobstructive calculi in the collecting systems of both kidneys measuring up to 4 mm in the lower pole collecting system of left kidney. 5. Additional incidental findings, as above. [see full report]   EKG: 03/27/20: Ventricular rate 131 bpm Atrial fibrillation with rapid ventricular response Abnormal ECG No significant change since last tracing Confirmed by Sherren Mocha [id number]) on 03/27/2020 8:31:30 PM   CV: Carotid US 03/27/20: Summary:  - Right Carotid: The extracranial vessels were near-normal with only minimal wall thickening or plaque.  - Left Carotid: The extracranial vessels were near-normal with only minimal wall thickening or plaque.  - Vertebrals: Bilateral vertebral arteries demonstrate antegrade flow.  - Subclavians: Normal flow hemodynamics were seen in bilateral subclavian arteries.    Cardiac cath 02/02/20:  Colon Flattery 1st Diag lesion is 20% stenosed.  Previously placed Prox LAD drug eluting stent is widely patent.  Balloon angioplasty was performed.  Ost 2nd Mrg lesion is 30% stenosed.  Prox Cx lesion is 30% stenosed.  Mid Cx lesion is 50% stenosed.  Mid RCA lesion is 30% stenosed.  RPAV lesion is 40% stenosed. 1. Patent mid LAD stent with no restenosis 2. Mild to moderate mid Circumflex stenosis, unchanged from last cath 3. Mild to moderate right posterolateral artery stenosis - Recommendations: Continue medical management of CAD. No further ischemic workup. Proceed with planned surgical procedure.    TEE 02/02/20: IMPRESSIONS  1. Left ventricular ejection fraction, by estimation, is 55 to 60%. The  left ventricle has normal function. The left ventricle has no regional  wall motion abnormalities. Left ventricular diastolic function could not  be evaluated.  2. Right ventricular systolic function  is normal. The right ventricular  size is normal. Likely normal right ventricular thickness.  3. Left atrial size was dilated. No left atrial/left atrial appendage  thrombus was detected.  4. The mitral valve is normal in structure. Mild mitral valve  regurgitation.  5. The aortic valve is tricuspid. Aortic valve regurgitation is mild.  - Conclusion(s)/Recommendation(s): Normal biventricular function without  evidence of hemodynamically significant valvular heart disease.    Past Medical History:  Diagnosis Date  . Allergic rhinitis, seasonal   . Anticoagulant long-term use managed by cardiology   asa and xarelto  . Bilateral nephrolithiasis    urologist--- dr Junious Silk ([place] office)  . Coronary artery disease cardiologist--- dr Angelena Form   nuclear stress test 10-18-2016 w/ normal perfusion;  cardiac cath 12-23-2017, PCI w/ DES to midLAD and mild RCA/ moderate CFx diease nonobstructive , normal LVF;  ETT 07-13-2019 negative for ischemia  . Dysrhythmia    Afib  . History of kidney stones   . Hyperlipidemia   . Hypertension    followed by pcp and cardiology  . OA (osteoarthritis)    knees  . OSA on CPAP    12-31-2019  per pt uses cpap every night  . PAF (paroxysmal atrial fibrillation) ([place]) 2011   followed by dr allred (  ep)---  first dx 2011;  s/p DCCV's and ablation x2 in 2015 and last one 03/ 2018, and s/p loop recorder 05/ 2021;   failed multaq, flecainide, and amiodarone  . Pneumonia   . Right knee meniscal tear   . S/P ablation of atrial fibrillation followed by dr allred   x3  last one 2018  . S/P drug eluting coronary stent placement 12/23/2017   x1  to midLAD  . SOB (shortness of breath)    12-31-2019  per pt only when he has atrial fib , otherwise no issues with sob with activities  . Status post placement of implantable loop recorder 09/08/2019   followed by dr Rayann Heman    Past Surgical History:  Procedure Laterality Date  . APPENDECTOMY  1975  . ATRIAL  FIBRILLATION ABLATION N/A 11/02/2013   PVI by Dr Rayann Heman  . ATRIAL FIBRILLATION ABLATION N/A 03/29/2014   PVI and CTI by Dr Rayann Heman  . ATRIAL FIBRILLATION ABLATION N/A 07/04/2016   Procedure: Atrial Fibrillation Ablation;  Surgeon: Thompson Grayer, MD;  Location: [place] [facility];  Service: Cardiovascular;  Laterality: N/A;  . CARDIOVERSION N/A 10/15/2013   Procedure: CARDIOVERSION;  Surgeon: Fay Records, MD;  Location: [place];  Service: Cardiovascular;  Laterality: N/A;  . COLONOSCOPY  2016  . CORONARY STENT INTERVENTION N/A 12/23/2017   Procedure: CORONARY STENT INTERVENTION;  Surgeon: Burnell Blanks, MD;  Location: [place] [facility];  Service: Cardiovascular;  Laterality: N/A;  . EXTRACORPOREAL SHOCK WAVE LITHOTRIPSY  2005;  2013  . implantable loop recorder placement  09/08/2019   (done in office)   Medtronic Reveal Linq model LNQ 22 (SN [id number]) implantable loop recorder implanted in office by Dr Rayann Heman for afib management  . INGUINAL HERNIA REPAIR Left 05/31/2015   Procedure: HERNIA REPAIR INGUINAL ADULT;  Surgeon: Robert Bellow, MD;  Location: [facility];  Service: General;  Laterality: Left;  . KNEE ARTHROSCOPY Left 2009   @[facility]   . KNEE ARTHROSCOPY WITH MEDIAL MENISECTOMY Right 01/04/2020   Procedure: KNEE ARTHROSCOPY WITH PARTIAL MEDIAL MENISECTOMY;  Surgeon: Nicholes Stairs, MD;  Location: [facility];  Service: Orthopedics;  Laterality: Right;  . RHINOPLASTY  2006    @[facility]   . RIGHT/LEFT HEART CATH AND CORONARY ANGIOGRAPHY N/A 12/23/2017   Procedure: RIGHT/LEFT HEART CATH AND CORONARY ANGIOGRAPHY;  Surgeon: Burnell Blanks, MD;  Location: [place] [facility];  Service: Cardiovascular;  Laterality: N/A;  . RIGHT/LEFT HEART CATH AND CORONARY ANGIOGRAPHY N/A 02/02/2020   Procedure: RIGHT/LEFT HEART CATH AND CORONARY ANGIOGRAPHY;  Surgeon: Burnell Blanks, MD;  Location: [place] [facility];  Service: Cardiovascular;  Laterality: N/A;  . ROBOT  ASSISTED INGUINAL HERNIA REPAIR Left 05-01-2013   @[facility]   . TEE WITHOUT CARDIOVERSION N/A 11/01/2013   Procedure: TRANSESOPHAGEAL ECHOCARDIOGRAM (TEE);  Surgeon: Candee Furbish, MD;  Location: [facility];  Service: Cardiovascular;  Laterality: N/A;  . TEE WITHOUT CARDIOVERSION N/A 03/28/2014   Procedure: TRANSESOPHAGEAL ECHOCARDIOGRAM (TEE);  Surgeon: Larey Dresser, MD;  Location: [place];  Service: Cardiovascular;  Laterality: N/A;  . TEE WITHOUT CARDIOVERSION N/A 02/02/2020   Procedure: TRANSESOPHAGEAL ECHOCARDIOGRAM (TEE);  Surgeon: Werner Lean, MD;  Location: [facility] ENDOSCOPY;  Service: Cardiovascular;  Laterality: N/A;    MEDICATIONS: . amoxicillin (AMOXIL) 875 MG tablet  . aspirin 81 MG EC tablet  . aspirin-acetaminophen-caffeine (EXCEDRIN MIGRAINE) 250-250-65 MG tablet  . azelastine (ASTELIN) 0.1 % nasal spray  . diltiazem (CARDIZEM CD) 240 MG 24 hr capsule  .  dofetilide (TIKOSYN) 500 MCG capsule  . fluticasone (FLONASE) 50 MCG/ACT nasal spray  . ibuprofen (ADVIL) 200 MG tablet  . losartan (COZAAR) 50 MG tablet  . montelukast (SINGULAIR) 10 MG tablet  . rosuvastatin (CRESTOR) 40 MG tablet  . tamsulosin (FLOMAX) 0.4 MG CAPS capsule  . traMADol (ULTRAM) 50 MG tablet  . XARELTO 20 MG TABS tablet   No current facility-administered medications for this encounter.    Myra Gianotti, PA-C Surgical Short Stay/Anesthesiology [facility] Phone [phone] [facility] Phone [phone] 03/28/2020 12:33 PM

## 2020-03-28 NOTE — Anesthesia Preprocedure Evaluation (Addendum)
Anesthesia Evaluation  Patient identified by MRN, date of birth, ID band Patient awake    Reviewed: Patient's Chart, lab work & pertinent test results  Airway Mallampati: II  TM Distance: >3 FB Neck ROM: Full    Dental  (+) Teeth Intact   Pulmonary sleep apnea and Continuous Positive Airway Pressure Ventilation ,    Pulmonary exam normal        Cardiovascular hypertension, Pt. on medications + CAD and + Cardiac Stents  + dysrhythmias Atrial Fibrillation  Rhythm:Irregular Rate:Normal  S/p multiple ablations with recurrence (most recent 03/18). DES LAD 2019   Neuro/Psych negative neurological ROS  negative psych ROS   GI/Hepatic negative GI ROS, Neg liver ROS,   Endo/Other  negative endocrine ROS  Renal/GU Renal diseaseRenal stones  negative genitourinary   Musculoskeletal  (+) Arthritis , Osteoarthritis,    Abdominal (+)  Abdomen: soft. Bowel sounds: normal.  Peds  Hematology negative hematology ROS (+)   Anesthesia Other Findings   Reproductive/Obstetrics                           Anesthesia Physical Anesthesia Plan  ASA: III  Anesthesia Plan: General   Post-op Pain Management:    Induction: Intravenous  PONV Risk Score and Plan: 2 and Ondansetron, Dexamethasone, Midazolam and Treatment may vary due to age or medical condition  Airway Management Planned: Mask, Double Lumen EBT and Oral ETT  Additional Equipment: Arterial line, CVP, TEE, 3D TEE and Ultrasound Guidance Line Placement  Intra-op Plan:   Post-operative Plan: Extubation in OR  Informed Consent: I have reviewed the patients History and Physical, chart, labs and discussed the procedure including the risks, benefits and alternatives for the proposed anesthesia with the patient or authorized representative who has indicated his/her understanding and acceptance.     Dental advisory given  Plan Discussed with:  CRNA  Anesthesia Plan Comments: (PAT note written 03/28/2020 by Myra Gianotti, PA-C.  Cardiac cath 02/02/20:  Colon Flattery 1st Diag lesion is 20% stenosed.  Previously placed Prox LAD drug eluting stent is widely patent.  Balloon angioplasty was performed.  Ost 2nd Mrg lesion is 30% stenosed.  Prox Cx lesion is 30% stenosed.  Mid Cx lesion is 50% stenosed.  Mid RCA lesion is 30% stenosed.  RPAV lesion is 40% stenosed. 1. Patent mid LAD stent with no restenosis 2. Mild to moderate mid Circumflex stenosis, unchanged from last cath 3. Mild to moderate right posterolateral artery stenosis - Recommendations: Continue medical management of CAD. No further ischemic workup. Proceed with planned surgical procedure.  TEE 02/02/20: IMPRESSIONS  1. Left ventricular ejection fraction, by estimation, is 55 to 60%. The  left ventricle has normal function. The left ventricle has no regional  wall motion abnormalities. Left ventricular diastolic function could not  be evaluated.  2. Right ventricular systolic function is normal. The right ventricular  size is normal. Likely normal right ventricular thickness.  3. Left atrial size was dilated. No left atrial/left atrial appendage  thrombus was detected.  4. The mitral valve is normal in structure. Mild mitral valve  regurgitation.  5. The aortic valve is tricuspid. Aortic valve regurgitation is mild.  - Conclusion(s)/Recommendation(s): Normal biventricular function without  evidence of hemodynamically significant valvular heart disease. )      Anesthesia Quick Evaluation

## 2020-03-29 MED ORDER — SODIUM CHLORIDE 0.9 % IV SOLN
750.0000 mg | INTRAVENOUS | Status: AC
  Administered 2020-03-30: 750 mg via INTRAVENOUS
  Filled 2020-03-29: qty 750

## 2020-03-29 MED ORDER — INSULIN REGULAR(HUMAN) IN NACL 100-0.9 UT/100ML-% IV SOLN
INTRAVENOUS | Status: DC
  Filled 2020-03-29: qty 100

## 2020-03-29 MED ORDER — EPINEPHRINE HCL 5 MG/250ML IV SOLN IN NS
0.0000 ug/min | INTRAVENOUS | Status: DC
  Filled 2020-03-29: qty 250

## 2020-03-29 MED ORDER — DEXMEDETOMIDINE HCL IN NACL 400 MCG/100ML IV SOLN
0.1000 ug/kg/h | INTRAVENOUS | Status: DC
  Filled 2020-03-29: qty 100

## 2020-03-29 MED ORDER — VANCOMYCIN HCL 1000 MG IV SOLR
INTRAVENOUS | Status: DC
  Filled 2020-03-29: qty 1000

## 2020-03-29 MED ORDER — VANCOMYCIN HCL 1500 MG/300ML IV SOLN
1500.0000 mg | INTRAVENOUS | Status: DC
  Filled 2020-03-29: qty 300

## 2020-03-29 MED ORDER — TRANEXAMIC ACID (OHS) PUMP PRIME SOLUTION
2.0000 mg/kg | INTRAVENOUS | Status: DC
  Filled 2020-03-29: qty 1.79

## 2020-03-29 MED ORDER — MANNITOL 20 % IV SOLN
Freq: Once | INTRAVENOUS | Status: DC
  Filled 2020-03-29: qty 13

## 2020-03-29 MED ORDER — TRANEXAMIC ACID (OHS) BOLUS VIA INFUSION
15.0000 mg/kg | INTRAVENOUS | Status: AC
  Administered 2020-03-30: 1345.5 mg via INTRAVENOUS
  Filled 2020-03-29: qty 1346

## 2020-03-29 MED ORDER — SODIUM CHLORIDE 0.9 % IV SOLN
INTRAVENOUS | Status: DC
  Filled 2020-03-29: qty 30

## 2020-03-29 MED ORDER — GLUTARALDEHYDE 0.625% SOAKING SOLUTION
TOPICAL | Status: DC
  Filled 2020-03-29: qty 50

## 2020-03-29 MED ORDER — PLASMA-LYTE 148 IV SOLN
INTRAVENOUS | Status: DC
  Filled 2020-03-29: qty 2.5

## 2020-03-29 MED ORDER — VANCOMYCIN HCL 1250 MG/250ML IV SOLN
1250.0000 mg | INTRAVENOUS | Status: DC
  Filled 2020-03-29: qty 250

## 2020-03-29 MED ORDER — SODIUM CHLORIDE 0.9 % IV SOLN
1.5000 g | INTRAVENOUS | Status: DC
  Filled 2020-03-29: qty 1.5

## 2020-03-29 MED ORDER — PHENYLEPHRINE HCL-NACL 20-0.9 MG/250ML-% IV SOLN
30.0000 ug/min | INTRAVENOUS | Status: DC
  Filled 2020-03-29: qty 250

## 2020-03-29 MED ORDER — NITROGLYCERIN IN D5W 200-5 MCG/ML-% IV SOLN
2.0000 ug/min | INTRAVENOUS | Status: DC
  Filled 2020-03-29: qty 250

## 2020-03-29 MED ORDER — POTASSIUM CHLORIDE 2 MEQ/ML IV SOLN
80.0000 meq | INTRAVENOUS | Status: DC
  Filled 2020-03-29: qty 40

## 2020-03-29 MED ORDER — NOREPINEPHRINE 4 MG/250ML-% IV SOLN
0.0000 ug/min | INTRAVENOUS | Status: DC
  Filled 2020-03-29: qty 250

## 2020-03-29 MED ORDER — TRANEXAMIC ACID 1000 MG/10ML IV SOLN
1.5000 mg/kg/h | INTRAVENOUS | Status: AC
  Administered 2020-03-30: 1.5 mg/kg/h via INTRAVENOUS
  Filled 2020-03-29: qty 25

## 2020-03-29 MED ORDER — MILRINONE LACTATE IN DEXTROSE 20-5 MG/100ML-% IV SOLN
0.3000 ug/kg/min | INTRAVENOUS | Status: DC
  Filled 2020-03-29: qty 100

## 2020-03-30 ENCOUNTER — Inpatient Hospital Stay (HOSPITAL_COMMUNITY): Payer: BC Managed Care – PPO | Admitting: Certified Registered Nurse Anesthetist

## 2020-03-30 ENCOUNTER — Inpatient Hospital Stay (HOSPITAL_COMMUNITY): Payer: BC Managed Care – PPO

## 2020-03-30 ENCOUNTER — Encounter (HOSPITAL_COMMUNITY): Payer: Self-pay | Admitting: Thoracic Surgery (Cardiothoracic Vascular Surgery)

## 2020-03-30 ENCOUNTER — Encounter (HOSPITAL_COMMUNITY)
Admission: RE | Disposition: A | Discharge status: Home / Self Care | Payer: Self-pay | Source: Home / Self Care | Attending: Thoracic Surgery (Cardiothoracic Vascular Surgery)

## 2020-03-30 ENCOUNTER — Other Ambulatory Visit: Payer: Self-pay

## 2020-03-30 ENCOUNTER — Inpatient Hospital Stay (HOSPITAL_COMMUNITY)
Admission: RE | Admit: 2020-03-30 | Discharge: 2020-04-04 | DRG: 229 | Disposition: A | Discharge status: Home / Self Care | Payer: BC Managed Care – PPO | Attending: Thoracic Surgery (Cardiothoracic Vascular Surgery) | Admitting: Thoracic Surgery (Cardiothoracic Vascular Surgery)

## 2020-03-30 DIAGNOSIS — Z8679 Personal history of other diseases of the circulatory system: Secondary | ICD-10-CM

## 2020-03-30 DIAGNOSIS — Z20822 Contact with and (suspected) exposure to covid-19: Secondary | ICD-10-CM | POA: Diagnosis present

## 2020-03-30 DIAGNOSIS — G4733 Obstructive sleep apnea (adult) (pediatric): Secondary | ICD-10-CM | POA: Diagnosis present

## 2020-03-30 DIAGNOSIS — I2510 Atherosclerotic heart disease of native coronary artery without angina pectoris: Secondary | ICD-10-CM | POA: Diagnosis present

## 2020-03-30 DIAGNOSIS — Z885 Allergy status to narcotic agent status: Secondary | ICD-10-CM | POA: Diagnosis not present

## 2020-03-30 DIAGNOSIS — Z955 Presence of coronary angioplasty implant and graft: Secondary | ICD-10-CM | POA: Diagnosis not present

## 2020-03-30 DIAGNOSIS — Z7901 Long term (current) use of anticoagulants: Secondary | ICD-10-CM

## 2020-03-30 DIAGNOSIS — I480 Paroxysmal atrial fibrillation: Secondary | ICD-10-CM | POA: Diagnosis present

## 2020-03-30 DIAGNOSIS — M1990 Unspecified osteoarthritis, unspecified site: Secondary | ICD-10-CM | POA: Diagnosis present

## 2020-03-30 DIAGNOSIS — I10 Essential (primary) hypertension: Secondary | ICD-10-CM | POA: Diagnosis present

## 2020-03-30 DIAGNOSIS — E785 Hyperlipidemia, unspecified: Secondary | ICD-10-CM | POA: Diagnosis present

## 2020-03-30 DIAGNOSIS — I4891 Unspecified atrial fibrillation: Secondary | ICD-10-CM | POA: Diagnosis present

## 2020-03-30 DIAGNOSIS — J939 Pneumothorax, unspecified: Secondary | ICD-10-CM

## 2020-03-30 DIAGNOSIS — J9811 Atelectasis: Secondary | ICD-10-CM

## 2020-03-30 DIAGNOSIS — Z9889 Other specified postprocedural states: Secondary | ICD-10-CM

## 2020-03-30 HISTORY — PX: TEE WITHOUT CARDIOVERSION: SHX5443

## 2020-03-30 HISTORY — PX: CLIPPING OF ATRIAL APPENDAGE: SHX5773

## 2020-03-30 HISTORY — DX: Personal history of other diseases of the circulatory system: Z86.79

## 2020-03-30 HISTORY — PX: MINIMALLY INVASIVE MAZE PROCEDURE: SHX6244

## 2020-03-30 HISTORY — DX: Personal history of other diseases of the circulatory system: Z98.890

## 2020-03-30 LAB — BASIC METABOLIC PANEL
Anion gap: 9 (ref 5–15)
BUN: 13 mg/dL (ref 6–20)
CO2: 21 mmol/L — ABNORMAL LOW (ref 22–32)
Calcium: 7.4 mg/dL — ABNORMAL LOW (ref 8.9–10.3)
Chloride: 108 mmol/L (ref 98–111)
Creatinine, Ser: 0.71 mg/dL (ref 0.61–1.24)
GFR, Estimated: >60 mL/min (ref >60)
Glucose, Bld: 110 mg/dL — ABNORMAL HIGH (ref 70–99)
Potassium: 4.2 mmol/L (ref 3.5–5.1)
Sodium: 138 mmol/L (ref 135–145)

## 2020-03-30 LAB — POCT I-STAT, CHEM 8
BUN: 14 mg/dL (ref 6–20)
BUN: 12 mg/dL (ref 6–20)
BUN: 11 mg/dL (ref 6–20)
BUN: 12 mg/dL (ref 6–20)
Calcium, Ion: 1.24 mmol/L (ref 1.15–1.40)
Calcium, Ion: 1.22 mmol/L (ref 1.15–1.40)
Calcium, Ion: 1.1 mmol/L — ABNORMAL LOW (ref 1.15–1.40)
Calcium, Ion: 1.13 mmol/L — ABNORMAL LOW (ref 1.15–1.40)
Chloride: 103 mmol/L (ref 98–111)
Chloride: 103 mmol/L (ref 98–111)
Chloride: 103 mmol/L (ref 98–111)
Chloride: 104 mmol/L (ref 98–111)
Creatinine, Ser: 0.5 mg/dL — ABNORMAL LOW (ref 0.61–1.24)
Creatinine, Ser: 0.5 mg/dL — ABNORMAL LOW (ref 0.61–1.24)
Creatinine, Ser: 0.5 mg/dL — ABNORMAL LOW (ref 0.61–1.24)
Creatinine, Ser: 0.5 mg/dL — ABNORMAL LOW (ref 0.61–1.24)
Glucose, Bld: 114 mg/dL — ABNORMAL HIGH (ref 70–99)
Glucose, Bld: 112 mg/dL — ABNORMAL HIGH (ref 70–99)
Glucose, Bld: 140 mg/dL — ABNORMAL HIGH (ref 70–99)
Glucose, Bld: 148 mg/dL — ABNORMAL HIGH (ref 70–99)
HCT: 34 % — ABNORMAL LOW (ref 39.0–52.0)
HCT: 30 % — ABNORMAL LOW (ref 39.0–52.0)
HCT: 26 % — ABNORMAL LOW (ref 39.0–52.0)
HCT: 30 % — ABNORMAL LOW (ref 39.0–52.0)
Hemoglobin: 11.6 g/dL — ABNORMAL LOW (ref 13.0–17.0)
Hemoglobin: 10.2 g/dL — ABNORMAL LOW (ref 13.0–17.0)
Hemoglobin: 8.8 g/dL — ABNORMAL LOW (ref 13.0–17.0)
Hemoglobin: 10.2 g/dL — ABNORMAL LOW (ref 13.0–17.0)
Potassium: 3.6 mmol/L (ref 3.5–5.1)
Potassium: 3.8 mmol/L (ref 3.5–5.1)
Potassium: 4.7 mmol/L (ref 3.5–5.1)
Potassium: 3.8 mmol/L (ref 3.5–5.1)
Sodium: 141 mmol/L (ref 135–145)
Sodium: 140 mmol/L (ref 135–145)
Sodium: 139 mmol/L (ref 135–145)
Sodium: 141 mmol/L (ref 135–145)
TCO2: 25 mmol/L (ref 22–32)
TCO2: 24 mmol/L (ref 22–32)
TCO2: 25 mmol/L (ref 22–32)
TCO2: 23 mmol/L (ref 22–32)

## 2020-03-30 LAB — POCT I-STAT 7, (LYTES, BLD GAS, ICA,H+H)
Acid-Base Excess: 0 mmol/L (ref 0.0–2.0)
Acid-base deficit: 2 mmol/L (ref 0.0–2.0)
Acid-base deficit: 2 mmol/L (ref 0.0–2.0)
Acid-base deficit: 4 mmol/L — ABNORMAL HIGH (ref 0.0–2.0)
Acid-base deficit: 6 mmol/L — ABNORMAL HIGH (ref 0.0–2.0)
Bicarbonate: 27.3 mmol/L (ref 20.0–28.0)
Bicarbonate: 24.7 mmol/L (ref 20.0–28.0)
Bicarbonate: 23.6 mmol/L (ref 20.0–28.0)
Bicarbonate: 24.2 mmol/L (ref 20.0–28.0)
Bicarbonate: 19.3 mmol/L — ABNORMAL LOW (ref 20.0–28.0)
Calcium, Ion: 1.07 mmol/L — ABNORMAL LOW (ref 1.15–1.40)
Calcium, Ion: 1.12 mmol/L — ABNORMAL LOW (ref 1.15–1.40)
Calcium, Ion: 1.05 mmol/L — ABNORMAL LOW (ref 1.15–1.40)
Calcium, Ion: 1.14 mmol/L — ABNORMAL LOW (ref 1.15–1.40)
Calcium, Ion: 1.05 mmol/L — ABNORMAL LOW (ref 1.15–1.40)
HCT: 26 % — ABNORMAL LOW (ref 39.0–52.0)
HCT: 28 % — ABNORMAL LOW (ref 39.0–52.0)
HCT: 26 % — ABNORMAL LOW (ref 39.0–52.0)
HCT: 29 % — ABNORMAL LOW (ref 39.0–52.0)
HCT: 27 % — ABNORMAL LOW (ref 39.0–52.0)
Hemoglobin: 8.8 g/dL — ABNORMAL LOW (ref 13.0–17.0)
Hemoglobin: 9.5 g/dL — ABNORMAL LOW (ref 13.0–17.0)
Hemoglobin: 8.8 g/dL — ABNORMAL LOW (ref 13.0–17.0)
Hemoglobin: 9.9 g/dL — ABNORMAL LOW (ref 13.0–17.0)
Hemoglobin: 9.2 g/dL — ABNORMAL LOW (ref 13.0–17.0)
O2 Saturation: 100 %
O2 Saturation: 93 %
O2 Saturation: 92 %
O2 Saturation: 99 %
O2 Saturation: 90 %
Patient temperature: 36.6
Patient temperature: 36.7
Patient temperature: 37.2
Potassium: 4.1 mmol/L (ref 3.5–5.1)
Potassium: 3.8 mmol/L (ref 3.5–5.1)
Potassium: 3.6 mmol/L (ref 3.5–5.1)
Potassium: 3.8 mmol/L (ref 3.5–5.1)
Potassium: 3.9 mmol/L (ref 3.5–5.1)
Sodium: 140 mmol/L (ref 135–145)
Sodium: 142 mmol/L (ref 135–145)
Sodium: 142 mmol/L (ref 135–145)
Sodium: 143 mmol/L (ref 135–145)
Sodium: 141 mmol/L (ref 135–145)
TCO2: 29 mmol/L (ref 22–32)
TCO2: 26 mmol/L (ref 22–32)
TCO2: 25 mmol/L (ref 22–32)
TCO2: 26 mmol/L (ref 22–32)
TCO2: 20 mmol/L — ABNORMAL LOW (ref 22–32)
pCO2 arterial: 56.2 mmHg — ABNORMAL HIGH (ref 32.0–48.0)
pCO2 arterial: 53 mmHg — ABNORMAL HIGH (ref 32.0–48.0)
pCO2 arterial: 45.9 mmHg (ref 32.0–48.0)
pCO2 arterial: 51.5 mmHg — ABNORMAL HIGH (ref 32.0–48.0)
pCO2 arterial: 34.4 mmHg (ref 32.0–48.0)
pH, Arterial: 7.294 — ABNORMAL LOW (ref 7.350–7.450)
pH, Arterial: 7.276 — ABNORMAL LOW (ref 7.350–7.450)
pH, Arterial: 7.267 — ABNORMAL LOW (ref 7.350–7.450)
pH, Arterial: 7.328 — ABNORMAL LOW (ref 7.350–7.450)
pH, Arterial: 7.358 (ref 7.350–7.450)
pO2, Arterial: 377 mmHg — ABNORMAL HIGH (ref 83.0–108.0)
pO2, Arterial: 77 mmHg — ABNORMAL LOW (ref 83.0–108.0)
pO2, Arterial: 139 mmHg — ABNORMAL HIGH (ref 83.0–108.0)
pO2, Arterial: 71 mmHg — ABNORMAL LOW (ref 83.0–108.0)
pO2, Arterial: 61 mmHg — ABNORMAL LOW (ref 83.0–108.0)

## 2020-03-30 LAB — CBC
HCT: 31.8 % — ABNORMAL LOW (ref 39.0–52.0)
HCT: 30 % — ABNORMAL LOW (ref 39.0–52.0)
Hemoglobin: 11.2 g/dL — ABNORMAL LOW (ref 13.0–17.0)
Hemoglobin: 10.9 g/dL — ABNORMAL LOW (ref 13.0–17.0)
MCH: 30.9 pg (ref 26.0–34.0)
MCH: 31.7 pg (ref 26.0–34.0)
MCHC: 35.2 g/dL (ref 30.0–36.0)
MCHC: 36.3 g/dL — ABNORMAL HIGH (ref 30.0–36.0)
MCV: 87.6 fL (ref 80.0–100.0)
MCV: 87.2 fL (ref 80.0–100.0)
Platelets: 216 10*3/uL (ref 150–400)
Platelets: 212 10*3/uL (ref 150–400)
RBC: 3.63 MIL/uL — ABNORMAL LOW (ref 4.22–5.81)
RBC: 3.44 MIL/uL — ABNORMAL LOW (ref 4.22–5.81)
RDW: 12.1 % (ref 11.5–15.5)
RDW: 12.3 % (ref 11.5–15.5)
WBC: 23.2 10*3/uL — ABNORMAL HIGH (ref 4.0–10.5)
WBC: 18.4 10*3/uL — ABNORMAL HIGH (ref 4.0–10.5)
nRBC: 0 % (ref 0.0–0.2)
nRBC: 0 % (ref 0.0–0.2)

## 2020-03-30 LAB — APTT: aPTT: 30 seconds (ref 24–36)

## 2020-03-30 LAB — POCT I-STAT EG7
Acid-base deficit: 1 mmol/L (ref 0.0–2.0)
Bicarbonate: 24.7 mmol/L (ref 20.0–28.0)
Calcium, Ion: 1.09 mmol/L — ABNORMAL LOW (ref 1.15–1.40)
HCT: 26 % — ABNORMAL LOW (ref 39.0–52.0)
Hemoglobin: 8.8 g/dL — ABNORMAL LOW (ref 13.0–17.0)
O2 Saturation: 75 %
Potassium: 4.9 mmol/L (ref 3.5–5.1)
Sodium: 139 mmol/L (ref 135–145)
TCO2: 26 mmol/L (ref 22–32)
pCO2, Ven: 48.5 mmHg (ref 44.0–60.0)
pH, Ven: 7.315 (ref 7.250–7.430)
pO2, Ven: 44 mmHg (ref 32.0–45.0)

## 2020-03-30 LAB — PROTIME-INR
INR: 1.4 — ABNORMAL HIGH (ref 0.8–1.2)
Prothrombin Time: 16.7 seconds — ABNORMAL HIGH (ref 11.4–15.2)

## 2020-03-30 LAB — GLUCOSE, CAPILLARY
Glucose-Capillary: 106 mg/dL — ABNORMAL HIGH (ref 70–99)
Glucose-Capillary: 119 mg/dL — ABNORMAL HIGH (ref 70–99)
Glucose-Capillary: 118 mg/dL — ABNORMAL HIGH (ref 70–99)
Glucose-Capillary: 111 mg/dL — ABNORMAL HIGH (ref 70–99)
Glucose-Capillary: 113 mg/dL — ABNORMAL HIGH (ref 70–99)
Glucose-Capillary: 110 mg/dL — ABNORMAL HIGH (ref 70–99)
Glucose-Capillary: 125 mg/dL — ABNORMAL HIGH (ref 70–99)

## 2020-03-30 LAB — ECHO INTRAOPERATIVE TEE
AV Mean grad: 1 mmHg
Height: 68 in
MV Vena cont: 0.3 cm
Weight: 3164.04 oz

## 2020-03-30 LAB — HEMOGLOBIN AND HEMATOCRIT, BLOOD
HCT: 27.1 % — ABNORMAL LOW (ref 39.0–52.0)
Hemoglobin: 9.2 g/dL — ABNORMAL LOW (ref 13.0–17.0)

## 2020-03-30 LAB — PLATELET COUNT: Platelets: 194 10*3/uL (ref 150–400)

## 2020-03-30 LAB — ABO/RH: ABO/RH(D): O NEG

## 2020-03-30 LAB — MAGNESIUM: Magnesium: 2.8 mg/dL — ABNORMAL HIGH (ref 1.7–2.4)

## 2020-03-30 SURGERY — MAZE PROCEDURE, CARDIAC, MINIMALLY INVASIVE
Anesthesia: General | Site: Chest

## 2020-03-30 MED ORDER — DOFETILIDE 500 MCG PO CAPS
500.0000 ug | ORAL_CAPSULE | Freq: Two times a day (BID) | ORAL | Status: DC
  Administered 2020-03-31 – 2020-04-02 (×5): 500 ug via ORAL
  Filled 2020-03-30 (×7): qty 1

## 2020-03-30 MED ORDER — ALBUMIN HUMAN 5 % IV SOLN: INTRAVENOUS | Status: DC | PRN

## 2020-03-30 MED ORDER — MIDAZOLAM HCL (PF) 5 MG/ML IJ SOLN
INTRAMUSCULAR | Status: DC | PRN
  Administered 2020-03-30 (×2): 2 mg via INTRAVENOUS
  Administered 2020-03-30: 1 mg via INTRAVENOUS
  Administered 2020-03-30: 2 mg via INTRAVENOUS

## 2020-03-30 MED ORDER — HEPARIN SODIUM (PORCINE) 1000 UNIT/ML IJ SOLN
INTRAMUSCULAR | Status: DC | PRN
  Administered 2020-03-30: 33000 [IU] via INTRAVENOUS

## 2020-03-30 MED ORDER — LACTATED RINGERS IV SOLN: 500.0000 mL | Freq: Once | INTRAVENOUS | Status: DC | PRN

## 2020-03-30 MED ORDER — PHENYLEPHRINE HCL-NACL 20-0.9 MG/250ML-% IV SOLN
0.0000 ug/min | INTRAVENOUS | Status: DC
  Administered 2020-03-31: 10 ug/min via INTRAVENOUS
  Filled 2020-03-30: qty 250

## 2020-03-30 MED ORDER — ACETAMINOPHEN 10 MG/ML IV SOLN: 1000.0000 mg | Freq: Once | INTRAVENOUS | Status: DC | PRN

## 2020-03-30 MED ORDER — ACETAMINOPHEN 500 MG PO TABS
1000.0000 mg | ORAL_TABLET | Freq: Four times a day (QID) | ORAL | Status: DC
  Administered 2020-03-31 – 2020-04-04 (×16): 1000 mg via ORAL
  Filled 2020-03-30 (×17): qty 2

## 2020-03-30 MED ORDER — SODIUM CHLORIDE 0.9 % IV SOLN
1.5000 g | Freq: Two times a day (BID) | INTRAVENOUS | Status: DC
  Administered 2020-03-30 – 2020-03-31 (×3): 1.5 g via INTRAVENOUS
  Filled 2020-03-30 (×3): qty 1.5

## 2020-03-30 MED ORDER — INSULIN REGULAR(HUMAN) IN NACL 100-0.9 UT/100ML-% IV SOLN: INTRAVENOUS | Status: DC

## 2020-03-30 MED ORDER — SODIUM CHLORIDE 0.9% FLUSH: 3.0000 mL | Freq: Two times a day (BID) | INTRAVENOUS | Status: DC

## 2020-03-30 MED ORDER — FENTANYL CITRATE (PF) 250 MCG/5ML IJ SOLN
INTRAMUSCULAR | Status: AC
  Filled 2020-03-30: qty 25

## 2020-03-30 MED ORDER — NITROGLYCERIN IN D5W 200-5 MCG/ML-% IV SOLN: 0.0000 ug/min | INTRAVENOUS | Status: DC

## 2020-03-30 MED ORDER — CHLORHEXIDINE GLUCONATE 0.12 % MT SOLN: 15.0000 mL | Freq: Once | OROMUCOSAL | Status: DC

## 2020-03-30 MED ORDER — PHENYLEPHRINE 40 MCG/ML (10ML) SYRINGE FOR IV PUSH (FOR BLOOD PRESSURE SUPPORT)
PREFILLED_SYRINGE | INTRAVENOUS | Status: DC | PRN
  Administered 2020-03-30 (×2): 80 ug via INTRAVENOUS

## 2020-03-30 MED ORDER — SODIUM CHLORIDE 0.9% FLUSH
10.0000 mL | Freq: Two times a day (BID) | INTRAVENOUS | Status: DC
  Administered 2020-03-30 – 2020-04-02 (×4): 10 mL

## 2020-03-30 MED ORDER — CHLORHEXIDINE GLUCONATE 0.12 % MT SOLN
15.0000 mL | OROMUCOSAL | Status: AC
  Administered 2020-03-30: 15 mL via OROMUCOSAL

## 2020-03-30 MED ORDER — ASPIRIN EC 325 MG PO TBEC: 325.0000 mg | DELAYED_RELEASE_TABLET | Freq: Every day | ORAL | Status: DC

## 2020-03-30 MED ORDER — SODIUM CHLORIDE 0.45 % IV SOLN: INTRAVENOUS | Status: DC | PRN

## 2020-03-30 MED ORDER — ARTIFICIAL TEARS OPHTHALMIC OINT
TOPICAL_OINTMENT | OPHTHALMIC | Status: DC | PRN
  Administered 2020-03-30: 1 via OPHTHALMIC

## 2020-03-30 MED ORDER — MORPHINE SULFATE (PF) 2 MG/ML IV SOLN
1.0000 mg | INTRAVENOUS | Status: DC | PRN
  Administered 2020-03-31: 2 mg via INTRAVENOUS
  Administered 2020-03-31: 4 mg via INTRAVENOUS
  Administered 2020-03-31: 2 mg via INTRAVENOUS
  Administered 2020-03-31 – 2020-04-01 (×3): 4 mg via INTRAVENOUS
  Filled 2020-03-30: qty 2
  Filled 2020-03-30: qty 1
  Filled 2020-03-30: qty 2
  Filled 2020-03-30: qty 1
  Filled 2020-03-30 (×2): qty 2

## 2020-03-30 MED ORDER — MAGNESIUM SULFATE 4 GM/100ML IV SOLN
4.0000 g | Freq: Once | INTRAVENOUS | Status: AC
  Administered 2020-03-30: 4 g via INTRAVENOUS
  Filled 2020-03-30: qty 100

## 2020-03-30 MED ORDER — DEXTROSE 50 % IV SOLN: 0.0000 mL | INTRAVENOUS | Status: DC | PRN

## 2020-03-30 MED ORDER — VANCOMYCIN HCL 1000 MG IV SOLR
INTRAVENOUS | Status: DC | PRN
  Administered 2020-03-30: 1000 mL

## 2020-03-30 MED ORDER — PLASMA-LYTE 148 IV SOLN
INTRAVENOUS | Status: DC | PRN
  Administered 2020-03-30: 500 mL via INTRAVASCULAR

## 2020-03-30 MED ORDER — TRAMADOL HCL 50 MG PO TABS
50.0000 mg | ORAL_TABLET | ORAL | Status: DC | PRN
  Administered 2020-03-31 – 2020-04-01 (×4): 100 mg via ORAL
  Filled 2020-03-30 (×4): qty 2

## 2020-03-30 MED ORDER — EPHEDRINE SULFATE-NACL 50-0.9 MG/10ML-% IV SOSY
PREFILLED_SYRINGE | INTRAVENOUS | Status: DC | PRN
  Administered 2020-03-30: 5 mg via INTRAVENOUS

## 2020-03-30 MED ORDER — CHLORHEXIDINE GLUCONATE 4 % EX LIQD: 30.0000 mL | CUTANEOUS | Status: DC

## 2020-03-30 MED ORDER — BISACODYL 10 MG RE SUPP: 10.0000 mg | Freq: Every day | RECTAL | Status: DC

## 2020-03-30 MED ORDER — DEXMEDETOMIDINE HCL IN NACL 400 MCG/100ML IV SOLN
0.0000 ug/kg/h | INTRAVENOUS | Status: DC
  Administered 2020-03-30: 0.3 ug/kg/h via INTRAVENOUS
  Filled 2020-03-30 (×2): qty 100

## 2020-03-30 MED ORDER — LACTATED RINGERS IV SOLN: INTRAVENOUS | Status: DC | PRN

## 2020-03-30 MED ORDER — TAMSULOSIN HCL 0.4 MG PO CAPS
0.4000 mg | ORAL_CAPSULE | Freq: Every day | ORAL | Status: DC
  Administered 2020-03-31 – 2020-04-03 (×4): 0.4 mg via ORAL
  Filled 2020-03-30 (×4): qty 1

## 2020-03-30 MED ORDER — FENTANYL CITRATE (PF) 100 MCG/2ML IJ SOLN: 25.0000 ug | INTRAMUSCULAR | Status: DC | PRN

## 2020-03-30 MED ORDER — METOPROLOL TARTRATE 12.5 MG HALF TABLET
ORAL_TABLET | ORAL | Status: AC
  Administered 2020-03-30: 12.5 mg via ORAL
  Filled 2020-03-30: qty 1

## 2020-03-30 MED ORDER — BUPIVACAINE HCL (PF) 0.5 % IJ SOLN
INTRAMUSCULAR | Status: AC
  Filled 2020-03-30: qty 30

## 2020-03-30 MED ORDER — DEXMEDETOMIDINE HCL IN NACL 400 MCG/100ML IV SOLN
INTRAVENOUS | Status: DC | PRN
  Administered 2020-03-30: .3 ug/kg/h via INTRAVENOUS

## 2020-03-30 MED ORDER — ACETAMINOPHEN 160 MG/5ML PO SOLN: 650.0000 mg | Freq: Once | ORAL | Status: AC

## 2020-03-30 MED ORDER — BUPIVACAINE LIPOSOME 1.3 % IJ SUSP
20.0000 mL | INTRAMUSCULAR | Status: DC
  Filled 2020-03-30: qty 20

## 2020-03-30 MED ORDER — BUPIVACAINE LIPOSOME 1.3 % IJ SUSP
INTRAMUSCULAR | Status: DC | PRN
  Administered 2020-03-30: 50 mL

## 2020-03-30 MED ORDER — PROTAMINE SULFATE 10 MG/ML IV SOLN
INTRAVENOUS | Status: DC | PRN
  Administered 2020-03-30: 330 mg via INTRAVENOUS

## 2020-03-30 MED ORDER — LACTATED RINGERS IV SOLN: INTRAVENOUS | Status: DC

## 2020-03-30 MED ORDER — INSULIN REGULAR(HUMAN) IN NACL 100-0.9 UT/100ML-% IV SOLN
INTRAVENOUS | Status: DC | PRN
  Administered 2020-03-30: 1.1 [IU]/h via INTRAVENOUS

## 2020-03-30 MED ORDER — ACETAMINOPHEN 160 MG/5ML PO SOLN: 1000.0000 mg | Freq: Four times a day (QID) | ORAL | Status: DC

## 2020-03-30 MED ORDER — SODIUM CHLORIDE 0.9% FLUSH: 10.0000 mL | INTRAVENOUS | Status: DC | PRN

## 2020-03-30 MED ORDER — CHLORHEXIDINE GLUCONATE CLOTH 2 % EX PADS
6.0000 | MEDICATED_PAD | Freq: Every day | CUTANEOUS | Status: DC
  Administered 2020-03-30 – 2020-04-01 (×3): 6 via TOPICAL

## 2020-03-30 MED ORDER — DOCUSATE SODIUM 100 MG PO CAPS
200.0000 mg | ORAL_CAPSULE | Freq: Every day | ORAL | Status: DC
  Administered 2020-03-31 – 2020-04-04 (×5): 200 mg via ORAL
  Filled 2020-03-30 (×5): qty 2

## 2020-03-30 MED ORDER — PROPOFOL 10 MG/ML IV BOLUS
INTRAVENOUS | Status: DC | PRN
  Administered 2020-03-30: 170 mg via INTRAVENOUS

## 2020-03-30 MED ORDER — VANCOMYCIN HCL IN DEXTROSE 1-5 GM/200ML-% IV SOLN
1000.0000 mg | Freq: Once | INTRAVENOUS | Status: AC
  Administered 2020-03-30: 1000 mg via INTRAVENOUS
  Filled 2020-03-30: qty 200

## 2020-03-30 MED ORDER — MIDAZOLAM HCL 2 MG/2ML IJ SOLN: 2.0000 mg | INTRAMUSCULAR | Status: DC | PRN

## 2020-03-30 MED ORDER — SODIUM CHLORIDE 0.9 % IV SOLN: INTRAVENOUS | Status: DC

## 2020-03-30 MED ORDER — PROPOFOL 10 MG/ML IV BOLUS
INTRAVENOUS | Status: AC
  Filled 2020-03-30: qty 20

## 2020-03-30 MED ORDER — ALBUMIN HUMAN 5 % IV SOLN: 250.0000 mL | INTRAVENOUS | Status: AC | PRN

## 2020-03-30 MED ORDER — ROCURONIUM BROMIDE 10 MG/ML (PF) SYRINGE
PREFILLED_SYRINGE | INTRAVENOUS | Status: DC | PRN
  Administered 2020-03-30: 100 mg via INTRAVENOUS
  Administered 2020-03-30: 50 mg via INTRAVENOUS
  Administered 2020-03-30: 40 mg via INTRAVENOUS
  Administered 2020-03-30: 50 mg via INTRAVENOUS

## 2020-03-30 MED ORDER — BISACODYL 5 MG PO TBEC
10.0000 mg | DELAYED_RELEASE_TABLET | Freq: Every day | ORAL | Status: DC
  Administered 2020-03-31 – 2020-04-04 (×5): 10 mg via ORAL
  Filled 2020-03-30 (×5): qty 2

## 2020-03-30 MED ORDER — MIDAZOLAM HCL (PF) 10 MG/2ML IJ SOLN
INTRAMUSCULAR | Status: AC
  Filled 2020-03-30: qty 2

## 2020-03-30 MED ORDER — ASPIRIN 81 MG PO CHEW: 324.0000 mg | CHEWABLE_TABLET | Freq: Every day | ORAL | Status: DC

## 2020-03-30 MED ORDER — FAMOTIDINE IN NACL 20-0.9 MG/50ML-% IV SOLN
20.0000 mg | Freq: Two times a day (BID) | INTRAVENOUS | Status: AC
  Administered 2020-03-30 (×2): 20 mg via INTRAVENOUS
  Filled 2020-03-30 (×3): qty 50

## 2020-03-30 MED ORDER — SODIUM CHLORIDE 0.9% FLUSH: 3.0000 mL | INTRAVENOUS | Status: DC | PRN

## 2020-03-30 MED ORDER — SODIUM CHLORIDE 0.9 % IV SOLN: 250.0000 mL | INTRAVENOUS | Status: DC

## 2020-03-30 MED ORDER — PHENYLEPHRINE HCL-NACL 20-0.9 MG/250ML-% IV SOLN
INTRAVENOUS | Status: DC | PRN
  Administered 2020-03-30: 15 ug/min via INTRAVENOUS

## 2020-03-30 MED ORDER — SODIUM CHLORIDE 0.9 % IV SOLN
INTRAVENOUS | Status: DC | PRN
  Administered 2020-03-30: 1.5 g via INTRAVENOUS

## 2020-03-30 MED ORDER — SODIUM CHLORIDE 0.9 % IV SOLN: INTRAVENOUS | Status: AC

## 2020-03-30 MED ORDER — METOPROLOL TARTRATE 12.5 MG HALF TABLET: 12.5000 mg | ORAL_TABLET | Freq: Once | ORAL | Status: AC

## 2020-03-30 MED ORDER — ONDANSETRON HCL 4 MG/2ML IJ SOLN: 4.0000 mg | Freq: Once | INTRAMUSCULAR | Status: DC | PRN

## 2020-03-30 MED ORDER — 0.9 % SODIUM CHLORIDE (POUR BTL) OPTIME
TOPICAL | Status: DC | PRN
  Administered 2020-03-30: 5000 mL

## 2020-03-30 MED ORDER — KETOROLAC TROMETHAMINE 15 MG/ML IJ SOLN
15.0000 mg | Freq: Four times a day (QID) | INTRAMUSCULAR | Status: AC
  Administered 2020-03-30 – 2020-03-31 (×5): 15 mg via INTRAVENOUS
  Filled 2020-03-30 (×5): qty 1

## 2020-03-30 MED ORDER — METOPROLOL TARTRATE 5 MG/5ML IV SOLN
2.5000 mg | INTRAVENOUS | Status: DC | PRN
  Administered 2020-04-03: 5 mg via INTRAVENOUS
  Filled 2020-03-30: qty 5

## 2020-03-30 MED ORDER — CHLORHEXIDINE GLUCONATE 0.12 % MT SOLN
OROMUCOSAL | Status: AC
  Filled 2020-03-30: qty 15

## 2020-03-30 MED ORDER — ONDANSETRON HCL 4 MG/2ML IJ SOLN
4.0000 mg | Freq: Four times a day (QID) | INTRAMUSCULAR | Status: DC | PRN
  Administered 2020-03-31 – 2020-04-02 (×3): 4 mg via INTRAVENOUS
  Filled 2020-03-30 (×3): qty 2

## 2020-03-30 MED ORDER — ACETAMINOPHEN 650 MG RE SUPP
650.0000 mg | Freq: Once | RECTAL | Status: AC
  Administered 2020-03-30: 650 mg via RECTAL

## 2020-03-30 MED ORDER — POTASSIUM CHLORIDE 10 MEQ/50ML IV SOLN
10.0000 meq | INTRAVENOUS | Status: AC
  Administered 2020-03-30 (×3): 10 meq via INTRAVENOUS

## 2020-03-30 MED ORDER — FENTANYL CITRATE (PF) 100 MCG/2ML IJ SOLN
INTRAMUSCULAR | Status: DC | PRN
  Administered 2020-03-30: 100 ug via INTRAVENOUS
  Administered 2020-03-30 (×4): 50 ug via INTRAVENOUS

## 2020-03-30 MED ORDER — PANTOPRAZOLE SODIUM 40 MG PO TBEC
40.0000 mg | DELAYED_RELEASE_TABLET | Freq: Every day | ORAL | Status: DC
  Administered 2020-04-01 – 2020-04-03 (×3): 40 mg via ORAL
  Filled 2020-03-30 (×4): qty 1

## 2020-03-30 SURGICAL SUPPLY — 86 items
ADAPTER CARDIO PERF ANTE/RETRO (ADAPTER) ×3 IMPLANT
ARTICLIP LAA PROCLIP II 45 (Clip) ×3 IMPLANT
BAG DECANTER FOR FLEXI CONT (MISCELLANEOUS) ×3 IMPLANT
BLADE CLIPPER SURG (BLADE) ×3 IMPLANT
BLADE SURG 11 STRL SS (BLADE) ×3 IMPLANT
CANISTER SUCT 3000ML PPV (MISCELLANEOUS) ×6 IMPLANT
CANNULA ADULT BIO-MEDICUS 15FR (CANNULA) ×3 IMPLANT
CANNULA FEM VENOUS REMOTE 22FR (CANNULA) ×3 IMPLANT
CANNULA FEMORAL ART 14 SM (MISCELLANEOUS) ×3 IMPLANT
CANNULA GUNDRY RCSP 15FR (MISCELLANEOUS) ×3 IMPLANT
CANNULA OPTISITE PERFUSION 16F (CANNULA) IMPLANT
CANNULA OPTISITE PERFUSION 18F (CANNULA) ×3 IMPLANT
CANNULA SUMP PERICARDIAL (CANNULA) ×6 IMPLANT
CARDIOBLATE CARDIAC ABLATION (MISCELLANEOUS)
CATH CPB KIT OWEN (MISCELLANEOUS) IMPLANT
CELLS DAT CNTRL 66122 CELL SVR (MISCELLANEOUS) ×2 IMPLANT
CLAMP OLL ABLATION (MISCELLANEOUS) ×3 IMPLANT
CONN ST 1/4X3/8  BEN (MISCELLANEOUS) ×2
CONN ST 1/4X3/8 BEN (MISCELLANEOUS) ×4 IMPLANT
CONNECTOR 1/2X3/8X1/2 3 WAY (MISCELLANEOUS) ×1
CONNECTOR 1/2X3/8X1/2 3WAY (MISCELLANEOUS) ×2 IMPLANT
COVER BACK TABLE 24X17X13 BIG (DRAPES) ×3 IMPLANT
COVER PROBE W GEL 5X96 (DRAPES) ×3 IMPLANT
DERMABOND ADVANCED (GAUZE/BANDAGES/DRESSINGS) ×2
DERMABOND ADVANCED .7 DNX12 (GAUZE/BANDAGES/DRESSINGS) ×4 IMPLANT
DEVICE ATRICLIP LAA PRCLPII 45 (Clip) ×2 IMPLANT
DEVICE CARDIOBLATE CARDIAC ABL (MISCELLANEOUS) IMPLANT
DEVICE CLOSURE PERCLS PRGLD 6F (VASCULAR PRODUCTS) ×10 IMPLANT
DEVICE TROCAR PUNCTURE CLOSURE (ENDOMECHANICALS) ×3 IMPLANT
DRAIN CHANNEL 32F RND 10.7 FF (WOUND CARE) ×6 IMPLANT
DRAPE C-ARM 42X72 X-RAY (DRAPES) ×3 IMPLANT
DRAPE CV SPLIT W-CLR ANES SCRN (DRAPES) ×3 IMPLANT
DRAPE INCISE IOBAN 66X45 STRL (DRAPES) ×6 IMPLANT
DRAPE PERI GROIN 82X75IN TIB (DRAPES) ×3 IMPLANT
DRAPE SLUSH/WARMER DISC (DRAPES) ×3 IMPLANT
DRSG AQUACEL AG ADV 3.5X 4 (GAUZE/BANDAGES/DRESSINGS) ×3 IMPLANT
DRSG AQUACEL AG ADV 3.5X10 (GAUZE/BANDAGES/DRESSINGS) ×3 IMPLANT
ELECT BLADE 6.5 EXT (BLADE) ×3 IMPLANT
ELECT REM PT RETURN 9FT ADLT (ELECTROSURGICAL) ×6
ELECTRODE REM PT RTRN 9FT ADLT (ELECTROSURGICAL) ×4 IMPLANT
FEMORAL VENOUS CANN RAP (CANNULA) IMPLANT
GAUZE SPONGE 4X4 12PLY STRL (GAUZE/BANDAGES/DRESSINGS) ×3 IMPLANT
GAUZE SPONGE 4X4 12PLY STRL LF (GAUZE/BANDAGES/DRESSINGS) ×3 IMPLANT
GLOVE ORTHO TXT STRL SZ7.5 (GLOVE) ×9 IMPLANT
GOWN STRL REUS W/ TWL LRG LVL3 (GOWN DISPOSABLE) ×8 IMPLANT
GOWN STRL REUS W/TWL LRG LVL3 (GOWN DISPOSABLE) ×4
KIT BASIN OR (CUSTOM PROCEDURE TRAY) ×3 IMPLANT
KIT DILATOR VASC 18G NDL (KITS) ×6 IMPLANT
KIT SUCTION CATH 14FR (SUCTIONS) ×3 IMPLANT
KIT TURNOVER KIT B (KITS) ×3 IMPLANT
LEAD PACING MYOCARDI (MISCELLANEOUS) ×3 IMPLANT
NEEDLE AORTIC ROOT 14G 7F (CATHETERS) ×3 IMPLANT
NS IRRIG 1000ML POUR BTL (IV SOLUTION) ×15 IMPLANT
PACK E MIN INVASIVE VALVE (SUTURE) ×3 IMPLANT
PACK OPEN HEART (CUSTOM PROCEDURE TRAY) ×3 IMPLANT
PAD ARMBOARD 7.5X6 YLW CONV (MISCELLANEOUS) ×6 IMPLANT
PAD ELECT DEFIB RADIOL ZOLL (MISCELLANEOUS) ×3 IMPLANT
PERCLOSE PROGLIDE 6F (VASCULAR PRODUCTS) ×15
POSITIONER HEAD DONUT 9IN (MISCELLANEOUS) ×3 IMPLANT
PROBE CRYO2-ABLATION MALLABLE (MISCELLANEOUS) ×3 IMPLANT
RTRCTR WOUND ALEXIS 18CM MED (MISCELLANEOUS) ×3
SET CANNULATION TOURNIQUET (MISCELLANEOUS) ×3 IMPLANT
SET CARDIOPLEGIA MPS 5001102 (MISCELLANEOUS) ×3 IMPLANT
SET IRRIG TUBING LAPAROSCOPIC (IRRIGATION / IRRIGATOR) ×3 IMPLANT
SET MICROPUNCTURE 5F STIFF (MISCELLANEOUS) ×3 IMPLANT
SHEATH PINNACLE 8F 10CM (SHEATH) ×9 IMPLANT
SOL ANTI FOG 6CC (MISCELLANEOUS) ×2 IMPLANT
SOLUTION ANTI FOG 6CC (MISCELLANEOUS) ×1
SUT BONE WAX W31G (SUTURE) ×3 IMPLANT
SUT ETHIBOND X763 2 0 SH 1 (SUTURE) ×3 IMPLANT
SUT GORETEX CV 4 TH 22 36 (SUTURE) IMPLANT
SUT GORETEX CV4 TH-18 (SUTURE) IMPLANT
SUT PROLENE 3 0 SH DA (SUTURE) ×6 IMPLANT
SUT PROLENE 3 0 SH1 36 (SUTURE) ×18 IMPLANT
SYSTEM SAHARA CHEST DRAIN ATS (WOUND CARE) ×6 IMPLANT
TOWEL GREEN STERILE (TOWEL DISPOSABLE) ×3 IMPLANT
TOWEL GREEN STERILE FF (TOWEL DISPOSABLE) ×3 IMPLANT
TRAY FOLEY SLVR 16FR TEMP STAT (SET/KITS/TRAYS/PACK) ×3 IMPLANT
TROCAR XCEL BLADELESS 5X75MML (TROCAR) ×3 IMPLANT
TROCAR XCEL NON-BLD 11X100MML (ENDOMECHANICALS) ×6 IMPLANT
TUBE SUCT INTRACARD DLP 20F (MISCELLANEOUS) IMPLANT
TUNNELER SHEATH ON-Q 11GX8 DSP (PAIN MANAGEMENT) IMPLANT
UNDERPAD 30X36 HEAVY ABSORB (UNDERPADS AND DIAPERS) ×3 IMPLANT
WATER STERILE IRR 1000ML POUR (IV SOLUTION) ×6 IMPLANT
WIRE EMERALD 3MM-J .035X150CM (WIRE) ×3 IMPLANT
WIRE TORQFLEX AUST .018X40CM (WIRE) ×3 IMPLANT

## 2020-03-30 NOTE — Progress Notes (Signed)
Patient ID: Jorge [name], male   DOB: 1966/01/20, 54 y.o.   MRN: [id number]  [facility] Evening Rounds:   Hemodynamically stable  CI = 2.1 Atrial paced at 70.  Extubated in OR.  Urine output good  CT output low  CBC    Component Value Date/Time   WBC 23.2 (H) 03/30/2020 1323   RBC 3.63 (L) 03/30/2020 1323   HGB 11.2 (L) 03/30/2020 1323   HGB 12.9 (L) 01/31/2020 0807   HCT 31.8 (L) 03/30/2020 1323   HCT 38.1 01/31/2020 0807   PLT 216 03/30/2020 1323   PLT 254 01/31/2020 0807   MCV 87.6 03/30/2020 1323   MCV 89 01/31/2020 0807   MCH 30.9 03/30/2020 1323   MCHC 35.2 03/30/2020 1323   RDW 12.1 03/30/2020 1323   RDW 13.9 01/31/2020 0807   LYMPHSABS 1.6 04/06/2019 1105   MONOABS 0.5 03/21/2014 0741   EOSABS 0.1 04/06/2019 1105   BASOSABS 0.0 04/06/2019 1105     BMET    Component Value Date/Time   NA 143 03/30/2020 1238   NA 140 01/31/2020 0807   K 3.8 03/30/2020 1238   CL 104 03/30/2020 1211   CO2 21 (L) 03/27/2020 1500   GLUCOSE 148 (H) 03/30/2020 1211   BUN 12 03/30/2020 1211   BUN 16 01/31/2020 0807   CREATININE 0.50 (L) 03/30/2020 1211   CREATININE 0.93 01/24/2016 1640   CALCIUM 9.3 03/27/2020 1500   GFRNONAA >60 03/27/2020 1500   GFRAA 117 01/31/2020 0807     A/P:  Stable postop course. Continue current plans

## 2020-03-30 NOTE — Plan of Care (Signed)
  Problem: Education: Goal: Knowledge of General Education information will improve Description Including pain rating scale, medication(s)/side effects and non-pharmacologic comfort measures Outcome: Progressing   

## 2020-03-30 NOTE — Interval H&P Note (Signed)
History and Physical Interval Note:  03/30/2020 6:11 AM  Jorge [name]  has presented today for surgery, with the diagnosis of AFIB.  The various methods of treatment have been discussed with the patient and family. After consideration of risks, benefits and other options for treatment, the patient has consented to  Procedure(s): MINIMALLY INVASIVE MAZE PROCEDURE (N/A) TRANSESOPHAGEAL ECHOCARDIOGRAM (TEE) (N/A) as a surgical intervention.  The patient's history has been reviewed, patient examined, no change in status, stable for surgery.  I have reviewed the patient's chart and labs.  Questions were answered to the patient's satisfaction.     Rexene Alberts

## 2020-03-30 NOTE — H&P (Signed)
[address]       [place] [address]             [phone]          CARDIOTHORACIC SURGERY HISTORY AND PHYSICAL EXAM  Referring Provider is Thompson Grayer, MD Primary Cardiologist is Lauree Chandler, MD PCP is Jerrol Banana., MD  Chief Complaint  Patient presents with  . Atrial Fibrillation    Surgical eval    HPI:  Patient is a 54 year old male with history of coronary artery disease status post PCI and stenting of the left anterior descending coronary artery in 2019, hypertension, obstructive sleep apnea on CPAP, hyperlipidemia, and chronic recurrent paroxysmal atrial fibrillation on long-term anticoagulation who has been referred for surgical consultation to discuss treatment options for management of symptomatic atrial fibrillation that has failed multiple drugs, cardioversion, and catheter-based ablation.  Patient states that he first began to have intermittent symptoms of tachypalpitations more than 20 years ago.  He was first diagnosed with atrial fibrillation in 2011.  He has failed multiple attempts at cardioversion and pharmacological therapy with Multaq, flecainide, amiodarone, and most recently Tikosyn.  He underwent catheter-based ablation in July 2015 and repeat ablation in December 2015.  He has been followed ever since by Dr. Rayann Heman.  Nuclear stress test in 2018 revealed normal perfusion but diagnostic cardiac catheterization performed August 2019 revealed high-grade stenosis of the mid left anterior descending coronary artery with mild to moderate disease in the right coronary artery and left circumflex territories.  He underwent PCI and stenting of left anterior descending coronary artery by Dr. Angelena Form at that time.  He underwent sleep study which did reveal findings consistent with sleep apnea and he now wears CPAP at night.  Since catheter-based ablation he has had less symptoms of palpitations but he continues to have frequent  intermittent episodes of exertional shortness of breath and fatigue, decreased exercise tolerance, intermittent chest tightness, and occasional palpitations.  He monitors his pulse and heart rate regularly and frequently has heart rate elevated as high as 170 to 180 bpm. He has had difficulty with medications for rate control including intolerance of metoprolol.  Follow-up exercise treadmill performed March of this year was felt to be low risk for ischemia.  Implantable loop recorder was placed in May of this year and has documented that the patient's atrial fibrillation burden recently has remained as high as 70%.  He remains chronically anticoagulated using Xarelto and he has not had any bleeding complications nor history of TIA or stroke.  Surgical consultation was requested to consider Maze procedure.  Patient is married and lives locally in [place] with his wife.  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  He has remained reasonably active physically although he complains that he has had to cut back considerably on activity over the last few years.  He complains of exertional shortness of breath and fatigue which limits his lifestyle considerably.  He reports occasional episodes of tightness across his chest that seem to correlate with periods where his heart rate is running fast.  He has occasional palpitations and occasional dizzy spells without syncope.  He denies any resting shortness of breath, PND, orthopnea, or lower extremity edema.  He recently underwent arthroscopy of his right knee for partially torn meniscus.  He seems to be recovering uneventfully.  Patient returns to the office today to further discuss treatment  options for management of symptomatic atrial fibrillation that has failed multiple drugs,cardioversion, and catheter-based ablation.  He was originally seen in consultation on January 12, 2020.  Subsequent to his previous visit the patient made a decision to proceed with further diagnostic testing and possible Maze procedure.  He underwent TEE and diagnostic cardiac catheterization on February 02, 2020.  TEE revealed normal left ventricular systolic function with ejection fraction estimated 55 to 60%.  There was mild mitral regurgitation and mild aortic insufficiency.  There was left atrial enlargement.  There was no thrombus seen in the left atrial appendage.  Diagnostic cardiac catheterization revealed patent stent in the mid left anterior descending coronary artery with no evidence for restenosis.  There was otherwise moderate nonobstructive coronary artery disease.  CT angiography with performed earlier today and the patient returns to our office for follow-up.  He reports no new problems or complaints over the past month.  He still complains that he seems to be in atrial fibrillation more often than he is and rhythm, and whenever he is out of rhythm he gets short of breath and tired much more easily.  He is having some palpitations.  He denies any chest pain.  The remainder of his review of systems is unchanged from previously.  Patientreturns to the office today with tentative plans to proceed with minimally invasive maze procedure later this week.  He was last seen here in our office on February 07, 2020 at which time we made tentative plans for surgery.  The patient reports no new problems or complaints over the last few weeks.  However, he was recently treated with oral amoxicillin for a possible upper respiratory tract infection related to a persistent cough without fever.  Symptoms resolved and the patient has just recently completed his prescription.  He has not been exposed to any persons with known or suspected COVID-19 infection.  He states that otherwise he has remained stable although he notes that he is mostly out of rhythm and persistent atrial fibrillation.  He stopped taking  Xarelto and aspirin last week in anticipation of his upcoming surgery.  He has no other complaints.   Past Medical History:  Diagnosis Date  . Allergic rhinitis, seasonal   . Anticoagulant long-term use managed by cardiology   asa and xarelto  . Bilateral nephrolithiasis    urologist--- dr Junious Silk ([facility] office)  . Coronary artery disease cardiologist--- dr Angelena Form   nuclear stress test 10-18-2016 w/ normal perfusion;  cardiac cath 12-23-2017, PCI w/ DES to midLAD and mild RCA/ moderate CFx diease nonobstructive , normal LVF;  ETT 07-13-2019 negative for ischemia  . Dysrhythmia    Afib  . History of kidney stones   . Hyperlipidemia   . Hypertension    followed by pcp and cardiology  . OA (osteoarthritis)    knees  . OSA on CPAP    12-31-2019  per pt uses cpap every night  . PAF (paroxysmal atrial fibrillation) ([facility]) 2011   followed by dr Rayann Heman (ep)---  first dx 2011;  s/p DCCV's and ablation x2 in 2015 and last one 03/ 2018, and s/p loop recorder 05/ 2021;   failed multaq, flecainide, and amiodarone  . Pneumonia   . Right knee meniscal tear   . S/P ablation of atrial fibrillation followed by dr allred   x3  last one 2018  . S/P drug eluting coronary stent placement 12/23/2017   x1  to midLAD  . SOB (shortness of breath)  12-31-2019  per pt only when he has atrial fib , otherwise no issues with sob with activities  . Status post placement of implantable loop recorder 09/08/2019   followed by dr Rayann Heman    Past Surgical History:  Procedure Laterality Date  . APPENDECTOMY  1975  . ATRIAL FIBRILLATION ABLATION N/A 11/02/2013   PVI by Dr Rayann Heman  . ATRIAL FIBRILLATION ABLATION N/A 03/29/2014   PVI and CTI by Dr Rayann Heman  . ATRIAL FIBRILLATION ABLATION N/A 07/04/2016   Procedure: Atrial Fibrillation Ablation;  Surgeon: Thompson Grayer, MD;  Location: [facility];  Service: Cardiovascular;  Laterality: N/A;  . CARDIOVERSION N/A 10/15/2013   Procedure: CARDIOVERSION;   Surgeon: Fay Records, MD;  Location: [facility];  Service: Cardiovascular;  Laterality: N/A;  . COLONOSCOPY  2016  . CORONARY STENT INTERVENTION N/A 12/23/2017   Procedure: CORONARY STENT INTERVENTION;  Surgeon: Burnell Blanks, MD;  Location: [facility];  Service: Cardiovascular;  Laterality: N/A;  . EXTRACORPOREAL SHOCK WAVE LITHOTRIPSY  2005;  2013  . implantable loop recorder placement  09/08/2019   (done in office)   Medtronic Reveal Linq model LNQ 22 (SN [id number]) implantable loop recorder implanted in office by Dr Rayann Heman for afib management  . INGUINAL HERNIA REPAIR Left 05/31/2015   Procedure: HERNIA REPAIR INGUINAL ADULT;  Surgeon: Robert Bellow, MD;  Location: [facility];  Service: General;  Laterality: Left;  . KNEE ARTHROSCOPY Left 2009   @[facility]   . KNEE ARTHROSCOPY WITH MEDIAL MENISECTOMY Right 01/04/2020   Procedure: KNEE ARTHROSCOPY WITH PARTIAL MEDIAL MENISECTOMY;  Surgeon: Nicholes Stairs, MD;  Location[facility];  Service: Orthopedics;  Laterality: Right;  . RHINOPLASTY  2006    @[facility]   . RIGHT/LEFT HEART CATH AND CORONARY ANGIOGRAPHY N/A 12/23/2017   Procedure: RIGHT/LEFT HEART CATH AND CORONARY ANGIOGRAPHY;  Surgeon: Burnell Blanks, MD;  Location: [facility];  Service: Cardiovascular;  Laterality: N/A;  . RIGHT/LEFT HEART CATH AND CORONARY ANGIOGRAPHY N/A 02/02/2020   Procedure: RIGHT/LEFT HEART CATH AND CORONARY ANGIOGRAPHY;  Surgeon: Burnell Blanks, MD;  Location: [facility];  Service: Cardiovascular;  Laterality: N/A;  . ROBOT ASSISTED INGUINAL HERNIA REPAIR Left 05-01-2013   @[facility]   . TEE WITHOUT CARDIOVERSION N/A 11/01/2013   Procedure: TRANSESOPHAGEAL ECHOCARDIOGRAM (TEE);  Surgeon: Candee Furbish, MD;  Location: [facility];  Service: Cardiovascular;  Laterality: N/A;  . TEE WITHOUT CARDIOVERSION N/A 03/28/2014   Procedure: TRANSESOPHAGEAL ECHOCARDIOGRAM (TEE);  Surgeon: Larey Dresser, MD;  Location:  [facility];  Service: Cardiovascular;  Laterality: N/A;  . TEE WITHOUT CARDIOVERSION N/A 02/02/2020   Procedure: TRANSESOPHAGEAL ECHOCARDIOGRAM (TEE);  Surgeon: Werner Lean, MD;  Location: [facility];  Service: Cardiovascular;  Laterality: N/A;    Family History  Problem Relation Age of Onset  . Cancer Mother        breast, cause of death  . Heart attack Maternal Grandfather 53       Cause of death  . Hypertension Maternal Grandfather   . Heart failure Brother   . CVA Paternal Grandmother        cause of death  . Atrial fibrillation Maternal Grandmother   . Colon polyps Other        Fam Hx  . Kidney cancer Neg Hx   . Bladder Cancer Neg Hx   . Prostate cancer Neg Hx     Social History Social History   Tobacco Use  . Smoking status: Never Smoker  .  Smokeless tobacco: Never Used  Vaping Use  . Vaping Use: Never used  Substance Use Topics  . Alcohol use: Yes    Alcohol/week: 6.0 - 12.0 standard drinks    Types: 6 - 12 Cans of beer per week    Comment: occasionally  . Drug use: Never    Prior to Admission medications   Medication Sig Start Date End Date Taking? Authorizing Provider  amoxicillin (AMOXIL) 875 MG tablet Take 1 tablet (875 mg total) by mouth 2 (two) times daily. 03/20/20  Yes Chrismon, Vickki Muff, PA  aspirin 81 MG EC tablet Take 81 mg by mouth daily.  03/05/11  Yes [provider]  aspirin-acetaminophen-caffeine (EXCEDRIN MIGRAINE) [phone] MG tablet Take 2 tablets by mouth every 6 (six) hours as needed for headache.    Yes [provider]  azelastine (ASTELIN) 0.1 % nasal spray Place 1 spray into both nostrils 2 (two) times daily. Patient taking differently: Place 1 spray into both nostrils 2 (two) times daily as needed for allergies.  12/16/19  Yes Jerrol Banana., MD  diltiazem (CARDIZEM CD) 240 MG 24 hr capsule Take 1 capsule (240 mg total) by mouth daily. Patient taking differently: Take 240 mg by mouth every evening.   01/13/20  Yes Allred, Jeneen Rinks, MD  dofetilide (TIKOSYN) 500 MCG capsule Take 1 capsule (500 mcg total) by mouth 2 (two) times daily. 09/13/19  Yes Allred, Jeneen Rinks, MD  fluticasone (FLONASE) 50 MCG/ACT nasal spray Place 2 sprays into both nostrils daily. 12/16/19  Yes Jerrol Banana., MD  ibuprofen (ADVIL) 200 MG tablet Take 800 mg by mouth every 6 (six) hours as needed for headache or moderate pain.   Yes [provider]  losartan (COZAAR) 50 MG tablet Take 1 tablet (50 mg total) by mouth daily. Patient taking differently: Take 50 mg by mouth every evening.  01/10/20  Yes Jerrol Banana., MD  montelukast (SINGULAIR) 10 MG tablet Take 1 tablet (10 mg total) by mouth at bedtime. 12/16/19  Yes Jerrol Banana., MD  rosuvastatin (CRESTOR) 40 MG tablet Take 1 tablet (40 mg total) by mouth daily. Patient taking differently: Take 40 mg by mouth every evening.  07/16/19  Yes Jerrol Banana., MD  XARELTO 20 MG TABS tablet Take 1 tablet (20 mg total) by mouth daily with supper. Patient taking differently: Take 20 mg by mouth daily with supper.  11/22/19  Yes Allred, Jeneen Rinks, MD  tamsulosin (FLOMAX) 0.4 MG CAPS capsule Take 1 capsule (0.4 mg total) by mouth daily after supper. 12/17/19   Festus Aloe, MD  traMADol (ULTRAM) 50 MG tablet Take 1 tablet (50 mg total) by mouth every 6 (six) hours as needed. 12/17/19   Festus Aloe, MD    Allergies  Allergen Reactions  . Hydrocodone Nausea And Vomiting  . Oxycodone Nausea And Vomiting     Review of Systems:              General:                      normal appetite, decreased energy, no weight gain, no weight loss, no fever             Cardiac:                       + chest pain with exertion, no chest pain at rest, +SOB with exertion, no resting SOB, no PND, no orthopnea, +  palpitations, + arrhythmia, + atrial fibrillation, no LE edema, + dizzy spells, no syncope             Respiratory:                 no shortness of  breath, no home oxygen, no productive cough, no dry cough, no bronchitis, no wheezing, no hemoptysis, no asthma, no pain with inspiration or cough, + sleep apnea, + CPAP at night             GI:                               no difficulty swallowing, no reflux, no frequent heartburn, no hiatal hernia, no abdominal pain, no constipation, no diarrhea, no hematochezia, no hematemesis, no melena             GU:                              no dysuria,  + frequency, no urinary tract infection, no hematuria, + enlarged prostate, no kidney stones, no kidney disease             Vascular:                     no pain suggestive of claudication, no pain in feet, no leg cramps, no varicose veins, no DVT, no non-healing foot ulcer             Neuro:                         no stroke, no TIA's, no seizures, no headaches, no temporary blindness one eye,  no slurred speech, no peripheral neuropathy, no chronic pain, no instability of gait, no memory/cognitive dysfunction             Musculoskeletal:         + arthritis, no joint swelling, no myalgias, no difficulty walking, normal mobility              Skin:                            no rash, no itching, no skin infections, no pressure sores or ulcerations             Psych:                         no anxiety, no depression, no nervousness, no unusual recent stress             Eyes:                           no blurry vision, no floaters, no recent vision changes,  wears glasses for reading only             ENT:                            no hearing loss, no loose or painful teeth, no dentures, last saw dentist within the past year             Hematologic:               no  easy bruising, no abnormal bleeding, no clotting disorder, no frequent epistaxis             Endocrine:                   no diabetes, does not check CBG's at home                           Physical Exam:              BP 136/85   Pulse 78   Temp 97.7 F (36.5 C) (Skin)   Resp 20   Ht 5'  10" (1.778 m)   Wt 195 lb (88.5 kg)   SpO2 96% Comment: RA  BMI 27.98 kg/m              General:                        well-appearing             HEENT:                       Unremarkable              Neck:                           no JVD, no bruits, no adenopathy              Chest:                          clear to auscultation, symmetrical breath sounds, no wheezes, no rhonchi              CV:                              Irregular rate and rhythm, no murmur              Abdomen:                    soft, non-tender, no masses              Extremities:                 warm, well-perfused, pulses palpable, no LE edema             Rectal/GU                   Deferred             Neuro:                         Grossly non-focal and symmetrical throughout             Skin:                            Clean and dry, no rashes, no breakdown   Diagnostic Tests:  EKG:   Atrial fibrillation w/out acute ischemic changes, HR 113 (12/16/2019)     ECHOCARDIOGRAM REPORT       Patient Name:  BRIER REID II Date of Exam: 05/12/2019  Medical Rec #: [id number]     Height:  70.0 in  Accession #:  [id number]     Weight:    196.2 lb  Date of Birth: 12/04/1965     BSA:     2.07 m  Patient Age:  60 years      BP:      128/82 mmHg  Patient Gender: M         HR:      109 bpm.  Exam Location: [facility]   Procedure: 2D Echo, Cardiac Doppler and Color Doppler   Indications:  I35.1    History:    Patient has prior history of Echocardiogram examinations,  most         recent 01/26/2015. CAD, AI, Arrythmias:Atrial Fibrillation;  Risk         Factors:Hypertension and Dyslipidemia.    Sonographer:  Coralyn Helling RDCS  Referring Phys: [place]    1. Left ventricular ejection fraction, by visual estimation, is 50 to  55%. The left ventricle has normal  function. Left ventricular septal wall  thickness was moderately increased. Moderately increased left ventricular  posterior wall thickness. There is  no left ventricular hypertrophy.  2. Left ventricular diastolic function could not be evaluated.  3. The left ventricle has no regional wall motion abnormalities.  4. Global right ventricle has normal systolic function.The right  ventricular size is normal. No increase in right ventricular wall  thickness.  5. Left atrial size was normal.  6. Right atrial size was normal.  7. Mild calcification of the anterior mitral valve leaflet(s).  8. The mitral valve is degenerative. Trivial mitral valve regurgitation.  No evidence of mitral stenosis.  9. The tricuspid valve is normal in structure.  10. The aortic valve is tricuspid. Aortic valve regurgitation is mild.  Mild aortic valve sclerosis without stenosis.  11. The pulmonic valve was normal in structure. Pulmonic valve  regurgitation is not visualized.  12. TR signal is inadequate for assessing pulmonary artery systolic  pressure.  13. The inferior vena cava is normal in size with greater than 50%  respiratory variability, suggesting right atrial pressure of 3 mmHg.   FINDINGS  Left Ventricle: Left ventricular ejection fraction, by visual estimation,  is 50 to 55%. The left ventricle has normal function. The left ventricle  has no regional wall motion abnormalities. Moderately increased left  ventricular posterior wall thickness.  There is no left ventricular hypertrophy. The left ventricular diastology  could not be evaluated due to atrial fibrillation. Left ventricular  diastolic function could not be evaluated. Normal left atrial pressure.   Right Ventricle: The right ventricular size is normal. No increase in  right ventricular wall thickness. Global RV systolic function is has  normal systolic function.   Left Atrium: Left atrial size was normal in size.   Right  Atrium: Right atrial size was normal in size   Pericardium: There is no evidence of pericardial effusion.   Mitral Valve: The mitral valve is degenerative in appearance. There is  mild calcification of the anterior mitral valve leaflet(s). Trivial mitral  valve regurgitation. No evidence of mitral valve stenosis by observation.   Tricuspid Valve: The tricuspid valve is normal in structure. Tricuspid  valve regurgitation is mild.   Aortic Valve: The aortic valve is tricuspid. Aortic valve regurgitation is  mild. Mild aortic valve sclerosis is present, with no evidence of aortic  valve stenosis.   Pulmonic Valve: The pulmonic valve was normal in structure. Pulmonic valve  regurgitation is not  visualized. Pulmonic regurgitation is not visualized.   Aorta: The aortic root, ascending aorta and aortic arch are all  structurally normal, with no evidence of dilitation or obstruction.   Venous: The inferior vena cava is normal in size with greater than 50%  respiratory variability, suggesting right atrial pressure of 3 mmHg.   IAS/Shunts: No atrial level shunt detected by color flow Doppler. There is  no evidence of a patent foramen ovale. No ventricular septal defect is  seen or detected. There is no evidence of an atrial septal defect.     LEFT VENTRICLE  PLAX 2D  LVIDd:     4.30 cm  LVIDs:     2.60 cm  LV PW:     1.60 cm  LV IVS:    1.30 cm  LVOT diam:   2.10 cm  LV SV:     58 ml  LV SV Index:  27.65  LVOT Area:   3.46 cm     IVC  IVC diam: 0.70 cm   LEFT ATRIUM       Index    RIGHT ATRIUM      Index  LA diam:    3.60 cm 1.74 cm/m RA Pressure: 3.00 mmHg  LA Vol (A2C):  60.9 ml 29.41 ml/m RA Area:   19.90 cm  LA Vol (A4C):  50.3 ml 24.30 ml/m RA Volume:  61.50 ml 29.70 ml/m  LA Biplane Vol: 59.9 ml 28.93 ml/m  AORTIC VALVE  LVOT Vmax:  83.80 cm/s  LVOT Vmean: 53.580 cm/s  LVOT VTI:  0.126 m    AORTA  Ao  Root diam: 3.60 cm  Ao Asc diam: 3.40 cm   MITRAL VALVE            TRICUSPID VALVE  MV Area (PHT):           Estimated RAP: 3.00 mmHg    MV Decel Time: 232 msec      SHUNTS  MV E velocity: 77.02 cm/s 103 cm/s Systemic VTI: 0.13 m                   Systemic Diam: 2.10 cm     Fransico Him MD  Electronically signed by Fransico Him MD  Signature Date/Time: 05/12/2019/9:17:02 AM     CORONARY STENT INTERVENTION  RIGHT/LEFT HEART CATH AND CORONARY ANGIOGRAPHY  Conclusion    Mid RCA lesion is 30% stenosed.  Post Atrio lesion is 30% stenosed.  Prox Cx lesion is 30% stenosed.  Ost 2nd Mrg lesion is 30% stenosed.  Prox LAD lesion is 80% stenosed.  Mid Cx lesion is 50% stenosed.  Ost 1st Diag lesion is 20% stenosed.  A drug-eluting stent was successfully placed using a STENT RESOLUTE ONYX 3.0X12.  Post intervention, there is a 0% residual stenosis.  The left ventricular systolic function is normal.  LV end diastolic pressure is normal.  The left ventricular ejection fraction is 50-55% by visual estimate.  There is no mitral valve regurgitation.  1. Severe stenosis mid LAD just after a large Diagonal branch 2. Successful PTCA/DES x 1 mid LAD 3. Moderate non-obstructive disease in the Circumflex and obtuse marginal branch 4. Mild disease in the large dominant RCA 5. Normal LV systolic function  Recommendations: Same day PCI discharge. Will increase Lipitor to 80 mg daily. Will need Lipids and LFTs in 12 weeks.  Recommend uninterrupted dual antiplatelet therapy with Aspirin 81mg  daily and Clopidogrel 75mg  daily for a minimum of 6 months (stable ischemic heart  disease - Class I recommendation).If he tolerates DAPT, would continue for one full year.   Indications  Unstable angina (HCC) [I20.0 (ICD-10-CM)]  Procedural Details  Technical Details Indication: 54 yo male with history of atrial fibrillation,  hyperlipidemia with dyspnea and chest pain c/w unstable angina.   Procedure: The risks, benefits, complications, treatment options, and expected outcomes were discussed with the patient. The patient and/or family concurred with the proposed plan, giving informed consent. The patient was brought to the cath lab after IV hydration was given. The patient was sedated with Versed and Fentanyl. I changed the IV catheter in the right antecubital vein with a 5 French sheath. Right heart cath performed with a balloon tipped catheter. The right wrist was prepped and draped in a sterile fashion. 1% lidocaine was used for local anesthesia. Using the modified Seldinger access technique, a 5 French sheath was placed in the right radial artery. 3 mg Verapamil was given through the sheath. 4500 units IV heparin was given. Standard diagnostic catheters were used to perform selective coronary angiography. A pigtail catheter was used to perform a left ventricular angiogram.   PCI Note: XB LAD 3.5 guiding catheter to engage the left main. Heparin used for anti-coagulation. ACT over 300. Plavix 600 mg po x 1. Cougar IC wire advanced down the LAD. 2.5 x 12 mm balloon used to dilate the stenosis in the mid LAD. I then deployed a 3.0 x 12 mm Resolute Onyx DES in the mid LAD. The stent was post-dilated with a 3.25 x 6 mm Levering balloon x 2. Excellent flow down the Diagonal branch following the PCI of the LAD with slight plaque shift to the ostium of the Diagonal branch.    The sheath was removed from the right radial artery and a Terumo hemostasis band was applied at the arteriotomy site on the right wrist.     Estimated blood loss <50 mL.  During this procedure the patient was administered the following to achieve and maintain moderate conscious sedation: Versed 4 mg, Fentanyl 100 mcg, while the patient's heart rate, blood pressure, and oxygen saturation were continuously monitored. The period of conscious sedation was 79  minutes, of which I was present face-to-face 100% of this time.  Medications (Filter: Administrations occurring from [phone] to 1031 on 12/23/17) (important) Continuous medications are totaled by the amount administered until 12/23/17 1031.  midazolam (VERSED) injection (mg) Total dose:  4 mg Date/Time  Rate/Dose/Volume Action  12/23/17 0851  2 mg Given  0913  1 mg Given  0941  1 mg Given    fentaNYL (SUBLIMAZE) injection (mcg) Total dose:  100 mcg Date/Time  Rate/Dose/Volume Action  12/23/17 0852  50 mcg Given  0913  25 mcg Given  0941  25 mcg Given    lidocaine (PF) (XYLOCAINE) 1 % injection (mL) Total volume:  6 mL Date/Time  Rate/Dose/Volume Action  12/23/17 0909  2 mL Given  0915  4 mL Given    Radial Cocktail/Verapamil only (mL) Total volume:  10 mL Date/Time  Rate/Dose/Volume Action  12/23/17 0917  10 mL Given    heparin injection (Units) Total dose:  14,000 Units Date/Time  Rate/Dose/Volume Action  12/23/17 0919  4,500 Units Given  0929  6,500 Units Given  0943  3,000 Units Given    clopidogrel (PLAVIX) tablet (mg) Total dose:  600 mg Date/Time  Rate/Dose/Volume Action  12/23/17 0935  600 mg Given    famotidine (PEPCID) IVPB 20 mg premix (mg) Total dose:  Cannot  be calculated* *Infusion rate not documented for continuous administration Date/Time  Rate/Dose/Volume Action  12/23/17 0939  20 mg - 100 mL/hr New Bag/Given  1007  (over 30 min) Stopped    nitroGLYCERIN 1 mg/10 mL (100 mcg/mL) - IR/CATH LAB (mcg) Total dose:  200 mcg Date/Time  Rate/Dose/Volume Action  12/23/17 1003  200 mcg Given    iopamidol (ISOVUE-370) 76 % injection (mL) Total volume:  180 mL Date/Time  Rate/Dose/Volume Action  12/23/17 1011  180 mL Given    Heparin (Porcine) in NaCl 1000-0.9 UT/500ML-% SOLN (mL) Total volume:  1,000 mL Date/Time  Rate/Dose/Volume Action  12/23/17 1011  500 mL Given  1011  500 mL Given    0.9 % sodium chloride infusion  (mL/hr) Total dose:  Cannot be calculated* Dosing weight:  86.2 *Continuous medication not stopped within the calculation time range. Date/Time  Rate/Dose/Volume Action  12/23/17 1016  10 mL/hr New Bag/Given  1028  30 mL/hr Rate/Dose Change    Sedation Time  Sedation Time Physician-1: 1 hour 14 minutes 36 seconds  Contrast  Medication Name Total Dose  iopamidol (ISOVUE-370) 76 % injection 180 mL    Radiation/Fluoro  Fluoro time: 10 (min) DAP: 95638 (mGycm2) Cumulative Air Kerma: 756 (mGy)  Complications  Complications documented before study signed (12/23/2017 12:33 PM)     Log Level Complications  None Documented by Burnell Blanks, MD 12/23/2017 10:42 AM  Time Range: Intraprocedure    Coronary Findings  Diagnostic Dominance: Right Left Anterior Descending  Vessel is large.  Prox LAD lesion is 80% stenosed.  First Diagonal Branch  Vessel is moderate in size.  Ost 1st Diag lesion is 20% stenosed.  Left Circumflex  Prox Cx lesion is 30% stenosed.  Mid Cx lesion is 50% stenosed.  First Obtuse Marginal Branch  Vessel is small in size.  Second Obtuse Marginal Branch  Vessel is moderate in size.  Ost 2nd Mrg lesion is 30% stenosed.  Right Coronary Artery  Vessel is large.  Mid RCA lesion is 30% stenosed.  Right Posterior Atrioventricular Artery  Vessel is large in size.  Post Atrio lesion is 30% stenosed.  Intervention  Prox LAD lesion  Stent  CATH VISTA GUIDE 6FR XBLAD3.5 guide catheter was inserted. Pre-stent angioplasty was performed using a BALLOON SAPPHIRE 2.5X12. A drug-eluting stent was successfully placed using a STENT RESOLUTE ONYX 3.0X12. Stent strut is well apposed. Post-stent angioplasty was performed using a BALLOON [place] EMERGE MR 3.25X6.  Post-Intervention Lesion Assessment  The intervention was successful. Pre-interventional TIMI flow is 3. Post-intervention TIMI flow is 3. No complications occurred at this lesion.  There is a 0%  residual stenosis post intervention.  Wall Motion       Resting               Left Heart  Left Ventricle The left ventricular size is normal. The left ventricular systolic function is normal. LV end diastolic pressure is normal. The left ventricular ejection fraction is 50-55% by visual estimate. No regional wall motion abnormalities. There is no evidence of mitral regurgitation.  Coronary Diagrams  Diagnostic Dominance: Right  Intervention        TRANSESOPHOGEAL ECHO REPORT       Patient Name:  YOSGART PAVEY II Date of Exam: 02/02/2020  Medical Rec #: [id number]     Height:    70.5 in  Accession #:  [id number]     Weight:    195.0 lb  Date of Birth: [birth year]  BSA:     2.076 m  Patient Age:  81 years      BP:      143/108 mmHg  Patient Gender: M         HR:      115 bpm.  Exam Location: Inpatient   Procedure: Transesophageal Echo, Cardiac Doppler and Color Doppler   Indications:   Atrial fibrillation    History:     Patient has prior history of Echocardiogram examinations,  most          recent 05/12/2019. CAD, Arrythmias:Atrial Fibrillation;  Risk          Factors:Hypertension, Dyslipidemia and Sleep Apnea.    Sonographer:   Borquez Lefort RDCS (AE)  Referring Phys: [id number] [facility] Spell  Diagnosing Phys: Rudean Haskell MD   PROCEDURE: After discussion of the risks and benefits of a TEE, an  informed consent was obtained from the patient. The transesophogeal probe  was passed without difficulty through the esophogus of the patient. Local  oropharyngeal anesthetic was provided  with Cetacaine. Sedation performed by performing physician. Patients was  under conscious sedation during this procedure. Anesthetic administered:  188mcg of Fentanyl, 7.0mg  of Versed. Image quality was good. The patient  developed no complications during  the procedure.    IMPRESSIONS    1. Left ventricular ejection fraction, by estimation, is 55 to 60%. The  left ventricle has normal function. The left ventricle has no regional  wall motion abnormalities. Left ventricular diastolic function could not  be evaluated.  2. Right ventricular systolic function is normal. The right ventricular  size is normal. Likely normal right ventricular thickness.  3. Left atrial size was dilated. No left atrial/left atrial appendage  thrombus was detected.  4. The mitral valve is normal in structure. Mild mitral valve  regurgitation.  5. The aortic valve is tricuspid. Aortic valve regurgitation is mild.   Conclusion(s)/Recommendation(s): Normal biventricular function without  evidence of hemodynamically significant valvular heart disease.   FINDINGS  Left Ventricle: Left ventricular ejection fraction, by estimation, is 55  to 60%. The left ventricle has normal function. The left ventricle has no  regional wall motion abnormalities. The left ventricular internal cavity  size was normal in size. There is  no left ventricular hypertrophy. Left ventricular diastolic function  could not be evaluated.   Right Ventricle: The right ventricular size is normal. Likely normal right  ventricular thickness. Right ventricular systolic function is normal.   Left Atrium: Left atrial size was dilated. No left atrial/left atrial  appendage thrombus was detected.   Right Atrium: Right atrial size was normal in size.   Pericardium: There is no evidence of pericardial effusion.   Mitral Valve: Mitra regurgitation at A2-P2 related to atrial dilation. The  mitral valve is normal in structure. Mild mitral valve regurgitation.   Tricuspid Valve: The tricuspid valve is normal in structure. Tricuspid  valve regurgitation is trivial.   Aortic Valve: Aortic Regurgitation is central. The aortic valve is  tricuspid. Aortic valve regurgitation is mild.   Pulmonic Valve: The  pulmonic valve was grossly normal. Pulmonic valve  regurgitation is not visualized.   Aorta: The aortic root is normal in size and structure and the aortic root  and ascending aorta are structurally normal, with no evidence of  dilitation. There is minimal (Grade I) plaque.   IAS/Shunts: The atrial septum is grossly normal.     LEFT VENTRICLE  PLAX 2D  LVOT diam:   2.10 cm  LVOT Area:   3.46 cm       AORTA  Ao Root diam: 3.60 cm  Ao Asc diam: 3.50 cm     SHUNTS  Systemic Diam: 2.10 cm   Rudean Haskell MD  Electronically signed by Rudean Haskell MD  Signature Date/Time: 02/02/2020/12:58:54 PM     RIGHT/LEFT HEART CATH AND CORONARY ANGIOGRAPHY  Conclusion    Ost 1st Diag lesion is 20% stenosed.  Previously placed Prox LAD drug eluting stent is widely patent.  Balloon angioplasty was performed.  Ost 2nd Mrg lesion is 30% stenosed.  Prox Cx lesion is 30% stenosed.  Mid Cx lesion is 50% stenosed.  Mid RCA lesion is 30% stenosed.  RPAV lesion is 40% stenosed.  1. Patent mid LAD stent with no restenosis 2. Mild to moderate mid Circumflex stenosis, unchanged from last cath 3. Mild to moderate right posterolateral artery stenosis  Recommendations: Continue medical management of CAD. No further ischemic workup. Proceed with planned surgical procedure.    Recommendations  Antiplatelet/Anticoag Continue medical management of CAD. No further ischemic workup. Proceed with planned surgical procedure.  Surgeon Notes    02/02/2020 10:58 AM CV Procedure addendum by Werner Lean, MD  Indications  Pre-operative clearance [phone] (ICD-10-CM)]  Coronary artery disease involving native coronary artery of native heart without angina pectoris [I25.10 (ICD-10-CM)]  Procedural Details  Technical Details Indication: 54 yo male with atrial fibrillation with pending MAZE procedure. Cardiac cath pre-operatively to exclude  progression of CAD  Procedure: The risks, benefits, complications, treatment options, and expected outcomes were discussed with the patient. The patient and/or family concurred with the proposed plan, giving informed consent. The patient was brought to the cath lab after IV hydration was given. The patient was sedated with Versed and Fentanyl. The Right antecubital vein IV was changed for a 5 French sheath. Right heart catheterization performed with a balloon tipped catheter. The right wrist was prepped and draped in a sterile fashion. 1% lidocaine was used for local anesthesia. Using the modified Seldinger access technique, a 5 French sheath was placed in the right radial artery. 3 mg Verapamil was given through the sheath. 4500 units IV heparin was given. Standard diagnostic catheters were used to perform selective coronary angiography. LV pressures measured with the JR4 catheter. The sheath was removed from the right radial artery and a Terumo hemostasis band was applied at the arteriotomy site on the right wrist.    Estimated blood loss <50 mL.   During this procedure medications were administered to achieve and maintain moderate conscious sedation while the patient's heart rate, blood pressure, and oxygen saturation were continuously monitored and I was present face-to-face 100% of this time.  Medications (Filter: Administrations occurring from 1335 to 1417 on 02/02/20) (important) Continuous medications are totaled by the amount administered until 02/02/20 1417.  fentaNYL (SUBLIMAZE) injection (mcg) Total dose:  50 mcg Date/Time  Rate/Dose/Volume Action  02/02/20 1343  50 mcg Given    midazolam (VERSED) injection (mg) Total dose:  2 mg Date/Time  Rate/Dose/Volume Action  02/02/20 1343  2 mg Given    lidocaine (PF) (XYLOCAINE) 1 % injection (mL) Total volume:  5 mL Date/Time  Rate/Dose/Volume Action  02/02/20 1351  5 mL Given    Radial Cocktail/Verapamil only (mL) Total  volume:  10 mL Date/Time  Rate/Dose/Volume Action  02/02/20 1353  10 mL Given    Heparin (Porcine) in NaCl 1000-0.9 UT/500ML-% SOLN (mL) Total volume:  1,000 mL Date/Time  Rate/Dose/Volume Action  02/02/20 1354  500 mL Given  1354  500 mL Given    heparin sodium (porcine) injection (Units) Total dose:  4,500 Units Date/Time  Rate/Dose/Volume Action  02/02/20 1403  4,500 Units Given    iohexol (OMNIPAQUE) 350 MG/ML injection (mL) Total volume:  50 mL Date/Time  Rate/Dose/Volume Action  02/02/20 1405  50 mL Given    Sedation Time  Sedation Time Physician-1: 26 minutes 36 seconds  Contrast  Medication Name Total Dose  iohexol (OMNIPAQUE) 350 MG/ML injection 50 mL    Radiation/Fluoro  Fluoro time: 3 (min) DAP: 50093 (mGycm2) Cumulative Air Kerma: 818 (mGy)  Complications  Complications documented before study signed (02/02/2020 2:17 PM)   RIGHT/LEFT HEART CATH AND CORONARY ANGIOGRAPHY  None Documented by Burnell Blanks, MD 02/02/2020 2:13 PM  Date Found: 02/02/2020  Time Range: Intraprocedure      Coronary Findings  Diagnostic Dominance: Right Left Anterior Descending  Vessel is large.  Previously placed Prox LAD drug eluting stent is widely patent.  First Diagonal Branch  Vessel is moderate in size.  Ost 1st Diag lesion is 20% stenosed.  Left Circumflex  Prox Cx lesion is 30% stenosed.  Mid Cx lesion is 50% stenosed.  First Obtuse Marginal Branch  Vessel is small in size.  Second Obtuse Marginal Branch  Vessel is moderate in size.  Ost 2nd Mrg lesion is 30% stenosed.  Right Coronary Artery  Vessel is large.  Mid RCA lesion is 30% stenosed.  Right Posterior Atrioventricular Artery  Vessel is large in size.  RPAV lesion is 40% stenosed.  Intervention  No interventions have been documented. Coronary Diagrams  Diagnostic Dominance: Right          Impression:  Patient has chronic recurrent paroxysmal atrial  fibrillation which is now approaching longstanding persistent atrial fibrillation and has failed pharmacologic therapy on multiple drugs as well as 2 previous attempts at catheter-based ablation. Recent interrogation of implantable loop recorder documents that the patient remains in atrial fibrillation more than 70% of the time. He remains symptomatic with primary complaints of exertional shortness of breath and fatigue as well as occasional chest tightness,palpitations and dizzy spells.  I have personally reviewed the patient's recentTEE, diagnostic cardiac catheterization,and CT angiogram.The patient has dilated left atrium with no evidence for left atrial clot. There is normal left ventricular systolic function. There is mild aortic insufficiency and mild mitral regurgitation. Catheterization is notable for widely patent stent in the left anterior descending coronary artery and otherwise moderate nonobstructive coronary artery disease. The patient CT angiogram has not yet been officially read by a radiologist. However, there do not appear to be any contraindications to peripheral cannulation for surgery.    Plan:  I haveagaindiscussed the natural history of atrial fibrillation at length with the patient and his wife in the office today. We discussed the differences between primary lone atrial fibrillation and atrial fibrillation associated with other cardiac or respiratory problems. We discussed treatment strategies including rate control with long-term anticoagulation, pharmacologic therapy, catheter-based therapies, and a variety of surgical options. We discussed the rationale for surgical Maze procedure andour local experience for patients both with and without need for concomitant surgical procedures. We discussed alternative surgical approaches including both conventional sternotomy and minithoracotomy approach for a full Maze procedure versus other less invasive surgical  approaches without use of cardiopulmonary bypass such as the convergent procedure. Expectations for the patient's postoperative convalescence and long-term freedom from atrial fibrillation have been discussed.  The patient understands and accepts all potential risks  of surgery including but not limited to risk of death, stroke or other neurologic complication, myocardial infarction, congestive heart failure, respiratory failure, renal failure, bleeding requiring transfusion and/or reexploration, arrhythmia, infection or other wound complications, pneumonia, pleural and/or pericardial effusion, pulmonary embolus, aortic dissection or other major vascular complication, or late recurrence of atrial fibrillation or atypical atrial flutter.  Specific risks potentially related to the minimally-invasive approach were discussed at length, including but not limited to risk of conversion to full or partial sternotomy, aortic dissection or other major vascular complication, unilateral acute lung injury or pulmonary edema, phrenic nerve dysfunction or paralysis, rib fracture, chronic pain, lung hernia, or lymphocele. All of their questions have been answered.       Valentina Gu. Roxy Manns, MD 03/27/2020 3:30 PM

## 2020-03-30 NOTE — Progress Notes (Signed)
  Echocardiogram Echocardiogram Transesophageal has been performed.  Michiel Cowboy 03/30/2020, 8:54 AM

## 2020-03-30 NOTE — Transfer of Care (Signed)
Immediate Anesthesia Transfer of Care Note  Patient: DAWAYNE OHAIR II  Procedure(s) Performed: MINIMALLY INVASIVE MAZE PROCEDURE (N/A ) TRANSESOPHAGEAL ECHOCARDIOGRAM (TEE) (N/A ) CLIPPING OF ATRIAL APPENDAGE USING ATRICURE ATRICLIP [id number] (Chest)  Patient Location: SICU  Anesthesia Type:General  Level of Consciousness: awake, alert  and patient cooperative  Airway & Oxygen Therapy: Patient Spontanous Breathing and Patient connected to face mask oxygen  Post-op Assessment: Report given to RN and Post -op Vital signs reviewed and stable  Post vital signs: Reviewed and stable  Last Vitals:  Vitals Value Taken Time  BP 122/90 03/30/20 1313  Temp    Pulse 80 03/30/20 1314  Resp 21 03/30/20 1314  SpO2 99 % 03/30/20 1314  Vitals shown include unvalidated device data.  Last Pain:  Vitals:   03/30/20 0627  PainSc: 6          Complications: No complications documented.

## 2020-03-30 NOTE — Hospital Course (Addendum)
HPI: Patient is a 54 year old male with history of coronary artery disease status post PCI and stenting of the left anterior descending coronary artery in 2019, hypertension, obstructive sleep apnea on CPAP, hyperlipidemia, and chronic recurrent paroxysmal atrial fibrillation on long-term anticoagulation who has been referred for surgical consultation to discuss treatment options for management of symptomatic atrial fibrillation that has failed multiple drugs, cardioversion, and catheter-based ablation.   Patient states that he first began to have intermittent symptoms of tachypalpitations more than 20 years ago.  He was first diagnosed with atrial fibrillation in 2011.  He has failed multiple attempts at cardioversion and pharmacological therapy with Multaq, flecainide, amiodarone, and most recently Tikosyn.  He underwent catheter-based ablation in July 2015 and repeat ablation in December 2015.  He has been followed ever since by Dr. Rayann Heman.  Nuclear stress test in 2018 revealed normal perfusion but diagnostic cardiac catheterization performed August 2019 revealed high-grade stenosis of the mid left anterior descending coronary artery with mild to moderate disease in the right coronary artery and left circumflex territories.  He underwent PCI and stenting of left anterior descending coronary artery by Dr. Angelena Form at that time.  He underwent sleep study which did reveal findings consistent with sleep apnea and he now wears CPAP at night.  Since catheter-based ablation he has had less symptoms of palpitations but he continues to have frequent intermittent episodes of exertional shortness of breath and fatigue, decreased exercise tolerance, intermittent chest tightness, and occasional palpitations.  He monitors his pulse and heart rate regularly and frequently has heart rate elevated as high as 170 to 180 bpm. He has had difficulty with medications for rate control including intolerance of metoprolol.  Follow-up  exercise treadmill performed March of this year was felt to be low risk for ischemia.  Implantable loop recorder was placed in May of this year and has documented that the patient's atrial fibrillation burden recently has remained as high as 70%.  He remains chronically anticoagulated using Xarelto and he has not had any bleeding complications nor history of TIA or stroke.  Surgical consultation was requested to consider Maze procedure.   Patient is married and lives locally in [place] with his wife.  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  He has remained reasonably active physically although he complains that he has had to cut back considerably on activity over the last few years.  He complains of exertional shortness of breath and fatigue which limits his lifestyle considerably.  He reports occasional episodes of tightness across his chest that seem to correlate with periods where his heart rate is running fast.  He has occasional palpitations and occasional dizzy spells without syncope.  He denies any resting shortness of breath, PND, orthopnea, or lower extremity edema.  He recently underwent arthroscopy of his right knee for partially torn meniscus.  He seems to be recovering uneventfully.   Patient returns to the office today to further discuss treatment options for management of symptomatic atrial fibrillation that has failed multiple drugs, cardioversion, and catheter-based ablation.  He was originally seen in consultation on January 12, 2020.  Subsequent to his previous visit the patient made a decision to proceed with further diagnostic testing and possible Maze procedure.  He underwent TEE and diagnostic cardiac catheterization on February 02, 2020.  TEE revealed normal left ventricular systolic function with ejection fraction estimated 55 to 60%.  There was mild mitral regurgitation and  mild aortic  insufficiency.  There was left atrial enlargement.  There was no thrombus seen in the left atrial appendage.  Diagnostic cardiac catheterization revealed patent stent in the mid left anterior descending coronary artery with no evidence for restenosis.  There was otherwise moderate nonobstructive coronary artery disease.  CT angiography with performed earlier today and the patient returns to our office for follow-up.  He reports no new problems or complaints over the past month.  He still complains that he seems to be in atrial fibrillation more often than he is and rhythm, and whenever he is out of rhythm he gets short of breath and tired much more easily.  He is having some palpitations.  He denies any chest pain.  The remainder of his review of systems is unchanged from previously.  Hospital Course: Patient was extubated without difficulties after the surgery. He was initially AAI paced and on Neo Synephrine drip. A line and foley were removed early in his post operative course. Chest tubes remained for a few days. He initially required several liters of oxygen but was later weaned to room air. He did wear CPAP at night for his OSA. He was weaned off the Insulin drip. His pre op HGA1C was 5.3. Accu checks and SS were stopped. He was in SR. He was restarted on Tikosyn 500 mcg bid. He had expected post op blood loss anemia. He was started on Trinsicon. His last H and H was 9.4 and 27.7. He was felt surgically stable for transfer from the ICU to 4E on 12/04. Epicardial pacing wires then chest tubes were remove don 12/05. He has been tolerating a diet. His wounds are clean, dry, and healing without signs of infection. His chest tubes and pacing wires were removed on 12/6. He remained in NSR and on IV Amiodarone.

## 2020-03-30 NOTE — Anesthesia Procedure Notes (Signed)
Arterial Line Insertion Start/End12/05/2019 6:57 AM, 03/30/2020 7:00 AM Performed by: Darral Dash, DO, Thelma Comp, CRNA, CRNA  Patient location: Pre-op. Preanesthetic checklist: patient identified, IV checked, site marked, risks and benefits discussed, surgical consent, monitors and equipment checked, pre-op evaluation, timeout performed and anesthesia consent Lidocaine 1% used for infiltration Left, radial was placed Catheter size: 20 Fr Hand hygiene performed , maximum sterile barriers used  and Seldinger technique used Allen's test indicative of satisfactory collateral circulation Attempts: 1 Procedure performed without using ultrasound guided technique. Following insertion, dressing applied and Biopatch. Post procedure assessment: normal and unchanged  Patient tolerated the procedure well with no immediate complications.

## 2020-03-30 NOTE — Anesthesia Procedure Notes (Signed)
Procedure Name: Intubation Date/Time: 03/30/2020 8:09 AM Performed by: Darletta Moll, CRNA Pre-anesthesia Checklist: Patient identified, Emergency Drugs available, Suction available and Patient being monitored Patient Re-evaluated:Patient Re-evaluated prior to induction Oxygen Delivery Method: Circle System Utilized Preoxygenation: Pre-oxygenation with 100% oxygen Induction Type: IV induction Ventilation: Mask ventilation without difficulty Laryngoscope Size: Mac and 4 Grade View: Grade II Tube type: Oral Endobronchial tube: Left, Double lumen EBT, EBT position confirmed by fiberoptic bronchoscope and EBT position confirmed by auscultation and 39 Fr Number of attempts: 2 Airway Equipment and Method: Stylet Placement Confirmation: ETT inserted through vocal cords under direct vision,  positive ETCO2 and breath sounds checked- equal and bilateral Secured at: 29 cm Tube secured with: Tape Dental Injury: Teeth and Oropharynx as per pre-operative assessment  Comments: DLx1 grade 3/4 view; no attempt at passing double lumen tube by Vickii Penna, CRNA; DLx1 grade 2 mac 4 successful intubation with DLT by S. Muqtasid, CRNA

## 2020-03-30 NOTE — Brief Op Note (Addendum)
03/30/2020  11:56 AM  PATIENT:  Jorge [name]  54 y.o. male  PRE-OPERATIVE DIAGNOSIS:  ATRIAL FIBRILLATION  POST-OPERATIVE DIAGNOSIS:  ATRIAL FIBRILLATION  PROCEDURE:  MINIMALLY INVASIVE MAZE PROCEDURE using BIPOLAR and COX CRYO , TRANSESOPHAGEAL ECHOCARDIOGRAM (TEE), CLIPPING OF ATRIAL APPENDAGE USING ATRICURE ATRICLIP [id number]  SURGEON:  Surgeon(s) and Role:    Rexene Alberts, MD - Primary  PHYSICIAN ASSISTANT: Lars Pinks PA-C  ANESTHESIA:   general  EBL:  Per perfusion and anesthesia record   DRAINS: Chest tubes placed in the right pleural spaces   COUNTS CORRECT:  YES  DICTATION: .Dragon Dictation  PLAN OF CARE: Admit to inpatient   PATIENT DISPOSITION:  ICU - intubated and hemodynamically stable.   Delay start of Pharmacological VTE agent (>24hrs) due to surgical blood loss or risk of bleeding: yes   BASELINE WEIGHT: 89.7 kg

## 2020-03-30 NOTE — Anesthesia Procedure Notes (Signed)
Central Venous Catheter Insertion Performed by: Darral Dash, DO, anesthesiologist Start/End12/05/2019 6:45 AM, 03/30/2020 6:54 AM Patient location: Pre-op. Preanesthetic checklist: patient identified, IV checked, site marked, risks and benefits discussed, surgical consent, monitors and equipment checked, pre-op evaluation, timeout performed and anesthesia consent Position: Trendelenburg Lidocaine 1% used for infiltration and patient sedated Hand hygiene performed , maximum sterile barriers used  and Seldinger technique used Catheter size: 9 Fr Central line was placed.MAC introducer Procedure performed using ultrasound guided technique. Ultrasound Notes:anatomy identified, needle tip was noted to be adjacent to the nerve/plexus identified, no ultrasound evidence of intravascular and/or intraneural injection and image(s) printed for medical record Attempts: 1 Following insertion, line sutured, dressing applied and Biopatch. Post procedure assessment: blood return through all ports, free fluid flow and no air  Patient tolerated the procedure well with no immediate complications.

## 2020-03-30 NOTE — Op Note (Signed)
CARDIOTHORACIC SURGERY OPERATIVE NOTE  Date of Procedure:   03/30/2020  Preoperative Diagnosis: Recurrent Paroxysmal Atrial Fibrillation  Postoperative Diagnosis: Same  Procedure:    Minimally-Invasive Maze Procedure  Complete bilateral atrial lesion set using cryothermy and bipolar radiofrequency ablation  Clipping of Left Atrial Appendage (Atricure Pro245 left atrial clip, size 45 mm)  Surgeon: Valentina Gu. Roxy Manns, MD  Assistant: Nani Skillern, PA-C  Anesthesia: Rochele Pages, DO  Operative Findings:  Mild-moderate left atrial enlargement  Normal left ventricular systolic function                 BRIEF CLINICAL NOTE AND INDICATIONS FOR SURGERY  Patient is a 54 year old male with history of coronary artery disease status post PCI and stenting of the left anterior descending coronary artery in 2019, hypertension, obstructive sleep apnea on CPAP, hyperlipidemia, and chronic recurrent paroxysmal atrial fibrillation on long-term anticoagulation who has been referred for surgical consultation to discuss treatment options for management of symptomatic atrial fibrillation that has failed multiple drugs,cardioversion, and catheter-based ablation.  Patient states that he first began to have intermittent symptoms of tachypalpitations more than 20 years ago. He was first diagnosed with atrial fibrillation in 2011. He has failed multiple attempts at cardioversion and pharmacological therapy with Multaq, flecainide, amiodarone, and most recently Tikosyn. He underwent catheter-based ablation in July 2015 and repeat ablation in December 2015. He has been followed ever since by Dr. Rayann Heman. Nuclear stress test in 2018 revealed normal perfusion but diagnostic cardiac catheterization performed August 2019 revealed high-grade stenosis of the mid left anterior descending coronary artery with mild to moderate disease in the right coronary artery and left circumflex  territories. He underwent PCI and stenting of left anterior descending coronary artery by Dr. Angelena Form at that time. He underwent sleep study which did reveal findings consistent with sleep apnea and he now wears CPAP at night. Since catheter-based ablation he has had less symptoms of palpitations but he continues to have frequent intermittent episodes of exertional shortness of breath and fatigue,decreased exercise tolerance, intermittent chest tightness, and occasional palpitations. He monitors his pulse and heart rate regularly and frequently has heart rate elevated as high as 170 to 180 bpm. He has had difficulty with medications for rate control including intolerance of metoprolol. Follow-up exercise treadmill performed March of this year was felt to be low risk for ischemia. Implantable loop recorder was placed in May of this year and has documented that the patient'satrial fibrillation burden recently has remained as high as 70%. He remains chronically anticoagulated using Xarelto and he has not had any bleeding complications nor history of TIA or stroke. Surgical consultation was requested to consider Maze procedure.  The patient has been seen in consultation and counseled at length regarding the indications, risks and potential benefits of surgery.  All questions have been answered, and the patient provides full informed consent for the operation as described.    DETAILS OF THE OPERATIVE PROCEDURE  Preparation:  The patient is brought to the operating room on the above mentioned date and central monitoring was established by the anesthesia team including placement of central venous catheter through the left internal jugular vein.  A radial arterial line is placed. The patient is placed in the supine position on the operating table.  Intravenous antibiotics are administered. General endotracheal anesthesia is induced uneventfully. The patient is initially intubated using a dual lumen  endotracheal tube.  A Foley catheter is placed.  Baseline transesophageal echocardiogram was performed.  Findings were notable for  normal left ventricular size and systolic function.  There was trace to mild central mitral regurgitation.  There was mild to moderate left atrial enlargement.  The aortic valve was normal.  Right ventricular size and function was normal.  A soft roll is placed behind the patient's left scapula and the neck gently extended and turned to the left.   The patient's right neck, chest, abdomen, both groins, and both lower extremities are prepared and draped in a sterile manner. A time out procedure is performed.   Percutaneous Vascular Access:  Percutaneous arterial and venous access were obtained on the right side.  Using ultrasound guidance the right common femoral vein was cannulated using the Seldinger technique and a pair of Perclose vascular closure devises were placed at opposing 30 degree angles in the femoral vein, after which time an 8 French sheath inserted.  The right common femoral artery was cannulated using a micropuncture wire and sheath.  A pair of Perclose vascular closure devices were placed at opposing 30 degree angles in the femoral artery, and a 8 French sheath inserted.  The right internal jugular vein was cannulated  using ultrasound guidance and an 8 French sheath inserted.     Surgical Approach:  A right miniature anterolateral thoracotomy incision is performed. The incision is placed just lateral to and superior to the right nipple. The pectoralis major muscle is retracted medially and completely preserved. The right pleural space is entered through the 3rd intercostal space. A soft tissue retractor is placed.  Two 11 mm ports are placed through separate stab incisions inferiorly. The right pleural space is insufflated continuously with carbon dioxide gas through the posterior port during the remainder of the operation.  A pledgeted sutures placed  through the dome of the right hemidiaphragm and retracted inferiorly to facilitate exposure.  A longitudinal incision is made in the pericardium 3 cm anterior to the phrenic nerve and silk traction sutures are placed on either side of the incision for exposure.   Extracorporeal Cardiopulmonary Bypass and Myocardial Protection:   The patient was heparinized systemically.  The right common femoral vein is cannulated through the venous sheath and a guidewire advanced into the right atrium using TEE guidance.  The femoral vein cannulated using a 22 Fr long femoral venous cannula.  The right common femoral artery is cannulated through the arterial sheath and a guidewire advanced into the descending thoracic aorta using TEE guidance.  Femoral artery is cannulated with a 18 French femoral arterial cannula.  The right internal jugular vein is cannulated through the venous sheath and a guidewire advanced into the right atrium.  The internal jugular vein is cannulated using a 14 French pediatric femoral venous cannula.   Adequate heparinization is verified.   The entire pre-bypass portion of the operation was notable for stable hemodynamics.  Cardiopulmonary bypass was begun.  Vacuum assist venous drainage is utilized. The incision in the pericardium is extended in both directions. Venous drainage and exposure are notably excellent.    Clipping of Left Atrial Appendage:  The left atrial appendage is obliterated using an Atricure left atrial appendage clip (Atriclip Pro245, size 60mm).  The clip is applied under thoracoscopic visualization posterior to the aorta and pulmonary artery through the oblique sinus.  The clip was applied prior to application of the aortic crossclamp, with transesophageal echocardiographic confirmation that the clip satisfactorily obliterates the appendage.   Myocardial Protection:  An antegrade cardioplegia cannula is placed in the ascending aorta.  The patient is allowed  to cool  passively to 32C systemic temperature.  The aortic cross clamp is applied and cardioplegia is delivered initially in an antegrade fashion through the aortic root using modified [place] cold blood cardioplegia ([facility] blood cardioplegia protocol).   The initial cardioplegic arrest is rapid with early diastolic arrest.  Myocardial protection was felt to be excellent.   Maze Procedure (left atrial lesion set):  Following placement of the aortic crossclamp and the administration of the initial arresting dose of cardioplegia, Waterston's groove is dissected.  A left atriotomy incision was performed through the interatrial groove and extended partially across the back wall of the left atrium after opening the oblique sinus inferiorly.  The mitral valve and floor of the left atrium are exposed using a self-retaining retractor.    The Atricure CryoICE nitrous oxide cryothermy system is utilized for all cryothermy ablation lesions using 3 minute duration.  The AtriCure Synergy bipolar radiofrequency ablation clamp is utilized for all bipolar radiofrequency ablation lesions.  The left atrial lesion set of the Cox maze IV procedure is performed.  The entire left atrial lesion set is performed using cryothermy.  A cryothermy lesion is placed along the endocardial surface of the left atrium from the caudad apex of the atriotomy incision across the posterior wall of the left atrium onto the posterior mitral annulus.  A mirror image lesion along the epicardial surface is then performed with the probe posterior to the left atrium, crossing over the coronary sinus.  Two lesions are then performed to create a box isolating all of the pulmonary veins from the remainder of the left atrium.  The first lesion is placed from the cephalad apex of the atriotomy incision across the dome of the left atrium to just anterior to the left sided pulmonary veins.  This lesion connects to the base of the left atrial appendage.  The  second lesion completes the box from the caudad apex of the atriotomy incision across the back wall of the left atrium to connect with the previous lesion just anterior to the left sided pulmonary veins.     Maze Procedure (right atrial lesion set):  A small incision is made in the posterior wall of the right atrium immediately posterior to the fossa ovalis.  The incision is extended a short distance anteriorly along the lateral wall of the right atrium towards the acute margin.  The AtriCure Synergy bipolar radiofrequency ablation clamp is utilized to create a series of linear lesions in the right atrium.  The first lesion is performed from the posterolateral apex of the right atriotomy incision across the posterior right atrium in a cephalad direction onto the posterior superior vena cava.  This lesion is performed with one limb of the bipolar clamp along the endocardial surface of the right atrium and the other across the epicardial surface and into the left atrium to completely avoid the sinus node activation complex.  A second lesion is placed in the opposite direction from the posterior apex of the right atriotomy incision along the posterolateral wall to reach the lateral aspect of the inferior vena cava. A third lesion is placed from anterior apex of the right atriotomy incision in an oblique orientation towards the acute margin of the heart, with care to stay inferior and well away from the sinus node activation complex region.  The left atriotomy was closed using a 2-layer closure of running 3-0 Prolene suture after placing a sump drain across the mitral valve to serve as a left  ventricular vent.  One final dose of warm "reanimation dose" cardioplegia was administered through the aortic root.  The aortic cross clamp was removed after a total cross clamp time of 56 minutes.  The posterolateral right atriotomy incision is closed with a 2-layer closure of running 3-0 Prolen suture.    A second small  right atriotomy incision is made at the anterior end of the bipolar lesion close to the acute margin.  A fourth bipolar lesion is placed from the anterior apex of this atriotomy incision in an anterior direction to the tip of the right atrial appendage.   Finally, the cryotherapy probe is utilized to complete the right atrial lesion set by placing the probe along the endocardial surface of the right atrium from the anterior apex of the atriotomy incision to cross the acute margin and reach the tricuspid annulus at the 2:00 position. The second right atriotomy incision is closed with a 2 layer closure of running 4-0 Prolene suture.   Procedure Completion:  Epicardial pacing wires are fixed to the inferior wall of the right ventricule and to the right atrial appendage. The patient is rewarmed to 37C temperature. The left ventricular vent and antegrade cardioplegia cannula are removed. The patient is weaned and disconnected from cardiopulmonary bypass.  The patient's rhythm at separation from bypass was atrial paced.  The patient was weaned from bypass without any inotropic support. Total cardiopulmonary bypass time for the operation was 108 minutes.  Followup transesophageal echocardiogram performed after separation from bypass revealed no significant changes from preoperatively.  Left ventricular function was normal.  The femoral arterial and venous cannulas were removed and all Perclose sutures secured.  Manual pressure was maintained while Protamine was administered.  The right internal jugular cannula was removed and manual pressure held on the neck and groin for 15 minutes.  Single lung ventilation was begun. The atriotomy closure was inspected for hemostasis. The pericardial sac was drained using a 28 French Bard drain placed through the anterior port incision.  The right pleural space is irrigated with saline solution and inspected for hemostasis.   A mixture of Exparel liposomal bupivacaine (20 mL)  and 0.5% bupivacaine (30 mL) is utilized to create an intercostal nerve block for postoperative analgesia.  The mixture is injected under direct vision into the intercostal neurovascular bundles posteriorly to cover the second through the sixth intercostal nerve roots.  Portions of the solution are also injected into the intercostal neurovascular bundles immediately surrounding the surgical incision and immediately adjacent to the chest tube exit sites.  The right pleural space was drained using a 28 French Bard drain placed through the posterior port incision. The miniature thoracotomy incision was closed in multiple layers in routine fashion. The right groin incision was inspected for hemostasis and closed in multiple layers in routine fashion.  The post-bypass portion of the operation was notable for stable rhythm and hemodynamics.  No blood products were administered during the operation.   Disposition:  The patient tolerated the procedure well.  The patient was extubated in the operating room and subsequently transported to the surgical intensive care unit in stable condition. There were no intraoperative complications. All sponge instrument and needle counts are verified correct at completion of the operation.    Valentina Gu. Roxy Manns MD 03/30/2020 12:34 PM

## 2020-03-31 ENCOUNTER — Encounter (HOSPITAL_COMMUNITY): Payer: Self-pay | Admitting: Thoracic Surgery (Cardiothoracic Vascular Surgery)

## 2020-03-31 ENCOUNTER — Inpatient Hospital Stay (HOSPITAL_COMMUNITY): Payer: BC Managed Care – PPO

## 2020-03-31 LAB — CBC
HCT: 28.4 % — ABNORMAL LOW (ref 39.0–52.0)
HCT: 29.3 % — ABNORMAL LOW (ref 39.0–52.0)
Hemoglobin: 10 g/dL — ABNORMAL LOW (ref 13.0–17.0)
Hemoglobin: 9.7 g/dL — ABNORMAL LOW (ref 13.0–17.0)
MCH: 31.1 pg (ref 26.0–34.0)
MCH: 29.9 pg (ref 26.0–34.0)
MCHC: 35.2 g/dL (ref 30.0–36.0)
MCHC: 33.1 g/dL (ref 30.0–36.0)
MCV: 88.2 fL (ref 80.0–100.0)
MCV: 90.4 fL (ref 80.0–100.0)
Platelets: 183 10*3/uL (ref 150–400)
Platelets: 134 10*3/uL — ABNORMAL LOW (ref 150–400)
RBC: 3.22 MIL/uL — ABNORMAL LOW (ref 4.22–5.81)
RBC: 3.24 MIL/uL — ABNORMAL LOW (ref 4.22–5.81)
RDW: 12.5 % (ref 11.5–15.5)
RDW: 12.5 % (ref 11.5–15.5)
WBC: 11.9 10*3/uL — ABNORMAL HIGH (ref 4.0–10.5)
WBC: 14.3 10*3/uL — ABNORMAL HIGH (ref 4.0–10.5)
nRBC: 0 % (ref 0.0–0.2)
nRBC: 0 % (ref 0.0–0.2)

## 2020-03-31 LAB — GLUCOSE, CAPILLARY
Glucose-Capillary: 105 mg/dL — ABNORMAL HIGH (ref 70–99)
Glucose-Capillary: 106 mg/dL — ABNORMAL HIGH (ref 70–99)
Glucose-Capillary: 107 mg/dL — ABNORMAL HIGH (ref 70–99)
Glucose-Capillary: 108 mg/dL — ABNORMAL HIGH (ref 70–99)
Glucose-Capillary: 109 mg/dL — ABNORMAL HIGH (ref 70–99)
Glucose-Capillary: 113 mg/dL — ABNORMAL HIGH (ref 70–99)
Glucose-Capillary: 120 mg/dL — ABNORMAL HIGH (ref 70–99)
Glucose-Capillary: 95 mg/dL (ref 70–99)
Glucose-Capillary: 97 mg/dL (ref 70–99)
Glucose-Capillary: 98 mg/dL (ref 70–99)

## 2020-03-31 LAB — BASIC METABOLIC PANEL
Anion gap: 10 (ref 5–15)
Anion gap: 7 (ref 5–15)
BUN: 18 mg/dL (ref 6–20)
BUN: 21 mg/dL — ABNORMAL HIGH (ref 6–20)
CO2: 19 mmol/L — ABNORMAL LOW (ref 22–32)
CO2: 22 mmol/L (ref 22–32)
Calcium: 7.5 mg/dL — ABNORMAL LOW (ref 8.9–10.3)
Calcium: 7.8 mg/dL — ABNORMAL LOW (ref 8.9–10.3)
Chloride: 106 mmol/L (ref 98–111)
Chloride: 104 mmol/L (ref 98–111)
Creatinine, Ser: 0.44 mg/dL — ABNORMAL LOW (ref 0.61–1.24)
Creatinine, Ser: 0.79 mg/dL (ref 0.61–1.24)
GFR, Estimated: >60 mL/min (ref >60)
GFR, Estimated: >60 mL/min (ref >60)
Glucose, Bld: 114 mg/dL — ABNORMAL HIGH (ref 70–99)
Glucose, Bld: 134 mg/dL — ABNORMAL HIGH (ref 70–99)
Potassium: 4 mmol/L (ref 3.5–5.1)
Potassium: 3.9 mmol/L (ref 3.5–5.1)
Sodium: 135 mmol/L (ref 135–145)
Sodium: 133 mmol/L — ABNORMAL LOW (ref 135–145)

## 2020-03-31 LAB — MAGNESIUM
Magnesium: 2.4 mg/dL (ref 1.7–2.4)
Magnesium: 2 mg/dL (ref 1.7–2.4)

## 2020-03-31 MED ORDER — INSULIN ASPART 100 UNIT/ML ~~LOC~~ SOLN: 0.0000 [IU] | SUBCUTANEOUS | Status: DC

## 2020-03-31 MED ORDER — CYCLOBENZAPRINE HCL 10 MG PO TABS
10.0000 mg | ORAL_TABLET | Freq: Once | ORAL | Status: AC
  Administered 2020-03-31: 10 mg via ORAL
  Filled 2020-03-31: qty 1

## 2020-03-31 MED ORDER — ASPIRIN EC 81 MG PO TBEC
81.0000 mg | DELAYED_RELEASE_TABLET | Freq: Every day | ORAL | Status: DC
  Administered 2020-04-01 – 2020-04-04 (×4): 81 mg via ORAL
  Filled 2020-03-31 (×4): qty 1

## 2020-03-31 MED ORDER — ASPIRIN EC 325 MG PO TBEC
325.0000 mg | DELAYED_RELEASE_TABLET | Freq: Every day | ORAL | Status: AC
  Administered 2020-03-31: 325 mg via ORAL
  Filled 2020-03-31: qty 1

## 2020-03-31 MED ORDER — MONTELUKAST SODIUM 10 MG PO TABS
10.0000 mg | ORAL_TABLET | Freq: Every day | ORAL | Status: DC
  Administered 2020-03-31 – 2020-04-03 (×4): 10 mg via ORAL
  Filled 2020-03-31 (×4): qty 1

## 2020-03-31 MED ORDER — FUROSEMIDE 10 MG/ML IJ SOLN
20.0000 mg | Freq: Once | INTRAMUSCULAR | Status: AC
  Administered 2020-03-31: 20 mg via INTRAVENOUS
  Filled 2020-03-31: qty 2

## 2020-03-31 MED ORDER — POTASSIUM CHLORIDE 10 MEQ/50ML IV SOLN
10.0000 meq | INTRAVENOUS | Status: AC
  Administered 2020-03-31 (×2): 10 meq via INTRAVENOUS
  Filled 2020-03-31 (×2): qty 50

## 2020-03-31 MED ORDER — ROSUVASTATIN CALCIUM 20 MG PO TABS
40.0000 mg | ORAL_TABLET | Freq: Every evening | ORAL | Status: DC
  Administered 2020-04-02 – 2020-04-03 (×2): 40 mg via ORAL
  Filled 2020-03-31 (×2): qty 2

## 2020-03-31 MED ORDER — FENTANYL 50 MCG/HR TD PT72
1.0000 | MEDICATED_PATCH | Freq: Once | TRANSDERMAL | Status: AC
  Administered 2020-03-31: 1 via TRANSDERMAL
  Filled 2020-03-31: qty 1

## 2020-03-31 MED ORDER — ENOXAPARIN SODIUM 40 MG/0.4ML ~~LOC~~ SOLN
40.0000 mg | Freq: Every day | SUBCUTANEOUS | Status: DC
  Administered 2020-04-01 – 2020-04-03 (×3): 40 mg via SUBCUTANEOUS
  Filled 2020-03-31 (×3): qty 0.4

## 2020-03-31 MED FILL — Potassium Chloride Inj 2 mEq/ML: INTRAVENOUS | Qty: 40 | Status: AC

## 2020-03-31 MED FILL — Lidocaine HCl Local Preservative Free (PF) Inj 2%: INTRAMUSCULAR | Qty: 15 | Status: AC

## 2020-03-31 MED FILL — Heparin Sodium (Porcine) Inj 1000 Unit/ML: INTRAMUSCULAR | Qty: 30 | Status: AC

## 2020-03-31 NOTE — Progress Notes (Signed)
Carelink Summary Report / Loop Recorder 

## 2020-03-31 NOTE — Discharge Summary (Signed)
Physician Discharge Summary       [address]       [place] [address]             [phone]    Patient ID: Jorge [name] MRN: [id number] DOB/AGE: 1965-05-08 54 y.o.  Admit date: 03/30/2020 Discharge date: 04/04/2020  Admission Diagnoses: Recurrent paroxysmal atrial fibrillation [facility])  Discharge Diagnoses:  1.S/P minimally-invasive maze operation for atrial fibrillation 2. 3. History of essential hypertension 4. History of OSA (obstructive sleep apnea) 5. History of coronary artery disease involving native coronary artery of native heart without angina pectoris 6. History of hyperlipidemia 7. History of bilateral nephrolithiasis 8. History of placement of implantable loop recorder 9. History of ablation of atrial fibrillation x 3 10. History of OA (osteoarthritis)   Consults: None  Procedure (s):   Minimally-Invasive Maze Procedure             Complete bilateral atrial lesion set using cryothermy and bipolar radiofrequency ablation             Clipping of Left Atrial Appendage (Atricure Pro245 left atrial clip, size 45 mm) by Dr. Roxy Manns on 03/31/2020.  History of Presenting Illness: Patient is a 54 year old male with history of coronary artery disease status post PCI and stenting of the left anterior descending coronary artery in 2019, hypertension, obstructive sleep apnea on CPAP, hyperlipidemia, and chronic recurrent paroxysmal atrial fibrillation on long-term anticoagulation who has been referred for surgical consultation to discuss treatment options for management of symptomatic atrial fibrillation that has failed multiple drugs, cardioversion, and catheter-based ablation.   Patient states that he first began to have intermittent symptoms of tachypalpitations more than 20 years ago.  He was first diagnosed with atrial fibrillation in 2011.  He has failed multiple attempts at cardioversion and pharmacological therapy with Multaq, flecainide,  amiodarone, and most recently Tikosyn.  He underwent catheter-based ablation in July 2015 and repeat ablation in December 2015.  He has been followed ever since by Dr. Rayann Heman.  Nuclear stress test in 2018 revealed normal perfusion but diagnostic cardiac catheterization performed August 2019 revealed high-grade stenosis of the mid left anterior descending coronary artery with mild to moderate disease in the right coronary artery and left circumflex territories.  He underwent PCI and stenting of left anterior descending coronary artery by Dr. Angelena Form at that time.  He underwent sleep study which did reveal findings consistent with sleep apnea and he now wears CPAP at night.  Since catheter-based ablation he has had less symptoms of palpitations but he continues to have frequent intermittent episodes of exertional shortness of breath and fatigue, decreased exercise tolerance, intermittent chest tightness, and occasional palpitations.  He monitors his pulse and heart rate regularly and frequently has heart rate elevated as high as 170 to 180 bpm. He has had difficulty with medications for rate control including intolerance of metoprolol.  Follow-up exercise treadmill performed March of this year was felt to be low risk for ischemia.  Implantable loop recorder was placed in May of this year and has documented that the patient's atrial fibrillation burden recently has remained as high as 70%.  He remains chronically anticoagulated using Xarelto and he has not had any bleeding complications nor history of TIA or stroke.  Surgical consultation was requested to consider Maze procedure.   Patient is married and lives locally in [place] with his wife.  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  He has remained reasonably active physically although he complains that he has had to cut back considerably on activity over the last few  years.  He complains of exertional shortness of breath and fatigue which limits his lifestyle considerably.  He reports occasional episodes of tightness across his chest that seem to correlate with periods where his heart rate is running fast.  He has occasional palpitations and occasional dizzy spells without syncope.  He denies any resting shortness of breath, PND, orthopnea, or lower extremity edema.  He recently underwent arthroscopy of his right knee for partially torn meniscus.  He seems to be recovering uneventfully.   Diagnostic cardiac catheterization revealed patent stent in the mid left anterior descending coronary artery with no evidence for restenosis.  There was otherwise moderate nonobstructive coronary artery disease.  CT angiography with performed earlier today and the patient returns to our office for follow-up.  He reports no new problems or complaints over the past month.  He still complains that he seems to be in atrial fibrillation more often than he is and rhythm, and whenever he is out of rhythm he gets short of breath and tired much more easily.  He is having some palpitations.  He denies any chest pain.  The remainder of his review of systems is unchanged from previously.  Dr. Emmit Alexanders the natural history of atrial fibrillation at length with the patientand his wifein the office today. We discussed the differences between primary lone atrial fibrillation and atrial fibrillation associated with other cardiac or respiratory problems. They discussed treatment strategies including rate control with long-term anticoagulation, pharmacologic therapy, catheter-based therapies, and a variety of surgical options. They discussed the rationale for surgical Maze procedure andour local experience for patients both with and without need for concomitant surgical procedures. They discussed alternative surgical approaches including both conventional sternotomy and minithoracotomy  approach for a full Maze procedure versus other less invasive surgical approaches without use of cardiopulmonary bypass such as the convergent procedure. Patient wished to proceed with minimally invasive MAZE and LA clip and this was done on 03/30/2020.    Hospital Course: Patient was extubated without difficulties after the surgery. He was initially AAI paced and on Neo Synephrine drip. A line and foley were removed early in his post operative course. Chest tubes remained for a few days. He initially required several liters of oxygen but was later weaned to room air. He did wear CPAP at night for his OSA. He was weaned off the Insulin drip. His pre op HGA1C was 5.3. Accu checks and SS were stopped. He was in SR. He was restarted on Tikosyn 500 mcg bid.  And subsequently been discontinued.  He had expected post op blood loss anemia. He was started on Trinsicon. His last H and H was 9.4 and 27.7. He was felt surgically stable for transfer from the ICU to 4E on 12/04. Epicardial pacing wires then chest tubes were remove don 12/05. He has been tolerating a diet. His wounds are clean, dry, and healing without signs of infection. His chest tubes and pacing wires were removed on 12/6. He remained in NSR and on IV Amiodarone.  His been converted to p.o. dosing at time of discharge.  Oxygen has been weaned and maintains good saturations on room air.  He is tolerating gradually increasing activities using standard protocols.   Latest Vital Signs: Blood pressure 117/65, pulse 83, temperature 98.5 F (36.9 C), temperature source Oral, resp. rate 20, height 5'  8" (1.727 m), weight 90.1 kg, SpO2 97 %.  Physical Exam: General appearance: alert, cooperative and no distress Heart: regular rate and rhythm, S1, S2 normal, no murmur, click, rub or gallop Lungs: clear to auscultation bilaterally Abdomen: soft, non-tender; bowel sounds normal; no masses,  no organomegaly Extremities: extremities normal, atraumatic, no  cyanosis or edema Wound: clean and dry   Discharge Condition: Stable and discharged to home.  Recent laboratory studies:  Lab Results  Component Value Date   WBC 13.3 (H) 04/02/2020   HGB 9.4 (L) 04/02/2020   HCT 27.7 (L) 04/02/2020   MCV 89.4 04/02/2020   PLT 187 04/02/2020   Lab Results  Component Value Date   NA 133 (L) 04/04/2020   K 4.3 04/04/2020   CL 97 (L) 04/04/2020   CO2 28 04/04/2020   CREATININE 0.65 04/04/2020   GLUCOSE 125 (H) 04/04/2020    Diagnostic Studies: DG Chest 2 View  Result Date: 04/04/2020 CLINICAL DATA:  Maze procedure.  Atelectasis. EXAM: CHEST - 2 VIEW COMPARISON:  04/03/2020. FINDINGS: Interim removal of right chest tube. Very tiny right apical and right base pneumothorax is again noted. Mild right mid lung and right base subsegmental atelectasis/infiltrates. Mild left base subsegmental atelectasis. Tiny right pleural effusion. Cardiac monitoring device noted over the chest. Left atrial appendage clip in stable position. Stable cardiomegaly. No pulmonary venous congestion. Degenerative change thoracic spine. IMPRESSION: 1. Interim removal of right chest tube. Very tiny right apical and right base pneumothorax is again noted. No interim change. 2. Mild right mid lung and right base subsegmental atelectasis/infiltrates. Mild left base subsegmental atelectasis. Electronically Signed   By: Marcello Moores  Register   On: 04/04/2020 07:59   DG Chest 2 View  Result Date: 04/03/2020 CLINICAL DATA:  Pneumothorax. EXAM: CHEST - 2 VIEW COMPARISON:  April 01, 2020. FINDINGS: Similar position of a right chest tubes. Small right apical pneumothorax. No visible pleural effusions. Similar right greater than left linear bibasilar opacities, compatible with atelectasis. Similar cardiomediastinal silhouette. Loop recorder projects over the left chest. Aortic atherosclerosis. No acute osseous abnormality. IMPRESSION: 1. Small right apical pneumothorax with similar positioning of  right-sided chest tubes. 2. Similar bibasilar atelectasis. Electronically Signed   By: Margaretha Sheffield MD   On: 04/03/2020 08:13   DG Chest 2 View  Result Date: 03/28/2020 CLINICAL DATA:  54 year old male with pre admission chest radiograph EXAM: CHEST - 2 VIEW COMPARISON:  Chest CT dated 02/07/2020. FINDINGS: No focal consolidation, pleural effusion, pneumothorax. The cardiac silhouette is within limits. No acute osseous pathology. A loop recorder device noted. IMPRESSION: No active cardiopulmonary disease. Electronically Signed   By: Anner Crete M.D.   On: 03/28/2020 00:00   DG Chest Port 1 View  Result Date: 04/01/2020 CLINICAL DATA:  Atelectasis.  Follow-up study. EXAM: PORTABLE CHEST 1 VIEW COMPARISON:  03/31/2020 and prior exams. FINDINGS: Persistent lung base opacity is noted consistent with atelectasis. Remainder of the lungs is clear. No convincing pleural effusion and no pneumothorax. Right-sided chest tubes are stable as is the left internal jugular dual lumen central venous catheter. Cardiac silhouette is normal in size.  No mediastinal widening. IMPRESSION: 1. No significant change from the previous day's study. 2. No pneumothorax.  Persistent lung base atelectasis. Electronically Signed   By: Lajean Manes M.D.   On: 04/01/2020 09:36   DG Chest Port 1 View  Result Date: 03/31/2020 CLINICAL DATA:  Post open heart surgery, chest tube EXAM: PORTABLE CHEST 1 VIEW COMPARISON:  Portable exam 6734  hours compared to 03/30/2020 FINDINGS: RIGHT thoracostomy tubes unchanged. Epicardial pacing wires and loop recorder noted. LEFT jugular central venous catheter tip projecting over LEFT brachiocephalic vein confluence. Enlargement of cardiac silhouette. Mediastinal contours and pulmonary vascularity normal. Bibasilar atelectasis. No acute infiltrate, pleural effusion or pneumothorax. IMPRESSION: Bibasilar atelectasis. Electronically Signed   By: Lavonia Dana M.D.   On: 03/31/2020 08:02   DG  Chest Port 1 View  Result Date: 03/30/2020 CLINICAL DATA:  Status post left atrial appendage clamp placement. Atelectasis. EXAM: PORTABLE CHEST 1 VIEW COMPARISON:  March 27, 2020. FINDINGS: There is a chest tube on the right. There is an apparent mediastinal drain. Left jugular catheter tip is at the junction of the left jugular and left innominate veins. There is subcutaneous air on the right. No pneumothorax evident. Temporary pacemaker wires are attached to the right heart. There is a left atrial appendage clamp present. There is a loop recorder on the left. There is atelectatic change in the bases, more on the right than on the left. No edema or airspace opacity. Heart is upper normal in size with pulmonary vascularity normal. No adenopathy. No bone lesions. IMPRESSION: Tube and catheter positions as described. Subcutaneous air noted on the right but no pneumothorax. Postoperative changes noted. Heart upper normal in size. Right base atelectasis with small amount of left base atelectasis. No edema or airspace opacity. Electronically Signed   By: Lowella Grip III M.D.   On: 03/30/2020 13:47   CUP PACEART REMOTE DEVICE CHECK  Result Date: 03/27/2020 ILR summary report received. Battery status OK. Normal device function. No new symptom, tachy, brady, or pause episodes. AF burden 79.5%; longest 83 hours; available ECGs suggest AF; histogram shows ~60% binned >100 bpm; history of AF on rivaroxaban. Monthly summary reports and ROV/PRN. HB  ECHO INTRAOPERATIVE TEE  Result Date: 03/30/2020  *INTRAOPERATIVE TRANSESOPHAGEAL REPORT *  Patient Name:   Jorge [name] Date of Exam: 03/30/2020 Medical Rec #:  [id number]          Height:       68.0 in Accession #:    [id number]         Weight:       197.8 lb Date of Birth:  1965/10/06          BSA:          2.03 m Patient Age:    12 years           BP:           143/87 mmHg Patient Gender: M                  HR:           66 bpm. Exam Location:   Anesthesiology Transesophogeal exam was perform intraoperatively during surgical procedure. Patient was closely monitored under general anesthesia during the entirety of examination. Indications:     Atrial Fibrillation Sonographer:     Vickie Epley RDCS Performing Phys: [place] Diagnosing Phys: Belenda Cruise Stoltzfus Complications: No known complications during this procedure. POST-OP IMPRESSIONS - Left Ventricle: The left ventricle is unchanged from pre-bypass. - Left Atrial Appendage: A is completely occluded Atriclip was placed. - Aortic Valve: The aortic valve appears unchanged from pre-bypass. - Mitral Valve: The mitral valve appears unchanged from pre-bypass. - Tricuspid Valve: The tricuspid valve appears unchanged from pre-bypass. - Interatrial Septum: The interatrial septum appears unchanged from pre-bypass. - Pericardium: The pericardium appears unchanged from pre-bypass. S/P MAZE. Emergence  from CPB on minimal phenylephrine support. No new or worsening wall motion or valvular abnormalities. EF post procedure unchanged 50-55%. Mild central secondary MR 2/2 atrial dilation unchanged. Atriclip to LAA applied positioned properly without flow on interrogation. PRE-OP FINDINGS  Left Ventricle: The left ventricle has low normal systolic function, with an ejection fraction of 50-55%. The cavity size was normal. There is no increase in left ventricular wall thickness. No evidence of left ventricular regional wall motion abnormalities. Right Ventricle: The right ventricle has normal systolic function. The cavity was normal. There is no increase in right ventricular wall thickness. Right ventricular systolic pressure is normal. Left Atrium: Left atrial size was dilated. The left atrial appendage is well visualized and there is no evidence of thrombus present. Left atrial appendage velocity is normal at greater than 40 cm/s. Right Atrium: Right atrial size was normal in size. Interatrial Septum: No atrial  level shunt detected by color flow Doppler. Pericardium: There is no evidence of pericardial effusion. There is no pleural effusion. Mitral Valve: The mitral valve is dilated. No thickening of the mitral valve leaflet. No calcification of the mitral valve leaflet. Mitral valve regurgitation is mild by color flow Doppler. The MR jet is centrally-directed. There is no evidence of mitral  valve vegetation. Pulmonary venous flow is normal. There is No evidence of mitral stenosis. Tricuspid Valve: The tricuspid valve was normal in structure. Tricuspid valve regurgitation was not visualized by color flow Doppler. There is no evidence of tricuspid valve vegetation. Aortic Valve: The aortic valve is tricuspid Aortic valve regurgitation is trivial by color flow Doppler. There is no evidence of aortic valve stenosis. There is no evidence of a vegetation on the aortic valve. Pulmonic Valve: The pulmonic valve was normal in structure No evidence of pumonic stenosis. Pulmonic valve regurgitation is not visualized by color flow Doppler. Aorta: The aortic root and ascending aorta are normal in size and structure. There is evidence of layered immobile plaque in the descending aorta; Grade I, measuring 1-39mm in size. Pulmonary Artery: The pulmonary artery is of normal size. Venous: The inferior vena cava is normal in size with greater than 50% respiratory variability, suggesting right atrial pressure of 3 mmHg. +-------------+--------++ AORTIC VALVE          +-------------+--------++ AV Mean Grad:1.0 mmHg +-------------+--------++ +-------------+--------++ MITRAL VALVE          +-------------+--------++ MV Mean grad:1.0 mmHg +-------------+--------++  Rochele Pages Electronically signed by Rochele Pages Signature Date/Time: 03/30/2020/2:56:13 PM    Final    VAS US DOPPLER PRE CABG  Result Date: 03/27/2020 PREOPERATIVE VASCULAR EVALUATION  Indications:      Pre-op Mini Maze procedure. Risk Factors:      Hypertension, hyperlipidemia. Other Factors:    Paroxysmal atrial fibrillation, failed multiple                   cardioversions. Limitations:      Rapid atrial fibrillation Comparison Study: No prior study Performing Technologist: Sharion Dove RVS  Examination Guidelines: A complete evaluation includes B-mode imaging, spectral Doppler, color Doppler, and power Doppler as needed of all accessible portions of each vessel. Bilateral testing is considered an integral part of a complete examination. Limited examinations for reoccurring indications may be performed as noted.  Right Carotid Findings: +----------+--------+--------+--------+--------+------------------+           PSV cm/sEDV cm/sStenosisDescribeComments           +----------+--------+--------+--------+--------+------------------+ CCA Prox  75      17  intimal thickening +----------+--------+--------+--------+--------+------------------+ CCA Distal72      32                      intimal thickening +----------+--------+--------+--------+--------+------------------+ ICA Prox  56      21                                         +----------+--------+--------+--------+--------+------------------+ ICA Distal72      35                                         +----------+--------+--------+--------+--------+------------------+ ECA       78      24                                         +----------+--------+--------+--------+--------+------------------+ Portions of this table do not appear on this page. +----------+--------+-------+--------+------------+           PSV cm/sEDV cmsDescribeArm Pressure +----------+--------+-------+--------+------------+ Subclavian119                                 +----------+--------+-------+--------+------------+ +---------+--------+--+--------+--+ VertebralPSV cm/s49EDV cm/s18 +---------+--------+--+--------+--+ Left Carotid Findings:  +----------+--------+--------+--------+--------+------------------+           PSV cm/sEDV cm/sStenosisDescribeComments           +----------+--------+--------+--------+--------+------------------+ CCA Prox  97      37                      intimal thickening +----------+--------+--------+--------+--------+------------------+ CCA Distal97      33                      intimal thickening +----------+--------+--------+--------+--------+------------------+ ICA Prox  47      21                                         +----------+--------+--------+--------+--------+------------------+ ICA Distal46      25                                         +----------+--------+--------+--------+--------+------------------+ ECA       76      23                                         +----------+--------+--------+--------+--------+------------------+ +----------+--------+--------+--------+------------+ SubclavianPSV cm/sEDV cm/sDescribeArm Pressure +----------+--------+--------+--------+------------+           139                                  +----------+--------+--------+--------+------------+ +---------+--------+--+--------+--+ VertebralPSV cm/s42EDV cm/s17 +---------+--------+--+--------+--+  ABI Findings: +--------+------------------+-----+---------+--------+ Right   Rt Pressure (mmHg)IndexWaveform Comment  +--------+------------------+-----+---------+--------+ Brachial                       triphasic         +--------+------------------+-----+---------+--------+ +--------+------------------+-----+---------+-------+  Left    Lt Pressure (mmHg)IndexWaveform Comment +--------+------------------+-----+---------+-------+ Brachial                       triphasic        +--------+------------------+-----+---------+-------+  Right Doppler Findings: +--------+--------+-----+---------+--------+ Site    PressureIndexDoppler  Comments  +--------+--------+-----+---------+--------+ Brachial             triphasic         +--------+--------+-----+---------+--------+ Radial               triphasic         +--------+--------+-----+---------+--------+ Ulnar                triphasic         +--------+--------+-----+---------+--------+  Left Doppler Findings: +--------+--------+-----+---------+--------+ Site    PressureIndexDoppler  Comments +--------+--------+-----+---------+--------+ Brachial             triphasic         +--------+--------+-----+---------+--------+ Radial               triphasic         +--------+--------+-----+---------+--------+ Ulnar                triphasic         +--------+--------+-----+---------+--------+  Summary: Right Carotid: The extracranial vessels were near-normal with only minimal wall                thickening or plaque. Left Carotid: The extracranial vessels were near-normal with only minimal wall               thickening or plaque. Vertebrals:  Bilateral vertebral arteries demonstrate antegrade flow. Subclavians: Normal flow hemodynamics were seen in bilateral subclavian              arteries. Right Upper Extremity: Doppler waveforms remain within normal limits with right radial compression. Doppler waveforms remain within normal limits with right ulnar compression. Left Upper Extremity: Doppler waveform obliterate with left radial compression. Doppler waveforms decrease <50% with left ulnar compression.  Electronically signed by Ruta Hinds MD on 03/27/2020 at 4:00:08 PM.    Final    Discharge Instructions    Discharge patient   Complete by: As directed    Discharge disposition: 01-Home or Self Care   Discharge patient date: 04/04/2020      Discharge Medications: Allergies as of 04/04/2020      Reactions   Hydrocodone Nausea And Vomiting   Oxycodone Nausea And Vomiting      Medication List    STOP taking these medications   amoxicillin 875 MG  tablet Commonly known as: AMOXIL   diltiazem 240 MG 24 hr capsule Commonly known as: CARDIZEM CD   dofetilide 500 MCG capsule Commonly known as: TIKOSYN   losartan 50 MG tablet Commonly known as: COZAAR     TAKE these medications   amiodarone 400 MG tablet Commonly known as: PACERONE Take 1 tablet (400 mg total) by mouth 2 (two) times daily. For 7 days, then once daily   aspirin 81 MG EC tablet Take 81 mg by mouth daily.   aspirin-acetaminophen-caffeine 250-250-65 MG tablet Commonly known as: EXCEDRIN MIGRAINE Take 2 tablets by mouth every 6 (six) hours as needed for headache.   azelastine 0.1 % nasal spray Commonly known as: ASTELIN Place 1 spray into both nostrils 2 (two) times daily. What changed:   when to take this  reasons to take this   ferrous [id number] C-folic acid capsule Commonly known as: TRINSICON / FOLTRIN  Take 1 capsule by mouth 2 (two) times daily after a meal.   fluticasone 50 MCG/ACT nasal spray Commonly known as: FLONASE Place 2 sprays into both nostrils daily.   ibuprofen 200 MG tablet Commonly known as: ADVIL Take 800 mg by mouth every 6 (six) hours as needed for headache or moderate pain.   montelukast 10 MG tablet Commonly known as: SINGULAIR Take 1 tablet (10 mg total) by mouth at bedtime.   rosuvastatin 40 MG tablet Commonly known as: CRESTOR Take 1 tablet (40 mg total) by mouth daily. What changed: when to take this   tamsulosin 0.4 MG Caps capsule Commonly known as: FLOMAX Take 1 capsule (0.4 mg total) by mouth daily after supper.   traMADol 50 MG tablet Commonly known as: ULTRAM Take 1-2 tablets (50-100 mg total) by mouth every 4 (four) hours as needed for moderate pain. What changed:   how much to take  when to take this  reasons to take this   Xarelto 20 MG Tabs tablet Generic drug: rivaroxaban Take 1 tablet (20 mg total) by mouth daily with supper. What changed: See the new instructions.      The  patient has been discharged on:   1.Beta Blocker:  Yes [   ]                              No   [  n ]                              If No, reason:intolerance  2.Ace Inhibitor/ARB: Yes [   ]                                     No  [   n ]                                     If No, reason:no current indication, BP well controlled or somewhat low at times , may need restarted as outpatient if BP becomes elevated  3.Statin:   Yes [  x ]                  No  [   ]                  If No, reason:  4.Ecasa:  Yes  [ x  ]                  No   [   ]                  If No, reason:  Follow Up Appointments:  Follow-up Information    [facility] [place]. Go on 04/24/2020.   Specialty: Cardiothoracic Surgery Why: PA/LAT CXR to be taken (at [facility] which is in the same building as Dr. Guy Sandifer office) on 12/27 at 12:30 pm;Appointment time is at 1:00 pm Contact information: [place], [place]       Jerrol Banana., MD. Call in 1 day(s).   Specialty: Family Medicine Contact information: [address] [place] [address] [phone]  Thompson Grayer, MD .   Specialty: Cardiology Contact information: [place] [address] [phone]        Burnell Blanks, MD .   Specialty: Cardiology Contact information: [address] [place] [address] [phone]               Signed: Gaspar Bidding 04/04/2020, 9:58 AM

## 2020-03-31 NOTE — Anesthesia Postprocedure Evaluation (Signed)
Anesthesia Post Note  Patient: Jorge [name]  Procedure(s) Performed: MINIMALLY INVASIVE MAZE PROCEDURE (N/A ) TRANSESOPHAGEAL ECHOCARDIOGRAM (TEE) (N/A ) CLIPPING OF ATRIAL APPENDAGE USING ATRICURE ATRICLIP [id number] (Chest)     Patient location during evaluation: SICU Anesthesia Type: General Level of consciousness: awake and alert Pain management: pain level controlled Vital Signs Assessment: post-procedure vital signs reviewed and stable Respiratory status: nonlabored ventilation, patient connected to face mask oxygen, spontaneous breathing and respiratory function stable Cardiovascular status: stable Postop Assessment: no apparent nausea or vomiting Anesthetic complications: no   No complications documented.  Last Vitals:  Vitals:   03/31/20 0700 03/31/20 0718  BP: (!) 88/59   Pulse: 80   Resp: 17   Temp:  37 C  SpO2: 95%     Last Pain:  Vitals:   03/31/20 0718  TempSrc: Oral  PainSc:                  March Rummage Secret Kristensen

## 2020-03-31 NOTE — Progress Notes (Addendum)
TCTS DAILY ICU PROGRESS NOTE                   [address]            [place] [address]          [phone]   1 Day Post-Op Procedure(s) (LRB): MINIMALLY INVASIVE MAZE PROCEDURE (N/A) TRANSESOPHAGEAL ECHOCARDIOGRAM (TEE) (N/A) CLIPPING OF ATRIAL APPENDAGE USING ATRICURE ATRICLIP [id number]  Total Length of Stay:  LOS: 1 day   Subjective: Patient state Toradol and Ultram have really helped with chest tube pain. He denies nausea this am.  Objective: Vital signs in last 24 hours: Temp:  [98.3 F (36.8 C)-99.1 F (37.3 C)] 98.8 F (37.1 C) (12/03 0400) Pulse Rate:  [59-81] 80 (12/03 0700) Cardiac Rhythm: Atrial paced (12/03 0400) Resp:  [8-31] 17 (12/03 0700) BP: (86-120)/(52-85) 88/59 (12/03 0700) SpO2:  [91 %-100 %] 95 % (12/03 0700) Arterial Line BP: (72-165)/(36-161) 86/44 (12/03 0700) Weight:  [91.8 kg] 91.8 kg (12/03 0500)  Filed Weights   03/30/20 0602 03/31/20 0500  Weight: 89.7 kg 91.8 kg    Weight change: 2.1 kg   Hemodynamic parameters for last 24 hours: CVP:  [2 mmHg-24 mmHg] 6 mmHg  Intake/Output from previous day: 12/02 0701 - 12/03 0700 In: 3917.4 [I.V.:2661.1; Blood:250; IV Piggyback:1006.2] Out: 2665 [Urine:1845; Blood:250; Chest Tube:570]  Intake/Output this shift: No intake/output data recorded.  Current Meds: Scheduled Meds: . acetaminophen  1,000 mg Oral Q6H  . aspirin EC  325 mg Oral Daily  . [START ON 04/01/2020] aspirin EC  81 mg Oral Daily  . bisacodyl  10 mg Oral Daily   Or  . bisacodyl  10 mg Rectal Daily  . Chlorhexidine Gluconate Cloth  6 each Topical Daily  . docusate sodium  200 mg Oral Daily  . dofetilide  500 mcg Oral BID  . [START ON 04/01/2020] enoxaparin (LOVENOX) injection  40 mg Subcutaneous QHS  . insulin aspart  0-24 Units Subcutaneous Q4H  . ketorolac  15 mg Intravenous Q6H  . montelukast  10 mg Oral QHS  . [START ON 04/01/2020] pantoprazole  40 mg Oral Daily  . [START ON 04/02/2020] rosuvastatin  40 mg  Oral QPM  . sodium chloride flush  10-40 mL Intracatheter Q12H  . tamsulosin  0.4 mg Oral QPC supper   Continuous Infusions: . sodium chloride    . albumin human    . cefUROXime (ZINACEF)  IV Stopped (03/30/20 2111)  . lactated ringers    . lactated ringers 10 mL/hr at 03/31/20 0600  . phenylephrine (NEO-SYNEPHRINE) Adult infusion 10 mcg/min (03/31/20 0600)   PRN Meds:.albumin human, metoprolol tartrate, morphine injection, ondansetron (ZOFRAN) IV, sodium chloride flush, traMADol  General appearance: alert, cooperative and no distress Neurologic: intact Heart: Paced, underlying rhythm sinus Lungs: clear to auscultation bilaterally Abdomen: Soft, non tender, bowel sounds present Extremities: Trace bilateral LE edema Wound: Aquacel intact  Lab Results: CBC: Recent Labs    03/30/20 1828 03/31/20 0500  WBC 18.4* 11.9*  HGB 10.9* 10.0*  HCT 30.0* 28.4*  PLT 212 183   BMET:  Recent Labs    03/30/20 1828 03/31/20 0500  NA 138 135  K 4.2 4.0  CL 108 106  CO2 21* 19*  GLUCOSE 110* 114*  BUN 13 18  CREATININE 0.71 0.44*  CALCIUM 7.4* 7.5*    CMET: Lab Results  Component Value Date   WBC 11.9 (H) 03/31/2020   HGB 10.0 (L) 03/31/2020   HCT  28.4 (L) 03/31/2020   PLT 183 03/31/2020   GLUCOSE 114 (H) 03/31/2020   CHOL 147 04/06/2019   TRIG 83 04/06/2019   HDL 61 04/06/2019   LDLCALC 70 04/06/2019   ALT 25 03/27/2020   AST 23 03/27/2020   NA 135 03/31/2020   K 4.0 03/31/2020   CL 106 03/31/2020   CREATININE 0.44 (L) 03/31/2020   BUN 18 03/31/2020   CO2 19 (L) 03/31/2020   TSH 0.986 04/06/2019   PSA 1.3 03/27/2017   INR 1.4 (H) 03/30/2020   HGBA1C 5.3 03/27/2020      PT/INR:  Recent Labs    03/30/20 1323  LABPROT 16.7*  INR 1.4*   Radiology: [facility] Chest Port 1 View  Result Date: 03/30/2020 CLINICAL DATA:  Status post left atrial appendage clamp placement. Atelectasis. EXAM: PORTABLE CHEST 1 VIEW COMPARISON:  March 27, 2020. FINDINGS: There is a chest  tube on the right. There is an apparent mediastinal drain. Left jugular catheter tip is at the junction of the left jugular and left innominate veins. There is subcutaneous air on the right. No pneumothorax evident. Temporary pacemaker wires are attached to the right heart. There is a left atrial appendage clamp present. There is a loop recorder on the left. There is atelectatic change in the bases, more on the right than on the left. No edema or airspace opacity. Heart is upper normal in size with pulmonary vascularity normal. No adenopathy. No bone lesions. IMPRESSION: Tube and catheter positions as described. Subcutaneous air noted on the right but no pneumothorax. Postoperative changes noted. Heart upper normal in size. Right base atelectasis with small amount of left base atelectasis. No edema or airspace opacity. Electronically Signed   By: Lowella Grip III M.D.   On: 03/30/2020 13:47   ECHO INTRAOPERATIVE TEE  Result Date: 03/30/2020  *INTRAOPERATIVE TRANSESOPHAGEAL REPORT *  Patient Name:   Jorge [name] Date of Exam: 03/30/2020 Medical Rec #:  [id number]          Height:       68.0 in Accession #:    [id number]         Weight:       197.8 lb Date of Birth:  22-Aug-1965          BSA:          2.03 m Patient Age:    54 years           BP:           143/87 mmHg Patient Gender: M                  HR:           66 bpm. Exam Location:  Anesthesiology Transesophogeal exam was perform intraoperatively during surgical procedure. Patient was closely monitored under general anesthesia during the entirety of examination. Indications:     Atrial Fibrillation Sonographer:     Vickie Epley RDCS Performing Phys: [place] Diagnosing Phys: Belenda Cruise Stoltzfus Complications: No known complications during this procedure. POST-OP IMPRESSIONS - Left Ventricle: The left ventricle is unchanged from pre-bypass. - Left Atrial Appendage: A is completely occluded Atriclip was placed. - Aortic Valve: The aortic  valve appears unchanged from pre-bypass. - Mitral Valve: The mitral valve appears unchanged from pre-bypass. - Tricuspid Valve: The tricuspid valve appears unchanged from pre-bypass. - Interatrial Septum: The interatrial septum appears unchanged from pre-bypass. - Pericardium: The pericardium appears unchanged from pre-bypass. S/P MAZE. Emergence from  CPB on minimal phenylephrine support. No new or worsening wall motion or valvular abnormalities. EF post procedure unchanged 50-55%. Mild central secondary MR 2/2 atrial dilation unchanged. Atriclip to LAA applied positioned properly without flow on interrogation. PRE-OP FINDINGS  Left Ventricle: The left ventricle has low normal systolic function, with an ejection fraction of 50-55%. The cavity size was normal. There is no increase in left ventricular wall thickness. No evidence of left ventricular regional wall motion abnormalities. Right Ventricle: The right ventricle has normal systolic function. The cavity was normal. There is no increase in right ventricular wall thickness. Right ventricular systolic pressure is normal. Left Atrium: Left atrial size was dilated. The left atrial appendage is well visualized and there is no evidence of thrombus present. Left atrial appendage velocity is normal at greater than 40 cm/s. Right Atrium: Right atrial size was normal in size. Interatrial Septum: No atrial level shunt detected by color flow Doppler. Pericardium: There is no evidence of pericardial effusion. There is no pleural effusion. Mitral Valve: The mitral valve is dilated. No thickening of the mitral valve leaflet. No calcification of the mitral valve leaflet. Mitral valve regurgitation is mild by color flow Doppler. The MR jet is centrally-directed. There is no evidence of mitral  valve vegetation. Pulmonary venous flow is normal. There is No evidence of mitral stenosis. Tricuspid Valve: The tricuspid valve was normal in structure. Tricuspid valve regurgitation was  not visualized by color flow Doppler. There is no evidence of tricuspid valve vegetation. Aortic Valve: The aortic valve is tricuspid Aortic valve regurgitation is trivial by color flow Doppler. There is no evidence of aortic valve stenosis. There is no evidence of a vegetation on the aortic valve. Pulmonic Valve: The pulmonic valve was normal in structure No evidence of pumonic stenosis. Pulmonic valve regurgitation is not visualized by color flow Doppler. Aorta: The aortic root and ascending aorta are normal in size and structure. There is evidence of layered immobile plaque in the descending aorta; Grade I, measuring 1-61mm in size. Pulmonary Artery: The pulmonary artery is of normal size. Venous: The inferior vena cava is normal in size with greater than 50% respiratory variability, suggesting right atrial pressure of 3 mmHg. +-------------+--------++ AORTIC VALVE          +-------------+--------++ AV Mean Grad:1.0 mmHg +-------------+--------++ +-------------+--------++ MITRAL VALVE          +-------------+--------++ MV Mean grad:1.0 mmHg +-------------+--------++  Rochele Pages Electronically signed by Rochele Pages Signature Date/Time: 03/30/2020/2:56:13 PM    Final     Assessment/Plan: S/P Procedure(s) (LRB): MINIMALLY INVASIVE MAZE PROCEDURE (N/A) TRANSESOPHAGEAL ECHOCARDIOGRAM (TEE) (N/A) CLIPPING OF ATRIAL APPENDAGE USING ATRICURE ATRICLIP PRO245   1. CV-History of chronic recurrent paroxysmal atrial fibrillation (has had 2 previous ablations). On Cardizem, Tikosyn, and Xarelto prior to surgery, but will not resume any of this now. AAI paced at 80 this am (underlying heart rate low 60's). CO/CI-5.2/2.6. On Neo Synephrine drip;will wean as able. 2. Pulmonary-on 4 liters of oxygen via [place]. On CPAP at night for OSA. Chest tubes with 570 cc since surgery. CXR this am appears stable. Encourage incentive spirometer 3. CBGs 106/108/95. Pre op HGA1C 5.3. Wean off Insulin drip. Stop  accu checks, SS PRN after transfer 4. Expected post op blood loss anemia-H and H this am stable at 10 and 28.4 5. Please see progression orders  Donielle Liston Alba PA-C 03/31/2020 7:13 AM    I have seen and examined the patient and agree with the assessment and plan as outlined.  Doing very well POD1.  Maintaining NSR w/ stable hemodynamics on low dose Neo for BP support.  Breathing comfortably w/ O2 sats 95% on nasal cannula and CXR looks clear.  Mobilize.  D/C lines.  Rexene Alberts, MD 03/31/2020 8:08 AM

## 2020-03-31 NOTE — Progress Notes (Signed)
TCTS BRIEF SICU PROGRESS NOTE  1 Day Post-Op  S/P Procedure(s) (LRB): MINIMALLY INVASIVE MAZE PROCEDURE (N/A) TRANSESOPHAGEAL ECHOCARDIOGRAM (TEE) (N/A) CLIPPING OF ATRIAL APPENDAGE USING ATRICURE ATRICLIP PRO245   Stable day Maintaining NSR w/ stable BP off Neo Breathing comfortably on room air   Plan: Continue current plan  Rexene Alberts, MD 03/31/2020 5:06 PM

## 2020-03-31 NOTE — Discharge Instructions (Signed)
Discharge Instructions:  1. You may shower, please wash incisions daily with soap and water and keep dry.  If you wish to cover wounds with dressing you may do so but please keep clean and change daily.  No tub baths or swimming until incisions have completely healed.  If your incisions become red or develop any drainage please call our office at [phone]  2. No Driving until cleared by Dr. Guy Sandifer office and you are no longer using narcotic pain medications  3. Fever of 101.5 for at least 24 hours with no source, please contact our office at [phone]  4. Activity- up as tolerated, please walk at least 3 times per day.  Avoid strenuous activity, no lifting, pushing, or pulling with your arms over 8-10 lbs for a minimum of 2 weeks  5. If any questions or concerns arise, please do not hesitate to contact our office at [phone]

## 2020-03-31 NOTE — Plan of Care (Signed)

## 2020-04-01 ENCOUNTER — Inpatient Hospital Stay (HOSPITAL_COMMUNITY): Payer: BC Managed Care – PPO

## 2020-04-01 LAB — BASIC METABOLIC PANEL
Anion gap: 8 (ref 5–15)
BUN: 19 mg/dL (ref 6–20)
CO2: 25 mmol/L (ref 22–32)
Calcium: 8.1 mg/dL — ABNORMAL LOW (ref 8.9–10.3)
Chloride: 100 mmol/L (ref 98–111)
Creatinine, Ser: 0.81 mg/dL (ref 0.61–1.24)
GFR, Estimated: >60 mL/min (ref >60)
Glucose, Bld: 110 mg/dL — ABNORMAL HIGH (ref 70–99)
Potassium: 3.8 mmol/L (ref 3.5–5.1)
Sodium: 133 mmol/L — ABNORMAL LOW (ref 135–145)

## 2020-04-01 LAB — CBC
HCT: 26.4 % — ABNORMAL LOW (ref 39.0–52.0)
Hemoglobin: 8.9 g/dL — ABNORMAL LOW (ref 13.0–17.0)
MCH: 30.5 pg (ref 26.0–34.0)
MCHC: 33.7 g/dL (ref 30.0–36.0)
MCV: 90.4 fL (ref 80.0–100.0)
Platelets: 168 10*3/uL (ref 150–400)
RBC: 2.92 MIL/uL — ABNORMAL LOW (ref 4.22–5.81)
RDW: 12.5 % (ref 11.5–15.5)
WBC: 14.2 10*3/uL — ABNORMAL HIGH (ref 4.0–10.5)
nRBC: 0 % (ref 0.0–0.2)

## 2020-04-01 LAB — GLUCOSE, CAPILLARY
Glucose-Capillary: 116 mg/dL — ABNORMAL HIGH (ref 70–99)
Glucose-Capillary: 110 mg/dL — ABNORMAL HIGH (ref 70–99)
Glucose-Capillary: 108 mg/dL — ABNORMAL HIGH (ref 70–99)

## 2020-04-01 MED ORDER — FE FUMARATE-B12-VIT C-FA-IFC PO CAPS
1.0000 | ORAL_CAPSULE | Freq: Every day | ORAL | Status: DC
  Administered 2020-04-02 – 2020-04-03 (×2): 1 via ORAL
  Filled 2020-04-01 (×3): qty 1

## 2020-04-01 MED ORDER — ~~LOC~~ CARDIAC SURGERY, PATIENT & FAMILY EDUCATION: Freq: Once | Status: AC

## 2020-04-01 MED ORDER — FUROSEMIDE 10 MG/ML IJ SOLN
20.0000 mg | Freq: Two times a day (BID) | INTRAMUSCULAR | Status: AC
  Administered 2020-04-01 (×2): 20 mg via INTRAVENOUS
  Filled 2020-04-01 (×2): qty 2

## 2020-04-01 MED ORDER — POTASSIUM CHLORIDE 10 MEQ/50ML IV SOLN
10.0000 meq | INTRAVENOUS | Status: AC
  Administered 2020-04-01 (×4): 10 meq via INTRAVENOUS
  Filled 2020-04-01 (×4): qty 50

## 2020-04-01 NOTE — Progress Notes (Addendum)
      [address]       [place], [address]             [phone]        CARDIOTHORACIC SURGERY PROGRESS NOTE   R2 Days Post-Op Procedure(s) (LRB): MINIMALLY INVASIVE MAZE PROCEDURE (N/A) TRANSESOPHAGEAL ECHOCARDIOGRAM (TEE) (N/A) CLIPPING OF ATRIAL APPENDAGE USING ATRICURE ATRICLIP [id number]  Subjective: Looks good.  Slept well last night.  Pain control much improved.  Objective: Vital signs: BP Readings from Last 1 Encounters:  04/01/20 (!) 152/69   Pulse Readings from Last 1 Encounters:  04/01/20 80   Resp Readings from Last 1 Encounters:  04/01/20 (!) 21   Temp Readings from Last 1 Encounters:  04/01/20 98.8 F (37.1 C) (Oral)    Hemodynamics:    Physical Exam:  Rhythm:   sinus  Breath sounds: clear  Heart sounds:  RRR  Incisions:  Dressings dry, intact  Abdomen:  Soft, non-distended, non-tender  Extremities:  Warm, well-perfused  Chest tubes:  decreasing volume thin serosanguinous output, no air leak    Intake/Output from previous day: 12/03 0701 - 12/04 0700 In: 443.8 [I.V.:193.7; IV Piggyback:250] Out: 925 [Urine:585; Chest Tube:340] Intake/Output this shift: No intake/output data recorded.  Lab Results:  CBC: Recent Labs    03/31/20 1704 04/01/20 0556  WBC 14.3* 14.2*  HGB 9.7* 8.9*  HCT 29.3* 26.4*  PLT 134* 168    BMET:  Recent Labs    03/31/20 1730 04/01/20 0556  NA 133* 133*  K 3.9 3.8  CL 104 100  CO2 22 25  GLUCOSE 134* 110*  BUN 21* 19  CREATININE 0.79 0.81  CALCIUM 7.8* 8.1*     PT/INR:   Recent Labs    03/30/20 1323  LABPROT 16.7*  INR 1.4*    CBG (last 3)  Recent Labs    03/31/20 2344 04/01/20 0355 04/01/20 0629  GLUCAP 120* 116* 110*    ABG    Component Value Date/Time   PHART 7.358 03/30/2020 1501   PCO2ART 34.4 03/30/2020 1501   PO2ART 61 (L) 03/30/2020 1501   HCO3 19.3 (L) 03/30/2020 1501   TCO2 20 (L) 03/30/2020 1501   ACIDBASEDEF 6.0 (H) 03/30/2020 1501   O2SAT 90.0  03/30/2020 1501    CXR: Looks good  Assessment/Plan: S/P Procedure(s) (LRB): MINIMALLY INVASIVE MAZE PROCEDURE (N/A) TRANSESOPHAGEAL ECHOCARDIOGRAM (TEE) (N/A) CLIPPING OF ATRIAL APPENDAGE USING ATRICURE ATRICLIP [id number]  Doing well POD2 Maintaining NSR w/ stable BP Breathing comfortably on nasal cannula, CXR looks good Expected post op acute blood loss anemia, Hgb down slightly 8.9 Expected post op atelectasis, mild Expected post op volume excess, weight reportedly 1 kg > preop, UOP adequate OSA on CPAP at home   Mobilize  Diuresis  Leave chest tubes 1 more day  Continue Tikosyn and D/C pacing wires tomorrow if rhythm stable  Transfer 4E  Anticipate d/c home 2-3 days  Restart Xarelto at hospital d/c   Rexene Alberts, MD 04/01/2020 8:15 AM

## 2020-04-01 NOTE — Progress Notes (Signed)
Mobility Specialist: Progress Note   04/01/20 1557  Mobility  Activity Refused mobility   Pt refused mobility due to pain from chest tube. Will f/u tomorrow.   [facility] Kwadwo Taras Mobility Specialist

## 2020-04-02 LAB — CBC
HCT: 27.7 % — ABNORMAL LOW (ref 39.0–52.0)
Hemoglobin: 9.4 g/dL — ABNORMAL LOW (ref 13.0–17.0)
MCH: 30.3 pg (ref 26.0–34.0)
MCHC: 33.9 g/dL (ref 30.0–36.0)
MCV: 89.4 fL (ref 80.0–100.0)
Platelets: 187 10*3/uL (ref 150–400)
RBC: 3.1 MIL/uL — ABNORMAL LOW (ref 4.22–5.81)
RDW: 12.5 % (ref 11.5–15.5)
WBC: 13.3 10*3/uL — ABNORMAL HIGH (ref 4.0–10.5)
nRBC: 0 % (ref 0.0–0.2)

## 2020-04-02 LAB — BASIC METABOLIC PANEL
Anion gap: 8 (ref 5–15)
BUN: 17 mg/dL (ref 6–20)
CO2: 28 mmol/L (ref 22–32)
Calcium: 8.4 mg/dL — ABNORMAL LOW (ref 8.9–10.3)
Chloride: 97 mmol/L — ABNORMAL LOW (ref 98–111)
Creatinine, Ser: 0.7 mg/dL (ref 0.61–1.24)
GFR, Estimated: >60 mL/min (ref >60)
Glucose, Bld: 119 mg/dL — ABNORMAL HIGH (ref 70–99)
Potassium: 4.2 mmol/L (ref 3.5–5.1)
Sodium: 133 mmol/L — ABNORMAL LOW (ref 135–145)

## 2020-04-02 MED ORDER — AMIODARONE HCL IN DEXTROSE 360-4.14 MG/200ML-% IV SOLN
30.0000 mg/h | INTRAVENOUS | Status: DC
  Administered 2020-04-03 (×3): 30 mg/h via INTRAVENOUS
  Filled 2020-04-02 (×3): qty 200

## 2020-04-02 MED ORDER — GUAIFENESIN ER 600 MG PO TB12
600.0000 mg | ORAL_TABLET | Freq: Two times a day (BID) | ORAL | Status: DC
  Administered 2020-04-02 – 2020-04-03 (×4): 600 mg via ORAL
  Filled 2020-04-02 (×5): qty 1

## 2020-04-02 MED ORDER — AMIODARONE HCL IN DEXTROSE 360-4.14 MG/200ML-% IV SOLN
INTRAVENOUS | Status: AC
  Administered 2020-04-02: 150 mg via INTRAVENOUS
  Filled 2020-04-02: qty 200

## 2020-04-02 MED ORDER — AMIODARONE LOAD VIA INFUSION
150.0000 mg | Freq: Once | INTRAVENOUS | Status: AC
  Filled 2020-04-02: qty 83.34

## 2020-04-02 MED ORDER — AMIODARONE HCL 200 MG PO TABS: 400.0000 mg | ORAL_TABLET | Freq: Every day | ORAL | Status: DC

## 2020-04-02 MED ORDER — AMIODARONE HCL IN DEXTROSE 360-4.14 MG/200ML-% IV SOLN: 60.0000 mg/h | INTRAVENOUS | Status: AC

## 2020-04-02 MED ORDER — AMIODARONE IV BOLUS ONLY 150 MG/100ML
150.0000 mg | Freq: Once | INTRAVENOUS | Status: AC
  Administered 2020-04-02: 150 mg via INTRAVENOUS
  Filled 2020-04-02 (×2): qty 100

## 2020-04-02 MED ORDER — AMIODARONE LOAD VIA INFUSION
150.0000 mg | Freq: Once | INTRAVENOUS | Status: DC
  Filled 2020-04-02: qty 83.34

## 2020-04-02 MED ORDER — AMIODARONE HCL 200 MG PO TABS
400.0000 mg | ORAL_TABLET | Freq: Three times a day (TID) | ORAL | Status: DC
  Administered 2020-04-03 – 2020-04-04 (×5): 400 mg via ORAL
  Filled 2020-04-02 (×6): qty 2

## 2020-04-02 NOTE — Progress Notes (Signed)
Pt states he is just going to wear his oxygen tonight due to unable to get CPAP to fit right and feel comfortable on CPAP last night.

## 2020-04-02 NOTE — Progress Notes (Signed)
Mobility Specialist: Progress Note   04/02/20 1056  Mobility  Activity Ambulated in hall  Level of Assistance Independent  Assistive Device None  Distance Ambulated (ft) 440 ft  Mobility Response Tolerated well  Mobility performed by Mobility specialist  Bed Position Semi-fowlers  $Mobility charge 1 Mobility   Pre-Mobility on 3 L./min [place]: 82 HR, 123/73 BP, 94% SpO2 During Mobility on 3 L./min [place]: 127HR, 100% SpO2 Post-Mobility on 2 L/min [place]: 103 HR, 147/80 BP, 81% SpO2 After 1-2 minutes on 2 L/min [place]: 94% SpO2  Pt expressed readiness to have chest tubes removed during ambulation. Pt began ambulation on 3 L/min [place] and dropped to 1 L/min [place] by the end of ambulation. Pt desat to 81% after returning to room on 1 L/min  so O2 was turned back up to 2 L/min. Pt's sats returned to mid 90%s after 1-2 minutes of rest laying supine in bed, RN notified. Pt expressed he felt like he had just jogged a mile. Encouraged pt walk at least 2 more times today. Will f/u as time allows.   [facility] Shery Wauneka Mobility Specialist

## 2020-04-02 NOTE — Progress Notes (Addendum)
      [address]       [place] [address]             [phone]        3 Days Post-Op Procedure(s) (LRB): MINIMALLY INVASIVE MAZE PROCEDURE (N/A) TRANSESOPHAGEAL ECHOCARDIOGRAM (TEE) (N/A) CLIPPING OF ATRIAL APPENDAGE USING ATRICURE ATRICLIP [id number]  Subjective: Patient in good spirits this am. He has some pain at chest tube sites but states it is getting better  Objective: Vital signs in last 24 hours: Temp:  [98.5 F (36.9 C)-99.4 F (37.4 C)] 98.5 F (36.9 C) (12/05 0422) Pulse Rate:  [77-91] 77 (12/05 0422) Cardiac Rhythm: Normal sinus rhythm (12/04 1900) Resp:  [15-25] 16 (12/05 0422) BP: (112-156)/(65-87) 124/73 (12/05 0422) SpO2:  [89 %-97 %] 92 % (12/05 0422) Weight:  [90.7 kg] 90.7 kg (12/05 0422)  Pre op weight 89.7 kg Current Weight  04/02/20 90.7 kg      Intake/Output from previous day: 12/04 0701 - 12/05 0700 In: 53 [P.O.:780] Out: 2800 [Urine:2500; Chest Tube:300]   Physical Exam:  Cardiovascular: RRR Pulmonary: Clear to auscultation bilaterally Abdomen: Soft, non tender, bowel sounds present. Extremities: Trace bilateral lower extremity edema. Wounds: Aquacel was removed and wound is clean and dry.  No erythema or signs of infection. Chest tubes: to suction,  no air leak. Some bloody like drainage around chest tubes  Lab Results: CBC: Recent Labs    04/01/20 0556 04/02/20 0206  WBC 14.2* 13.3*  HGB 8.9* 9.4*  HCT 26.4* 27.7*  PLT 168 187   BMET:  Recent Labs    04/01/20 0556 04/02/20 0206  NA 133* 133*  K 3.8 4.2  CL 100 97*  CO2 25 28  GLUCOSE 110* 119*  BUN 19 17  CREATININE 0.81 0.70  CALCIUM 8.1* 8.4*    PT/INR:  Lab Results  Component Value Date   INR 1.4 (H) 03/30/2020   INR 1.1 03/27/2020   ABG:  INR: Will add last result for INR, ABG once components are confirmed Will add last 4 CBG results once components are confirmed  Assessment/Plan: 1. CV-History of chronic recurrent paroxysmal  atrial fibrillation (has had 2 previous ablations). SR this am. On  Tikosyn 500 mcg bid; Xarelto to be restarted at discharge.  2. Pulmonary-on 3 liters of oxygen via [place]. Wean as able. On CPAP at night for OSA. Chest tubes with 300 cc last 24 hours. Likely remove EPWs and then chest tubes. Check CXR in am. Encourage incentive spirometer. Will start Mucinex to help with cough. 3. Expected post op blood loss anemia-H and H this am stable at 9.4 and 27.7. Continue [place] 4. Possibly home in am  Donielle M ZimmermanPA-C 04/02/2020,7:44 AM    I have seen and examined the patient and agree with the assessment and plan as outlined.  However, patient just went into Afib w/ HR 150.  Will stop Tikosyn and start Amiodarone with a bolus.    Rexene Alberts, MD 04/02/2020 12:55 PM

## 2020-04-02 NOTE — Progress Notes (Signed)
RN notified by CCMD that patient's rhythm changed at 1211.  RN immediately went to assess patient.  Patient was asymptomatic and denied any discomfort/SOB.  EKG performed and read A.Fib with RVR. RN notified charge RN and charge RN assessed patient. [place] paged provider on-call. Cornelius spoke with the on-call provider and made aware that MD would place new orders.  New orders have been acknowledged, awaiting pharmacy verification.  RN will continue to monitor patient and will implement all orders as written.    Patient remains asymptomatic and is resting in the bed with call bed at reach, bed is in the lowest position.   [place], [place] 04/02/2020

## 2020-04-03 ENCOUNTER — Inpatient Hospital Stay (HOSPITAL_COMMUNITY): Payer: BC Managed Care – PPO

## 2020-04-03 LAB — MAGNESIUM: Magnesium: 1.8 mg/dL (ref 1.7–2.4)

## 2020-04-03 LAB — BASIC METABOLIC PANEL
Anion gap: 9 (ref 5–15)
BUN: 12 mg/dL (ref 6–20)
CO2: 29 mmol/L (ref 22–32)
Calcium: 8.3 mg/dL — ABNORMAL LOW (ref 8.9–10.3)
Chloride: 95 mmol/L — ABNORMAL LOW (ref 98–111)
Creatinine, Ser: 0.64 mg/dL (ref 0.61–1.24)
GFR, Estimated: >60 mL/min (ref >60)
Glucose, Bld: 110 mg/dL — ABNORMAL HIGH (ref 70–99)
Potassium: 3.5 mmol/L (ref 3.5–5.1)
Sodium: 133 mmol/L — ABNORMAL LOW (ref 135–145)

## 2020-04-03 MED ORDER — MAGNESIUM SULFATE 4 GM/100ML IV SOLN
4.0000 g | Freq: Once | INTRAVENOUS | Status: AC
  Administered 2020-04-03: 4 g via INTRAVENOUS
  Filled 2020-04-03: qty 100

## 2020-04-03 MED ORDER — SALINE SPRAY 0.65 % NA SOLN
1.0000 | NASAL | Status: DC | PRN
  Filled 2020-04-03: qty 44

## 2020-04-03 MED ORDER — POTASSIUM CHLORIDE CRYS ER 20 MEQ PO TBCR
40.0000 meq | EXTENDED_RELEASE_TABLET | Freq: Three times a day (TID) | ORAL | Status: AC
  Administered 2020-04-03 (×2): 40 meq via ORAL
  Filled 2020-04-03 (×2): qty 2

## 2020-04-03 MED ORDER — POTASSIUM CHLORIDE CRYS ER 20 MEQ PO TBCR: 20.0000 meq | EXTENDED_RELEASE_TABLET | Freq: Three times a day (TID) | ORAL | Status: DC

## 2020-04-03 NOTE — Progress Notes (Signed)
1016 Checked with pt earlier this morning to walk. Pt stated he had bathed and needed time to rest. Stated would be back about 1000 or 1030. Returned at 1016 and pt getting pacing wires pulled. Will return later. Graylon Good RN BSN 04/03/2020 10:17 AM

## 2020-04-03 NOTE — Research (Signed)
[facility] AF REGISTRY Informed Consent   Subject Name: Jorge [name]  Subject met inclusion and exclusion criteria.  The informed consent form, registry requirements and expectations were reviewed with the subject and questions and concerns were addressed prior to the signing of the consent form.  The subject verbalized understanding of the registry requirements.  The subject agreed to participate in the [facility] AF Registry and signed the informed consent.  The informed consent was obtained prior to performance of any protocol-specific procedures for the subject.  A copy of the signed informed consent was given to the subject and a copy was placed in the subject's medical record.  [name] 04/03/2020, 8:59 AM

## 2020-04-03 NOTE — Progress Notes (Addendum)
      [address]       [place] [address]             [phone]      4 Days Post-Op Procedure(s) (LRB): MINIMALLY INVASIVE MAZE PROCEDURE (N/A) TRANSESOPHAGEAL ECHOCARDIOGRAM (TEE) (N/A) CLIPPING OF ATRIAL APPENDAGE USING ATRICURE ATRICLIP CXK481 Subjective: Feels okay this morning. He is having some more nasal congestions.   Objective: Vital signs in last 24 hours: Temp:  [98.1 F (36.7 C)-99 F (37.2 C)] 98.6 F (37 C) (12/06 0505) Pulse Rate:  [63-164] 96 (12/06 0505) Cardiac Rhythm: Atrial fibrillation (12/05 1900) Resp:  [13-23] 18 (12/06 0505) BP: (98-129)/(71-92) 118/78 (12/06 0505) SpO2:  [77 %-100 %] 90 % (12/06 0505) Weight:  [90 kg] 90 kg (12/06 0505)     Intake/Output from previous day: 12/05 0701 - 12/06 0700 In: 561 [P.O.:480; I.V.:81] Out: 1370 [Urine:1150; Chest Tube:220] Intake/Output this shift: No intake/output data recorded.  General appearance: alert, cooperative and no distress Heart: regular rate and rhythm, S1, S2 normal, no murmur, click, rub or gallop Lungs: clear to auscultation bilaterally Abdomen: soft, non-tender; bowel sounds normal; no masses,  no organomegaly Extremities: extremities normal, atraumatic, no cyanosis or edema Wound: clean and dry  Lab Results: Recent Labs    04/01/20 0556 04/02/20 0206  WBC 14.2* 13.3*  HGB 8.9* 9.4*  HCT 26.4* 27.7*  PLT 168 187   BMET:  Recent Labs    04/02/20 0206 04/03/20 0253  NA 133* 133*  K 4.2 3.5  CL 97* 95*  CO2 28 29  GLUCOSE 119* 110*  BUN 17 12  CREATININE 0.70 0.64  CALCIUM 8.4* 8.3*    PT/INR: No results for input(s): LABPROT, INR in the last 72 hours. ABG    Component Value Date/Time   PHART 7.358 03/30/2020 1501   HCO3 19.3 (L) 03/30/2020 1501   TCO2 20 (L) 03/30/2020 1501   ACIDBASEDEF 6.0 (H) 03/30/2020 1501   O2SAT 90.0 03/30/2020 1501   CBG (last 3)  Recent Labs    04/01/20 0355 04/01/20 0629 04/01/20 1112  GLUCAP 116* 110* 108*     Assessment/Plan: S/P Procedure(s) (LRB): MINIMALLY INVASIVE MAZE PROCEDURE (N/A) TRANSESOPHAGEAL ECHOCARDIOGRAM (TEE) (N/A) CLIPPING OF ATRIAL APPENDAGE USING ATRICURE ATRICLIP PRO245  1. CV- Chronic recurrent paroxysmal atrial fibrillation. In and out of afib yesterday. Amio bolus and drip. Tikosyn stopped. Holding metoprolol for intolerance.  2. Pulm- tolerating room air with good oxygen saturation. On CPAP for OSA. Chest tubes put out 220cc/24 hours. CXR stable, no large pneumo or pleural effusion. Encourage incentive spirometer.  3. Expected post op blood loss anemia-H and H this am stable at 9.4 and 27.7. Continue Trinsicon 4. Potassium 3.5, replace. Mag 1.8, replace. Creatinine stable 0.64.   Plan: Wires and chest tubes out today. Replace electrolytes. Keep Amio drip going today and plan to switch to oral Amio tomorrow. Weaning oxygen as able.    LOS: 4 days    Elgie Collard 04/03/2020   I have seen and examined the patient and agree with the assessment and plan as outlined.  Converted back to NSR last night on amiodarone.  Continue load IV today and convert to oral tomorrow if patient remains in NSR.  Remove pacing wires today and restart Xarelto tomorrow.  Rexene Alberts, MD 04/03/2020 9:25 AM

## 2020-04-03 NOTE — Progress Notes (Signed)
Does not want cpap at this time.

## 2020-04-03 NOTE — Progress Notes (Signed)
Pt EPW removed and intact. Pt tolerated well. Vitals monitored q 44mins x 1hr. Pt agrees to 1 hr bedrest. Will continue to monitor  Jerald Kief, RN

## 2020-04-03 NOTE — Progress Notes (Signed)
CARDIAC REHAB PHASE I   PRE:  Rate/Rhythm: 83 sR  BP:  Supine: 128/74  Sitting:   Standing:    SaO2: 94%RA  MODE:  Ambulation: 180 ft   POST:  Rate/Rhythm: 98 SR  BP:  Supine: 140/78  Sitting:   Standing:    SaO2: 86%RA hall ,  93%RA room 1250-1322 Pt walked 180 ft on RA with rolling walker and asst x1. Checked sats at 86% RA but fingers cold. Had pt turn around as he was feeling a little weak. To room and sitting on side of bed and sats at 93%RA on ear. Pt feeling a little nauseated which passed after a few minutes. Left on RA. Will try ear for walking next walk to see if accurate. Encouraged IS.    Graylon Good, RN BSN  04/03/2020 1:15 PM

## 2020-04-03 NOTE — Progress Notes (Signed)
Mobility Specialist - Progress Note   04/03/20 1438  Mobility  Activity Ambulated in hall  Level of Assistance Contact guard assist, steadying assist  Assistive Device None  Distance Ambulated (ft) 200 ft  Mobility Response Tolerated well  Mobility performed by Mobility specialist  $Mobility charge 1 Mobility    Pre-mobility: 77 HR, 93% SpO2 During mobility: 91 HR, 94% SpO2 Post-mobility: 84 HR, 90% SpO2  Pt ambulated on RA. He c/o feeling weak today, but was otherwise asx. Pt sitting on edge of bed after walk to eat some of his lunch.   Pricilla Handler Mobility Specialist Mobility Specialist Phone: [phone]

## 2020-04-04 ENCOUNTER — Inpatient Hospital Stay (HOSPITAL_COMMUNITY): Payer: BC Managed Care – PPO

## 2020-04-04 LAB — BASIC METABOLIC PANEL
Anion gap: 8 (ref 5–15)
BUN: 9 mg/dL (ref 6–20)
CO2: 28 mmol/L (ref 22–32)
Calcium: 8.1 mg/dL — ABNORMAL LOW (ref 8.9–10.3)
Chloride: 97 mmol/L — ABNORMAL LOW (ref 98–111)
Creatinine, Ser: 0.65 mg/dL (ref 0.61–1.24)
GFR, Estimated: >60 mL/min (ref >60)
Glucose, Bld: 125 mg/dL — ABNORMAL HIGH (ref 70–99)
Potassium: 4.3 mmol/L (ref 3.5–5.1)
Sodium: 133 mmol/L — ABNORMAL LOW (ref 135–145)

## 2020-04-04 LAB — MAGNESIUM: Magnesium: 2.1 mg/dL (ref 1.7–2.4)

## 2020-04-04 MED ORDER — TRAMADOL HCL 50 MG PO TABS
50.0000 mg | ORAL_TABLET | ORAL | 0 refills | Status: DC | PRN
Start: 2020-04-04 — End: 2022-09-16

## 2020-04-04 MED ORDER — FE FUMARATE-B12-VIT C-FA-IFC PO CAPS
1.0000 | ORAL_CAPSULE | Freq: Two times a day (BID) | ORAL | 1 refills | Status: DC
Start: 2020-04-04 — End: 2021-03-09

## 2020-04-04 MED ORDER — AMIODARONE HCL 400 MG PO TABS
400.0000 mg | ORAL_TABLET | Freq: Two times a day (BID) | ORAL | 1 refills | Status: DC
Start: 2020-04-04 — End: 2020-05-01

## 2020-04-04 MED ORDER — RIVAROXABAN 20 MG PO TABS: 20.0000 mg | ORAL_TABLET | Freq: Every day | ORAL | Status: DC

## 2020-04-04 MED FILL — Sodium Chloride IV Soln 0.9%: INTRAVENOUS | Qty: 2000 | Status: AC

## 2020-04-04 MED FILL — Electrolyte-R (PH 7.4) Solution: INTRAVENOUS | Qty: 4000 | Status: AC

## 2020-04-04 MED FILL — Sodium Bicarbonate IV Soln 8.4%: INTRAVENOUS | Qty: 50 | Status: AC

## 2020-04-04 MED FILL — Heparin Sodium (Porcine) Inj 1000 Unit/ML: INTRAMUSCULAR | Qty: 20 | Status: AC

## 2020-04-04 NOTE — Progress Notes (Addendum)
      [address]       [place] [address]             [phone]      5 Days Post-Op Procedure(s) (LRB): MINIMALLY INVASIVE MAZE PROCEDURE (N/A) TRANSESOPHAGEAL ECHOCARDIOGRAM (TEE) (N/A) CLIPPING OF ATRIAL APPENDAGE USING ATRICURE ATRICLIP [id number] Subjective: Feels okay today. He did have some vomiting last night.   Objective: Vital signs in last 24 hours: Temp:  [98.3 F (36.8 C)-98.9 F (37.2 C)] 98.8 F (37.1 C) (12/07 0402) Pulse Rate:  [80-98] 80 (12/07 0402) Cardiac Rhythm: Normal sinus rhythm (12/06 1900) Resp:  [14-31] 20 (12/07 0402) BP: (108-143)/(67-84) 124/74 (12/07 0402) SpO2:  [91 %-99 %] 96 % (12/07 0402) Weight:  [90.1 kg] 90.1 kg (12/07 0626)     Intake/Output from previous day: 12/06 0701 - 12/07 0700 In: 909.6 [P.O.:550; I.V.:359.6] Out: 2125 [Urine:2125] Intake/Output this shift: No intake/output data recorded.  General appearance: alert, cooperative and no distress Heart: regular rate and rhythm, S1, S2 normal, no murmur, click, rub or gallop Lungs: clear to auscultation bilaterally Abdomen: soft, non-tender; bowel sounds normal; no masses,  no organomegaly Extremities: extremities normal, atraumatic, no cyanosis or edema Wound: clean and dry  Lab Results: Recent Labs    04/02/20 0206  WBC 13.3*  HGB 9.4*  HCT 27.7*  PLT 187   BMET:  Recent Labs    04/03/20 0253 04/04/20 0147  NA 133* 133*  K 3.5 4.3  CL 95* 97*  CO2 29 28  GLUCOSE 110* 125*  BUN 12 9  CREATININE 0.64 0.65  CALCIUM 8.3* 8.1*    PT/INR: No results for input(s): LABPROT, INR in the last 72 hours. ABG    Component Value Date/Time   PHART 7.358 03/30/2020 1501   HCO3 19.3 (L) 03/30/2020 1501   TCO2 20 (L) 03/30/2020 1501   ACIDBASEDEF 6.0 (H) 03/30/2020 1501   O2SAT 90.0 03/30/2020 1501   CBG (last 3)  Recent Labs    04/01/20 1112  GLUCAP 108*    Assessment/Plan: S/P Procedure(s) (LRB): MINIMALLY INVASIVE MAZE PROCEDURE  (N/A) TRANSESOPHAGEAL ECHOCARDIOGRAM (TEE) (N/A) CLIPPING OF ATRIAL APPENDAGE USING ATRICURE ATRICLIP PRO245  1. CV- Chronic recurrent paroxysmal atrial fibrillation. No futher episodes of atrial fibrillation. Will discontinue Amio drip and continue oral.  Holding metoprolol for intolerance.  2. Pulm- tolerating room air with good oxygen saturation. On CPAP for OSA. Chest tubes removed yesterday. Still remains some right lower atelectasis on today's CXR.  Encourage incentive spirometer.  3. Expected post op blood loss anemia-H and H this am stable at9.4and 27.7. Continue Trinsicon 4. Electrolytes improved today after supplementation. 5. Endo- blood glucose well controlled  Plan: Start Xarelto today. Stop Amio drip and continue with PO. Will discharge if okay with Dr. Roxy Manns. Discharge instructions reviewed.     LOS: 5 days    Elgie Collard 04/04/2020   I have seen and examined the patient and agree with the assessment and plan as outlined.  Maintaining NSR.  Continue amiodarone and restart Xarelto.  D/C home today.  Rexene Alberts, MD 04/04/2020 8:32 AM

## 2020-04-04 NOTE — Progress Notes (Signed)
[id number] Pt stated he does not need walker for home. Has been up in room independently. Encouraged IS and walking for pulmonary rehab. Gave heart healthy diet for reference. Encouraged watching sodium. Gave walking instructions for ex. Not interested in CRP 2. Voiced understanding of ed. Stated waiting for wife so he can be discharged. Graylon Good RN BSN 04/04/2020 9:57 AM

## 2020-04-05 ENCOUNTER — Telehealth: Payer: Self-pay

## 2020-04-05 NOTE — Telephone Encounter (Signed)
  Patient was recently discharged from the hospital on 04/04/20.  No TCM completed, patient does not qualify for TCM services due to [employer] coverage.  Per discharge summary patient needs follow up with PCP. No HFU scheduled however a CPE is scheduled for 04/10/20 @ 11:00 AM.

## 2020-04-07 ENCOUNTER — Ambulatory Visit: Payer: BC Managed Care – PPO | Admitting: Internal Medicine

## 2020-04-07 ENCOUNTER — Encounter: Payer: BC Managed Care – PPO | Admitting: Internal Medicine

## 2020-04-07 NOTE — Progress Notes (Unsigned)
Complete physical exam   Patient: Jorge [name]   DOB: Apr 03, 1966   54 y.o. Male  MRN: [id number] Visit Date: 04/10/2020  Today's healthcare provider: Wilhemena Durie, MD   No chief complaint on file.  Subjective    Jorge [name] is a 54 y.o. male who presents today for a complete physical exam.  He reports consuming a {diet types:17450} diet. {Exercise:19826} He generally feels {well/fairly well/poorly:18703}. He reports sleeping {well/fairly well/poorly:18703}. He {does/does not:200015} have additional problems to discuss today.  HPI  ***  Past Medical History:  Diagnosis Date  . Allergic rhinitis, seasonal   . Anticoagulant long-term use managed by cardiology   asa and xarelto  . Bilateral nephrolithiasis    urologist--- dr Junious Silk ([place] office)  . Coronary artery disease cardiologist--- dr Angelena Form   nuclear stress test 10-18-2016 w/ normal perfusion;  cardiac cath 12-23-2017, PCI w/ DES to midLAD and mild RCA/ moderate CFx diease nonobstructive , normal LVF;  ETT 07-13-2019 negative for ischemia  . Dysrhythmia    Afib  . History of kidney stones   . Hyperlipidemia   . Hypertension    followed by pcp and cardiology  . OA (osteoarthritis)    knees  . OSA on CPAP    12-31-2019  per pt uses cpap every night  . PAF (paroxysmal atrial fibrillation) ([place]) 2011   followed by dr Rayann Heman (ep)---  first dx 2011;  s/p DCCV's and ablation x2 in 2015 and last one 03/ 2018, and s/p loop recorder 05/ 2021;   failed multaq, flecainide, and amiodarone  . Pneumonia   . Right knee meniscal tear   . S/P ablation of atrial fibrillation followed by dr allred   x3  last one 2018  . S/P drug eluting coronary stent placement 12/23/2017   x1  to midLAD  . S/P minimally-invasive maze operation for atrial fibrillation 03/30/2020   Complete bilateral atrial lesion set using cryothermy and bipolar radiofrequency ablation with clipping of LA appendage via right mini  thoracotomy approach  . SOB (shortness of breath)    12-31-2019  per pt only when he has atrial fib , otherwise no issues with sob with activities  . Status post placement of implantable loop recorder 09/08/2019   followed by dr Rayann Heman   Past Surgical History:  Procedure Laterality Date  . APPENDECTOMY  1975  . ATRIAL FIBRILLATION ABLATION N/A 11/02/2013   PVI by Dr Rayann Heman  . ATRIAL FIBRILLATION ABLATION N/A 03/29/2014   PVI and CTI by Dr Rayann Heman  . ATRIAL FIBRILLATION ABLATION N/A 07/04/2016   Procedure: Atrial Fibrillation Ablation;  Surgeon: Thompson Grayer, MD;  Location: [facility];  Service: Cardiovascular;  Laterality: N/A;  . CARDIOVERSION N/A 10/15/2013   Procedure: CARDIOVERSION;  Surgeon: Fay Records, MD;  Location[facility] END[facility];  Service: Cardiovascular;  Laterality: N/A;  . CLIPPING OF ATRIAL APPENDAGE  03/30/2020   Procedure: CLIPPING OF ATRIAL APPENDAGE USING ATRICURE ATRICLIP [id number];  Surgeon: Rexene Alberts, MD;  Location: [facility];  Service: Open Heart Surgery;;  . COLONOSCOPY  2016  . CORONARY STENT INTERVENTION N/A 12/23/2017   Procedure: CORONARY STENT INTERVENTION;  Surgeon: Burnell Blanks, MD;  Location: [place] [facility];  Service: Cardiovascular;  Laterality: N/A;  . EXTRACORPOREAL SHOCK WAVE LITHOTRIPSY  2005;  2013  . implantable loop recorder placement  09/08/2019   (done in office)   Medtronic Reveal Linq model LNQ 22 (SN [id number]) implantable loop recorder implanted  in office by Dr Rayann Heman for afib management  . INGUINAL HERNIA REPAIR Left 05/31/2015   Procedure: HERNIA REPAIR INGUINAL ADULT;  Surgeon: Robert Bellow, MD;  Location: [facility];  Service: General;  Laterality: Left;  . KNEE ARTHROSCOPY Left 2009   @[facility]   . KNEE ARTHROSCOPY WITH MEDIAL MENISECTOMY Right 01/04/2020   Procedure: KNEE ARTHROSCOPY WITH PARTIAL MEDIAL MENISECTOMY;  Surgeon: Nicholes Stairs, MD;  Location: [facility];  Service: Orthopedics;  Laterality:  Right;  . MINIMALLY INVASIVE MAZE PROCEDURE N/A 03/30/2020   Procedure: MINIMALLY INVASIVE MAZE PROCEDURE;  Surgeon: Rexene Alberts, MD;  Location: [place];  Service: Open Heart Surgery;  Laterality: N/A;  . RHINOPLASTY  2006    @[facility]   . RIGHT/LEFT HEART CATH AND CORONARY ANGIOGRAPHY N/A 12/23/2017   Procedure: RIGHT/LEFT HEART CATH AND CORONARY ANGIOGRAPHY;  Surgeon: Burnell Blanks, MD;  Location: [place] [facility];  Service: Cardiovascular;  Laterality: N/A;  . RIGHT/LEFT HEART CATH AND CORONARY ANGIOGRAPHY N/A 02/02/2020   Procedure: RIGHT/LEFT HEART CATH AND CORONARY ANGIOGRAPHY;  Surgeon: Burnell Blanks, MD;  Location: [place] [facility];  Service: Cardiovascular;  Laterality: N/A;  . ROBOT ASSISTED INGUINAL HERNIA REPAIR Left 05-01-2013   @[facility]   . TEE WITHOUT CARDIOVERSION N/A 11/01/2013   Procedure: TRANSESOPHAGEAL ECHOCARDIOGRAM (TEE);  Surgeon: Candee Furbish, MD;  Location: [facility] ENDOSCOPY;  Service: Cardiovascular;  Laterality: N/A;  . TEE WITHOUT CARDIOVERSION N/A 03/28/2014   Procedure: TRANSESOPHAGEAL ECHOCARDIOGRAM (TEE);  Surgeon: Larey Dresser, MD;  Location: [place];  Service: Cardiovascular;  Laterality: N/A;  . TEE WITHOUT CARDIOVERSION N/A 02/02/2020   Procedure: TRANSESOPHAGEAL ECHOCARDIOGRAM (TEE);  Surgeon: Werner Lean, MD;  Location: [facility] ENDOSCOPY;  Service: Cardiovascular;  Laterality: N/A;  . TEE WITHOUT CARDIOVERSION N/A 03/30/2020   Procedure: TRANSESOPHAGEAL ECHOCARDIOGRAM (TEE);  Surgeon: Rexene Alberts, MD;  Location: [place];  Service: Open Heart Surgery;  Laterality: N/A;   Social History   Socioeconomic History  . Marital status: Married    Spouse name: Not on file  . Number of children: Not on file  . Years of education: Not on file  . Highest education level: Not on file  Occupational History  . Occupation: Passenger transport manager w/ [employer]  Tobacco Use  . Smoking status: Never Smoker  . Smokeless tobacco: Never Used  Vaping  Use  . Vaping Use: Never used  Substance and Sexual Activity  . Alcohol use: Yes    Alcohol/week: 6.0 - 12.0 standard drinks    Types: 6 - 12 Cans of beer per week    Comment: occasionally  . Drug use: Never  . Sexual activity: Yes  Other Topics Concern  . Not on file  Social History Narrative   Lives in [place] with his spouse.  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[employer]   Social Determinants of Radio broadcast assistant Strain: Not on [employer] Insecurity: Not on file  Transportation Needs: Not on file  Physical Activity: Not on file  Stress: Not on file  Social Connections: Not on file  Intimate Partner Violence: Not on file   Family Status  Relation Name Status  . Mother  Deceased at age 60  . MGF  Deceased at age 55       from MI  . Brother  Alive       stable health, mental health issues  . PGM  Deceased       From CVA  .  Father  Deceased at age 40       suicide  . MGM  Deceased  . Other  (Not Specified)  . PGF  Deceased  . Neg Hx  (Not Specified)   Family History  Problem Relation Age of Onset  . Cancer Mother        breast, cause of death  . Heart attack Maternal Grandfather 53       Cause of death  . Hypertension Maternal Grandfather   . Heart failure Brother   . CVA Paternal Grandmother        cause of death  . Atrial fibrillation Maternal Grandmother   . Colon polyps Other        Fam Hx  . Kidney cancer Neg Hx   . Bladder Cancer Neg Hx   . Prostate cancer Neg Hx    Allergies  Allergen Reactions  . Hydrocodone Nausea And Vomiting  . Oxycodone Nausea And Vomiting    Patient Care Team: Jerrol Banana., MD as PCP - General (Family Medicine) Thompson Grayer, MD as PCP - Electrophysiology (Cardiology) Burnell Blanks, MD as PCP - Cardiology (Cardiology) Jerrol Banana., MD (Family Medicine) Bary Castilla Forest Gleason, MD (General Surgery)   Medications: Outpatient Medications Prior to Visit  Medication Sig  .  amiodarone (PACERONE) 400 MG tablet Take 1 tablet (400 mg total) by mouth 2 (two) times daily. For 7 days, then once daily  . aspirin 81 MG EC tablet Take 81 mg by mouth daily.   Marland Kitchen aspirin-acetaminophen-caffeine (EXCEDRIN MIGRAINE) 250-250-65 MG tablet Take 2 tablets by mouth every 6 (six) hours as needed for headache.   Marland Kitchen azelastine (ASTELIN) 0.1 % nasal spray Place 1 spray into both nostrils 2 (two) times daily. (Patient taking differently: Place 1 spray into both nostrils 2 (two) times daily as needed for allergies. )  . ferrous [id number] C-folic acid (TRINSICON / FOLTRIN) capsule Take 1 capsule by mouth 2 (two) times daily after a meal.  . fluticasone (FLONASE) 50 MCG/ACT nasal spray Place 2 sprays into both nostrils daily.  Marland Kitchen ibuprofen (ADVIL) 200 MG tablet Take 800 mg by mouth every 6 (six) hours as needed for headache or moderate pain.  . montelukast (SINGULAIR) 10 MG tablet Take 1 tablet (10 mg total) by mouth at bedtime.  . rosuvastatin (CRESTOR) 40 MG tablet Take 1 tablet (40 mg total) by mouth daily. (Patient taking differently: Take 40 mg by mouth every evening. )  . tamsulosin (FLOMAX) 0.4 MG CAPS capsule Take 1 capsule (0.4 mg total) by mouth daily after supper.  . traMADol (ULTRAM) 50 MG tablet Take 1-2 tablets (50-100 mg total) by mouth every 4 (four) hours as needed for moderate pain.  Marland Kitchen XARELTO 20 MG TABS tablet Take 1 tablet (20 mg total) by mouth daily with supper. (Patient taking differently: Take 20 mg by mouth daily with supper. )   No facility-administered medications prior to visit.    Review of Systems  All other systems reviewed and are negative.   {Heme  Chem  Endocrine  Serology  Results Review (optional):23779::" "}  Objective    There were no vitals taken for this visit. {Show previous vital signs (optional):23777::" "}  Physical Exam  ***  Last depression screening scores PHQ 2/9 Scores 04/06/2019 07/30/2018 06/29/2018  PHQ - 2 Score 0 0 0   PHQ- 9 Score 0 - -   Last fall risk screening Fall Risk  04/06/2019  Falls in the past year?  0  Number falls in past yr: 0  Injury with Fall? 0  Follow up Falls evaluation completed   Last Audit-C alcohol use screening Alcohol Use Disorder Test (AUDIT) 04/06/2019  1. How often do you have a drink containing alcohol? 4  2. How many drinks containing alcohol do you have on a typical day when you are drinking? 1  3. How often do you have six or more drinks on one occasion? 1  AUDIT-C Score 6  4. How often during the last year have you found that you were not able to stop drinking once you had started? 0  5. How often during the last year have you failed to do what was normally expected from you because of drinking? 0  6. How often during the last year have you needed a first drink in the morning to get yourself going after a heavy drinking session? 0  7. How often during the last year have you had a feeling of guilt of remorse after drinking? 0  8. How often during the last year have you been unable to remember what happened the night before because you had been drinking? 0  9. Have you or someone else been injured as a result of your drinking? 0  10. Has a relative or friend or a doctor or another health worker been concerned about your drinking or suggested you cut down? 0  Alcohol Use Disorder Identification Test Final Score (AUDIT) 6  Alcohol Brief Interventions/Follow-up AUDIT Score <7 follow-up not indicated   A score of 3 or more in women, and 4 or more in men indicates increased risk for alcohol abuse, EXCEPT if all of the points are from question 1   No results found for any visits on 04/10/20.  Assessment & Plan    Routine Health Maintenance and Physical Exam  Exercise Activities and Dietary recommendations Goals   None     Immunization History  Administered Date(s) Administered  . Influenza,inj,Quad PF,6+ Mos 03/13/2016, 03/26/2017, 04/07/2018, 04/06/2019  . PFIZER  SARS-COV-2 Vaccination 05/04/2019, 05/25/2019  . Tdap 02/09/2013, 04/22/2018    Health Maintenance  Topic Date Due  . Hepatitis C Screening  Never done  . HIV Screening  Never done  . COVID-19 Vaccine (3 - Booster for Pfizer series) 11/22/2019  . INFLUENZA VACCINE  11/28/2019  . COLONOSCOPY  01/18/2025  . TETANUS/TDAP  04/22/2028    Discussed health benefits of physical activity, and encouraged him to engage in regular exercise appropriate for his age and condition.  ***  No follow-ups on file.     {provider attestation***:1}   Wilhemena Durie, MD  [facility] [phone] (phone) [phone] (fax)  [place]

## 2020-04-10 ENCOUNTER — Ambulatory Visit: Payer: Self-pay | Admitting: *Deleted

## 2020-04-10 ENCOUNTER — Telehealth: Payer: Self-pay | Admitting: *Deleted

## 2020-04-10 ENCOUNTER — Ambulatory Visit
Admission: EM | Admit: 2020-04-10 | Discharge: 2020-04-10 | Disposition: A | Discharge status: Home / Self Care | Payer: BC Managed Care – PPO | Attending: Family Medicine | Admitting: Family Medicine

## 2020-04-10 ENCOUNTER — Telehealth: Payer: Self-pay

## 2020-04-10 ENCOUNTER — Other Ambulatory Visit: Payer: Self-pay | Admitting: Family Medicine

## 2020-04-10 ENCOUNTER — Ambulatory Visit (INDEPENDENT_AMBULATORY_CARE_PROVIDER_SITE_OTHER): Payer: BC Managed Care – PPO

## 2020-04-10 ENCOUNTER — Encounter: Payer: BC Managed Care – PPO | Admitting: Family Medicine

## 2020-04-10 ENCOUNTER — Other Ambulatory Visit: Payer: Self-pay

## 2020-04-10 DIAGNOSIS — J918 Pleural effusion in other conditions classified elsewhere: Secondary | ICD-10-CM | POA: Diagnosis not present

## 2020-04-10 DIAGNOSIS — R0602 Shortness of breath: Secondary | ICD-10-CM

## 2020-04-10 DIAGNOSIS — E785 Hyperlipidemia, unspecified: Secondary | ICD-10-CM

## 2020-04-10 DIAGNOSIS — I480 Paroxysmal atrial fibrillation: Secondary | ICD-10-CM

## 2020-04-10 DIAGNOSIS — Z1389 Encounter for screening for other disorder: Secondary | ICD-10-CM

## 2020-04-10 DIAGNOSIS — Z125 Encounter for screening for malignant neoplasm of prostate: Secondary | ICD-10-CM

## 2020-04-10 DIAGNOSIS — Z0000 Encounter for general adult medical examination without abnormal findings: Secondary | ICD-10-CM

## 2020-04-10 DIAGNOSIS — I10 Essential (primary) hypertension: Secondary | ICD-10-CM

## 2020-04-10 MED ORDER — AMOXICILLIN-POT CLAVULANATE 875-125 MG PO TABS: 1.0000 | ORAL_TABLET | Freq: Two times a day (BID) | ORAL | 0 refills | Status: DC

## 2020-04-10 NOTE — ED Provider Notes (Signed)
Jorge [name]    CSN: [id number] Arrival date & time: 04/10/20  1023      History   Chief Complaint Chief Complaint  Patient presents with  . Nasal Congestion  . Cough  . lethargic    HPI Jorge [name] is a 54 y.o. male.   Patient is a 54 year old male with past medical history of hyperlipidemia, hypertension, A. fib, OSA, CAD, pneumonia, ablation, coronary stent placement. Patient recent postop for Maze procedure x10 days ago.  He is here today with congestion, cough, shortness of breath x2 days.  He has been very lethargic.  Coughing up blood-tinged sputum.  Oxygen saturations around 86% initially here.  Seems to improve with sitting up straight into the low 90s.  Mildly dyspneic.  Sent here to rule out pneumonia.  No specific fevers, chills, body aches or night sweats.  No calf pain or swelling.     Past Medical History:  Diagnosis Date  . Allergic rhinitis, seasonal   . Anticoagulant long-term use managed by cardiology   asa and xarelto  . Bilateral nephrolithiasis    urologist--- dr Junious Silk ([facility])  . Coronary artery disease cardiologist--- dr Angelena Form   nuclear stress test 10-18-2016 w/ normal perfusion;  cardiac cath 12-23-2017, PCI w/ DES to midLAD and mild RCA/ moderate CFx diease nonobstructive , normal LVF;  ETT 07-13-2019 negative for ischemia  . Dysrhythmia    Afib  . History of kidney stones   . Hyperlipidemia   . Hypertension    followed by pcp and cardiology  . OA (osteoarthritis)    knees  . OSA on CPAP    12-31-2019  per pt uses cpap every night  . PAF (paroxysmal atrial fibrillation) ([place]) 2011   followed by dr Rayann Heman (ep)---  first dx 2011;  s/p DCCV's and ablation x2 in 2015 and last one 03/ 2018, and s/p loop recorder 05/ 2021;   failed multaq, flecainide, and amiodarone  . Pneumonia   . Right knee meniscal tear   . S/P ablation of atrial fibrillation followed by dr allred   x3  last one 2018  . S/P drug eluting  coronary stent placement 12/23/2017   x1  to midLAD  . S/P minimally-invasive maze operation for atrial fibrillation 03/30/2020   Complete bilateral atrial lesion set using cryothermy and bipolar radiofrequency ablation with clipping of LA appendage via right mini thoracotomy approach  . SOB (shortness of breath)    12-31-2019  per pt only when he has atrial fib , otherwise no issues with sob with activities  . Status post placement of implantable loop recorder 09/08/2019   followed by dr allred    Patient Active Problem List   Diagnosis Date Noted  . S/P minimally-invasive maze operation for atrial fibrillation 03/30/2020  . Coronary artery disease involving native coronary artery of native heart without angina pectoris 07/31/2018  . OSA (obstructive sleep apnea) 04/06/2018  . A-fib ([place]) 07/04/2016  . Allergic rhinitis 03/08/2015  . HLD (hyperlipidemia) 03/08/2015  . Atrial fibrillation with rapid ventricular response ([place]) 07/04/2014  . Chronic anticoagulation - Xarelto   . Paroxysmal atrial fibrillation ([place]) 03/29/2014  . Essential hypertension 12/15/2013  . Atrial fibrillation ([place]) 06/13/2013    Past Surgical History:  Procedure Laterality Date  . APPENDECTOMY  1975  . ATRIAL FIBRILLATION ABLATION N/A 11/02/2013   PVI by Dr Rayann Heman  . ATRIAL FIBRILLATION ABLATION N/A 03/29/2014   PVI and CTI by Dr Rayann Heman  . ATRIAL FIBRILLATION ABLATION  N/A 07/04/2016   Procedure: Atrial Fibrillation Ablation;  Surgeon: Thompson Grayer, MD;  Location: [place] [facility];  Service: Cardiovascular;  Laterality: N/A;  . CARDIOVERSION N/A 10/15/2013   Procedure: CARDIOVERSION;  Surgeon: Fay Records, MD;  Location: [facility]OSCOPY;  Service: Cardiovascular;  Laterality: N/A;  . CLIPPING OF ATRIAL APPENDAGE  03/30/2020   Procedure: CLIPPING OF ATRIAL APPENDAGE USING ATRICURE ATRICLIP [id number];  Surgeon: Rexene Alberts, MD;  Location: [place];  Service: Open Heart Surgery;;  . COLONOSCOPY  2016  . CORONARY  STENT INTERVENTION N/A 12/23/2017   Procedure: CORONARY STENT INTERVENTION;  Surgeon: Burnell Blanks, MD;  Location: [place] [facility];  Service: Cardiovascular;  Laterality: N/A;  . EXTRACORPOREAL SHOCK WAVE LITHOTRIPSY  2005;  2013  . implantable loop recorder placement  09/08/2019   (done in office)   Medtronic Reveal Linq model LNQ 22 (SN [id number]) implantable loop recorder implanted in office by Dr Rayann Heman for afib management  . INGUINAL HERNIA REPAIR Left 05/31/2015   Procedure: HERNIA REPAIR INGUINAL ADULT;  Surgeon: Robert Bellow, MD;  Location: [facility];  Service: General;  Laterality: Left;  . KNEE ARTHROSCOPY Left 2009   @[facility]   . KNEE ARTHROSCOPY WITH MEDIAL MENISECTOMY Right 01/04/2020   Procedure: KNEE ARTHROSCOPY WITH PARTIAL MEDIAL MENISECTOMY;  Surgeon: Nicholes Stairs, MD;  Location: [facility];  Service: Orthopedics;  Laterality: Right;  . MINIMALLY INVASIVE MAZE PROCEDURE N/A 03/30/2020   Procedure: MINIMALLY INVASIVE MAZE PROCEDURE;  Surgeon: Rexene Alberts, MD;  Location: [facility];  Service: Open Heart Surgery;  Laterality: N/A;  . RHINOPLASTY  2006    @[facility]   . RIGHT/LEFT HEART CATH AND CORONARY ANGIOGRAPHY N/A 12/23/2017   Procedure: RIGHT/LEFT HEART CATH AND CORONARY ANGIOGRAPHY;  Surgeon: Burnell Blanks, MD;  Location: [place] [facility];  Service: Cardiovascular;  Laterality: N/A;  . RIGHT/LEFT HEART CATH AND CORONARY ANGIOGRAPHY N/A 02/02/2020   Procedure: RIGHT/LEFT HEART CATH AND CORONARY ANGIOGRAPHY;  Surgeon: Burnell Blanks, MD;  Location: [place] [facility];  Service: Cardiovascular;  Laterality: N/A;  . ROBOT ASSISTED INGUINAL HERNIA REPAIR Left 05-01-2013   @[facility]   . TEE WITHOUT CARDIOVERSION N/A 11/01/2013   Procedure: TRANSESOPHAGEAL ECHOCARDIOGRAM (TEE);  Surgeon: Candee Furbish, MD;  Location: [facility] ENDOSCOPY;  Service: Cardiovascular;  Laterality: N/A;  . TEE WITHOUT CARDIOVERSION N/A 03/28/2014   Procedure:  TRANSESOPHAGEAL ECHOCARDIOGRAM (TEE);  Surgeon: Larey Dresser, MD;  Location: [facility];  Service: Cardiovascular;  Laterality: N/A;  . TEE WITHOUT CARDIOVERSION N/A 02/02/2020   Procedure: TRANSESOPHAGEAL ECHOCARDIOGRAM (TEE);  Surgeon: Werner Lean, MD;  Location[facility] ENDOSCOPY;  Service: Cardiovascular;  Laterality: N/A;  . TEE WITHOUT CARDIOVERSION N/A 03/30/2020   Procedure: TRANSESOPHAGEAL ECHOCARDIOGRAM (TEE);  Surgeon: Rexene Alberts, MD;  Location: [place];  Service: Open Heart Surgery;  Laterality: N/A;       Home Medications    Prior to Admission medications   Medication Sig Start Date End Date Taking? Authorizing Provider  amiodarone (PACERONE) 400 MG tablet Take 1 tablet (400 mg total) by mouth 2 (two) times daily. For 7 days, then once daily 04/04/20   Gold, Patrick Jupiter E, PA-C  amoxicillin-clavulanate (AUGMENTIN) 875-125 MG tablet Take 1 tablet by mouth every 12 (twelve) hours. 04/10/20   Loura Halt A, NP  aspirin 81 MG EC tablet Take 81 mg by mouth daily.  03/05/11   [provider]  aspirin-acetaminophen-caffeine (EXCEDRIN MIGRAINE) [id number] MG tablet Take 2 tablets by mouth every 6 (six)  hours as needed for headache.     [provider]  azelastine (ASTELIN) 0.1 % nasal spray Place 1 spray into both nostrils 2 (two) times daily. Patient taking differently: Place 1 spray into both nostrils 2 (two) times daily as needed for allergies. 12/16/19   Jerrol Banana., MD  ferrous [id number] C-folic acid (TRINSICON / FOLTRIN) capsule Take 1 capsule by mouth 2 (two) times daily after a meal. 04/04/20   Gold, Wayne E, PA-C  fluticasone (FLONASE) 50 MCG/ACT nasal spray Place 2 sprays into both nostrils daily. 12/16/19   Jerrol Banana., MD  ibuprofen (ADVIL) 200 MG tablet Take 800 mg by mouth every 6 (six) hours as needed for headache or moderate pain.    [provider]  montelukast (SINGULAIR) 10 MG tablet Take 1 tablet (10 mg  total) by mouth at bedtime. 12/16/19   Jerrol Banana., MD  rosuvastatin (CRESTOR) 40 MG tablet Take 1 tablet (40 mg total) by mouth daily. Patient taking differently: Take 40 mg by mouth every evening.  07/16/19   Jerrol Banana., MD  tamsulosin (FLOMAX) 0.4 MG CAPS capsule Take 1 capsule (0.4 mg total) by mouth daily after supper. 12/17/19   Festus Aloe, MD  traMADol (ULTRAM) 50 MG tablet Take 1-2 tablets (50-100 mg total) by mouth every 4 (four) hours as needed for moderate pain. 04/04/20   Gold, Wayne E, PA-C  XARELTO 20 MG TABS tablet Take 1 tablet (20 mg total) by mouth daily with supper. Patient taking differently: Take 20 mg by mouth daily with supper.  11/22/19   Thompson Grayer, MD    Family History Family History  Problem Relation Age of Onset  . Cancer Mother        breast, cause of death  . Heart attack Maternal Grandfather 53       Cause of death  . Hypertension Maternal Grandfather   . Heart failure Brother   . CVA Paternal Grandmother        cause of death  . Atrial fibrillation Maternal Grandmother   . Colon polyps Other        Fam Hx  . Kidney cancer Neg Hx   . Bladder Cancer Neg Hx   . Prostate cancer Neg Hx     Social History Social History   Tobacco Use  . Smoking status: Never Smoker  . Smokeless tobacco: Never Used  Vaping Use  . Vaping Use: Never used  Substance Use Topics  . Alcohol use: Yes    Alcohol/week: 2.0 - 3.0 standard drinks    Types: 2 - 3 Cans of beer per week    Comment: occasionally  . Drug use: Never     Allergies   Hydrocodone and Oxycodone   Review of Systems Review of Systems   Physical Exam Triage Vital Signs ED Triage Vitals  Enc Vitals Group     BP 04/10/20 1028 (!) 148/81     Pulse Rate 04/10/20 1028 97     Resp 04/10/20 1028 (!) 24     Temp 04/10/20 1028 98.6 F (37 C)     Temp Source 04/10/20 1028 Oral     SpO2 04/10/20 1028 91 %     Weight 04/10/20 1054 200 lb (90.7 kg)     Height 04/10/20  1054 5\' 10"  (1.778 m)     Head Circumference --      Peak Flow --      Pain Score 04/10/20 1053  3     Pain Loc --      Pain Edu? --      Excl. in [place]? --    No data found.  Updated Vital Signs BP (!) 148/81 (BP Location: Left Arm)   Pulse 97   Temp 98.6 F (37 C) (Oral)   Resp (!) 24   Ht 5\' 10"  (1.778 m)   Wt 200 lb (90.7 kg)   SpO2 91%   BMI 28.70 kg/m   Visual Acuity Right Eye Distance:   Left Eye Distance:   Bilateral Distance:    Right Eye Near:   Left Eye Near:    Bilateral Near:     Physical Exam Constitutional:      General: He is not in acute distress.    Appearance: He is ill-appearing. He is not toxic-appearing or diaphoretic.  HENT:     Head: Normocephalic and atraumatic.     Nose: Nose normal.  Eyes:     Conjunctiva/sclera: Conjunctivae normal.  Cardiovascular:     Rate and Rhythm: Normal rate and regular rhythm.  Pulmonary:     Effort: Pulmonary effort is normal.     Breath sounds: Normal breath sounds.  Skin:    General: Skin is warm and dry.  Neurological:     Mental Status: He is alert.      UC Treatments / Results  Labs (all labs ordered are listed, but only abnormal results are displayed) Labs Reviewed - No data to display  EKG   Radiology DG Chest 2 View  Result Date: 04/10/2020 CLINICAL DATA:  54 year old male status post cardiac surgery, Maze procedure on 03/30/2020. Persistent cough and congestion. EXAM: CHEST - 2 VIEW COMPARISON:  Chest radiographs 04/04/2020 and earlier. FINDINGS: Trace right pneumothorax no longer visible. Small right pleural effusion persists. Decreased but not resolved additional streaky opacity in the right lung. No consolidation. No pulmonary edema suspected. Stable cardiac size and mediastinal contours. Prior cardiac surgery and anterior left chest wall loop recorder. Visualized tracheal air column is within normal limits. No acute osseous abnormality identified. Negative visible bowel gas pattern.  IMPRESSION: 1. Resolved trace right pneumothorax. Continued small right pleural effusion and streaky opacity right lung opacity which more resembles atelectasis than infection. 2. Left lung remains clear. Electronically Signed   By: Genevie Ann M.D.   On: 04/10/2020 11:05    Procedures Procedures (including critical care time)  Medications Ordered in UC Medications - No data to display  Initial Impression / Assessment and Plan / UC Course  I have reviewed the triage vital signs and the nursing notes.  Pertinent labs & imaging results that were available during my care of the patient were reviewed by me and considered in my medical decision making (see chart for details).     Shortness of breath 54 year old male with recent Maze procedure approximate 10 days ago. Acute onset shortness of breath, cough, lethargy x2 days ago. There is some concern for pulmonary embolism.  Patient sent here for rule out pneumonia.  X-ray with resolved trace right pneumothorax continued small right pleural effusion and streaky opacity right lung opacity which more resembles atelectasis than infection. EKG with normal sinus rhythm and normal rate.  Pt spoke with cardiologist office over the phone and he is waiting for call back. He does not want to go to the ER at this time.  Will go ahead and treat for underlying infection at this time, possible pneumonia as requested.  Spoke to patient about  my concern for PE and need for CT chest. Recommended go to the ER or see if he can get the CT ordered outpatient. He would like to wait on cardiologist advice.  Some concern also about patients O2 sats dropping into the high 80s. When he would sit up they would normalize.  Strict ER precautions given.  Final Clinical Impressions(s) / UC Diagnoses   Final diagnoses:  SOB (shortness of breath)     Discharge Instructions     We will go ahead and prescribe you antibiotics as requested this time. Recommended for any  worsening symptoms to please go straight to the ER. There is still some concern for pulmonary embolism as we talked about.  Otherwise follow-up with your cardiologist as planned.    ED Prescriptions    Medication Sig Dispense Auth. Provider   amoxicillin-clavulanate (AUGMENTIN) 875-125 MG tablet Take 1 tablet by mouth every 12 (twelve) hours. 14 tablet Arbie Blankley A, NP     PDMP not reviewed this encounter.   Orvan July, NP 04/10/20 1213

## 2020-04-10 NOTE — Telephone Encounter (Signed)
Mr. Goynes called stating he has been doing well s/p Mini Maze 03/30/20 but has become increasingly SOB over the weekend with an increase in sinus pressure. Pt states he has bloody discharge coming from his nasal passage. Mr. Piscitello states at times he struggles to breath and is particularly sore on the right side of his chest. Per PA on call, Jadene Pierini, it is best if pt goes to an urgent care or ED for further eval. Pt informed of this and acknowledges receipt.

## 2020-04-10 NOTE — Telephone Encounter (Signed)
Called patient back to get update on how he was feeling.  He stated that he took a really long nap, and was feeling much better.  Some shortness of breath, but not bad.  He did state that he was given an antibiotic for his sinus infection and chest xray did should small right pleural effusion that is unchanged since last chest xray.  Advised that he could lay on pillows while sleeping to help with shortness of breath. Advised to give the office a call back if his breathing does not resolve over time and exercise.  Also, advised to contact 911 or go to the nearest emergency room if he begins to have severe shortness of breath and/ or chest pain.  He acknowledged receipt and was thankful of the return call.

## 2020-04-10 NOTE — Telephone Encounter (Signed)
-----   Message from Rexene Alberts, MD sent at 04/10/2020 12:13 PM EST ----- Regarding: RE: SOB I'm not going to order one without seeing him in the office first.   If they think it's indicated they should order it.  He is already anticoagulated.  You are welcome to get him to come here and see a PA if desired  ----- Message ----- From: Donnella Sham, RN Sent: 04/10/2020  11:54 AM EST To: Rexene Alberts, MD Subject: SOB                                            Hey,  Patient called this morning concerned about increased shortness of breath and sinus congestion.  Per [facility] advice, patient told to go to an urgent care for sinus related problems.  Patient currently in the Urgent care in [place].  They did do a chest xray, which shows a small right pleural effusion (which is not new compared to last chest xray) and said that he does have a sinus infection, which they are medicating him for.  The NP that he saw said that he should get a chest CT for the shortness of breath, to rule out PE, but told him that he would need to get his ordered by his physician because they could not do that.  Do you want him to get a CT?  He states that he is some short of breath, while up and active, but not severe and no chest pain.    Please advise,  Caryl Pina

## 2020-04-10 NOTE — Telephone Encounter (Signed)
Patient calls in after speaking with his cardiology office regarding increased SOB over the last 3 days-he is post-op open heart surgery from 03/30/20. Coughed up bloody mucous today, chest and shoulders hurt.  Cards advised UC for xray. Informed the patient he should go to UC as advised by cardiology. Patient stated he would go now.

## 2020-04-10 NOTE — ED Triage Notes (Signed)
Pt presents to Urgent Care with c/o nasal congestion and cough since late November. Finished course of amoxicillin on 03/29/20 prior to having MAZE procedure on 03/30/20. Pt reports symptoms subsided significantly but worsened 2 days ago. Also c/o lethargy. Pt w/ no known COVID exposure; pt is vaccinated.

## 2020-04-10 NOTE — Discharge Instructions (Signed)
We will go ahead and prescribe you antibiotics as requested this time. Recommended for any worsening symptoms to please go straight to the ER. There is still some concern for pulmonary embolism as we talked about.  Otherwise follow-up with your cardiologist as planned.

## 2020-04-11 ENCOUNTER — Encounter (HOSPITAL_COMMUNITY): Payer: Self-pay | Admitting: Thoracic Surgery (Cardiothoracic Vascular Surgery)

## 2020-04-14 ENCOUNTER — Other Ambulatory Visit: Payer: Self-pay | Admitting: Thoracic Surgery (Cardiothoracic Vascular Surgery)

## 2020-04-14 DIAGNOSIS — Z8679 Personal history of other diseases of the circulatory system: Secondary | ICD-10-CM

## 2020-04-17 ENCOUNTER — Ambulatory Visit: Payer: BC Managed Care – PPO | Admitting: Physician Assistant

## 2020-04-24 ENCOUNTER — Ambulatory Visit
Admission: RE | Admit: 2020-04-24 | Discharge: 2020-04-24 | Disposition: A | Discharge status: Home / Self Care | Payer: BC Managed Care – PPO | Source: Ambulatory Visit | Attending: Thoracic Surgery (Cardiothoracic Vascular Surgery) | Admitting: Thoracic Surgery (Cardiothoracic Vascular Surgery)

## 2020-04-24 ENCOUNTER — Other Ambulatory Visit: Payer: Self-pay

## 2020-04-24 ENCOUNTER — Encounter: Payer: Self-pay | Admitting: Physician Assistant

## 2020-04-24 ENCOUNTER — Ambulatory Visit (INDEPENDENT_AMBULATORY_CARE_PROVIDER_SITE_OTHER): Payer: Self-pay | Admitting: Physician Assistant

## 2020-04-24 VITALS — BP 150/90 | HR 90 | Temp 97.8°F | Resp 20 | Ht 70.0 in | Wt 189.0 lb

## 2020-04-24 DIAGNOSIS — Z8679 Personal history of other diseases of the circulatory system: Secondary | ICD-10-CM

## 2020-04-24 DIAGNOSIS — I4891 Unspecified atrial fibrillation: Secondary | ICD-10-CM

## 2020-04-24 DIAGNOSIS — Z9889 Other specified postprocedural states: Secondary | ICD-10-CM

## 2020-04-24 NOTE — Progress Notes (Signed)
HPI:  Patient returns for routine postoperative follow-up having undergone a minimally invasive complete bilateral atrial lesion set using cryothermy and bipolar radiofrequency ablationand LA clip by Dr. Cornelius Moras on 03/31/2020. Since hospital discharge the patient reports, he had shortness of breath, lethargy, and increased sinus pressure. He went to urgent care and was treated for a sinus infection (NOT pneumonia) and given antibiotic (Augmentin) . Also, practitioner had concern for PE and recommended going to ED (to get CT). Ultimately, patient did not want to go to ED because of COVID. He is no longer short of breath and sinusitis has resolved. He still has some soreness over right pectoralis muscle but is decreasing. He is walking up to one mile now.   Current Outpatient Medications  Medication Sig Dispense Refill  . amiodarone (PACERONE) 400 MG tablet Take 1 tablet (400 mg total) by mouth 2 (two) times daily. For 7 days, then once daily 41 tablet 1  . amoxicillin-clavulanate (AUGMENTIN) 875-125 MG tablet Take 1 tablet by mouth every 12 (twelve) hours. 14 tablet 0  . aspirin 81 MG EC tablet Take 81 mg by mouth daily.     Marland Kitchen aspirin-acetaminophen-caffeine (EXCEDRIN MIGRAINE) 250-250-65 MG tablet Take 2 tablets by mouth every 6 (six) hours as needed for headache.     Marland Kitchen azelastine (ASTELIN) 0.1 % nasal spray Place 1 spray into both nostrils 2 (two) times daily. (Patient taking differently: Place 1 spray into both nostrils 2 (two) times daily as needed for allergies.) 30 mL 12  . ferrous fumarate-b12-vitamic C-folic acid (TRINSICON / FOLTRIN) capsule Take 1 capsule by mouth 2 (two) times daily after a meal. 60 capsule 1  . fluticasone (FLONASE) 50 MCG/ACT nasal spray Place 2 sprays into both nostrils daily. 16 g 3  . ibuprofen (ADVIL) 200 MG tablet Take 800 mg by mouth every 6 (six) hours as needed for headache or moderate pain.    . montelukast (SINGULAIR) 10 MG tablet Take 1 tablet (10 mg total) by  mouth at bedtime. 90 tablet 3  . rosuvastatin (CRESTOR) 40 MG tablet Take 1 tablet (40 mg total) by mouth daily. 90 tablet 0  . tamsulosin (FLOMAX) 0.4 MG CAPS capsule Take 1 capsule (0.4 mg total) by mouth daily after supper. 30 capsule 3  . traMADol (ULTRAM) 50 MG tablet Take 1-2 tablets (50-100 mg total) by mouth every 4 (four) hours as needed for moderate pain. 28 tablet 0  . XARELTO 20 MG TABS tablet Take 1 tablet (20 mg total) by mouth daily with supper. (Patient taking differently: Take 20 mg by mouth daily with supper. ) 90 tablet 1  Vital Signs: Temp 97.8, BP 150/90, HR 90, RR 20, Oxygenation 100% on room air.   Physical Exam: CV-RRR Pulmonary-Clear to auscultation bilaterally Extremities-No LE edmea Wounds- 2 Silk sutures removed. Lower chest tube wound with eschar and superficially dehisced but NO sign of infection. Wound with derma bond and is healing without sign of infection.   Diagnostic Tests:   CLINICAL DATA:  Post Maze procedure  EXAM: CHEST - 2 VIEW  COMPARISON:  04/10/2020  FINDINGS: Improved aeration at the right lung base with small residual pleural effusion and mild basilar atelectasis. Stable cardiomediastinal contours. Atrial appendage clip and loop recorder are again identified. No pneumothorax. No acute osseous abnormality.  IMPRESSION: Improved aeration at the right lung base with small residual pleural effusion and mild basilar atelectasis.   Electronically Signed   By: Guadlupe Spanish M.D.   On: 04/24/2020 12:39  Impression and Plan: Rhythm strip done shows SR. He was instructed to continue with Amiodarone 400 mg daily. He is hypertensive on this visit;if he is hypertensive at next office visit, will consider restarting  Losartan. He has an appointment to see Dr. Johney Frame on 05/01/2020. He was instructed to cleanse chest tube wound with soap and water (no betadine, Neo Sporin, Vasoline) and apply band aid or dry 2x2s with tape. He was  instructed to contact the office if develops increased drainage or erythema. He is not taking any narcotic for pain so he was instructed he may begin driving short distances (30 minutes or less) and gradually increase his frequency and duration. He will return to see Dr. Cornelius Moras in 4-6 weeks.   Ardelle Balls, PA-C [facility] [phone]

## 2020-04-24 NOTE — Patient Instructions (Signed)
He is not taking any narcotic for pain. He was instructed he may drive short distances He was instructed to cleanse chest tube wound with soap and water. Apply band aid or dry 2x2 with tape and change daily. If he develops increased drainage or redness, call office to be seen

## 2020-05-01 ENCOUNTER — Ambulatory Visit (INDEPENDENT_AMBULATORY_CARE_PROVIDER_SITE_OTHER): Payer: BC Managed Care – PPO

## 2020-05-01 ENCOUNTER — Ambulatory Visit: Payer: BC Managed Care – PPO | Admitting: Internal Medicine

## 2020-05-01 ENCOUNTER — Other Ambulatory Visit: Payer: Self-pay

## 2020-05-01 ENCOUNTER — Encounter: Payer: Self-pay | Admitting: Internal Medicine

## 2020-05-01 VITALS — BP 142/82 | HR 93 | Ht 70.0 in | Wt 193.6 lb

## 2020-05-01 DIAGNOSIS — I2510 Atherosclerotic heart disease of native coronary artery without angina pectoris: Secondary | ICD-10-CM

## 2020-05-01 DIAGNOSIS — D6869 Other thrombophilia: Secondary | ICD-10-CM

## 2020-05-01 DIAGNOSIS — I4819 Other persistent atrial fibrillation: Secondary | ICD-10-CM

## 2020-05-01 MED ORDER — AMIODARONE HCL 200 MG PO TABS: ORAL_TABLET | ORAL | 3 refills | Status: DC

## 2020-05-01 NOTE — Patient Instructions (Addendum)
Medication Instructions:  Reduce your Amiodarone to 200 mg daily, in 8 weeks reduce to 100 mg daily.   *If you need a refill on your cardiac medications before your next appointment, please call your pharmacy*  Lab Work: None ordered.  If you have labs (blood work) drawn today and your tests are completely normal, you will receive your results only by: Marland Kitchen MyChart Message (if you have MyChart) OR . A paper copy in the mail If you have any lab test that is abnormal or we need to change your treatment, we will call you to review the results.  Testing/Procedures: None ordered.  Follow-Up: At [facility], you and your health needs are our priority.  As part of our continuing mission to provide you with exceptional heart care, we have created designated Provider Care Teams.  These Care Teams include your primary Cardiologist (physician) and Advanced Practice Providers (APPs -  Physician Assistants and Nurse Practitioners) who all work together to provide you with the care you need, when you need it.  We recommend signing up for the patient portal called "MyChart".  Sign up information is provided on this After Visit Summary.  MyChart is used to connect with patients for Virtual Visits (Telemedicine).  Patients are able to view lab/test results, encounter notes, upcoming appointments, etc.  Non-urgent messages can be sent to your provider as well.   To learn more about what you can do with MyChart, go to [email or web address].    Your next appointment:   Your physician wants you to follow-up in: 07/31/20 at 3:15 pm with Dr. Johney Frame.     Other Instructions:

## 2020-05-01 NOTE — Progress Notes (Signed)
PCP: Jerrol Banana., MD Primary Cardiologist: Dr Angelena Form Primary EP: Dr Rayann Heman  Jorge [name] is a 54 y.o. male who presents today for routine electrophysiology followup.  Since his recent MAZE, the patient reports doing very well.  No afib.  Making good but slow recovery. Today, he denies symptoms of palpitations, chest pain, shortness of breath,  lower extremity edema, dizziness, presyncope, or syncope.  The patient is otherwise without complaint today.   Past Medical History:  Diagnosis Date  . Allergic rhinitis, seasonal   . Anticoagulant long-term use managed by cardiology   asa and xarelto  . Bilateral nephrolithiasis    urologist--- dr Junious Silk ([place] office)  . Coronary artery disease cardiologist--- dr Angelena Form   nuclear stress test 10-18-2016 w/ normal perfusion;  cardiac cath 12-23-2017, PCI w/ DES to midLAD and mild RCA/ moderate CFx diease nonobstructive , normal LVF;  ETT 07-13-2019 negative for ischemia  . Dysrhythmia    Afib  . History of kidney stones   . Hyperlipidemia   . Hypertension    followed by pcp and cardiology  . OA (osteoarthritis)    knees  . OSA on CPAP    12-31-2019  per pt uses cpap every night  . PAF (paroxysmal atrial fibrillation) ([place]) 2011   followed by dr Rayann Heman (ep)---  first dx 2011;  s/p DCCV's and ablation x2 in 2015 and last one 03/ 2018, and s/p loop recorder 05/ 2021;   failed multaq, flecainide, and amiodarone  . Pneumonia   . Right knee meniscal tear   . S/P ablation of atrial fibrillation followed by dr Angelina Venard   x3  last one 2018  . S/P drug eluting coronary stent placement 12/23/2017   x1  to midLAD  . S/P minimally-invasive maze operation for atrial fibrillation 03/30/2020   Complete bilateral atrial lesion set using cryothermy and bipolar radiofrequency ablation with clipping of LA appendage via right mini thoracotomy approach  . SOB (shortness of breath)    12-31-2019  per pt only when he has atrial fib ,  otherwise no issues with sob with activities  . Status post placement of implantable loop recorder 09/08/2019   followed by dr Rayann Heman   Past Surgical History:  Procedure Laterality Date  . APPENDECTOMY  1975  . ATRIAL FIBRILLATION ABLATION N/A 11/02/2013   PVI by Dr Rayann Heman  . ATRIAL FIBRILLATION ABLATION N/A 03/29/2014   PVI and CTI by Dr Rayann Heman  . ATRIAL FIBRILLATION ABLATION N/A 07/04/2016   Procedure: Atrial Fibrillation Ablation;  Surgeon: Thompson Grayer, MD;  Location: [place] [facility];  Service: Cardiovascular;  Laterality: N/A;  . CARDIOVERSION N/A 10/15/2013   Procedure: CARDIOVERSION;  Surgeon: Fay Records, MD;  Location: [facility] ENDOSCOPY;  Service: Cardiovascular;  Laterality: N/A;  . CLIPPING OF ATRIAL APPENDAGE  03/30/2020   Procedure: CLIPPING OF ATRIAL APPENDAGE USING ATRICURE ATRICLIP [id number];  Surgeon: Rexene Alberts, MD;  Location: [place];  Service: Open Heart Surgery;;  . COLONOSCOPY  2016  . CORONARY STENT INTERVENTION N/A 12/23/2017   Procedure: CORONARY STENT INTERVENTION;  Surgeon: Burnell Blanks, MD;  Location: [place] [facility];  Service: Cardiovascular;  Laterality: N/A;  . EXTRACORPOREAL SHOCK WAVE LITHOTRIPSY  2005;  2013  . implantable loop recorder placement  09/08/2019   (done in office)   Medtronic Reveal Linq model LNQ 22 (SN [id number]) implantable loop recorder implanted in office by Dr Rayann Heman for afib management  . INGUINAL HERNIA REPAIR Left 05/31/2015  Procedure: HERNIA REPAIR INGUINAL ADULT;  Surgeon: Earline Mayotte, MD;  Location: [facility] ORS;  Service: General;  Laterality: Left;  . KNEE ARTHROSCOPY Left 2009   @[facility]   . KNEE ARTHROSCOPY WITH MEDIAL MENISECTOMY Right 01/04/2020   Procedure: KNEE ARTHROSCOPY WITH PARTIAL MEDIAL MENISECTOMY;  Surgeon: 03/05/2020, MD;  Location: [facility];  Service: Orthopedics;  Laterality: Right;  . MINIMALLY INVASIVE MAZE PROCEDURE N/A 03/30/2020   Procedure: MINIMALLY INVASIVE MAZE PROCEDURE;   Surgeon: 14/05/2019, MD;  Location: [facility] OR;  Service: Open Heart Surgery;  Laterality: N/A;  . RHINOPLASTY  2006    @[facility]   . RIGHT/LEFT HEART CATH AND CORONARY ANGIOGRAPHY N/A 12/23/2017   Procedure: RIGHT/LEFT HEART CATH AND CORONARY ANGIOGRAPHY;  Surgeon: , MD;  Location: [facility];  Service: Cardiovascular;  Laterality: N/A;  . RIGHT/LEFT HEART CATH AND CORONARY ANGIOGRAPHY N/A 02/02/2020   Procedure: RIGHT/LEFT HEART CATH AND CORONARY ANGIOGRAPHY;  Surgeon: Kathleene Hazel, MD;  Location: [facility];  Service: Cardiovascular;  Laterality: N/A;  . ROBOT ASSISTED INGUINAL HERNIA REPAIR Left 05-01-2013   @[facility]   . TEE WITHOUT CARDIOVERSION N/A 11/01/2013   Procedure: TRANSESOPHAGEAL ECHOCARDIOGRAM (TEE);  Surgeon: 06-29-2013, MD;  Location: [facility] ENDOSCOPY;  Service: Cardiovascular;  Laterality: N/A;  . TEE WITHOUT CARDIOVERSION N/A 03/28/2014   Procedure: TRANSESOPHAGEAL ECHOCARDIOGRAM (TEE);  Surgeon: Donato Schultz, MD;  Location[facility] ENDOSCOPY;  Service: Cardiovascular;  Laterality: N/A;  . TEE WITHOUT CARDIOVERSION N/A 02/02/2020   Procedure: TRANSESOPHAGEAL ECHOCARDIOGRAM (TEE);  Surgeon: Laurey Morale, MD;  Location[facility];  Service: Cardiovascular;  Laterality: N/A;  . TEE WITHOUT CARDIOVERSION N/A 03/30/2020   Procedure: TRANSESOPHAGEAL ECHOCARDIOGRAM (TEE);  Surgeon: Christell Constant, MD;  Location: [facility] OR;  Service: Open Heart Surgery;  Laterality: N/A;    ROS- all systems are reviewed and negatives except as per HPI above  Current Outpatient Medications  Medication Sig Dispense Refill  . amiodarone (PACERONE) 400 MG tablet Take 1 tablet (400 mg total) by mouth 2 (two) times daily. For 7 days, then once daily 41 tablet 1  . aspirin 81 MG EC tablet Take 81 mg by mouth daily.     14/05/2019 aspirin-acetaminophen-caffeine (EXCEDRIN MIGRAINE) 250-250-65 MG tablet Take 2 tablets by mouth every 6 (six) hours as needed for headache.      Purcell Nails azelastine (ASTELIN) 0.1 % nasal spray Place 1 spray into both nostrils 2 (two) times daily. 30 mL 12  . ferrous fumarate-b12-vitamic C-folic acid (TRINSICON / FOLTRIN) capsule Take 1 capsule by mouth 2 (two) times daily after a meal. 60 capsule 1  . fluticasone (FLONASE) 50 MCG/ACT nasal spray Place 2 sprays into both nostrils daily. 16 g 3  . ibuprofen (ADVIL) 200 MG tablet Take 800 mg by mouth every 6 (six) hours as needed for headache or moderate pain.    . montelukast (SINGULAIR) 10 MG tablet Take 1 tablet (10 mg total) by mouth at bedtime. 90 tablet 3  . rosuvastatin (CRESTOR) 40 MG tablet Take 1 tablet (40 mg total) by mouth daily. 90 tablet 0  . tamsulosin (FLOMAX) 0.4 MG CAPS capsule Take 1 capsule (0.4 mg total) by mouth daily after supper. 30 capsule 3  . traMADol (ULTRAM) 50 MG tablet Take 1-2 tablets (50-100 mg total) by mouth every 4 (four) hours as needed for moderate pain. 28 tablet 0  . XARELTO 20 MG TABS tablet Take 1 tablet (20 mg total) by mouth daily with supper.  90 tablet 1   No current facility-administered medications for this visit.    Physical Exam: Vitals:   05/01/20 1614  BP: (!) 142/82  Pulse: 93  SpO2: 96%  Weight: 193 lb 9.6 oz (87.8 kg)  Height: 5\' 10"  (1.778 m)    GEN- The patient is well appearing, alert and oriented x 3 today.   Head- normocephalic, atraumatic Eyes-  Sclera clear, conjunctiva pink Ears- hearing intact Oropharynx- clear Lungs- Clear to ausculation bilaterally, normal work of breathing Heart- Regular rate and rhythm, no murmurs, rubs or gallops, PMI not laterally displaced GI- soft, NT, ND, + BS Extremities- no clubbing, cyanosis, or edema  Wt Readings from Last 3 Encounters:  05/01/20 193 lb 9.6 oz (87.8 kg)  04/24/20 189 lb (85.7 kg)  04/10/20 200 lb (90.7 kg)    EKG tracing ordered today is personally reviewed and shows sinus rhythm 93 bpm, PR 200 msec  Assessment and Plan:  1. Paroxysmal atrial fibrillation S/p MAZE  with LAA clip afib much improved (perhaps resolved since surgery) Consider stopping OAC on return Reduce amiodarone to 200mg  daily today.  In 2 months, reduce to 100mg  daily Consider stopping amiodarone on return We will follow closely on amiodarone to avoid toxicity with this medicine  2. CAD No ischemic symptoms  3. HTN Stable No change required today  I would like for him to be more active.  We will try to pursue cardiac rehab.  Risks, benefits and potential toxicities for medications prescribed and/or refilled reviewed with patient today.   Return in 3 months  Thompson Grayer MD, [facility] 05/01/2020 4:22 PM

## 2020-05-02 LAB — CUP PACEART REMOTE DEVICE CHECK
Date Time Interrogation Session: 20220103001217
Implantable Pulse Generator Implant Date: 20210512

## 2020-05-11 ENCOUNTER — Telehealth: Payer: Self-pay

## 2020-05-11 NOTE — Telephone Encounter (Signed)
[facility] faxed refill request for the following medications:  tamsulosin (FLOMAX) 0.4 MG CAPS capsule  Please advise. Thanks, [employer]

## 2020-05-15 NOTE — Progress Notes (Signed)
Carelink Summary Report / Loop Recorder 

## 2020-05-16 MED ORDER — TAMSULOSIN HCL 0.4 MG PO CAPS: 0.4000 mg | ORAL_CAPSULE | Freq: Every day | ORAL | 3 refills | Status: DC

## 2020-05-18 ENCOUNTER — Other Ambulatory Visit: Payer: Self-pay

## 2020-05-18 ENCOUNTER — Encounter: Payer: BC Managed Care – PPO | Attending: Internal Medicine

## 2020-05-18 DIAGNOSIS — Z9889 Other specified postprocedural states: Secondary | ICD-10-CM | POA: Insufficient documentation

## 2020-05-18 DIAGNOSIS — Z8679 Personal history of other diseases of the circulatory system: Secondary | ICD-10-CM | POA: Insufficient documentation

## 2020-05-18 NOTE — Progress Notes (Signed)
Virtual Visit completed. Patient informed on EP and RD appointment and 6 Minute walk test. Patient also informed of patient health questionnaires on My Chart. Patient Verbalizes understanding. Visit diagnosis can be found in [facility] 05/01/2020.

## 2020-05-25 ENCOUNTER — Encounter: Payer: BC Managed Care – PPO | Admitting: *Deleted

## 2020-05-25 ENCOUNTER — Other Ambulatory Visit: Payer: Self-pay

## 2020-05-25 VITALS — Ht 70.5 in | Wt 190.2 lb

## 2020-05-25 DIAGNOSIS — Z8679 Personal history of other diseases of the circulatory system: Secondary | ICD-10-CM

## 2020-05-25 DIAGNOSIS — Z9889 Other specified postprocedural states: Secondary | ICD-10-CM | POA: Diagnosis not present

## 2020-05-25 NOTE — Patient Instructions (Signed)
Patient Instructions  Patient Details  Name: Jorge [name] MRN: [id number] Date of Birth: [birth year] Referring Provider:  Thompson Grayer, MD  Below are your personal goals for exercise, nutrition, and risk factors. Our goal is to help you stay on track towards obtaining and maintaining these goals. We will be discussing your progress on these goals with you throughout the program.  Initial Exercise Prescription:  Initial Exercise Prescription - 05/25/20 0900      Date of Initial Exercise RX and Referring Provider   Date 05/25/20    Referring Provider Thompson Grayer MD      Treadmill   MPH 2.8    Grade 2    Minutes 15    METs 3.92      Recumbant Elliptical   Level 2    RPM 50    Minutes 15    METs 3      Elliptical   Level 1    Speed 4.4    Minutes 15      Prescription Details   Frequency (times per week) 2    Duration Progress to 30 minutes of continuous aerobic without signs/symptoms of physical distress      Intensity   THRR 40-80% of Max Heartrate 122-151    Ratings of Perceived Exertion 11-13    Perceived Dyspnea 0-4      Progression   Progression Continue to progress workloads to maintain intensity without signs/symptoms of physical distress.      Resistance Training   Training Prescription Yes    Weight 6 lb    Reps 10-15           Exercise Goals: Frequency: Be able to perform aerobic exercise two to three times per week in program working toward 2-5 days per week of home exercise.  Intensity: Work with a perceived exertion of 11 (fairly light) - 15 (hard) while following your exercise prescription.  We will make changes to your prescription with you as you progress through the program.   Duration: Be able to do 30 to 45 minutes of continuous aerobic exercise in addition to a 5 minute warm-up and a 5 minute cool-down routine.   Nutrition Goals: Your personal nutrition goals will be established when you do your nutrition analysis with the  dietician.  The following are general nutrition guidelines to follow: Cholesterol < 200mg /day Sodium < 1500mg /day Fiber: Men over 50 yrs - 30 grams per day  Personal Goals:  Personal Goals and Risk Factors at Admission - 05/25/20 0932      Core Components/Risk Factors/Patient Goals on Admission    Weight Management Yes;Weight Loss    Intervention Weight Management: Develop a combined nutrition and exercise program designed to reach desired caloric intake, while maintaining appropriate intake of nutrient and fiber, sodium and fats, and appropriate energy expenditure required for the weight goal.;Weight Management: Provide education and appropriate resources to help participant work on and attain dietary goals.;Weight Management/Obesity: Establish reasonable short term and long term weight goals.    Admit Weight 190 lb 3.2 oz (86.3 kg)    Goal Weight: Short Term 185 lb (83.9 kg)    Goal Weight: Long Term 185 lb (83.9 kg)    Expected Outcomes Short Term: Continue to assess and modify interventions until short term weight is achieved;Long Term: Adherence to nutrition and physical activity/exercise program aimed toward attainment of established weight goal;Understanding recommendations for meals to include 15-35% energy as protein, 25-35% energy from fat, 35-60% energy from carbohydrates, less than  200mg  of dietary cholesterol, 20-35 gm of total fiber daily;Understanding of distribution of calorie intake throughout the day with the consumption of 4-5 meals/snacks;Weight Loss: Understanding of general recommendations for a balanced deficit meal plan, which promotes 1-2 lb weight loss per week and includes a negative energy balance of [phone] kcal/d;Weight Maintenance: Understanding of the daily nutrition guidelines, which includes 25-35% calories from fat, 7% or less cal from saturated fats, less than 200mg  cholesterol, less than 1.5gm of sodium, & 5 or more servings of fruits and vegetables daily     Hypertension Yes    Intervention Provide education on lifestyle modifcations including regular physical activity/exercise, weight management, moderate sodium restriction and increased consumption of fresh fruit, vegetables, and low fat dairy, alcohol moderation, and smoking cessation.;Monitor prescription use compliance.    Expected Outcomes Short Term: Continued assessment and intervention until BP is < 140/72mm HG in hypertensive participants. < 130/89mm HG in hypertensive participants with diabetes, heart failure or chronic kidney disease.;Long Term: Maintenance of blood pressure at goal levels.    Lipids Yes    Intervention Provide education and support for participant on nutrition & aerobic/resistive exercise along with prescribed medications to achieve LDL 70mg , HDL >40mg .    Expected Outcomes Short Term: Participant states understanding of desired cholesterol values and is compliant with medications prescribed. Participant is following exercise prescription and nutrition guidelines.;Long Term: Cholesterol controlled with medications as prescribed, with individualized exercise RX and with personalized nutrition plan. Value goals: LDL < 70mg , HDL > 40 mg.           Tobacco Use Initial Evaluation: Social History   Tobacco Use  Smoking Status Never Smoker  Smokeless Tobacco Never Used    Exercise Goals and Review:  Exercise Goals    Row Name 05/25/20 0930             Exercise Goals   Increase Physical Activity Yes       Intervention Provide advice, education, support and counseling about physical activity/exercise needs.;Develop an individualized exercise prescription for aerobic and resistive training based on initial evaluation findings, risk stratification, comorbidities and participant's personal goals.       Expected Outcomes Short Term: Attend rehab on a regular basis to increase amount of physical activity.;Long Term: Add in home exercise to make exercise part of routine and  to increase amount of physical activity.;Long Term: Exercising regularly at least 3-5 days a week.       Increase Strength and Stamina Yes       Intervention Provide advice, education, support and counseling about physical activity/exercise needs.;Develop an individualized exercise prescription for aerobic and resistive training based on initial evaluation findings, risk stratification, comorbidities and participant's personal goals.       Expected Outcomes Short Term: Increase workloads from initial exercise prescription for resistance, speed, and METs.;Long Term: Improve cardiorespiratory fitness, muscular endurance and strength as measured by increased METs and functional capacity (6MWT);Short Term: Perform resistance training exercises routinely during rehab and add in resistance training at home       Able to understand and use rate of perceived exertion (RPE) scale Yes       Intervention Provide education and explanation on how to use RPE scale       Expected Outcomes Short Term: Able to use RPE daily in rehab to express subjective intensity level;Long Term:  Able to use RPE to guide intensity level when exercising independently       Able to understand and use Dyspnea scale Yes  Intervention Provide education and explanation on how to use Dyspnea scale       Expected Outcomes Short Term: Able to use Dyspnea scale daily in rehab to express subjective sense of shortness of breath during exertion;Long Term: Able to use Dyspnea scale to guide intensity level when exercising independently       Knowledge and understanding of Target Heart Rate Range (THRR) Yes       Intervention Provide education and explanation of THRR including how the numbers were predicted and where they are located for reference       Expected Outcomes Short Term: Able to state/look up THRR;Short Term: Able to use daily as guideline for intensity in rehab;Long Term: Able to use THRR to govern intensity when exercising  independently       Able to check pulse independently Yes       Intervention Provide education and demonstration on how to check pulse in carotid and radial arteries.;Review the importance of being able to check your own pulse for safety during independent exercise       Expected Outcomes Short Term: Able to explain why pulse checking is important during independent exercise;Long Term: Able to check pulse independently and accurately       Understanding of Exercise Prescription Yes       Intervention Provide education, explanation, and written materials on patient's individual exercise prescription       Expected Outcomes Short Term: Able to explain program exercise prescription;Long Term: Able to explain home exercise prescription to exercise independently              Copy of goals given to participant.

## 2020-05-25 NOTE — Progress Notes (Signed)
Cardiac Individual Treatment Plan  Patient Details  Name: Jorge [name] MRN: [id number] Date of Birth: February 18, 1966 Referring Provider:   Flowsheet Row Cardiac Rehab from 05/25/2020 in [facility] Cardiac and Pulmonary Rehab  Referring Provider Thompson Grayer MD      Initial Encounter Date:  Flowsheet Row Cardiac Rehab from 05/25/2020 in [facility] Cardiac and Pulmonary Rehab  Date 05/25/20      Visit Diagnosis: Status post Maze operation for atrial fibrillation  Patient's Home Medications on Admission:  Current Outpatient Medications:  .  amiodarone (PACERONE) 200 MG tablet, Take 1 tablet (200 mg total) by mouth daily for 56 days, THEN 0.5 tablets (100 mg total) daily., Disp: 90 tablet, Rfl: 3 .  aspirin 81 MG EC tablet, Take 81 mg by mouth daily. , Disp: , Rfl:  .  aspirin-acetaminophen-caffeine (EXCEDRIN MIGRAINE) 250-250-65 MG tablet, Take 2 tablets by mouth every 6 (six) hours as needed for headache. , Disp: , Rfl:  .  azelastine (ASTELIN) 0.1 % nasal spray, Place 1 spray into both nostrils 2 (two) times daily., Disp: 30 mL, Rfl: 12 .  ferrous [id number] C-folic acid (TRINSICON / FOLTRIN) capsule, Take 1 capsule by mouth 2 (two) times daily after a meal., Disp: 60 capsule, Rfl: 1 .  fluticasone (FLONASE) 50 MCG/ACT nasal spray, Place 2 sprays into both nostrils daily., Disp: 16 g, Rfl: 3 .  ibuprofen (ADVIL) 200 MG tablet, Take 800 mg by mouth every 6 (six) hours as needed for headache or moderate pain., Disp: , Rfl:  .  montelukast (SINGULAIR) 10 MG tablet, Take 1 tablet (10 mg total) by mouth at bedtime., Disp: 90 tablet, Rfl: 3 .  rosuvastatin (CRESTOR) 40 MG tablet, Take 1 tablet (40 mg total) by mouth daily., Disp: 90 tablet, Rfl: 0 .  tamsulosin (FLOMAX) 0.4 MG CAPS capsule, Take 1 capsule (0.4 mg total) by mouth daily after supper., Disp: 30 capsule, Rfl: 3 .  traMADol (ULTRAM) 50 MG tablet, Take 1-2 tablets (50-100 mg total) by mouth every 4 (four) hours as needed for  moderate pain., Disp: 28 tablet, Rfl: 0 .  XARELTO 20 MG TABS tablet, Take 1 tablet (20 mg total) by mouth daily with supper., Disp: 90 tablet, Rfl: 1  Past Medical History: Past Medical History:  Diagnosis Date  . Allergic rhinitis, seasonal   . Anticoagulant long-term use managed by cardiology   asa and xarelto  . Bilateral nephrolithiasis    urologist--- dr Junious Silk ([place] office)  . Coronary artery disease cardiologist--- dr Angelena Form   nuclear stress test 10-18-2016 w/ normal perfusion;  cardiac cath 12-23-2017, PCI w/ DES to midLAD and mild RCA/ moderate CFx diease nonobstructive , normal LVF;  ETT 07-13-2019 negative for ischemia  . Dysrhythmia    Afib  . History of kidney stones   . Hyperlipidemia   . Hypertension    followed by pcp and cardiology  . OA (osteoarthritis)    knees  . OSA on CPAP    12-31-2019  per pt uses cpap every night  . PAF (paroxysmal atrial fibrillation) ([place]) 2011   followed by dr Rayann Heman (ep)---  first dx 2011;  s/p DCCV's and ablation x2 in 2015 and last one 03/ 2018, and s/p loop recorder 05/ 2021;   failed multaq, flecainide, and amiodarone  . Pneumonia   . Right knee meniscal tear   . S/P ablation of atrial fibrillation followed by dr allred   x3  last one 2018  . S/P drug eluting coronary stent placement  12/23/2017   x1  to midLAD  . S/P minimally-invasive maze operation for atrial fibrillation 03/30/2020   Complete bilateral atrial lesion set using cryothermy and bipolar radiofrequency ablation with clipping of LA appendage via right mini thoracotomy approach  . SOB (shortness of breath)    12-31-2019  per pt only when he has atrial fib , otherwise no issues with sob with activities  . Status post placement of implantable loop recorder 09/08/2019   followed by dr allred    Tobacco Use: Social History   Tobacco Use  Smoking Status Never Smoker  Smokeless Tobacco Never Used    Labs: Recent Review Flowsheet Data    Labs for ITP  Cardiac and Pulmonary Rehab Latest Ref Rng & Units 03/30/2020 03/30/2020 03/30/2020 03/30/2020 03/30/2020   Cholestrol 100 - 199 mg/dL - - - - -   LDLCALC 0 - 99 mg/dL - - - - -   HDL >39 mg/dL - - - - -   Trlycerides 0 - 149 mg/dL - - - - -   Hemoglobin A1c 4.8 - 5.6 % - - - - -   PHART 7.350 - 7.450 7.276(L) - 7.328(L) 7.267(L) 7.358   PCO2ART 32.0 - 48.0 mmHg 53.0(H) - 45.9 51.5(H) 34.4   HCO3 20.0 - 28.0 mmol/L 24.7 - 24.2 23.6 19.3(L)   TCO2 22 - 32 mmol/L 26 23 26 25  20(L)   ACIDBASEDEF 0.0 - 2.0 mmol/L 2.0 - 2.0 4.0(H) 6.0(H)   O2SAT % 93.0 - 99.0 92.0 90.0       Exercise Target Goals: Exercise Program Goal: Individual exercise prescription set using results from initial 6 min walk test and THRR while considering  patient's activity barriers and safety.   Exercise Prescription Goal: Initial exercise prescription builds to 30-45 minutes a day of aerobic activity, 2-3 days per week.  Home exercise guidelines will be given to patient during program as part of exercise prescription that the participant will acknowledge.   Education: Aerobic Exercise: - Group verbal and visual presentation on the components of exercise prescription. Introduces F.I.T.T principle from ACSM for exercise prescriptions.  Reviews F.I.T.T. principles of aerobic exercise including progression. Written material given at graduation.   Education: Resistance Exercise: - Group verbal and visual presentation on the components of exercise prescription. Introduces F.I.T.T principle from ACSM for exercise prescriptions  Reviews F.I.T.T. principles of resistance exercise including progression. Written material given at graduation.    Education: Exercise & Equipment Safety: - Individual verbal instruction and demonstration of equipment use and safety with use of the equipment. Flowsheet Row Cardiac Rehab from 05/18/2020 in [facility] Cardiac and Pulmonary Rehab  Date 05/18/20  Educator [facility]  Instruction Review Code 1-  Verbalizes Understanding      Education: Exercise Physiology & General Exercise Guidelines: - Group verbal and written instruction with models to review the exercise physiology of the cardiovascular system and associated critical values. Provides general exercise guidelines with specific guidelines to those with heart or lung disease.    Education: Flexibility, Balance, Mind/Body Relaxation: - Group verbal and visual presentation with interactive activity on the components of exercise prescription. Introduces F.I.T.T principle from ACSM for exercise prescriptions. Reviews F.I.T.T. principles of flexibility and balance exercise training including progression. Also discusses the mind body connection.  Reviews various relaxation techniques to help reduce and manage stress (i.e. Deep breathing, progressive muscle relaxation, and visualization). Balance handout provided to take home. Written material given at graduation.   Activity Barriers & Risk Stratification:  Activity Barriers &  Cardiac Risk Stratification - 05/25/20 0928      Activity Barriers & Cardiac Risk Stratification   Activity Barriers Joint Problems;Shortness of Breath;Muscular Weakness;Other (comment);Incisional Pain    Comments bilateral knee surgery (most recent meniscus tear in 9/20200, incisional soreness, weakness in chest from incision    Cardiac Risk Stratification Moderate           6 Minute Walk:  6 Minute Walk    Row Name 05/25/20 0926         6 Minute Walk   Phase Initial     Distance 1515 feet     Walk Time 6 minutes     # of Rest Breaks 0     MPH 2.87     METS 4.34     RPE 10     Perceived Dyspnea  1.5     VO2 Peak 15.19     Symptoms Yes (comment)     Comments incisional soreness 2/10     Resting HR 93 bpm     Resting BP 122/64     Resting Oxygen Saturation  98 %     Exercise Oxygen Saturation  during 6 min walk 96 %     Max Ex. HR 108 bpm     Max Ex. BP 134/64     2 Minute Post BP 108/60             Oxygen Initial Assessment:   Oxygen Re-Evaluation:   Oxygen Discharge (Final Oxygen Re-Evaluation):   Initial Exercise Prescription:  Initial Exercise Prescription - 05/25/20 0900      Date of Initial Exercise RX and Referring Provider   Date 05/25/20    Referring Provider Thompson Grayer MD      Treadmill   MPH 2.8    Grade 2    Minutes 15    METs 3.92      Recumbant Elliptical   Level 2    RPM 50    Minutes 15    METs 3      Elliptical   Level 1    Speed 4.4    Minutes 15      Prescription Details   Frequency (times per week) 2    Duration Progress to 30 minutes of continuous aerobic without signs/symptoms of physical distress      Intensity   THRR 40-80% of Max Heartrate 122-151    Ratings of Perceived Exertion 11-13    Perceived Dyspnea 0-4      Progression   Progression Continue to progress workloads to maintain intensity without signs/symptoms of physical distress.      Resistance Training   Training Prescription Yes    Weight 6 lb    Reps 10-15           Perform Capillary Blood Glucose checks as needed.  Exercise Prescription Changes:  Exercise Prescription Changes    Row Name 05/25/20 0900             Response to Exercise   Blood Pressure (Admit) 122/64       Blood Pressure (Exercise) 134/64       Blood Pressure (Exit) 108/60       Heart Rate (Admit) 93 bpm       Heart Rate (Exercise) 108 bpm       Heart Rate (Exit) 96 bpm       Oxygen Saturation (Admit) 97 %       Oxygen Saturation (Exercise) 96 %  Rating of Perceived Exertion (Exercise) 10       Perceived Dyspnea (Exercise) 1.5       Symptoms incisional soreness 2/10       Comments walk test results              Exercise Comments:   Exercise Goals and Review:  Exercise Goals    Row Name 05/25/20 0930             Exercise Goals   Increase Physical Activity Yes       Intervention Provide advice, education, support and counseling about physical  activity/exercise needs.;Develop an individualized exercise prescription for aerobic and resistive training based on initial evaluation findings, risk stratification, comorbidities and participant's personal goals.       Expected Outcomes Short Term: Attend rehab on a regular basis to increase amount of physical activity.;Long Term: Add in home exercise to make exercise part of routine and to increase amount of physical activity.;Long Term: Exercising regularly at least 3-5 days a week.       Increase Strength and Stamina Yes       Intervention Provide advice, education, support and counseling about physical activity/exercise needs.;Develop an individualized exercise prescription for aerobic and resistive training based on initial evaluation findings, risk stratification, comorbidities and participant's personal goals.       Expected Outcomes Short Term: Increase workloads from initial exercise prescription for resistance, speed, and METs.;Long Term: Improve cardiorespiratory fitness, muscular endurance and strength as measured by increased METs and functional capacity (6MWT);Short Term: Perform resistance training exercises routinely during rehab and add in resistance training at home       Able to understand and use rate of perceived exertion (RPE) scale Yes       Intervention Provide education and explanation on how to use RPE scale       Expected Outcomes Short Term: Able to use RPE daily in rehab to express subjective intensity level;Long Term:  Able to use RPE to guide intensity level when exercising independently       Able to understand and use Dyspnea scale Yes       Intervention Provide education and explanation on how to use Dyspnea scale       Expected Outcomes Short Term: Able to use Dyspnea scale daily in rehab to express subjective sense of shortness of breath during exertion;Long Term: Able to use Dyspnea scale to guide intensity level when exercising independently       Knowledge and  understanding of Target Heart Rate Range (THRR) Yes       Intervention Provide education and explanation of THRR including how the numbers were predicted and where they are located for reference       Expected Outcomes Short Term: Able to state/look up THRR;Short Term: Able to use daily as guideline for intensity in rehab;Long Term: Able to use THRR to govern intensity when exercising independently       Able to check pulse independently Yes       Intervention Provide education and demonstration on how to check pulse in carotid and radial arteries.;Review the importance of being able to check your own pulse for safety during independent exercise       Expected Outcomes Short Term: Able to explain why pulse checking is important during independent exercise;Long Term: Able to check pulse independently and accurately       Understanding of Exercise Prescription Yes       Intervention Provide education, explanation, and written materials  on patient's individual exercise prescription       Expected Outcomes Short Term: Able to explain program exercise prescription;Long Term: Able to explain home exercise prescription to exercise independently              Exercise Goals Re-Evaluation :   Discharge Exercise Prescription (Final Exercise Prescription Changes):  Exercise Prescription Changes - 05/25/20 0900      Response to Exercise   Blood Pressure (Admit) 122/64    Blood Pressure (Exercise) 134/64    Blood Pressure (Exit) 108/60    Heart Rate (Admit) 93 bpm    Heart Rate (Exercise) 108 bpm    Heart Rate (Exit) 96 bpm    Oxygen Saturation (Admit) 97 %    Oxygen Saturation (Exercise) 96 %    Rating of Perceived Exertion (Exercise) 10    Perceived Dyspnea (Exercise) 1.5    Symptoms incisional soreness 2/10    Comments walk test results           Nutrition:  Target Goals: Understanding of nutrition guidelines, daily intake of sodium 1500mg , cholesterol 200mg , calories 30% from fat and  7% or less from saturated fats, daily to have 5 or more servings of fruits and vegetables.  Education: All About Nutrition: -Group instruction provided by verbal, written material, interactive activities, discussions, models, and posters to present general guidelines for heart healthy nutrition including fat, fiber, MyPlate, the role of sodium in heart healthy nutrition, utilization of the nutrition label, and utilization of this knowledge for meal planning. Follow up email sent as well. Written material given at graduation. Flowsheet Row Cardiac Rehab from 05/18/2020 in [facility] Cardiac and Pulmonary Rehab  Education need identified 05/24/20      Biometrics:  Pre Biometrics - 05/25/20 0931      Pre Biometrics   Height 5' 10.5" (1.791 m)    Weight 190 lb 3.2 oz (86.3 kg)    BMI (Calculated) 26.9    Single Leg Stand 28.8 seconds            Nutrition Therapy Plan and Nutrition Goals:  Nutrition Therapy & Goals - 05/25/20 0925      Nutrition Therapy   Diet Heart healthy, low Na    Drug/Food Interactions Statins/Certain Fruits    Protein (specify units) 70g    Fiber 30 grams    Whole Grain Foods 3 servings    Saturated Fats 12 max. grams    Fruits and Vegetables 8 servings/day    Sodium 1.5 grams      Personal Nutrition Goals   Nutrition Goal ST: check almond milk for fortification, reduce sodium using some of the techniques discussed (citrus, vinegar, etc), increase variety, try "nice" cream LT: fiber 30g/day, 8 fruits and vegetables per day    Comments He reports limiting red meat, eating pork chops 1x/month, chicken and fish mostly. He reports eating whole grains and having a garden of his own vegetables he eats over the summer. he drinks almond milk or 2% milk. He likes oikos yogurt. For breakfast he will have oatmeal or grits with fruit and ground flax; on the weekned he will have bacon and eggs. L: sandwich ([place] or peanut butter and jelly) D: chicken, fish more during the  summer. During the winter they eat more crockpot meals like chili. Vegetables: leafy greens, string beans, butter beans, peas. Fruit: bananas, apples, clementines, fruit medley. S: ice cream or some fruit. Discussed heart healhty eating. Recommended trying "nice" cream (made with frozen bananas).  Intervention Plan   Intervention Prescribe, educate and counsel regarding individualized specific dietary modifications aiming towards targeted core components such as weight, hypertension, lipid management, diabetes, heart failure and other comorbidities.;Nutrition handout(s) given to patient.    Expected Outcomes Short Term Goal: Understand basic principles of dietary content, such as calories, fat, sodium, cholesterol and nutrients.;Short Term Goal: A plan has been developed with personal nutrition goals set during dietitian appointment.;Long Term Goal: Adherence to prescribed nutrition plan.           Nutrition Assessments:  MEDIFICTS Score Key:  ?70 Need to make dietary changes   40-70 Heart Healthy Diet  ? 40 Therapeutic Level Cholesterol Diet  Flowsheet Row Cardiac Rehab from 05/25/2020 in [facility] Cardiac and Pulmonary Rehab  Picture Your Plate Total Score on Admission 83     Picture Your Plate Scores:  [id number] Unhealthy dietary pattern with much room for improvement.  41-50 Dietary pattern unlikely to meet recommendations for good health and room for improvement.  51-60 More healthful dietary pattern, with some room for improvement.   >60 Healthy dietary pattern, although there may be some specific behaviors that could be improved.    Nutrition Goals Re-Evaluation:   Nutrition Goals Discharge (Final Nutrition Goals Re-Evaluation):   Psychosocial: Target Goals: Acknowledge presence or absence of significant depression and/or stress, maximize coping skills, provide positive support system. Participant is able to verbalize types and ability to use techniques and skills needed for  reducing stress and depression.   Education: Stress, Anxiety, and Depression - Group verbal and visual presentation to define topics covered.  Reviews how body is impacted by stress, anxiety, and depression.  Also discusses healthy ways to reduce stress and to treat/manage anxiety and depression.  Written material given at graduation. Flowsheet Row Cardiac Rehab from 05/18/2020 in [facility] Cardiac and Pulmonary Rehab  Education need identified 05/24/20      Education: Sleep Hygiene -Provides group verbal and written instruction about how sleep can affect your health.  Define sleep hygiene, discuss sleep cycles and impact of sleep habits. Review good sleep hygiene tips.    Initial Review & Psychosocial Screening:  Initial Psych Review & Screening - 05/18/20 1013      Initial Review   Current issues with None Identified      Family Dynamics   Good Support System? Yes    Comments He can look to his son and his wife for support. He has a good outlook on his health after his procedure and is ready to start exercising.      Barriers   Psychosocial barriers to participate in program There are no identifiable barriers or psychosocial needs.      Screening Interventions   Interventions To provide support and resources with identified psychosocial needs;Provide feedback about the scores to participant    Expected Outcomes Short Term goal: Utilizing psychosocial counselor, staff and physician to assist with identification of specific Stressors or current issues interfering with healing process. Setting desired goal for each stressor or current issue identified.;Long Term Goal: Stressors or current issues are controlled or eliminated.;Short Term goal: Identification and review with participant of any Quality of Life or Depression concerns found by scoring the questionnaire.;Long Term goal: The participant improves quality of Life and PHQ9 Scores as seen by post scores and/or verbalization of changes            Quality of Life Scores:   Quality of Life - 05/24/20 1313      Quality of Life  Select Quality of Life      Quality of Life Scores   Health/Function Pre 25.07 %    Socioeconomic Pre 26.25 %    Psych/Spiritual Pre 28.07 %    Family Pre 28.8 %    GLOBAL Pre 26.47 %          Scores of 19 and below usually indicate a poorer quality of life in these areas.  A difference of  2-3 points is a clinically meaningful difference.  A difference of 2-3 points in the total score of the Quality of Life Index has been associated with significant improvement in overall quality of life, self-image, physical symptoms, and general health in studies assessing change in quality of life.  PHQ-9: Recent Review Flowsheet Data    Depression screen [facility] 2/9 05/25/2020 04/06/2019 07/30/2018 06/29/2018 03/31/2018   Decreased Interest 0 0 0 0 0   Down, Depressed, Hopeless 0 0 0 0 0   PHQ - 2 Score 0 0 0 0 0   Altered sleeping 1 0 - - -   Tired, decreased energy 0 0 - - -   Change in appetite 0 0 - - -   Feeling bad or failure about yourself  0 0 - - -   Trouble concentrating 0 0 - - -   Moving slowly or fidgety/restless 0 0 - - -   Suicidal thoughts 0 0 - - -   PHQ-9 Score 1 0 - - -   Difficult doing work/chores Not difficult at all Not difficult at all - - -     Interpretation of Total Score  Total Score Depression Severity:  1-4 = Minimal depression, 5-9 = Mild depression, 10-14 = Moderate depression, 15-19 = Moderately severe depression, 20-27 = Severe depression   Psychosocial Evaluation and Intervention:  Psychosocial Evaluation - 05/18/20 1014      Psychosocial Evaluation & Interventions   Interventions Encouraged to exercise with the program and follow exercise prescription;Relaxation education;Stress management education    Comments He can look to his son and his wife for support. He has a good outlook on his health after his procedure and is ready to start exercising.    Expected Outcomes  Short: Exercise regularly to support mental health and notify staff of any changes. Long: maintain mental health and well being through teaching of rehab or prescribed medications independently.    Continue Psychosocial Services  Follow up required by staff           Psychosocial Re-Evaluation:   Psychosocial Discharge (Final Psychosocial Re-Evaluation):   Vocational Rehabilitation: Provide vocational rehab assistance to qualifying candidates.   Vocational Rehab Evaluation & Intervention:   Education: Education Goals: Education classes will be provided on a variety of topics geared toward better understanding of heart health and risk factor modification. Participant will state understanding/return demonstration of topics presented as noted by education test scores.  Learning Barriers/Preferences:  Learning Barriers/Preferences - 05/18/20 1009      Learning Barriers/Preferences   Learning Barriers None    Learning Preferences None           General Cardiac Education Topics:  AED/CPR: - Group verbal and written instruction with the use of models to demonstrate the basic use of the AED with the basic ABC's of resuscitation.   Anatomy and Cardiac Procedures: - Group verbal and visual presentation and models provide information about basic cardiac anatomy and function. Reviews the testing methods done to diagnose heart disease and the outcomes of the  test results. Describes the treatment choices: Medical Management, Angioplasty, or Coronary Bypass Surgery for treating various heart conditions including Myocardial Infarction, Angina, Valve Disease, and Cardiac Arrhythmias.  Written material given at graduation.   Medication Safety: - Group verbal and visual instruction to review commonly prescribed medications for heart and lung disease. Reviews the medication, class of the drug, and side effects. Includes the steps to properly store meds and maintain the prescription regimen.   Written material given at graduation.   Intimacy: - Group verbal instruction through game format to discuss how heart and lung disease can affect sexual intimacy. Written material given at graduation..   Know Your Numbers and Heart Failure: - Group verbal and visual instruction to discuss disease risk factors for cardiac and pulmonary disease and treatment options.  Reviews associated critical values for Overweight/Obesity, Hypertension, Cholesterol, and Diabetes.  Discusses basics of heart failure: signs/symptoms and treatments.  Introduces Heart Failure Zone chart for action plan for heart failure.  Written material given at graduation.   Infection Prevention: - Provides verbal and written material to individual with discussion of infection control including proper hand washing and proper equipment cleaning during exercise session. Flowsheet Row Cardiac Rehab from 05/18/2020 in [facility] Cardiac and Pulmonary Rehab  Date 05/18/20  Educator [facility]  Instruction Review Code 1- Verbalizes Understanding      Falls Prevention: - Provides verbal and written material to individual with discussion of falls prevention and safety. Flowsheet Row Cardiac Rehab from 05/18/2020 in [facility] Cardiac and Pulmonary Rehab  Date 05/18/20  Educator [facility]  Instruction Review Code 1- Verbalizes Understanding      Other: -Provides group and verbal instruction on various topics (see comments)   Knowledge Questionnaire Score:  Knowledge Questionnaire Score - 05/24/20 1313      Knowledge Questionnaire Score   Pre Score 23/26 Education Focus: Depression, nutrition           Core Components/Risk Factors/Patient Goals at Admission:  Personal Goals and Risk Factors at Admission - 05/25/20 0932      Core Components/Risk Factors/Patient Goals on Admission    Weight Management Yes;Weight Loss    Intervention Weight Management: Develop a combined nutrition and exercise program designed to reach desired caloric intake,  while maintaining appropriate intake of nutrient and fiber, sodium and fats, and appropriate energy expenditure required for the weight goal.;Weight Management: Provide education and appropriate resources to help participant work on and attain dietary goals.;Weight Management/Obesity: Establish reasonable short term and long term weight goals.    Admit Weight 190 lb 3.2 oz (86.3 kg)    Goal Weight: Short Term 185 lb (83.9 kg)    Goal Weight: Long Term 185 lb (83.9 kg)    Expected Outcomes Short Term: Continue to assess and modify interventions until short term weight is achieved;Long Term: Adherence to nutrition and physical activity/exercise program aimed toward attainment of established weight goal;Understanding recommendations for meals to include 15-35% energy as protein, 25-35% energy from fat, 35-60% energy from carbohydrates, less than 200mg  of dietary cholesterol, 20-35 gm of total fiber daily;Understanding of distribution of calorie intake throughout the day with the consumption of 4-5 meals/snacks;Weight Loss: Understanding of general recommendations for a balanced deficit meal plan, which promotes 1-2 lb weight loss per week and includes a negative energy balance of [phone] kcal/d;Weight Maintenance: Understanding of the daily nutrition guidelines, which includes 25-35% calories from fat, 7% or less cal from saturated fats, less than 200mg  cholesterol, less than 1.5gm of sodium, & 5 or more servings  of fruits and vegetables daily    Hypertension Yes    Intervention Provide education on lifestyle modifcations including regular physical activity/exercise, weight management, moderate sodium restriction and increased consumption of fresh fruit, vegetables, and low fat dairy, alcohol moderation, and smoking cessation.;Monitor prescription use compliance.    Expected Outcomes Short Term: Continued assessment and intervention until BP is < 140/16mm HG in hypertensive participants. < 130/71mm HG in  hypertensive participants with diabetes, heart failure or chronic kidney disease.;Long Term: Maintenance of blood pressure at goal levels.    Lipids Yes    Intervention Provide education and support for participant on nutrition & aerobic/resistive exercise along with prescribed medications to achieve LDL 70mg , HDL >40mg .    Expected Outcomes Short Term: Participant states understanding of desired cholesterol values and is compliant with medications prescribed. Participant is following exercise prescription and nutrition guidelines.;Long Term: Cholesterol controlled with medications as prescribed, with individualized exercise RX and with personalized nutrition plan. Value goals: LDL < 70mg , HDL > 40 mg.           Education:Diabetes - Individual verbal and written instruction to review signs/symptoms of diabetes, desired ranges of glucose level fasting, after meals and with exercise. Acknowledge that pre and post exercise glucose checks will be done for 3 sessions at entry of program.   Core Components/Risk Factors/Patient Goals Review:    Core Components/Risk Factors/Patient Goals at Discharge (Final Review):    ITP Comments:  ITP Comments    Row Name 05/18/20 1009 05/25/20 0926         ITP Comments Virtual Visit completed. Patient informed on EP and RD appointment and 6 Minute walk test. Patient also informed of patient health questionnaires on My Chart. Patient Verbalizes understanding. Visit diagnosis can be found in [facility] 05/01/2020. Completed 6MWT and gym orientation. Initial ITP created and sent for review to Dr. Emily Filbert, Medical Director.             Comments: Initial ITP

## 2020-05-30 ENCOUNTER — Encounter: Payer: BC Managed Care – PPO | Attending: Internal Medicine | Admitting: *Deleted

## 2020-05-30 ENCOUNTER — Other Ambulatory Visit: Payer: Self-pay

## 2020-05-30 DIAGNOSIS — Z8679 Personal history of other diseases of the circulatory system: Secondary | ICD-10-CM | POA: Diagnosis present

## 2020-05-30 DIAGNOSIS — I4819 Other persistent atrial fibrillation: Secondary | ICD-10-CM | POA: Insufficient documentation

## 2020-05-30 DIAGNOSIS — Z9889 Other specified postprocedural states: Secondary | ICD-10-CM | POA: Insufficient documentation

## 2020-05-30 NOTE — Progress Notes (Signed)
Daily Session Note  Patient Details  Name: Jorge [name] MRN: [id number] Date of Birth: 17-Mar-1966 Referring Provider:   Flowsheet Row Cardiac Rehab from 05/25/2020 in [facility] Cardiac and Pulmonary Rehab  Referring Provider Thompson Grayer MD      Encounter Date: 05/30/2020  Check In:  Session Check In - 05/30/20 0806      Check-In   Supervising physician immediately available to respond to emergencies See telemetry face sheet for immediately available ER MD    Location [facility]-Cardiac & Pulmonary Rehab    Staff Present Heath Lark, RN, BSN, Dorris Singh, MPA, Nino Glow, MS Exercise Physiologist    Virtual Visit No    Medication changes reported     No    Fall or balance concerns reported    No    Warm-up and Cool-down Performed on first and last piece of equipment    Resistance Training Performed Yes    VAD Patient? No    PAD/SET Patient? No      Pain Assessment   Currently in Pain? No/denies              Social History   Tobacco Use  Smoking Status Never Smoker  Smokeless Tobacco Never Used    Goals Met:  Exercise tolerated well Personal goals reviewed No report of cardiac concerns or symptoms  Goals Unmet:  Not Applicable  Comments: First full day of exercise!  Patient was oriented to gym and equipment including functions, settings, policies, and procedures.  Patient's individual exercise prescription and treatment plan were reviewed.  All starting workloads were established based on the results of the 6 minute walk test done at initial orientation visit.  The plan for exercise progression was also introduced and progression will be customized based on patient's performance and goals.    Dr. Emily Filbert is Medical Director for [place] and [name].

## 2020-06-01 ENCOUNTER — Other Ambulatory Visit: Payer: Self-pay

## 2020-06-01 DIAGNOSIS — Z8679 Personal history of other diseases of the circulatory system: Secondary | ICD-10-CM

## 2020-06-01 DIAGNOSIS — Z9889 Other specified postprocedural states: Secondary | ICD-10-CM

## 2020-06-01 NOTE — Progress Notes (Signed)
Daily Session Note  Patient Details  Name: Jorge [name] MRN: [id number] Date of Birth: [birth year] Referring Provider:   Flowsheet Row Cardiac Rehab from 05/25/2020 in [facility] Cardiac and Pulmonary Rehab  Referring Provider Thompson Grayer MD      Encounter Date: 06/01/2020  Check In:  Session Check In - 06/01/20 0730      Check-In   Supervising physician immediately available to respond to emergencies See telemetry face sheet for immediately available ER MD    Location [facility]-Cardiac & Pulmonary Rehab    Staff Present Birdie Sons, MPA, RN;Melissa Caiola RDN, Rowe Pavy, BA, ACSM CEP, Exercise Physiologist    Virtual Visit No    Medication changes reported     No    Fall or balance concerns reported    No    Warm-up and Cool-down Performed on first and last piece of equipment    Resistance Training Performed Yes    VAD Patient? No    PAD/SET Patient? No      Pain Assessment   Currently in Pain? No/denies              Social History   Tobacco Use  Smoking Status Never Smoker  Smokeless Tobacco Never Used    Goals Met:  Independence with exercise equipment Exercise tolerated well No report of cardiac concerns or symptoms Strength training completed today  Goals Unmet:  Not Applicable  Comments: Pt able to follow exercise prescription today without complaint.  Will continue to monitor for progression.    Dr. Emily Filbert is Medical Director for [place] and [name].

## 2020-06-02 LAB — CUP PACEART REMOTE DEVICE CHECK
Date Time Interrogation Session: 20220203091444
Implantable Pulse Generator Implant Date: 20210512

## 2020-06-05 ENCOUNTER — Ambulatory Visit (INDEPENDENT_AMBULATORY_CARE_PROVIDER_SITE_OTHER): Payer: BC Managed Care – PPO

## 2020-06-05 ENCOUNTER — Encounter: Payer: Self-pay | Admitting: Thoracic Surgery (Cardiothoracic Vascular Surgery)

## 2020-06-05 DIAGNOSIS — I4819 Other persistent atrial fibrillation: Secondary | ICD-10-CM

## 2020-06-06 ENCOUNTER — Other Ambulatory Visit: Payer: Self-pay

## 2020-06-06 ENCOUNTER — Encounter: Payer: BC Managed Care – PPO | Admitting: *Deleted

## 2020-06-06 DIAGNOSIS — Z9889 Other specified postprocedural states: Secondary | ICD-10-CM | POA: Diagnosis not present

## 2020-06-06 DIAGNOSIS — Z8679 Personal history of other diseases of the circulatory system: Secondary | ICD-10-CM

## 2020-06-06 NOTE — Progress Notes (Signed)
Daily Session Note  Patient Details  Name: PRESS CASALE MRN: [id number] Date of Birth: 11/16/1965 Referring Provider:   Flowsheet Row Cardiac Rehab from 05/25/2020 in [facility] Cardiac and Pulmonary Rehab  Referring Provider Thompson Grayer MD      Encounter Date: 06/06/2020  Check In:  Session Check In - 06/06/20 0805      Check-In   Supervising physician immediately available to respond to emergencies See telemetry face sheet for immediately available ER MD    Location [facility]-Cardiac & Pulmonary Rehab    Staff Present Heath Lark, RN, BSN, Jacklynn Bue, MS Exercise Physiologist;Amanda Oletta Darter, [place], ACSM CEP, Exercise Physiologist    Virtual Visit No    Medication changes reported     No    Fall or balance concerns reported    No    Warm-up and Cool-down Performed on first and last piece of equipment    Resistance Training Performed Yes    VAD Patient? No    PAD/SET Patient? No      Pain Assessment   Currently in Pain? No/denies              Social History   Tobacco Use  Smoking Status Never Smoker  Smokeless Tobacco Never Used    Goals Met:  Independence with exercise equipment Exercise tolerated well No report of cardiac concerns or symptoms  Goals Unmet:  Not Applicable  Comments: Pt able to follow exercise prescription today without complaint.  Will continue to monitor for progression.    Dr. Emily Filbert is Medical Director for [place] and [name].

## 2020-06-08 ENCOUNTER — Other Ambulatory Visit: Payer: Self-pay

## 2020-06-08 DIAGNOSIS — Z9889 Other specified postprocedural states: Secondary | ICD-10-CM

## 2020-06-08 DIAGNOSIS — Z8679 Personal history of other diseases of the circulatory system: Secondary | ICD-10-CM

## 2020-06-08 NOTE — Progress Notes (Signed)
Carelink Summary Report / Loop Recorder 

## 2020-06-08 NOTE — Progress Notes (Signed)
Daily Session Note  Patient Details  Name: JONOVAN BOEDECKER MRN: [id number] Date of Birth: Jul 26, 1965 Referring Provider:   Flowsheet Row Cardiac Rehab from 05/25/2020 in [facility] Cardiac and Pulmonary Rehab  Referring Provider Thompson Grayer MD      Encounter Date: 06/08/2020  Check In:  Session Check In - 06/08/20 0725      Check-In   Supervising physician immediately available to respond to emergencies See telemetry face sheet for immediately available ER MD    Location [facility]-Cardiac & Pulmonary Rehab    Staff Present Birdie Sons, MPA, RN;Amanda Oletta Darter, BA, ACSM CEP, Exercise Physiologist;Melissa Caiola RDN, LDN    Virtual Visit No    Medication changes reported     No    Fall or balance concerns reported    No    Warm-up and Cool-down Performed on first and last piece of equipment    Resistance Training Performed Yes    VAD Patient? No    PAD/SET Patient? No      Pain Assessment   Currently in Pain? No/denies              Social History   Tobacco Use  Smoking Status Never Smoker  Smokeless Tobacco Never Used    Goals Met:  Independence with exercise equipment Exercise tolerated well No report of cardiac concerns or symptoms Strength training completed today  Goals Unmet:  Not Applicable  Comments: Pt able to follow exercise prescription today without complaint.  Will continue to monitor for progression.    Dr. Emily Filbert is Medical Director for [place] and [name].

## 2020-06-13 ENCOUNTER — Encounter: Payer: BC Managed Care – PPO | Admitting: *Deleted

## 2020-06-13 ENCOUNTER — Other Ambulatory Visit: Payer: Self-pay

## 2020-06-13 DIAGNOSIS — Z9889 Other specified postprocedural states: Secondary | ICD-10-CM | POA: Diagnosis not present

## 2020-06-13 DIAGNOSIS — Z8679 Personal history of other diseases of the circulatory system: Secondary | ICD-10-CM

## 2020-06-13 NOTE — Progress Notes (Signed)
Daily Session Note  Patient Details  Name: Jorge [name] MRN: [id number] Date of Birth: 04-02-1966 Referring Provider:   Flowsheet Row Cardiac Rehab from 05/25/2020 in [facility] Cardiac and Pulmonary Rehab  Referring Provider Thompson Grayer MD      Encounter Date: 06/13/2020  Check In:  Session Check In - 06/13/20 0833      Check-In   Supervising physician immediately available to respond to emergencies See telemetry face sheet for immediately available ER MD    Location [facility]-Cardiac & Pulmonary Rehab    Staff Present Heath Lark, RN, BSN, Jacklynn Bue, MS Exercise Physiologist;Amanda Oletta Darter, [place], ACSM CEP, Exercise Physiologist    Virtual Visit No    Medication changes reported     No    Fall or balance concerns reported    No    Warm-up and Cool-down Performed on first and last piece of equipment    Resistance Training Performed Yes    VAD Patient? No    PAD/SET Patient? No      Pain Assessment   Currently in Pain? No/denies              Social History   Tobacco Use  Smoking Status Never Smoker  Smokeless Tobacco Never Used    Goals Met:  Independence with exercise equipment Exercise tolerated well No report of cardiac concerns or symptoms  Goals Unmet:  Not Applicable  Comments:Pt able to follow exercise prescription today without complaint.  Will continue to monitor for progression.   Dr. Emily Filbert is Medical Director for [place] and [name].

## 2020-06-15 ENCOUNTER — Other Ambulatory Visit: Payer: Self-pay

## 2020-06-15 DIAGNOSIS — Z9889 Other specified postprocedural states: Secondary | ICD-10-CM

## 2020-06-15 DIAGNOSIS — Z8679 Personal history of other diseases of the circulatory system: Secondary | ICD-10-CM

## 2020-06-15 NOTE — Progress Notes (Signed)
Daily Session Note  Patient Details  Name: Jorge [name] MRN: [id number] Date of Birth: Aug 04, 1965 Referring Provider:   Flowsheet Row Cardiac Rehab from 05/25/2020 in [facility] Cardiac and Pulmonary Rehab  Referring Provider Thompson Grayer MD      Encounter Date: 06/15/2020  Check In:  Session Check In - 06/15/20 0715      Check-In   Supervising physician immediately available to respond to emergencies See telemetry face sheet for immediately available ER MD    Location [facility]-Cardiac & Pulmonary Rehab    Staff Present Birdie Sons, MPA, RN;Melissa Caiola RDN, Rowe Pavy, BA, ACSM CEP, Exercise Physiologist    Virtual Visit No    Medication changes reported     No    Fall or balance concerns reported    No    Warm-up and Cool-down Performed on first and last piece of equipment    Resistance Training Performed Yes    VAD Patient? No    PAD/SET Patient? No      Pain Assessment   Currently in Pain? No/denies              Social History   Tobacco Use  Smoking Status Never Smoker  Smokeless Tobacco Never Used    Goals Met:  Independence with exercise equipment Exercise tolerated well No report of cardiac concerns or symptoms Strength training completed today  Goals Unmet:  Not Applicable  Comments: Pt able to follow exercise prescription today without complaint.  Will continue to monitor for progression.    Dr. Emily Filbert is Medical Director for [place] and [name].

## 2020-06-19 ENCOUNTER — Ambulatory Visit (INDEPENDENT_AMBULATORY_CARE_PROVIDER_SITE_OTHER): Payer: Self-pay | Admitting: Thoracic Surgery (Cardiothoracic Vascular Surgery)

## 2020-06-19 ENCOUNTER — Encounter: Payer: Self-pay | Admitting: Thoracic Surgery (Cardiothoracic Vascular Surgery)

## 2020-06-19 ENCOUNTER — Other Ambulatory Visit: Payer: Self-pay

## 2020-06-19 VITALS — BP 133/82 | HR 70 | Resp 20 | Ht 70.0 in | Wt 187.0 lb

## 2020-06-19 DIAGNOSIS — Z8679 Personal history of other diseases of the circulatory system: Secondary | ICD-10-CM

## 2020-06-19 DIAGNOSIS — Z9889 Other specified postprocedural states: Secondary | ICD-10-CM

## 2020-06-19 NOTE — Patient Instructions (Signed)
Continue all previous medications without any changes at this time  You may resume unrestricted physical activity without any particular limitations at this time.   

## 2020-06-19 NOTE — Progress Notes (Signed)
[address]       [place] [address]             [phone]     CARDIOTHORACIC SURGERY OFFICE NOTE  Referring Provider is Thompson Grayer, MD Primary Cardiologist is Lauree Chandler, MD PCP is Jerrol Banana., MD   HPI:  Patient is a 55 year old male with history of coronary artery disease status post PCI and stenting of the left anterior descending coronary artery in 2019, hypertension, obstructive sleep apnea on CPAP, hyperlipidemia, and chronic recurrent paroxysmal atrial fibrillation on long-term anticoagulation whoreturns to the office today nearly 3 months status post minimally invasive maze procedure performed on March 30, 2020.  His early postoperative recovery was uneventful and he was last seen here in our office on April 24, 2020 at which time he was doing well and maintaining sinus rhythm.  He has been seen in follow-up since then by Dr. Rayann Heman, and interrogation of his loop recorder reveals he has had no episodes of recurrent atrial fibrillation since early after surgery on April 03, 2020.  He returns to our office today and reports that he is doing exceptionally well.  He is back to normal physical activity without any significant restrictions.  He does admit that the first few weeks after surgery was somewhat rough, but now he is back to completely normal activity including fairly strenuous physical exertion.  He states that his exercise tolerance is about 80% back to baseline.  He plans to enroll in the cardiac rehab program.  Overall he is very pleased with his outcome.   Current Outpatient Medications  Medication Sig Dispense Refill  . amiodarone (PACERONE) 200 MG tablet Take 1 tablet (200 mg total) by mouth daily for 56 days, THEN 0.5 tablets (100 mg total) daily. 90 tablet 3  . aspirin 81 MG EC tablet Take 81 mg by mouth daily.     Marland Kitchen aspirin-acetaminophen-caffeine (EXCEDRIN MIGRAINE) 250-250-65 MG tablet Take 2 tablets by mouth  every 6 (six) hours as needed for headache.     Marland Kitchen azelastine (ASTELIN) 0.1 % nasal spray Place 1 spray into both nostrils 2 (two) times daily. 30 mL 12  . ferrous [id number] C-folic acid (TRINSICON / FOLTRIN) capsule Take 1 capsule by mouth 2 (two) times daily after a meal. 60 capsule 1  . fluticasone (FLONASE) 50 MCG/ACT nasal spray Place 2 sprays into both nostrils daily. 16 g 3  . ibuprofen (ADVIL) 200 MG tablet Take 800 mg by mouth every 6 (six) hours as needed for headache or moderate pain.    . montelukast (SINGULAIR) 10 MG tablet Take 1 tablet (10 mg total) by mouth at bedtime. 90 tablet 3  . rosuvastatin (CRESTOR) 40 MG tablet Take 1 tablet (40 mg total) by mouth daily. 90 tablet 0  . tamsulosin (FLOMAX) 0.4 MG CAPS capsule Take 1 capsule (0.4 mg total) by mouth daily after supper. 30 capsule 3  . traMADol (ULTRAM) 50 MG tablet Take 1-2 tablets (50-100 mg total) by mouth every 4 (four) hours as needed for moderate pain. 28 tablet 0  . XARELTO 20 MG TABS tablet Take 1 tablet (20 mg total) by mouth daily with supper. 90 tablet 1   No current facility-administered medications for this visit.      Physical Exam:   BP 133/82 (BP Location: Right Arm, Patient Position: Sitting)   Pulse 70   Resp 20   Ht 5\' 10"  (1.778 m)   Wt 187  lb (84.8 kg)   SpO2 92% Comment: RA  BMI 26.83 kg/m   General:  Well-appearing  Chest:   Clear to auscultation  CV:   Regular rate and rhythm without murmur  Incisions:  Completely healed  Abdomen:  Soft nontender  Extremities:  Warm and well-perfused  Diagnostic Tests:  2 channel telemetry rhythm strip demonstrates normal sinus rhythm   Impression:  Patient is doing very well and maintaining sinus rhythm nearly 3 months status post minimally invasive maze procedure  Plan:  We have not recommended any change the patient's current medications.  We agree with plans previously outlined by Dr. Rayann Heman to slowly taper the patient off of  amiodarone.  At some point it might be reasonable to consider stopping long-term anticoagulation depending upon follow-up surveillance of data from his loop recorder.  The patient may return to unrestricted physical activity without any limitations.  All of his questions have been addressed.  The patient will continue to follow-up intermittently with Dr. Rayann Heman and return to our office for routine follow-up next December, approximately 1 year following his surgery.     Valentina Gu. Roxy Manns, MD 06/19/2020 2:35 PM

## 2020-06-20 DIAGNOSIS — Z9889 Other specified postprocedural states: Secondary | ICD-10-CM | POA: Diagnosis not present

## 2020-06-20 DIAGNOSIS — Z8679 Personal history of other diseases of the circulatory system: Secondary | ICD-10-CM

## 2020-06-20 NOTE — Progress Notes (Signed)
Daily Session Note  Patient Details  Name: Jorge [name] MRN: [id number] Date of Birth: 1966-01-13 Referring Provider:   Flowsheet Row Cardiac Rehab from 05/25/2020 in [facility] Cardiac and Pulmonary Rehab  Referring Provider Thompson Grayer MD      Encounter Date: 06/20/2020  Check In:  Session Check In - 06/20/20 0715      Check-In   Supervising physician immediately available to respond to emergencies See telemetry face sheet for immediately available ER MD    Location [facility]-Cardiac & Pulmonary Rehab    Staff Present Birdie Sons, MPA, Elveria Rising, BA, ACSM CEP, Exercise Physiologist;Kara Eliezer Bottom, MS Exercise Physiologist    Virtual Visit No    Medication changes reported     No    Fall or balance concerns reported    No    Warm-up and Cool-down Performed on first and last piece of equipment    Resistance Training Performed Yes    VAD Patient? No    PAD/SET Patient? No      Pain Assessment   Currently in Pain? No/denies              Social History   Tobacco Use  Smoking Status Never Smoker  Smokeless Tobacco Never Used    Goals Met:  Independence with exercise equipment Exercise tolerated well No report of cardiac concerns or symptoms Strength training completed today  Goals Unmet:  Not Applicable  Comments: Pt able to follow exercise prescription today without complaint.  Will continue to monitor for progression.    Dr. Emily Filbert is Medical Director for [name] and [name].

## 2020-06-21 ENCOUNTER — Encounter: Payer: Self-pay | Admitting: *Deleted

## 2020-06-21 DIAGNOSIS — Z8679 Personal history of other diseases of the circulatory system: Secondary | ICD-10-CM

## 2020-06-21 NOTE — Progress Notes (Signed)
Cardiac Individual Treatment Plan  Patient Details  Name: Jorge [name] MRN: [id number] Date of Birth: February 18, 1966 Referring Provider:   Flowsheet Row Cardiac Rehab from 05/25/2020 in [facility] Cardiac and Pulmonary Rehab  Referring Provider Thompson Grayer MD      Initial Encounter Date:  Flowsheet Row Cardiac Rehab from 05/25/2020 in [facility] Cardiac and Pulmonary Rehab  Date 05/25/20      Visit Diagnosis: Status post Maze operation for atrial fibrillation  Patient's Home Medications on Admission:  Current Outpatient Medications:  .  amiodarone (PACERONE) 200 MG tablet, Take 1 tablet (200 mg total) by mouth daily for 56 days, THEN 0.5 tablets (100 mg total) daily., Disp: 90 tablet, Rfl: 3 .  aspirin 81 MG EC tablet, Take 81 mg by mouth daily. , Disp: , Rfl:  .  aspirin-acetaminophen-caffeine (EXCEDRIN MIGRAINE) 250-250-65 MG tablet, Take 2 tablets by mouth every 6 (six) hours as needed for headache. , Disp: , Rfl:  .  azelastine (ASTELIN) 0.1 % nasal spray, Place 1 spray into both nostrils 2 (two) times daily., Disp: 30 mL, Rfl: 12 .  ferrous [id number] C-folic acid (TRINSICON / FOLTRIN) capsule, Take 1 capsule by mouth 2 (two) times daily after a meal., Disp: 60 capsule, Rfl: 1 .  fluticasone (FLONASE) 50 MCG/ACT nasal spray, Place 2 sprays into both nostrils daily., Disp: 16 g, Rfl: 3 .  ibuprofen (ADVIL) 200 MG tablet, Take 800 mg by mouth every 6 (six) hours as needed for headache or moderate pain., Disp: , Rfl:  .  montelukast (SINGULAIR) 10 MG tablet, Take 1 tablet (10 mg total) by mouth at bedtime., Disp: 90 tablet, Rfl: 3 .  rosuvastatin (CRESTOR) 40 MG tablet, Take 1 tablet (40 mg total) by mouth daily., Disp: 90 tablet, Rfl: 0 .  tamsulosin (FLOMAX) 0.4 MG CAPS capsule, Take 1 capsule (0.4 mg total) by mouth daily after supper., Disp: 30 capsule, Rfl: 3 .  traMADol (ULTRAM) 50 MG tablet, Take 1-2 tablets (50-100 mg total) by mouth every 4 (four) hours as needed for  moderate pain., Disp: 28 tablet, Rfl: 0 .  XARELTO 20 MG TABS tablet, Take 1 tablet (20 mg total) by mouth daily with supper., Disp: 90 tablet, Rfl: 1  Past Medical History: Past Medical History:  Diagnosis Date  . Allergic rhinitis, seasonal   . Anticoagulant long-term use managed by cardiology   asa and xarelto  . Bilateral nephrolithiasis    urologist--- dr Junious Silk ([place] office)  . Coronary artery disease cardiologist--- dr Angelena Form   nuclear stress test 10-18-2016 w/ normal perfusion;  cardiac cath 12-23-2017, PCI w/ DES to midLAD and mild RCA/ moderate CFx diease nonobstructive , normal LVF;  ETT 07-13-2019 negative for ischemia  . Dysrhythmia    Afib  . History of kidney stones   . Hyperlipidemia   . Hypertension    followed by pcp and cardiology  . OA (osteoarthritis)    knees  . OSA on CPAP    12-31-2019  per pt uses cpap every night  . PAF (paroxysmal atrial fibrillation) ([place]) 2011   followed by dr Rayann Heman (ep)---  first dx 2011;  s/p DCCV's and ablation x2 in 2015 and last one 03/ 2018, and s/p loop recorder 05/ 2021;   failed multaq, flecainide, and amiodarone  . Pneumonia   . Right knee meniscal tear   . S/P ablation of atrial fibrillation followed by dr allred   x3  last one 2018  . S/P drug eluting coronary stent placement  12/23/2017   x1  to midLAD  . S/P minimally-invasive maze operation for atrial fibrillation 03/30/2020   Complete bilateral atrial lesion set using cryothermy and bipolar radiofrequency ablation with clipping of LA appendage via right mini thoracotomy approach  . SOB (shortness of breath)    12-31-2019  per pt only when he has atrial fib , otherwise no issues with sob with activities  . Status post placement of implantable loop recorder 09/08/2019   followed by dr allred    Tobacco Use: Social History   Tobacco Use  Smoking Status Never Smoker  Smokeless Tobacco Never Used    Labs: Recent Review Flowsheet Data    Labs for ITP  Cardiac and Pulmonary Rehab Latest Ref Rng & Units 03/30/2020 03/30/2020 03/30/2020 03/30/2020 03/30/2020   Cholestrol 100 - 199 mg/dL - - - - -   LDLCALC 0 - 99 mg/dL - - - - -   HDL >39 mg/dL - - - - -   Trlycerides 0 - 149 mg/dL - - - - -   Hemoglobin A1c 4.8 - 5.6 % - - - - -   PHART 7.350 - 7.450 7.276(L) - 7.328(L) 7.267(L) 7.358   PCO2ART 32.0 - 48.0 mmHg 53.0(H) - 45.9 51.5(H) 34.4   HCO3 20.0 - 28.0 mmol/L 24.7 - 24.2 23.6 19.3(L)   TCO2 22 - 32 mmol/L _0 20(L)   ACIDBASEDEF 0.0 - 2.0 mmol/L 2.0 - 2.0 4.0(H) 6.0(H)   O2SAT % 93.0 - 99.0 92.0 90.0       Exercise Target Goals: Exercise Program Goal: Individual exercise prescription set using results from initial 6 min walk test and THRR while considering  patient's activity barriers and safety.   Exercise Prescription Goal: Initial exercise prescription builds to 30-45 minutes a day of aerobic activity, 2-3 days per week.  Home exercise guidelines will be given to patient during program as part of exercise prescription that the participant will acknowledge.   Education: Aerobic Exercise: - Group verbal and visual presentation on the components of exercise prescription. Introduces F.I.T.T principle from ACSM for exercise prescriptions.  Reviews F.I.T.T. principles of aerobic exercise including progression. Written material given at graduation. Flowsheet Row Cardiac Rehab from 06/15/2020 in [facility] Cardiac and Pulmonary Rehab  Date 06/15/20  Educator jh  Instruction Review Code 1- Verbalizes Understanding      Education: Resistance Exercise: - Group verbal and visual presentation on the components of exercise prescription. Introduces F.I.T.T principle from ACSM for exercise prescriptions  Reviews F.I.T.T. principles of resistance exercise including progression. Written material given at graduation.    Education: Exercise & Equipment Safety: - Individual verbal instruction and demonstration of equipment use and safety with  use of the equipment. Flowsheet Row Cardiac Rehab from 06/15/2020 in [facility] Cardiac and Pulmonary Rehab  Date 05/18/20  Educator [facility] [place]  Instruction Review Code 1- Verbalizes Understanding      Education: Exercise Physiology & General Exercise Guidelines: - Group verbal and written instruction with models to review the exercise physiology of the cardiovascular system and associated critical values. Provides general exercise guidelines with specific guidelines to those with heart or lung disease.    Education: Flexibility, Balance, Mind/Body Relaxation: - Group verbal and visual presentation with interactive activity on the components of exercise prescription. Introduces F.I.T.T principle from ACSM for exercise prescriptions. Reviews F.I.T.T. principles of flexibility and balance exercise training including progression. Also discusses the mind body connection.  Reviews various relaxation techniques to help reduce and manage stress (i.e. Deep  breathing, progressive muscle relaxation, and visualization). Balance handout provided to take home. Written material given at graduation.   Activity Barriers & Risk Stratification:  Activity Barriers & Cardiac Risk Stratification - 05/25/20 0928      Activity Barriers & Cardiac Risk Stratification   Activity Barriers Joint Problems;Shortness of Breath;Muscular Weakness;Other (comment);Incisional Pain    Comments bilateral knee surgery (most recent meniscus tear in 9/20200, incisional soreness, weakness in chest from incision    Cardiac Risk Stratification Moderate           6 Minute Walk:  6 Minute Walk    Row Name 05/25/20 0926         6 Minute Walk   Phase Initial     Distance 1515 feet     Walk Time 6 minutes     # of Rest Breaks 0     MPH 2.87     METS 4.34     RPE 10     Perceived Dyspnea  1.5     VO2 Peak 15.19     Symptoms Yes (comment)     Comments incisional soreness 2/10     Resting HR 93 bpm     Resting BP 122/64     Resting  Oxygen Saturation  98 %     Exercise Oxygen Saturation  during 6 min walk 96 %     Max Ex. HR 108 bpm     Max Ex. BP 134/64     2 Minute Post BP 108/60            Oxygen Initial Assessment:   Oxygen Re-Evaluation:   Oxygen Discharge (Final Oxygen Re-Evaluation):   Initial Exercise Prescription:  Initial Exercise Prescription - 05/25/20 0900      Date of Initial Exercise RX and Referring Provider   Date 05/25/20    Referring Provider Thompson Grayer MD      Treadmill   MPH 2.8    Grade 2    Minutes 15    METs 3.92      Recumbant Elliptical   Level 2    RPM 50    Minutes 15    METs 3      Elliptical   Level 1    Speed 4.4    Minutes 15      Prescription Details   Frequency (times per week) 2    Duration Progress to 30 minutes of continuous aerobic without signs/symptoms of physical distress      Intensity   THRR 40-80% of Max Heartrate 122-151    Ratings of Perceived Exertion 11-13    Perceived Dyspnea 0-4      Progression   Progression Continue to progress workloads to maintain intensity without signs/symptoms of physical distress.      Resistance Training   Training Prescription Yes    Weight 6 lb    Reps 10-15           Perform Capillary Blood Glucose checks as needed.  Exercise Prescription Changes:  Exercise Prescription Changes    Row Name 05/25/20 0900 06/05/20 1700           Response to Exercise   Blood Pressure (Admit) 122/64 132/82      Blood Pressure (Exercise) 134/64 122/66      Blood Pressure (Exit) 108/60 112/62      Heart Rate (Admit) 93 bpm 94 bpm      Heart Rate (Exercise) 108 bpm 120 bpm      Heart Rate (  Exit) 96 bpm 105 bpm      Oxygen Saturation (Admit) 97 % --      Oxygen Saturation (Exercise) 96 % --      Rating of Perceived Exertion (Exercise) 10 13      Perceived Dyspnea (Exercise) 1.5 --      Symptoms incisional soreness 2/10 --      Comments walk test results second day             Progression   Progression  -- Continue to progress workloads to maintain intensity without signs/symptoms of physical distress.      Average METs -- 4             Resistance Training   Training Prescription -- Yes      Weight -- 6 lb      Reps -- 10-15             Treadmill   MPH -- 3.5      Grade -- 2      Minutes -- 15      METs -- 4.64             Exercise Comments:  Exercise Comments    Row Name 05/30/20 0807           Exercise Comments First full day of exercise!  Patient was oriented to gym and equipment including functions, settings, policies, and procedures.  Patient's individual exercise prescription and treatment plan were reviewed.  All starting workloads were established based on the results of the 6 minute walk test done at initial orientation visit.  The plan for exercise progression was also introduced and progression will be customized based on patient's performance and goals.              Exercise Goals and Review:  Exercise Goals    Row Name 05/25/20 0930             Exercise Goals   Increase Physical Activity Yes       Intervention Provide advice, education, support and counseling about physical activity/exercise needs.;Develop an individualized exercise prescription for aerobic and resistive training based on initial evaluation findings, risk stratification, comorbidities and participant's personal goals.       Expected Outcomes Short Term: Attend rehab on a regular basis to increase amount of physical activity.;Long Term: Add in home exercise to make exercise part of routine and to increase amount of physical activity.;Long Term: Exercising regularly at least 3-5 days a week.       Increase Strength and Stamina Yes       Intervention Provide advice, education, support and counseling about physical activity/exercise needs.;Develop an individualized exercise prescription for aerobic and resistive training based on initial evaluation findings, risk stratification, comorbidities  and participant's personal goals.       Expected Outcomes Short Term: Increase workloads from initial exercise prescription for resistance, speed, and METs.;Long Term: Improve cardiorespiratory fitness, muscular endurance and strength as measured by increased METs and functional capacity (6MWT);Short Term: Perform resistance training exercises routinely during rehab and add in resistance training at home       Able to understand and use rate of perceived exertion (RPE) scale Yes       Intervention Provide education and explanation on how to use RPE scale       Expected Outcomes Short Term: Able to use RPE daily in rehab to express subjective intensity level;Long Term:  Able to use RPE to guide intensity level when exercising  independently       Able to understand and use Dyspnea scale Yes       Intervention Provide education and explanation on how to use Dyspnea scale       Expected Outcomes Short Term: Able to use Dyspnea scale daily in rehab to express subjective sense of shortness of breath during exertion;Long Term: Able to use Dyspnea scale to guide intensity level when exercising independently       Knowledge and understanding of Target Heart Rate Range (THRR) Yes       Intervention Provide education and explanation of THRR including how the numbers were predicted and where they are located for reference       Expected Outcomes Short Term: Able to state/look up THRR;Short Term: Able to use daily as guideline for intensity in rehab;Long Term: Able to use THRR to govern intensity when exercising independently       Able to check pulse independently Yes       Intervention Provide education and demonstration on how to check pulse in carotid and radial arteries.;Review the importance of being able to check your own pulse for safety during independent exercise       Expected Outcomes Short Term: Able to explain why pulse checking is important during independent exercise;Long Term: Able to check pulse  independently and accurately       Understanding of Exercise Prescription Yes       Intervention Provide education, explanation, and written materials on patient's individual exercise prescription       Expected Outcomes Short Term: Able to explain program exercise prescription;Long Term: Able to explain home exercise prescription to exercise independently              Exercise Goals Re-Evaluation :  Exercise Goals Re-Evaluation    Row Name 05/30/20 0808 06/05/20 1710           Exercise Goal Re-Evaluation   Exercise Goals Review Understanding of Exercise Prescription;Knowledge and understanding of Target Heart Rate Range (THRR);Able to understand and use rate of perceived exertion (RPE) scale;Able to understand and use Dyspnea scale --      Comments Reviewed RPE and dyspnea scales, THR and program prescription with pt today.  Pt voiced understanding and was given a copy of goals to take home. Cuyler has tolerated exercise well in his first 2 sessions.  We will monitor progress.      Expected Outcomes Short: Use RPE daily to regulate intensity. Long: Follow program prescription in THR. Short: attend consistently Long: improve MET level             Discharge Exercise Prescription (Final Exercise Prescription Changes):  Exercise Prescription Changes - 06/05/20 1700      Response to Exercise   Blood Pressure (Admit) 132/82    Blood Pressure (Exercise) 122/66    Blood Pressure (Exit) 112/62    Heart Rate (Admit) 94 bpm    Heart Rate (Exercise) 120 bpm    Heart Rate (Exit) 105 bpm    Rating of Perceived Exertion (Exercise) 13    Comments second day      Progression   Progression Continue to progress workloads to maintain intensity without signs/symptoms of physical distress.    Average METs 4      Resistance Training   Training Prescription Yes    Weight 6 lb    Reps 10-15      Treadmill   MPH 3.5    Grade 2    Minutes 15  METs 4.64           Nutrition:  Target  Goals: Understanding of nutrition guidelines, daily intake of sodium <1547m, cholesterol <2032m calories 30% from fat and 7% or less from saturated fats, daily to have 5 or more servings of fruits and vegetables.  Education: All About Nutrition: -Group instruction provided by verbal, written material, interactive activities, discussions, models, and posters to present general guidelines for heart healthy nutrition including fat, fiber, MyPlate, the role of sodium in heart healthy nutrition, utilization of the nutrition label, and utilization of this knowledge for meal planning. Follow up email sent as well. Written material given at graduation. [facility]  Education need identified 05/24/20      Biometrics:  Pre Biometrics - 05/25/20 0931      Pre Biometrics   Height 5' 10.5" (1.791 m)    Weight 190 lb 3.2 oz (86.3 kg)    BMI (Calculated) 26.9    Single Leg Stand 28.8 seconds            Nutrition Therapy Plan and Nutrition Goals:  Nutrition Therapy & Goals - 05/25/20 0925      Nutrition Therapy   Diet Heart healthy, low Na    Drug/Food Interactions Statins/Certain Fruits    Protein (specify units) 70g    Fiber 30 grams    Whole Grain Foods 3 servings    Saturated Fats 12 max. grams    Fruits and Vegetables 8 servings/day    Sodium 1.5 grams      Personal Nutrition Goals   Nutrition Goal ST: check almond milk for fortification, reduce sodium using some of the techniques discussed (citrus, vinegar, etc), increase variety, try "nice" cream LT: fiber 30g/day, 8 fruits and vegetables per day    Comments He reports limiting red meat, eating pork chops 1x/month, chicken and fish mostly. He reports eating whole grains and having a garden of his own vegetables he eats over the summer. he drinks almond milk or 2% milk. He likes oikos yogurt. For breakfast he will have oatmeal or grits with fruit and ground flax; on the  weekned he will have bacon and eggs. L: sandwich (tu[place]r peanut butter and jelly) D: chicken, fish more during the summer. During the winter they eat more crockpot meals like chili. Vegetables: leafy greens, string beans, butter beans, peas. Fruit: bananas, apples, clementines, fruit medley. S: ice cream or some fruit. Discussed heart healhty eating. Recommended trying "nice" cream (made with frozen bananas).      Intervention Plan   Intervention Prescribe, educate and counsel regarding individualized specific dietary modifications aiming towards targeted core components such as weight, hypertension, lipid management, diabetes, heart failure and other comorbidities.;Nutrition handout(s) given to patient.    Expected Outcomes Short Term Goal: Understand basic principles of dietary content, such as calories, fat, sodium, cholesterol and nutrients.;Short Term Goal: A plan has been developed with personal nutrition goals set during dietitian appointment.;Long Term Goal: Adherence to prescribed nutrition plan.           Nutrition Assessments:  MEDIFICTS Score Key:  ?70 Need to make dietary changes   40-70 Heart Healthy Diet  ? 40 Therapeutic Level Cholesterol Diet  [facility]  Picture Your Plate Total Score on Admission 83     Picture Your Plate Scores:  <4<16nhealthy dietary pattern with much room for improvement.  41-50 Dietary  pattern unlikely to meet recommendations for good health and room for improvement.  51-60 More healthful dietary pattern, with some room for improvement.   >60 Healthy dietary pattern, although there may be some specific behaviors that could be improved.    Nutrition Goals Re-Evaluation:   Nutrition Goals Discharge (Final Nutrition Goals Re-Evaluation):   Psychosocial: Target Goals: Acknowledge presence or absence of significant depression and/or stress, maximize coping skills,  provide positive support system. Participant is able to verbalize types and ability to use techniques and skills needed for reducing stress and depression.   Education: Stress, Anxiety, and Depression - Group verbal and visual presentation to define topics covered.  Reviews how body is impacted by stress, anxiety, and depression.  Also discusses healthy ways to reduce stress and to treat/manage anxiety and depression.  Written material given at graduation. Flowsheet Row Cardiac Rehab [facility]15/[facility] in [facility] Cardiac and Pulmonary Rehab  Education need identified 05/24/20      Education: Sleep Hygiene -Provides group verbal and written instruction about how sleep can affect your health.  Define sleep hygiene, discuss sleep cycles and impact of sleep habits. Review good sleep hygiene tips.    Initial Review & Psychosocial Screening:  Initial Psych Review & Screening - 05/18/20 1013      Initial Review   Current issues with None Identified      Family Dynamics   Good Support System? Yes    Comments He can look to his son and his wife for support. He has a good outlook on his health after his procedure and is ready to start exercising.      Barriers   Psychosocial barriers to participate in program There are no identifiable barriers or psychosocial needs.      Screening Interventions   Interventions To provide support and resources with identified psychosocial needs;Provide feedback about the scores to participant    Expected Outcomes Short Term goal: Utilizing psychosocial counselor, staff and physician to assist with identification of specific Stressors or current issues interfering with healing process. Setting desired goal for each stressor or current issue identified.;Long Term Goal: Stressors or current issues are controlled or eliminated.;Short Term goal: Identification and review with participant of any Quality of Life or Depression concerns found by scoring the questionnaire.;Long Term  goal: The participant improves quality of Life and PHQ9 Scores as seen by post scores and/or verbalization of changes           Quality of Life Scores:   Quality of Life - 05/24/20 1313      Quality of Life   Select Quality of Life      Quality of Life Scores   Health/Function Pre 25.07 %    Socioeconomic Pre 26.25 %    Psych/Spiritual Pre 28.07 %    Family Pre 28.8 %    GLOBAL Pre 26.47 %          Scores of 19 and below usually indicate a poorer quality of life in these areas.  A difference of  2-3 points is a clinically meaningful difference.  A difference of 2-3 points in the total score of the Quality of Life Index has been associated with significant improvement in overall quality of life, self-image, physical symptoms, and general health in studies assessing change in quality of life.  PHQ-9: Recent Review Flowsheet Data    Depression screen [facility] 2/9 05/25/2020 04/06/2019 07/30/2018 06/29/2018 03/31/2018   Decreased Interest 0 0 0 0 0   Down, Depressed, Hopeless 0  0 0 0 0   PHQ - 2 Score 0 0 0 0 0   Altered sleeping 1 0 - - -   Tired, decreased energy 0 0 - - -   Change in appetite 0 0 - - -   Feeling bad or failure about yourself  0 0 - - -   Trouble concentrating 0 0 - - -   Moving slowly or fidgety/restless 0 0 - - -   Suicidal thoughts 0 0 - - -   PHQ-9 Score 1 0 - - -   Difficult doing work/chores Not difficult at all Not difficult at all - - -     Interpretation of Total Score  Total Score Depression Severity:  1-4 = Minimal depression, 5-9 = Mild depression, 10-14 = Moderate depression, 15-19 = Moderately severe depression, 20-27 = Severe depression   Psychosocial Evaluation and Intervention:  Psychosocial Evaluation - 05/18/20 1014      Psychosocial Evaluation & Interventions   Interventions Encouraged to exercise with the program and follow exercise prescription;Relaxation education;Stress management education    Comments He can look to his son and his wife  for support. He has a good outlook on his health after his procedure and is ready to start exercising.    Expected Outcomes Short: Exercise regularly to support mental health and notify staff of any changes. Long: maintain mental health and well being through teaching of rehab or prescribed medications independently.    Continue Psychosocial Services  Follow up required by staff           Psychosocial Re-Evaluation:   Psychosocial Discharge (Final Psychosocial Re-Evaluation):   Vocational Rehabilitation: Provide vocational rehab assistance to qualifying candidates.   Vocational Rehab Evaluation & Intervention:   Education: Education Goals: Education classes will be provided on a variety of topics geared toward better understanding of heart health and risk factor modification. Participant will state understanding/return demonstration of topics presented as noted by education test scores.  Learning Barriers/Preferences:  Learning Barriers/Preferences - 05/18/20 1009      Learning Barriers/Preferences   Learning Barriers None    Learning Preferences None           General Cardiac Education Topics:  AED/CPR: - Group verbal and written instruction with the use of models to demonstrate the basic use of the AED with the basic ABC's of resuscitation.   Anatomy and Cardiac Procedures: - Group verbal and visual presentation and models provide information about basic cardiac anatomy and function. Reviews the testing methods done to diagnose heart disease and the outcomes of the test results. Describes the treatment choices: Medical Management, Angioplasty, or Coronary Bypass Surgery for treating various heart conditions including Myocardial Infarction, Angina, Valve Disease, and Cardiac Arrhythmias.  Written material given at graduation.   Medication Safety: - Group verbal and visual instruction to review commonly prescribed medications for heart and lung disease. Reviews the  medication, class of the drug, and side effects. Includes the steps to properly store meds and maintain the prescription regimen.  Written material given at graduation.   Intimacy: - Group verbal instruction through game format to discuss how heart and lung disease can affect sexual intimacy. Written material given at graduation.. Flowsheet Row Cardiac Rehab from 06/15/2020 in [facility] Cardiac and Pulmonary Rehab  Date 06/15/20  Educator jh  Instruction Review Code 1- Verbalizes Understanding      Know Your Numbers and Heart Failure: - Group verbal and visual instruction to discuss disease risk factors for cardiac  and pulmonary disease and treatment options.  Reviews associated critical values for Overweight/Obesity, Hypertension, Cholesterol, and Diabetes.  Discusses basics of heart failure: signs/symptoms and treatments.  Introduces Heart Failure Zone chart for action plan for heart failure.  Written material given at graduation.   Infection Prevention: - Provides verbal and written material to individual with discussion of infection control including proper hand washing and proper equipment cleaning during exercise session. Flowsheet Row Cardiac Rehab from 06/15/2020 in [facility] Cardiac and Pulmonary Rehab  Date 05/18/20  Educator [facility]  Instruction Review Code 1- Verbalizes Understanding      Falls Prevention: - Provides verbal and written material to individual with discussion of falls prevention and safety. Flowsheet Row Cardiac Rehab from 06/15/2020 in [facility] Cardiac and Pulmonary Rehab  Date 05/18/20  Educator [facility]  Instruction Review Code 1- Verbalizes Understanding      Other: -Provides group and verbal instruction on various topics (see comments)   Knowledge Questionnaire Score:  Knowledge Questionnaire Score - 05/24/20 1313      Knowledge Questionnaire Score   Pre Score 23/26 Education Focus: Depression, nutrition           Core Components/Risk Factors/Patient Goals at  Admission:  Personal Goals and Risk Factors at Admission - 05/25/20 0932      Core Components/Risk Factors/Patient Goals on Admission    Weight Management Yes;Weight Loss    Intervention Weight Management: Develop a combined nutrition and exercise program designed to reach desired caloric intake, while maintaining appropriate intake of nutrient and fiber, sodium and fats, and appropriate energy expenditure required for the weight goal.;Weight Management: Provide education and appropriate resources to help participant work on and attain dietary goals.;Weight Management/Obesity: Establish reasonable short term and long term weight goals.    Admit Weight 190 lb 3.2 oz (86.3 kg)    Goal Weight: Short Term 185 lb (83.9 kg)    Goal Weight: Long Term 185 lb (83.9 kg)    Expected Outcomes Short Term: Continue to assess and modify interventions until short term weight is achieved;Long Term: Adherence to nutrition and physical activity/exercise program aimed toward attainment of established weight goal;Understanding recommendations for meals to include 15-35% energy as protein, 25-35% energy from fat, 35-60% energy from carbohydrates, less than 290m of dietary cholesterol, 20-35 gm of total fiber daily;Understanding of distribution of calorie intake throughout the day with the consumption of 4-5 meals/snacks;Weight Loss: Understanding of general recommendations for a balanced deficit meal plan, which promotes 1-2 lb weight loss per week and includes a negative energy balance of [id number] kcal/d;Weight Maintenance: Understanding of the daily nutrition guidelines, which includes 25-35% calories from fat, 7% or less cal from saturated fats, less than 2013mcholesterol, less than 1.5gm of sodium, & 5 or more servings of fruits and vegetables daily    Hypertension Yes    Intervention Provide education on lifestyle modifcations including regular physical activity/exercise, weight management, moderate sodium restriction  and increased consumption of fresh fruit, vegetables, and low fat dairy, alcohol moderation, and smoking cessation.;Monitor prescription use compliance.    Expected Outcomes Short Term: Continued assessment and intervention until BP is < 140/9049mG in hypertensive participants. < 130/72m47m in hypertensive participants with diabetes, heart failure or chronic kidney disease.;Long Term: Maintenance of blood pressure at goal levels.    Lipids Yes    Intervention Provide education and support for participant on nutrition & aerobic/resistive exercise along with prescribed medications to achieve LDL <70mg76mL >40mg.3mExpected Outcomes Short Term: Participant states  understanding of desired cholesterol values and is compliant with medications prescribed. Participant is following exercise prescription and nutrition guidelines.;Long Term: Cholesterol controlled with medications as prescribed, with individualized exercise RX and with personalized nutrition plan. Value goals: LDL < 74m, HDL > 40 mg.           Education:Diabetes - Individual verbal and written instruction to review signs/symptoms of diabetes, desired ranges of glucose level fasting, after meals and with exercise. Acknowledge that pre and post exercise glucose checks will be done for 3 sessions at entry of program.   Core Components/Risk Factors/Patient Goals Review:    Core Components/Risk Factors/Patient Goals at Discharge (Final Review):    ITP Comments:  ITP Comments    Row Name 05/18/20 1009 05/25/20 0926 05/30/20 0807 06/21/20 0610     ITP Comments Virtual Visit completed. Patient informed on EP and RD appointment and 6 Minute walk test. Patient also informed of patient health questionnaires on My Chart. Patient Verbalizes understanding. [facility]. Completed 6MWT and gym orientation. Initial ITP created and sent for review to Dr. MEmily Filbert Medical Director. First full day of exercise!   Patient was oriented to gym and equipment including functions, settings, policies, and procedures.  Patient's individual exercise prescription and treatment plan were reviewed.  All starting workloads were established based on the results of the 6 minute walk test done at initial orientation visit.  The plan for exercise progression was also introduced and progression will be customized based on patient's performance and goals. 30 Day review completed. Medical Director ITP review done, changes made as directed, and signed approval by Medical Director.           Comments:

## 2020-06-22 ENCOUNTER — Other Ambulatory Visit: Payer: Self-pay

## 2020-06-22 DIAGNOSIS — Z9889 Other specified postprocedural states: Secondary | ICD-10-CM

## 2020-06-22 DIAGNOSIS — Z8679 Personal history of other diseases of the circulatory system: Secondary | ICD-10-CM

## 2020-06-22 NOTE — Progress Notes (Signed)
Daily Session Note  Patient Details  Name: Jorge [name] MRN: [id number] Date of Birth: [birth year] Referring Provider:   Flowsheet Row Cardiac Rehab from 05/25/2020 in [facility] Cardiac and Pulmonary Rehab  Referring Provider Thompson Grayer MD      Encounter Date: 06/22/2020  Check In:  Session Check In - 06/22/20 0724      Check-In   Supervising physician immediately available to respond to emergencies See telemetry face sheet for immediately available ER MD    Location [facility]-Cardiac & Pulmonary Rehab    Staff Present Hope Budds RDN, Luther Redo, MPA, RN;Amanda Sommer, BA, ACSM CEP, Exercise Physiologist    Virtual Visit No    Medication changes reported     No    Fall or balance concerns reported    No    Warm-up and Cool-down Performed on first and last piece of equipment    Resistance Training Performed Yes    VAD Patient? No    PAD/SET Patient? No      Pain Assessment   Currently in Pain? No/denies              Social History   Tobacco Use  Smoking Status Never Smoker  Smokeless Tobacco Never Used    Goals Met:  Independence with exercise equipment Exercise tolerated well No report of cardiac concerns or symptoms Strength training completed today  Goals Unmet:  Not Applicable  Comments: Pt able to follow exercise prescription today without complaint.  Will continue to monitor for progression.  Reviewed home exercise with pt today.  Pt plans to continue walking and using his elliptical at home for exercise.  Reviewed THR, pulse, RPE, sign and symptoms, pulse oximetery and when to call 911 or MD.  Also discussed weather considerations and indoor options.  Pt voiced understanding.   Dr. Emily Filbert is Medical Director for [place] and [name].

## 2020-06-22 NOTE — Progress Notes (Signed)
Daily Session Note  Patient Details  Name: VIRAJ LIBY MRN: [id number] Date of Birth: 17-May-1965 Referring Provider:   Flowsheet Row Cardiac Rehab from 05/25/2020 in [facility] Cardiac and Pulmonary Rehab  Referring Provider Thompson Grayer MD      Encounter Date: 06/22/2020  Check In:  Session Check In - 06/22/20 0724      Check-In   Supervising physician immediately available to respond to emergencies See telemetry face sheet for immediately available ER MD    Location [facility]-Cardiac & Pulmonary Rehab    Staff Present Hope Budds RDN, Luther Redo, MPA, RN;Amanda Sommer, BA, ACSM CEP, Exercise Physiologist    Virtual Visit No    Medication changes reported     No    Fall or balance concerns reported    No    Warm-up and Cool-down Performed on first and last piece of equipment    Resistance Training Performed Yes    VAD Patient? No    PAD/SET Patient? No      Pain Assessment   Currently in Pain? No/denies             Exercise Prescription Changes - 06/22/20 0700      Home Exercise Plan   Plans to continue exercise at Home (comment)   walking, elliptical   Frequency Add 3 additional days to program exercise sessions.    Initial Home Exercises Provided 06/22/20           Social History   Tobacco Use  Smoking Status Never Smoker  Smokeless Tobacco Never Used    Goals Met:  Independence with exercise equipment Exercise tolerated well No report of cardiac concerns or symptoms Strength training completed today  Goals Unmet:  Not Applicable  Comments: Pt able to follow exercise prescription today without complaint.  Will continue to monitor for progression.    Dr. Emily Filbert is Medical Director for [place] and [name].

## 2020-06-27 ENCOUNTER — Other Ambulatory Visit: Payer: Self-pay

## 2020-06-27 ENCOUNTER — Encounter: Payer: BC Managed Care – PPO | Attending: Internal Medicine | Admitting: *Deleted

## 2020-06-27 DIAGNOSIS — Z9889 Other specified postprocedural states: Secondary | ICD-10-CM | POA: Insufficient documentation

## 2020-06-27 DIAGNOSIS — Z8679 Personal history of other diseases of the circulatory system: Secondary | ICD-10-CM | POA: Insufficient documentation

## 2020-06-27 NOTE — Progress Notes (Signed)
Daily Session Note  Patient Details  Name: HUCK ASHWORTH MRN: [id number] Date of Birth: 1965-08-19 Referring Provider:   Flowsheet Row Cardiac Rehab from 05/25/2020 in [facility] Cardiac and Pulmonary Rehab  Referring Provider Thompson Grayer MD      Encounter Date: 06/27/2020  Check In:  Session Check In - 06/27/20 5930      Check-In   Supervising physician immediately available to respond to emergencies See telemetry face sheet for immediately available ER MD    Location [facility]-Cardiac & Pulmonary Rehab    Staff Present Heath Lark, RN, BSN, Lance Sell, BA, ACSM CEP, Exercise Physiologist;Kara Eliezer Bottom, MS Exercise Physiologist    Virtual Visit No    Medication changes reported     No    Fall or balance concerns reported    No    Warm-up and Cool-down Performed on first and last piece of equipment    Resistance Training Performed Yes    VAD Patient? No    PAD/SET Patient? No      Pain Assessment   Currently in Pain? No/denies              Social History   Tobacco Use  Smoking Status Never Smoker  Smokeless Tobacco Never Used    Goals Met:  Independence with exercise equipment Exercise tolerated well No report of cardiac concerns or symptoms  Goals Unmet:  Not Applicable  Comments: Pt able to follow exercise prescription today without complaint.  Will continue to monitor for progression.    Dr. Emily Filbert is Medical Director for [place] and [name].

## 2020-06-29 ENCOUNTER — Other Ambulatory Visit: Payer: Self-pay

## 2020-06-29 DIAGNOSIS — Z8679 Personal history of other diseases of the circulatory system: Secondary | ICD-10-CM

## 2020-06-29 DIAGNOSIS — Z9889 Other specified postprocedural states: Secondary | ICD-10-CM

## 2020-06-29 NOTE — Progress Notes (Signed)
Daily Session Note  Patient Details  Name: Jorge [name] MRN: [id number] Date of Birth: 10-May-1965 Referring Provider:   Flowsheet Row Cardiac Rehab from 05/25/2020 in [facility] Cardiac and Pulmonary Rehab  Referring Provider Thompson Grayer MD      Encounter Date: 06/29/2020  Check In:  Session Check In - 06/29/20 0730      Check-In   Supervising physician immediately available to respond to emergencies See telemetry face sheet for immediately available ER MD    Location [facility]-Cardiac & Pulmonary Rehab    Staff Present Birdie Sons, MPA, RN;Melissa Caiola RDN, LDN;Jessica Luan Pulling, MA, RCEP, CCRP, CCET    Virtual Visit No    Medication changes reported     No    Fall or balance concerns reported    No    Warm-up and Cool-down Performed on first and last piece of equipment    Resistance Training Performed Yes    VAD Patient? No    PAD/SET Patient? No      Pain Assessment   Currently in Pain? No/denies              Social History   Tobacco Use  Smoking Status Never Smoker  Smokeless Tobacco Never Used    Goals Met:  Independence with exercise equipment Exercise tolerated well Personal goals reviewed No report of cardiac concerns or symptoms Strength training completed today  Goals Unmet:  Not Applicable  Comments: Pt able to follow exercise prescription today without complaint.  Will continue to monitor for progression.    Dr. Emily Filbert is Medical Director for [place] and [name].

## 2020-07-06 ENCOUNTER — Other Ambulatory Visit: Payer: Self-pay

## 2020-07-06 DIAGNOSIS — Z9889 Other specified postprocedural states: Secondary | ICD-10-CM

## 2020-07-06 DIAGNOSIS — Z8679 Personal history of other diseases of the circulatory system: Secondary | ICD-10-CM

## 2020-07-06 NOTE — Progress Notes (Signed)
Daily Session Note  Patient Details  Name: Jorge [name] MRN: [id number] Date of Birth: 08/11/1965 Referring Provider:   Flowsheet Row Cardiac Rehab from 05/25/2020 in [facility] Cardiac and Pulmonary Rehab  Referring Provider Allred, James MD      Encounter Date: 07/06/2020  Check In:  Session Check In - 07/06/20 0736      Check-In   Supervising physician immediately available to respond to emergencies See telemetry face sheet for immediately available ER MD    Location [facility]-Cardiac & Pulmonary Rehab    Staff Present Kelly Bollinger, MPA, RN;Amanda Sommer, BA, ACSM CEP, Exercise Physiologist;Kelly Hayes, BS, ACSM CEP, Exercise Physiologist    Virtual Visit No    Medication changes reported     No    Fall or balance concerns reported    No    Warm-up and Cool-down Performed on first and last piece of equipment    Resistance Training Performed Yes    VAD Patient? No    PAD/SET Patient? No      Pain Assessment   Currently in Pain? No/denies              Social History   Tobacco Use  Smoking Status Never Smoker  Smokeless Tobacco Never Used    Goals Met:  Independence with exercise equipment Exercise tolerated well No report of cardiac concerns or symptoms Strength training completed today  Goals Unmet:  Not Applicable  Comments: Pt able to follow exercise prescription today without complaint.  Will continue to monitor for progression.    Dr. Mark Miller is Medical Director for [facility] and [name]. 

## 2020-07-10 ENCOUNTER — Ambulatory Visit (INDEPENDENT_AMBULATORY_CARE_PROVIDER_SITE_OTHER): Payer: BC Managed Care – PPO

## 2020-07-10 DIAGNOSIS — I4891 Unspecified atrial fibrillation: Secondary | ICD-10-CM | POA: Diagnosis not present

## 2020-07-11 ENCOUNTER — Other Ambulatory Visit: Payer: Self-pay

## 2020-07-11 ENCOUNTER — Other Ambulatory Visit: Payer: Self-pay | Admitting: Family Medicine

## 2020-07-11 DIAGNOSIS — Z9889 Other specified postprocedural states: Secondary | ICD-10-CM

## 2020-07-11 DIAGNOSIS — E785 Hyperlipidemia, unspecified: Secondary | ICD-10-CM

## 2020-07-11 DIAGNOSIS — Z8679 Personal history of other diseases of the circulatory system: Secondary | ICD-10-CM

## 2020-07-11 NOTE — Telephone Encounter (Signed)
Courtesy refill given, appointment scheduled in 2 weeks.

## 2020-07-11 NOTE — Telephone Encounter (Signed)
Requested medication (s) are due for refill today: yes  Requested medication (s) are on the active medication list: yes  Last refill:  04/10/20  Future visit scheduled: yes  Notes to clinic:  overdue lab work -   Requested Prescriptions  Pending Prescriptions Disp Refills   rosuvastatin (CRESTOR) 40 MG tablet [Pharmacy Med Name: ROSUVASTATIN CALCIUM 40 MG TAB] 90 tablet 0    Sig: Take 1 tablet (40 mg total) by mouth daily.      Cardiovascular:  Antilipid - Statins Failed - 07/11/2020 11:55 AM      Failed - Total Cholesterol in normal range and within 360 days    Cholesterol, Total  Date Value Ref Range Status  04/06/2019 147 100 - 199 mg/dL Final          Failed - LDL in normal range and within 360 days    LDL Cholesterol (Calc)  Date Value Ref Range Status  03/27/2017 123 (H) mg/dL (calc) Final    Comment:    Reference range: <100 . Desirable range <100 mg/dL for primary prevention;   <70 mg/dL for patients with CHD or diabetic patients  with > or = 2 CHD risk factors. Marland Kitchen LDL-C is now calculated using the Martin-Hopkins  calculation, which is a validated novel method providing  better accuracy than the Friedewald equation in the  estimation of LDL-C.  Cresenciano Genre et al. Annamaria Helling. 7829;562(13): 2061-2068  (http://education.[employer].com/faq/FAQ164)    LDL Chol Calc (NIH)  Date Value Ref Range Status  04/06/2019 70 0 - 99 mg/dL Final          Failed - HDL in normal range and within 360 days    HDL  Date Value Ref Range Status  04/06/2019 61 >39 mg/dL Final          Failed - Triglycerides in normal range and within 360 days    Triglycerides  Date Value Ref Range Status  04/06/2019 83 0 - 149 mg/dL Final          Passed - Patient is not pregnant      Passed - Valid encounter within last 12 months    Recent Outpatient Visits           3 months ago Sinusitis, unspecified chronicity, unspecified location   [place], Vickki Muff,  PA-C   6 months ago Essential hypertension   [facility] Jerrol Banana., MD   1 year ago Annual physical exam   [facility] Jerrol Banana., MD   1 year ago Hyperlipidemia, unspecified hyperlipidemia type   [facility] Jerrol Banana., MD   1 year ago Hyperlipidemia, unspecified hyperlipidemia type   [facility] Jerrol Banana., MD       Future Appointments             In 2 weeks Jerrol Banana., MD [facility] [place]

## 2020-07-11 NOTE — Progress Notes (Signed)
Daily Session Note  Patient Details  Name: Jorge [name] MRN: [id number] Date of Birth: 04/25/1966 Referring Provider:   Flowsheet Row Cardiac Rehab from 05/25/2020 in [facility] Cardiac and Pulmonary Rehab  Referring Provider Allred, James MD      Encounter Date: 07/11/2020  Check In:  Session Check In - 07/11/20 0721      Check-In   Supervising physician immediately available to respond to emergencies See telemetry face sheet for immediately available ER MD    Location [facility]-Cardiac & Pulmonary Rehab    Staff Present Kelly Bollinger, MPA, RN;Amanda Sommer, BA, ACSM CEP, Exercise Physiologist;Kara Langdon, MS Exercise Physiologist    Virtual Visit No    Medication changes reported     No    Fall or balance concerns reported    No    Warm-up and Cool-down Performed on first and last piece of equipment    Resistance Training Performed Yes    VAD Patient? No    PAD/SET Patient? No      Pain Assessment   Currently in Pain? No/denies              Social History   Tobacco Use  Smoking Status Never Smoker  Smokeless Tobacco Never Used    Goals Met:  Independence with exercise equipment Exercise tolerated well No report of cardiac concerns or symptoms Strength training completed today  Goals Unmet:  Not Applicable  Comments: Pt able to follow exercise prescription today without complaint.  Will continue to monitor for progression.    Dr. Mark Miller is Medical Director for [facility] and [name]. 

## 2020-07-12 LAB — CUP PACEART REMOTE DEVICE CHECK
Date Time Interrogation Session: 20220314071613
Implantable Pulse Generator Implant Date: 20210512

## 2020-07-13 ENCOUNTER — Other Ambulatory Visit: Payer: Self-pay

## 2020-07-13 DIAGNOSIS — Z9889 Other specified postprocedural states: Secondary | ICD-10-CM

## 2020-07-13 DIAGNOSIS — Z8679 Personal history of other diseases of the circulatory system: Secondary | ICD-10-CM

## 2020-07-13 NOTE — Progress Notes (Signed)
Daily Session Note  Patient Details  Name: DE JAWORSKI MRN: [id number] Date of Birth: 11/05/1965 Referring Provider:   Flowsheet Row Cardiac Rehab from 05/25/2020 in [facility] Cardiac and Pulmonary Rehab  Referring Provider Thompson Grayer MD      Encounter Date: 07/13/2020  Check In:  Session Check In - 07/13/20 0730      Check-In   Supervising physician immediately available to respond to emergencies See telemetry face sheet for immediately available ER MD    Location [facility]-Cardiac & Pulmonary Rehab    Staff Present Birdie Sons, MPA, Mauricia Area, BS, ACSM CEP, Exercise Physiologist;Amanda Oletta Darter, BA, ACSM CEP, Exercise Physiologist    Virtual Visit No    Medication changes reported     No    Fall or balance concerns reported    No    Warm-up and Cool-down Performed on first and last piece of equipment    Resistance Training Performed Yes    VAD Patient? No    PAD/SET Patient? No      Pain Assessment   Currently in Pain? No/denies              Social History   Tobacco Use  Smoking Status Never Smoker  Smokeless Tobacco Never Used    Goals Met:  Independence with exercise equipment Exercise tolerated well No report of cardiac concerns or symptoms Strength training completed today  Goals Unmet:  Not Applicable  Comments: Pt able to follow exercise prescription today without complaint.  Will continue to monitor for progression.    Dr. Emily Filbert is Medical Director for [place] and [name].

## 2020-07-14 ENCOUNTER — Ambulatory Visit: Payer: Self-pay | Admitting: Urology

## 2020-07-17 NOTE — Progress Notes (Signed)
Carelink Summary Report / Loop Recorder 

## 2020-07-19 ENCOUNTER — Encounter: Payer: Self-pay | Admitting: *Deleted

## 2020-07-19 DIAGNOSIS — Z9889 Other specified postprocedural states: Secondary | ICD-10-CM

## 2020-07-19 DIAGNOSIS — Z8679 Personal history of other diseases of the circulatory system: Secondary | ICD-10-CM

## 2020-07-19 NOTE — Progress Notes (Signed)
Cardiac Individual Treatment Plan  Patient Details  Name: Jorge [name] MRN: [id number] Date of Birth: 1966-02-24 Referring Provider:   Flowsheet Row Cardiac Rehab from 05/25/2020 in [facility] Cardiac and Pulmonary Rehab  Referring Provider Thompson Grayer MD      Initial Encounter Date:  Flowsheet Row Cardiac Rehab from 05/25/2020 in [facility] Cardiac and Pulmonary Rehab  Date 05/25/20      Visit Diagnosis: Status post Maze operation for atrial fibrillation  Patient's Home Medications on Admission:  Current Outpatient Medications:  .  amiodarone (PACERONE) 200 MG tablet, Take 1 tablet (200 mg total) by mouth daily for 56 days, THEN 0.5 tablets (100 mg total) daily., Disp: 90 tablet, Rfl: 3 .  aspirin 81 MG EC tablet, Take 81 mg by mouth daily. , Disp: , Rfl:  .  aspirin-acetaminophen-caffeine (EXCEDRIN MIGRAINE) 250-250-65 MG tablet, Take 2 tablets by mouth every 6 (six) hours as needed for headache. , Disp: , Rfl:  .  azelastine (ASTELIN) 0.1 % nasal spray, Place 1 spray into both nostrils 2 (two) times daily., Disp: 30 mL, Rfl: 12 .  ferrous [id number] C-folic acid (TRINSICON / FOLTRIN) capsule, Take 1 capsule by mouth 2 (two) times daily after a meal., Disp: 60 capsule, Rfl: 1 .  fluticasone (FLONASE) 50 MCG/ACT nasal spray, Place 2 sprays into both nostrils daily., Disp: 16 g, Rfl: 3 .  ibuprofen (ADVIL) 200 MG tablet, Take 800 mg by mouth every 6 (six) hours as needed for headache or moderate pain., Disp: , Rfl:  .  montelukast (SINGULAIR) 10 MG tablet, Take 1 tablet (10 mg total) by mouth at bedtime., Disp: 90 tablet, Rfl: 3 .  rosuvastatin (CRESTOR) 40 MG tablet, Take 1 tablet (40 mg total) by mouth daily. OFFICE VISIT NEEDED FOR ADDITIONAL REFILLS, Disp: 30 tablet, Rfl: 0 .  tamsulosin (FLOMAX) 0.4 MG CAPS capsule, Take 1 capsule (0.4 mg total) by mouth daily after supper., Disp: 30 capsule, Rfl: 3 .  traMADol (ULTRAM) 50 MG tablet, Take 1-2 tablets (50-100 mg total) by mouth  every 4 (four) hours as needed for moderate pain., Disp: 28 tablet, Rfl: 0 .  XARELTO 20 MG TABS tablet, Take 1 tablet (20 mg total) by mouth daily with supper., Disp: 90 tablet, Rfl: 1  Past Medical History: Past Medical History:  Diagnosis Date  . Allergic rhinitis, seasonal   . Anticoagulant long-term use managed by cardiology   asa and xarelto  . Bilateral nephrolithiasis    urologist--- dr Junious Silk ([place] office)  . Coronary artery disease cardiologist--- dr Angelena Form   nuclear stress test 10-18-2016 w/ normal perfusion;  cardiac cath 12-23-2017, PCI w/ DES to midLAD and mild RCA/ moderate CFx diease nonobstructive , normal LVF;  ETT 07-13-2019 negative for ischemia  . Dysrhythmia    Afib  . History of kidney stones   . Hyperlipidemia   . Hypertension    followed by pcp and cardiology  . OA (osteoarthritis)    knees  . OSA on CPAP    12-31-2019  per pt uses cpap every night  . PAF (paroxysmal atrial fibrillation) ([place]) 2011   followed by dr Rayann Heman (ep)---  first dx 2011;  s/p DCCV's and ablation x2 in 2015 and last one 03/ 2018, and s/p loop recorder 05/ 2021;   failed multaq, flecainide, and amiodarone  . Pneumonia   . Right knee meniscal tear   . S/P ablation of atrial fibrillation followed by dr allred   x3  last one 2018  .  S/P drug eluting coronary stent placement 12/23/2017   x1  to midLAD  . S/P minimally-invasive maze operation for atrial fibrillation 03/30/2020   Complete bilateral atrial lesion set using cryothermy and bipolar radiofrequency ablation with clipping of LA appendage via right mini thoracotomy approach  . SOB (shortness of breath)    12-31-2019  per pt only when he has atrial fib , otherwise no issues with sob with activities  . Status post placement of implantable loop recorder 09/08/2019   followed by dr allred    Tobacco Use: Social History   Tobacco Use  Smoking Status Never Smoker  Smokeless Tobacco Never Used    Labs: Recent Review  Flowsheet Data    Labs for ITP Cardiac and Pulmonary Rehab Latest Ref Rng & Units 03/30/2020 03/30/2020 03/30/2020 03/30/2020 03/30/2020   Cholestrol 100 - 199 mg/dL - - - - -   LDLCALC 0 - 99 mg/dL - - - - -   HDL >39 mg/dL - - - - -   Trlycerides 0 - 149 mg/dL - - - - -   Hemoglobin A1c 4.8 - 5.6 % - - - - -   PHART 7.350 - 7.450 7.276(L) - 7.328(L) 7.267(L) 7.358   PCO2ART 32.0 - 48.0 mmHg 53.0(H) - 45.9 51.5(H) 34.4   HCO3 20.0 - 28.0 mmol/L 24.7 - 24.2 23.6 19.3(L)   TCO2 22 - 32 mmol/L 26 23 26 25  20(L)   ACIDBASEDEF 0.0 - 2.0 mmol/L 2.0 - 2.0 4.0(H) 6.0(H)   O2SAT % 93.0 - 99.0 92.0 90.0       Exercise Target Goals: Exercise Program Goal: Individual exercise prescription set using results from initial 6 min walk test and THRR while considering  patient's activity barriers and safety.   Exercise Prescription Goal: Initial exercise prescription builds to 30-45 minutes a day of aerobic activity, 2-3 days per week.  Home exercise guidelines will be given to patient during program as part of exercise prescription that the participant will acknowledge.   Education: Aerobic Exercise: - Group verbal and visual presentation on the components of exercise prescription. Introduces F.I.T.T principle from ACSM for exercise prescriptions.  Reviews F.I.T.T. principles of aerobic exercise including progression. Written material given at graduation. Flowsheet Row Cardiac Rehab from 07/13/2020 in [facility] Cardiac and Pulmonary Rehab  Date 06/15/20  Educator jh  Instruction Review Code 1- Verbalizes Understanding      Education: Resistance Exercise: - Group verbal and visual presentation on the components of exercise prescription. Introduces F.I.T.T principle from ACSM for exercise prescriptions  Reviews F.I.T.T. principles of resistance exercise including progression. Written material given at graduation. Flowsheet Row Cardiac Rehab from 07/13/2020 in [facility] Cardiac and Pulmonary Rehab  Date 06/22/20   Educator [facility]  Instruction Review Code 1- Verbalizes Understanding       Education: Exercise & Equipment Safety: - Individual verbal instruction and demonstration of equipment use and safety with use of the equipment. Flowsheet Row Cardiac Rehab from 07/13/2020 in [facility] Cardiac and Pulmonary Rehab  Date 05/18/20  Educator [facility]  Instruction Review Code 1- Verbalizes Understanding      Education: Exercise Physiology & General Exercise Guidelines: - Group verbal and written instruction with models to review the exercise physiology of the cardiovascular system and associated critical values. Provides general exercise guidelines with specific guidelines to those with heart or lung disease.    Education: Flexibility, Balance, Mind/Body Relaxation: - Group verbal and visual presentation with interactive activity on the components of exercise prescription. Introduces F.I.T.T principle from  ACSM for exercise prescriptions. Reviews F.I.T.T. principles of flexibility and balance exercise training including progression. Also discusses the mind body connection.  Reviews various relaxation techniques to help reduce and manage stress (i.e. Deep breathing, progressive muscle relaxation, and visualization). Balance handout provided to take home. Written material given at graduation.   Activity Barriers & Risk Stratification:  Activity Barriers & Cardiac Risk Stratification - 05/25/20 0928      Activity Barriers & Cardiac Risk Stratification   Activity Barriers Joint Problems;Shortness of Breath;Muscular Weakness;Other (comment);Incisional Pain    Comments bilateral knee surgery (most recent meniscus tear in 9/20200, incisional soreness, weakness in chest from incision    Cardiac Risk Stratification Moderate           6 Minute Walk:  6 Minute Walk    Row Name 05/25/20 0926         6 Minute Walk   Phase Initial     Distance 1515 feet     Walk Time 6 minutes     # of Rest Breaks 0     MPH 2.87      METS 4.34     RPE 10     Perceived Dyspnea  1.5     VO2 Peak 15.19     Symptoms Yes (comment)     Comments incisional soreness 2/10     Resting HR 93 bpm     Resting BP 122/64     Resting Oxygen Saturation  98 %     Exercise Oxygen Saturation  during 6 min walk 96 %     Max Ex. HR 108 bpm     Max Ex. BP 134/64     2 Minute Post BP 108/60            Oxygen Initial Assessment:   Oxygen Re-Evaluation:   Oxygen Discharge (Final Oxygen Re-Evaluation):   Initial Exercise Prescription:  Initial Exercise Prescription - 05/25/20 0900      Date of Initial Exercise RX and Referring Provider   Date 05/25/20    Referring Provider Thompson Grayer MD      Treadmill   MPH 2.8    Grade 2    Minutes 15    METs 3.92      Recumbant Elliptical   Level 2    RPM 50    Minutes 15    METs 3      Elliptical   Level 1    Speed 4.4    Minutes 15      Prescription Details   Frequency (times per week) 2    Duration Progress to 30 minutes of continuous aerobic without signs/symptoms of physical distress      Intensity   THRR 40-80% of Max Heartrate 122-151    Ratings of Perceived Exertion 11-13    Perceived Dyspnea 0-4      Progression   Progression Continue to progress workloads to maintain intensity without signs/symptoms of physical distress.      Resistance Training   Training Prescription Yes    Weight 6 lb    Reps 10-15           Perform Capillary Blood Glucose checks as needed.  Exercise Prescription Changes:  Exercise Prescription Changes    Row Name 05/25/20 0900 06/05/20 1700 06/21/20 0800 06/22/20 0700 07/04/20 1300     Response to Exercise   Blood Pressure (Admit) 122/64 132/82 126/78 -- 122/64   Blood Pressure (Exercise) 134/64 122/66 136/72 -- 138/74   Blood Pressure (  Exit) 108/60 112/62 116/66 -- 122/62   Heart Rate (Admit) 93 bpm 94 bpm 91 bpm -- 62 bpm   Heart Rate (Exercise) 108 bpm 120 bpm 114 bpm -- 121 bpm   Heart Rate (Exit) 96 bpm 105 bpm  102 bpm -- 101 bpm   Oxygen Saturation (Admit) 97 % -- -- -- --   Oxygen Saturation (Exercise) 96 % -- -- -- --   Rating of Perceived Exertion (Exercise) 10 13 14  -- 15   Perceived Dyspnea (Exercise) 1.5 -- -- -- --   Symptoms incisional soreness 2/10 -- none -- none   Comments walk test results second day -- -- --   Duration -- -- Continue with 30 min of aerobic exercise without signs/symptoms of physical distress. -- Continue with 30 min of aerobic exercise without signs/symptoms of physical distress.   Intensity -- -- THRR unchanged -- THRR unchanged     Progression   Progression -- Continue to progress workloads to maintain intensity without signs/symptoms of physical distress. Continue to progress workloads to maintain intensity without signs/symptoms of physical distress. -- Continue to progress workloads to maintain intensity without signs/symptoms of physical distress.   Average METs -- 4 5.55 -- 5.3     Resistance Training   Training Prescription -- Yes Yes -- Yes   Weight -- 6 lb 10 lb -- 10 lb   Reps -- 10-15 10-15 -- 10-15     Interval Training   Interval Training -- -- Yes -- Yes   Equipment -- -- Treadmill -- Treadmill   Comments -- -- 3%-8% grade 1 min -- 3%-8% grade 1 min     Treadmill   MPH -- 3.5 3.5 -- 3.5   Grade -- 2 8 -- 9.5   Minutes -- 15 15 -- 15   METs -- 4.64 7.5 -- --     Recumbant Elliptical   Level -- -- 3 -- --   Minutes -- -- 15 -- --   METs -- -- 3.6 -- --     Elliptical   Level -- -- 2 -- 5   Speed -- -- 4.4 -- --   Minutes -- -- 15 -- 15     Home Exercise Plan   Plans to continue exercise at -- -- -- Home (comment)  walking, elliptical Home (comment)  walking, elliptical   Frequency -- -- -- Add 3 additional days to program exercise sessions. Add 3 additional days to program exercise sessions.   Initial Home Exercises Provided -- -- -- 06/22/20 06/22/20   Row Name 07/18/20 1400             Response to Exercise   Blood Pressure  (Admit) 140/80       Blood Pressure (Exercise) 150/60       Blood Pressure (Exit) 106/68       Heart Rate (Admit) 64 bpm       Heart Rate (Exercise) 115 bpm       Heart Rate (Exit) 100 bpm       Rating of Perceived Exertion (Exercise) 14       Symptoms none       Duration Continue with 30 min of aerobic exercise without signs/symptoms of physical distress.       Intensity THRR unchanged               Progression   Progression Continue to progress workloads to maintain intensity without signs/symptoms of physical distress.  Average METs 7.23               Resistance Training   Training Prescription Yes       Weight 12 lb       Reps 10-15               Interval Training   Interval Training Yes       Equipment Treadmill;Elliptical;REL-XR       Comments going up to 9% grade, interval program on elliptical               Treadmill   MPH 4       Grade 9       Minutes 15       METs 9.8               Elliptical   Level 6       Speed 4.4       Minutes 15       METs 4.5               REL-XR   Level 8       Minutes 15       METs 7.4               Home Exercise Plan   Plans to continue exercise at Home (comment)  walking, elliptical       Frequency Add 3 additional days to program exercise sessions.       Initial Home Exercises Provided 06/22/20              Exercise Comments:  Exercise Comments    Row Name 05/30/20 0807           Exercise Comments First full day of exercise!  Patient was oriented to gym and equipment including functions, settings, policies, and procedures.  Patient's individual exercise prescription and treatment plan were reviewed.  All starting workloads were established based on the results of the 6 minute walk test done at initial orientation visit.  The plan for exercise progression was also introduced and progression will be customized based on patient's performance and goals.              Exercise Goals and Review:  Exercise  Goals    Row Name 05/25/20 0930             Exercise Goals   Increase Physical Activity Yes       Intervention Provide advice, education, support and counseling about physical activity/exercise needs.;Develop an individualized exercise prescription for aerobic and resistive training based on initial evaluation findings, risk stratification, comorbidities and participant's personal goals.       Expected Outcomes Short Term: Attend rehab on a regular basis to increase amount of physical activity.;Long Term: Add in home exercise to make exercise part of routine and to increase amount of physical activity.;Long Term: Exercising regularly at least 3-5 days a week.       Increase Strength and Stamina Yes       Intervention Provide advice, education, support and counseling about physical activity/exercise needs.;Develop an individualized exercise prescription for aerobic and resistive training based on initial evaluation findings, risk stratification, comorbidities and participant's personal goals.       Expected Outcomes Short Term: Increase workloads from initial exercise prescription for resistance, speed, and METs.;Long Term: Improve cardiorespiratory fitness, muscular endurance and strength as measured by increased METs and functional capacity (6MWT);Short Term: Perform resistance training exercises routinely during rehab and  add in resistance training at home       Able to understand and use rate of perceived exertion (RPE) scale Yes       Intervention Provide education and explanation on how to use RPE scale       Expected Outcomes Short Term: Able to use RPE daily in rehab to express subjective intensity level;Long Term:  Able to use RPE to guide intensity level when exercising independently       Able to understand and use Dyspnea scale Yes       Intervention Provide education and explanation on how to use Dyspnea scale       Expected Outcomes Short Term: Able to use Dyspnea scale daily in rehab  to express subjective sense of shortness of breath during exertion;Long Term: Able to use Dyspnea scale to guide intensity level when exercising independently       Knowledge and understanding of Target Heart Rate Range (THRR) Yes       Intervention Provide education and explanation of THRR including how the numbers were predicted and where they are located for reference       Expected Outcomes Short Term: Able to state/look up THRR;Short Term: Able to use daily as guideline for intensity in rehab;Long Term: Able to use THRR to govern intensity when exercising independently       Able to check pulse independently Yes       Intervention Provide education and demonstration on how to check pulse in carotid and radial arteries.;Review the importance of being able to check your own pulse for safety during independent exercise       Expected Outcomes Short Term: Able to explain why pulse checking is important during independent exercise;Long Term: Able to check pulse independently and accurately       Understanding of Exercise Prescription Yes       Intervention Provide education, explanation, and written materials on patient's individual exercise prescription       Expected Outcomes Short Term: Able to explain program exercise prescription;Long Term: Able to explain home exercise prescription to exercise independently              Exercise Goals Re-Evaluation :  Exercise Goals Re-Evaluation    Row Name 05/30/20 [phone] 06/05/20 1710 06/21/20 0841 06/22/20 0737 06/29/20 0722     Exercise Goal Re-Evaluation   Exercise Goals Review Understanding of Exercise Prescription;Knowledge and understanding of Target Heart Rate Range (THRR);Able to understand and use rate of perceived exertion (RPE) scale;Able to understand and use Dyspnea scale -- Increase Physical Activity;Increase Strength and Stamina;Understanding of Exercise Prescription Increase Physical Activity;Increase Strength and Stamina;Understanding of  Exercise Prescription;Able to understand and use rate of perceived exertion (RPE) scale;Able to understand and use Dyspnea scale;Knowledge and understanding of Target Heart Rate Range (THRR);Able to check pulse independently Increase Physical Activity;Increase Strength and Stamina;Understanding of Exercise Prescription   Comments Reviewed RPE and dyspnea scales, THR and program prescription with pt today.  Pt voiced understanding and was given a copy of goals to take home. Jorge [name] has tolerated exercise well in his first 2 sessions.  We will monitor progress. Jorge [name] is doing well in rehab.  He is now doing intervals on the treadmill using the incline up to 8%.  We will continue to monitor his progress. Reviewed home exercise with pt today.  Pt plans to continue walking and using his elliptical at home for exercise.  Reviewed THR, pulse, RPE, sign and symptoms, pulse oximetery and when to call  911 or MD.  Also discussed weather considerations and indoor options.  Pt voiced understanding. Jorge [name] is doing well in rehab.  He is exercising on his off days by walking and using his elliptical.  He was able to help someone move the other day.  He has plans to go play golf today.   Expected Outcomes Short: Use RPE daily to regulate intensity. Long: Follow program prescription in THR. Short: attend consistently Long: improve MET level Short: Continue to add in intervals and work up to them on elliptical  Long: Continue to improve stamina. Short: Continue to walk daily but focus on heart rate  Long: Continue to improve stamina. Short: Continue to get in exercise each day Long: Continue to improve stamina.   [place] Name 07/04/20 1309 07/18/20 1446           Exercise Goal Re-Evaluation   Exercise Goals Review Increase Physical Activity;Increase Strength and Stamina Increase Physical Activity;Increase Strength and Stamina;Understanding of Exercise Prescription      Comments Jorge [name] is progressing well.  he goes up to 9.5%  incline on the TM and level 5 on elliptical.  We will continue to monitor progress. Jorge [name] is about half way through the program and doing well.  He continues to do his intervals each day.  He is now up to 12 lb weights.  We will continue to monitor his progress.      Expected Outcomes Short: continue consistent exercise Long:  increase MET level Short: Continue with intervals Long: Continue to improve stamina.             Discharge Exercise Prescription (Final Exercise Prescription Changes):  Exercise Prescription Changes - 07/18/20 1400      Response to Exercise   Blood Pressure (Admit) 140/80    Blood Pressure (Exercise) 150/60    Blood Pressure (Exit) 106/68    Heart Rate (Admit) 64 bpm    Heart Rate (Exercise) 115 bpm    Heart Rate (Exit) 100 bpm    Rating of Perceived Exertion (Exercise) 14    Symptoms none    Duration Continue with 30 min of aerobic exercise without signs/symptoms of physical distress.    Intensity THRR unchanged      Progression   Progression Continue to progress workloads to maintain intensity without signs/symptoms of physical distress.    Average METs 7.23      Resistance Training   Training Prescription Yes    Weight 12 lb    Reps 10-15      Interval Training   Interval Training Yes    Equipment Treadmill;Elliptical;REL-XR    Comments going up to 9% grade, interval program on elliptical      Treadmill   MPH 4    Grade 9    Minutes 15    METs 9.8      Elliptical   Level 6    Speed 4.4    Minutes 15    METs 4.5      REL-XR   Level 8    Minutes 15    METs 7.4      Home Exercise Plan   Plans to continue exercise at Home (comment)   walking, elliptical   Frequency Add 3 additional days to program exercise sessions.    Initial Home Exercises Provided 06/22/20           Nutrition:  Target Goals: Understanding of nutrition guidelines, daily intake of sodium <1558m, cholesterol <2084m calories 30% from fat and 7% or less  from  saturated fats, daily to have 5 or more servings of fruits and vegetables.  Education: All About Nutrition: -Group instruction provided by verbal, written material, interactive activities, discussions, models, and posters to present general guidelines for heart healthy nutrition including fat, fiber, MyPlate, the role of sodium in heart healthy nutrition, utilization of the nutrition label, and utilization of this knowledge for meal planning. Follow up email sent as well. Written material given at graduation. Flowsheet Row Cardiac Rehab from 07/13/2020 in [facility] Cardiac and Pulmonary Rehab  Education need identified 05/24/20  Date 07/06/20  Educator [place]  Instruction Review Code 1- Verbalizes Understanding      Biometrics:  Pre Biometrics - 05/25/20 0931      Pre Biometrics   Height 5' 10.5" (1.791 m)    Weight 190 lb 3.2 oz (86.3 kg)    BMI (Calculated) 26.9    Single Leg Stand 28.8 seconds            Nutrition Therapy Plan and Nutrition Goals:  Nutrition Therapy & Goals - 05/25/20 0925      Nutrition Therapy   Diet Heart healthy, low Na    Drug/Food Interactions Statins/Certain Fruits    Protein (specify units) 70g    Fiber 30 grams    Whole Grain Foods 3 servings    Saturated Fats 12 max. grams    Fruits and Vegetables 8 servings/day    Sodium 1.5 grams      Personal Nutrition Goals   Nutrition Goal ST: check almond milk for fortification, reduce sodium using some of the techniques discussed (citrus, vinegar, etc), increase variety, try "nice" cream LT: fiber 30g/day, 8 fruits and vegetables per day    Comments He reports limiting red meat, eating pork chops 1x/month, chicken and fish mostly. He reports eating whole grains and having a garden of his own vegetables he eats over the summer. he drinks almond milk or 2% milk. He likes oikos yogurt. For breakfast he will have oatmeal or grits with fruit and ground flax; on the weekned he will have bacon and eggs. L: sandwich ([place]  or peanut butter and jelly) D: chicken, fish more during the summer. During the winter they eat more crockpot meals like chili. Vegetables: leafy greens, string beans, butter beans, peas. Fruit: bananas, apples, clementines, fruit medley. S: ice cream or some fruit. Discussed heart healhty eating. Recommended trying "nice" cream (made with frozen bananas).      Intervention Plan   Intervention Prescribe, educate and counsel regarding individualized specific dietary modifications aiming towards targeted core components such as weight, hypertension, lipid management, diabetes, heart failure and other comorbidities.;Nutrition handout(s) given to patient.    Expected Outcomes Short Term Goal: Understand basic principles of dietary content, such as calories, fat, sodium, cholesterol and nutrients.;Short Term Goal: A plan has been developed with personal nutrition goals set during dietitian appointment.;Long Term Goal: Adherence to prescribed nutrition plan.           Nutrition Assessments:  MEDIFICTS Score Key:  ?70 Need to make dietary changes   40-70 Heart Healthy Diet  ? 40 Therapeutic Level Cholesterol Diet  Flowsheet Row Cardiac Rehab from 05/25/2020 in [facility] Cardiac and Pulmonary Rehab  Picture Your Plate Total Score on Admission 83     Picture Your Plate Scores:  <98 Unhealthy dietary pattern with much room for improvement.  41-50 Dietary pattern unlikely to meet recommendations for good health and room for improvement.  51-60 More healthful dietary pattern, with some room for  improvement.   >60 Healthy dietary pattern, although there may be some specific behaviors that could be improved.    Nutrition Goals Re-Evaluation:  Nutrition Goals Re-Evaluation    Jorge [name] Name 06/29/20 0725             Goals   Nutrition Goal ST: check almond milk for fortification, reduce sodium using some of the techniques discussed (citrus, vinegar, etc), increase variety, try "nice" cream LT: fiber  30g/day, 8 fruits and vegetables per day       Comment Jorge [name] is doing well with his diet.  He eats lots of fish and chicken.  He gets lots of vegetables.  He has not had a steak in over a month.  He is using almond milk some now with his cereal.  He is trying to watch his sodium more.       Expected Outcome Short: Continue to watch sodium Long; Continue to eat healthier              Nutrition Goals Discharge (Final Nutrition Goals Re-Evaluation):  Nutrition Goals Re-Evaluation - 06/29/20 0725      Goals   Nutrition Goal ST: check almond milk for fortification, reduce sodium using some of the techniques discussed (citrus, vinegar, etc), increase variety, try "nice" cream LT: fiber 30g/day, 8 fruits and vegetables per day    Comment Jorge [name] is doing well with his diet.  He eats lots of fish and chicken.  He gets lots of vegetables.  He has not had a steak in over a month.  He is using almond milk some now with his cereal.  He is trying to watch his sodium more.    Expected Outcome Short: Continue to watch sodium Long; Continue to eat healthier           Psychosocial: Target Goals: Acknowledge presence or absence of significant depression and/or stress, maximize coping skills, provide positive support system. Participant is able to verbalize types and ability to use techniques and skills needed for reducing stress and depression.   Education: Stress, Anxiety, and Depression - Group verbal and visual presentation to define topics covered.  Reviews how body is impacted by stress, anxiety, and depression.  Also discusses healthy ways to reduce stress and to treat/manage anxiety and depression.  Written material given at graduation. Flowsheet Row Cardiac Rehab from 07/13/2020 in [facility] Cardiac and Pulmonary Rehab  Education need identified 05/24/20      Education: Sleep Hygiene -Provides group verbal and written instruction about how sleep can affect your health.  Define sleep hygiene, discuss  sleep cycles and impact of sleep habits. Review good sleep hygiene tips.    Initial Review & Psychosocial Screening:  Initial Psych Review & Screening - 05/18/20 1013      Initial Review   Current issues with None Identified      Family Dynamics   Good Support System? Yes    Comments He can look to his son and his wife for support. He has a good outlook on his health after his procedure and is ready to start exercising.      Barriers   Psychosocial barriers to participate in program There are no identifiable barriers or psychosocial needs.      Screening Interventions   Interventions To provide support and resources with identified psychosocial needs;Provide feedback about the scores to participant    Expected Outcomes Short Term goal: Utilizing psychosocial counselor, staff and physician to assist with identification of specific Stressors or current issues interfering  with healing process. Setting desired goal for each stressor or current issue identified.;Long Term Goal: Stressors or current issues are controlled or eliminated.;Short Term goal: Identification and review with participant of any Quality of Life or Depression concerns found by scoring the questionnaire.;Long Term goal: The participant improves quality of Life and PHQ9 Scores as seen by post scores and/or verbalization of changes           Quality of Life Scores:   Quality of Life - 05/24/20 1313      Quality of Life   Select Quality of Life      Quality of Life Scores   Health/Function Pre 25.07 %    Socioeconomic Pre 26.25 %    Psych/Spiritual Pre 28.07 %    Family Pre 28.8 %    GLOBAL Pre 26.47 %          Scores of 19 and below usually indicate a poorer quality of life in these areas.  A difference of  2-3 points is a clinically meaningful difference.  A difference of 2-3 points in the total score of the Quality of Life Index has been associated with significant improvement in overall quality of life,  self-image, physical symptoms, and general health in studies assessing change in quality of life.  PHQ-9: Recent Review Flowsheet Data    Depression screen [facility] 2/9 05/25/2020 04/06/2019 07/30/2018 06/29/2018 03/31/2018   Decreased Interest 0 0 0 0 0   Down, Depressed, Hopeless 0 0 0 0 0   PHQ - 2 Score 0 0 0 0 0   Altered sleeping 1 0 - - -   Tired, decreased energy 0 0 - - -   Change in appetite 0 0 - - -   Feeling bad or failure about yourself  0 0 - - -   Trouble concentrating 0 0 - - -   Moving slowly or fidgety/restless 0 0 - - -   Suicidal thoughts 0 0 - - -   PHQ-9 Score 1 0 - - -   Difficult doing work/chores Not difficult at all Not difficult at all - - -     Interpretation of Total Score  Total Score Depression Severity:  1-4 = Minimal depression, 5-9 = Mild depression, 10-14 = Moderate depression, 15-19 = Moderately severe depression, 20-27 = Severe depression   Psychosocial Evaluation and Intervention:  Psychosocial Evaluation - 05/18/20 1014      Psychosocial Evaluation & Interventions   Interventions Encouraged to exercise with the program and follow exercise prescription;Relaxation education;Stress management education    Comments He can look to his son and his wife for support. He has a good outlook on his health after his procedure and is ready to start exercising.    Expected Outcomes Short: Exercise regularly to support mental health and notify staff of any changes. Long: maintain mental health and well being through teaching of rehab or prescribed medications independently.    Continue Psychosocial Services  Follow up required by staff           Psychosocial Re-Evaluation:  Psychosocial Re-Evaluation    [place] Name 06/29/20 [phone]             Psychosocial Re-Evaluation   Current issues with None Identified       Comments Jorge [name] is doing well in rehab. He feels good mentally and declines any major stressors.  He sleeps well most days.  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       Expected Outcomes Short: Continue to make time for exercise Long: Continue to stay positive.       Interventions Encouraged to attend Cardiac Rehabilitation for the exercise       Continue Psychosocial Services  Follow up required by staff              Psychosocial Discharge (Final Psychosocial Re-Evaluation):  Psychosocial Re-Evaluation - 06/29/20 0723      Psychosocial Re-Evaluation   Current issues with None Identified    Comments Jorge [name] is doing well in rehab. He feels good mentally and declines any major stressors.  He sleeps well most days.  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    Expected Outcomes Short: Continue to make time for exercise Long: Continue to stay positive.    Interventions Encouraged to attend Cardiac Rehabilitation for the exercise    Continue Psychosocial Services  Follow up required by staff           Vocational Rehabilitation: Provide vocational rehab assistance to qualifying candidates.   Vocational Rehab Evaluation & Intervention:   Education: Education Goals: Education classes will be provided on a variety of topics geared toward better understanding of heart health and risk factor modification. Participant will state understanding/return demonstration of topics presented as noted by education test scores.  Learning Barriers/Preferences:  Learning Barriers/Preferences - 05/18/20 1009      Learning Barriers/Preferences   Learning Barriers None    Learning Preferences None           General Cardiac Education Topics:  AED/CPR: - Group verbal and written instruction with the use of models to demonstrate the basic use of the AED with the basic ABC's of resuscitation.   Anatomy and Cardiac Procedures: - Group verbal and visual presentation and models provide information about basic  cardiac anatomy and function. Reviews the testing methods done to diagnose heart disease and the outcomes of the test results. Describes the treatment choices: Medical Management, Angioplasty, or Coronary Bypass Surgery for treating various heart conditions including Myocardial Infarction, Angina, Valve Disease, and Cardiac Arrhythmias.  Written material given at graduation. Flowsheet Row Cardiac Rehab from 07/13/2020 in [facility] Cardiac and Pulmonary Rehab  Date 06/22/20  Educator SB  Instruction Review Code 1- Verbalizes Understanding      Medication Safety: - Group verbal and visual instruction to review commonly prescribed medications for heart and lung disease. Reviews the medication, class of the drug, and side effects. Includes the steps to properly store meds and maintain the prescription regimen.  Written material given at graduation. Flowsheet Row Cardiac Rehab from 07/13/2020 in [facility] Cardiac and Pulmonary Rehab  Date 07/13/20  Educator SB  Instruction Review Code 1- Verbalizes Understanding      Intimacy: - Group verbal instruction through game format to discuss how heart and lung disease can affect sexual intimacy. Written material given at graduation.. Flowsheet Row Cardiac Rehab from 07/13/2020 in [facility] Cardiac and Pulmonary Rehab  Date 06/15/20  Educator jh  Instruction Review Code 1- Verbalizes Understanding      Know Your Numbers and Heart Failure: - Group verbal and visual instruction to discuss disease risk factors for cardiac and pulmonary disease and treatment options.  Reviews associated critical values for Overweight/Obesity, Hypertension, Cholesterol, and Diabetes.  Discusses basics of heart failure: signs/symptoms and treatments.  Introduces Heart Failure Zone chart for action plan for heart failure.  Written material given at graduation.   Infection Prevention: - Provides verbal and written material to individual with discussion of infection control including proper  hand washing and proper equipment cleaning during exercise session. Flowsheet Row Cardiac Rehab from 07/13/2020 in [facility] Cardiac and Pulmonary Rehab  Date 05/18/20  Educator [facility]  Instruction Review Code 1- Verbalizes Understanding      Falls Prevention: - Provides verbal and written material to individual with discussion of falls prevention and safety. Flowsheet Row Cardiac Rehab from 07/13/2020 in [facility] Cardiac and Pulmonary Rehab  Date 05/18/20  Educator [facility]  Instruction Review Code 1- Verbalizes Understanding      Other: -Provides group and verbal instruction on various topics (see comments)   Knowledge Questionnaire Score:  Knowledge Questionnaire Score - 05/24/20 1313      Knowledge Questionnaire Score   Pre Score 23/26 Education Focus: Depression, nutrition           Core Components/Risk Factors/Patient Goals at Admission:  Personal Goals and Risk Factors at Admission - 05/25/20 0932      Core Components/Risk Factors/Patient Goals on Admission    Weight Management Yes;Weight Loss    Intervention Weight Management: Develop a combined nutrition and exercise program designed to reach desired caloric intake, while maintaining appropriate intake of nutrient and fiber, sodium and fats, and appropriate energy expenditure required for the weight goal.;Weight Management: Provide education and appropriate resources to help participant work on and attain dietary goals.;Weight Management/Obesity: Establish reasonable short term and long term weight goals.    Admit Weight 190 lb 3.2 oz (86.3 kg)    Goal Weight: Short Term 185 lb (83.9 kg)    Goal Weight: Long Term 185 lb (83.9 kg)    Expected Outcomes Short Term: Continue to assess and modify interventions until short term weight is achieved;Long Term: Adherence to nutrition and physical activity/exercise program aimed toward attainment of established weight goal;Understanding recommendations for meals to include 15-35% energy as protein,  25-35% energy from fat, 35-60% energy from carbohydrates, less than 279m of dietary cholesterol, 20-35 gm of total fiber daily;Understanding of distribution of calorie intake throughout the day with the consumption of 4-5 meals/snacks;Weight Loss: Understanding of general recommendations for a balanced deficit meal plan, which promotes 1-2 lb weight loss per week and includes a negative energy balance of [phone] kcal/d;Weight Maintenance: Understanding of the daily nutrition guidelines, which includes 25-35% calories from fat, 7% or less cal from saturated fats, less than 2067mcholesterol, less than 1.5gm of sodium, & 5 or more servings of fruits and vegetables daily    Hypertension Yes    Intervention Provide education on lifestyle modifcations including regular physical activity/exercise, weight management, moderate sodium restriction and increased consumption of fresh fruit, vegetables, and low fat dairy, alcohol moderation, and smoking cessation.;Monitor prescription use compliance.    Expected Outcomes Short Term: Continued assessment and intervention until BP is < 140/9022mG in hypertensive participants. < 130/65m48m in hypertensive participants with diabetes, heart failure or chronic kidney disease.;Long Term: Maintenance of blood pressure at goal levels.    Lipids Yes    Intervention Provide education and support for participant on nutrition & aerobic/resistive exercise along with prescribed medications to achieve LDL <70mg29mL >40mg.57mExpected Outcomes Short Term: Participant states understanding of desired cholesterol values and is compliant with medications prescribed. Participant is following exercise prescription and nutrition guidelines.;Long Term: Cholesterol controlled with medications as prescribed, with  individualized exercise RX and with personalized nutrition plan. Value goals: LDL < 78m, HDL > 40 mg.           Education:Diabetes - Individual verbal and written instruction  to review signs/symptoms of diabetes, desired ranges of glucose level fasting, after meals and with exercise. Acknowledge that pre and post exercise glucose checks will be done for 3 sessions at entry of program.   Core Components/Risk Factors/Patient Goals Review:   Goals and Risk Factor Review    Row Name 06/29/20 0726             Core Components/Risk Factors/Patient Goals Review   Personal Goals Review Weight Management/Obesity;Hypertension       Review Jorge [name] doing well in rehab.  His weight was 188 lb today.  He tries to keep it around 185 lb at home.  Blood pressures have been good in class and he keeps an eye on them at home.  He is pleased with where things are currently.       Expected Outcomes Short: Continue to maintain weight Long: Continue to monitor risk factors              Core Components/Risk Factors/Patient Goals at Discharge (Final Review):   Goals and Risk Factor Review - 06/29/20 0726      Core Components/Risk Factors/Patient Goals Review   Personal Goals Review Weight Management/Obesity;Hypertension    Review Jorge [name] doing well in rehab.  His weight was 188 lb today.  He tries to keep it around 185 lb at home.  Blood pressures have been good in class and he keeps an eye on them at home.  He is pleased with where things are currently.    Expected Outcomes Short: Continue to maintain weight Long: Continue to monitor risk factors           ITP Comments:  ITP Comments    Row Name 05/18/20 1009 05/25/20 0926 05/30/20 0807 06/21/20 0610 07/19/20 0713   ITP Comments Virtual Visit completed. Patient informed on EP and RD appointment and 6 Minute walk test. Patient also informed of patient health questionnaires on My Chart. Patient Verbalizes understanding. [facility] Completed 6MWT and gym orientation. Initial ITP created and sent for review to Dr. MEmily Filbert Medical Director. First full day of exercise!  Patient was oriented to  gym and equipment including functions, settings, policies, and procedures.  Patient's individual exercise prescription and treatment plan were reviewed.  All starting workloads were established based on the results of the 6 minute walk test done at initial orientation visit.  The plan for exercise progression was also introduced and progression will be customized based on patient's performance and goals. 30 Day review completed. Medical Director ITP review done, changes made as directed, and signed approval by Medical Director. 30 Day review completed. Medical Director ITP review done, changes made as directed, and signed approval by Medical Director.          Comments:

## 2020-07-20 ENCOUNTER — Other Ambulatory Visit: Payer: Self-pay

## 2020-07-20 ENCOUNTER — Other Ambulatory Visit: Payer: Self-pay | Admitting: Cardiovascular Disease

## 2020-07-20 DIAGNOSIS — Z8679 Personal history of other diseases of the circulatory system: Secondary | ICD-10-CM

## 2020-07-20 DIAGNOSIS — Z9889 Other specified postprocedural states: Secondary | ICD-10-CM | POA: Diagnosis not present

## 2020-07-20 NOTE — Telephone Encounter (Signed)
Xarelto 20mg  refill request received. Pt is 55 years old, weight-84.8kg, Crea-0.65 on 04/04/2020, last seen by Dr. Rayann Heman on 05/01/2020, Diagnosis-Afib, CrCl-155.68ml/min; Dose is appropriate based on dosing criteria. Will send in refill to requested pharmacy.

## 2020-07-20 NOTE — Progress Notes (Signed)
Daily Session Note  Patient Details  Name: Jorge [name] MRN: [id number] Date of Birth: Nov 22, 1965 Referring Provider:   Flowsheet Row Cardiac Rehab from 05/25/2020 in [facility] Cardiac and Pulmonary Rehab  Referring Provider Thompson Grayer MD      Encounter Date: 07/20/2020  Check In:  Session Check In - 07/20/20 0732      Check-In   Supervising physician immediately available to respond to emergencies See telemetry face sheet for immediately available ER MD    Location [facility]-Cardiac & Pulmonary Rehab    Staff Present Birdie Sons, MPA, Mauricia Area, BS, ACSM CEP, Exercise Physiologist;Amanda Oletta Darter, BA, ACSM CEP, Exercise Physiologist    Virtual Visit No    Medication changes reported     No    Fall or balance concerns reported    No    Warm-up and Cool-down Performed on first and last piece of equipment    Resistance Training Performed Yes    VAD Patient? No    PAD/SET Patient? No      Pain Assessment   Currently in Pain? No/denies              Social History   Tobacco Use  Smoking Status Never Smoker  Smokeless Tobacco Never Used    Goals Met:  Independence with exercise equipment Exercise tolerated well No report of cardiac concerns or symptoms Strength training completed today  Goals Unmet:  Not Applicable  Comments: Pt able to follow exercise prescription today without complaint.  Will continue to monitor for progression.    Dr. Emily Filbert is Medical Director for [place] and [name].

## 2020-07-24 NOTE — Progress Notes (Signed)
I,April Miller,acting as a scribe for Wilhemena Durie, MD.,have documented all relevant documentation on the behalf of Wilhemena Durie, MD,as directed by  Wilhemena Durie, MD while in the presence of Wilhemena Durie, MD.   Complete physical exam   Patient: Jorge [name]   DOB: [birth year]   55 y.o. Male  MRN: [id number] Visit Date: 07/25/2020  Today's healthcare provider: Wilhemena Durie, MD   Chief Complaint  Patient presents with  . Annual Exam   Subjective    Jorge [name] is a 55 y.o. male who presents today for a complete physical exam.  He reports consuming a general diet. Home exercise routine includes gym and exercise at home. He generally feels well. He reports sleeping well. He does not have additional problems to discuss today.  HPI  He has done well since his cardiac procedure this winter.  Past Medical History:  Diagnosis Date  . Allergic rhinitis, seasonal   . Anticoagulant long-term use managed by cardiology   asa and xarelto  . Bilateral nephrolithiasis    urologist--- dr Junious Silk ([place] office)  . Coronary artery disease cardiologist--- dr Angelena Form   nuclear stress test 10-18-2016 w/ normal perfusion;  cardiac cath 12-23-2017, PCI w/ DES to midLAD and mild RCA/ moderate CFx diease nonobstructive , normal LVF;  ETT 07-13-2019 negative for ischemia  . Dysrhythmia    Afib  . History of kidney stones   . Hyperlipidemia   . Hypertension    followed by pcp and cardiology  . OA (osteoarthritis)    knees  . OSA on CPAP    12-31-2019  per pt uses cpap every night  . PAF (paroxysmal atrial fibrillation) ([place]) 2011   followed by dr Rayann Heman (ep)---  first dx 2011;  s/p DCCV's and ablation x2 in 2015 and last one 03/ 2018, and s/p loop recorder 05/ 2021;   failed multaq, flecainide, and amiodarone  . Pneumonia   . Right knee meniscal tear   . S/P ablation of atrial fibrillation followed by dr allred   x3  last one 2018  . S/P drug  eluting coronary stent placement 12/23/2017   x1  to midLAD  . S/P minimally-invasive maze operation for atrial fibrillation 03/30/2020   Complete bilateral atrial lesion set using cryothermy and bipolar radiofrequency ablation with clipping of LA appendage via right mini thoracotomy approach  . SOB (shortness of breath)    12-31-2019  per pt only when he has atrial fib , otherwise no issues with sob with activities  . Status post placement of implantable loop recorder 09/08/2019   followed by dr Rayann Heman   Past Surgical History:  Procedure Laterality Date  . APPENDECTOMY  1975  . ATRIAL FIBRILLATION ABLATION N/A 11/02/2013   PVI by Dr Rayann Heman  . ATRIAL FIBRILLATION ABLATION N/A 03/29/2014   PVI and CTI by Dr Rayann Heman  . ATRIAL FIBRILLATION ABLATION N/A 07/04/2016   Procedure: Atrial Fibrillation Ablation;  Surgeon: Thompson Grayer, MD;  Location: [place] [facility];  Service: Cardiovascular;  Laterality: N/A;  . CARDIOVERSION N/A 10/15/2013   Procedure: CARDIOVERSION;  Surgeon: Fay Records, MD;  Location: [facility];  Service: Cardiovascular;  Laterality: N/A;  . CLIPPING OF ATRIAL APPENDAGE  03/30/2020   Procedure: CLIPPING OF ATRIAL APPENDAGE USING ATRICURE ATRICLIP [id number];  Surgeon: Rexene Alberts, MD;  Location: [place];  Service: Open Heart Surgery;;  . COLONOSCOPY  2016  . CORONARY STENT INTERVENTION N/A 12/23/2017  Procedure: CORONARY STENT INTERVENTION;  Surgeon: Burnell Blanks, MD;  Location: [place] [facility];  Service: Cardiovascular;  Laterality: N/A;  . EXTRACORPOREAL SHOCK WAVE LITHOTRIPSY  2005;  2013  . implantable loop recorder placement  09/08/2019   (done in office)   Medtronic Reveal Linq model LNQ 22 (SN [id number]) implantable loop recorder implanted in office by Dr Rayann Heman for afib management  . INGUINAL HERNIA REPAIR Left 05/31/2015   Procedure: HERNIA REPAIR INGUINAL ADULT;  Surgeon: Robert Bellow, MD;  Location: [facility];  Service: General;  Laterality: Left;  .  KNEE ARTHROSCOPY Left 2009   @[facility]   . KNEE ARTHROSCOPY WITH MEDIAL MENISECTOMY Right 01/04/2020   Procedure: KNEE ARTHROSCOPY WITH PARTIAL MEDIAL MENISECTOMY;  Surgeon: Nicholes Stairs, MD;  Location: [facility];  Service: Orthopedics;  Laterality: Right;  . MINIMALLY INVASIVE MAZE PROCEDURE N/A 03/30/2020   Procedure: MINIMALLY INVASIVE MAZE PROCEDURE;  Surgeon: Rexene Alberts, MD;  Location: [place];  Service: Open Heart Surgery;  Laterality: N/A;  . RHINOPLASTY  2006    @[facility]   . RIGHT/LEFT HEART CATH AND CORONARY ANGIOGRAPHY N/A 12/23/2017   Procedure: RIGHT/LEFT HEART CATH AND CORONARY ANGIOGRAPHY;  Surgeon: Burnell Blanks, MD;  Location: [place] [facility];  Service: Cardiovascular;  Laterality: N/A;  . RIGHT/LEFT HEART CATH AND CORONARY ANGIOGRAPHY N/A 02/02/2020   Procedure: RIGHT/LEFT HEART CATH AND CORONARY ANGIOGRAPHY;  Surgeon: Burnell Blanks, MD;  Location: [place] [facility];  Service: Cardiovascular;  Laterality: N/A;  . ROBOT ASSISTED INGUINAL HERNIA REPAIR Left 05-01-2013   @[facility]   . TEE WITHOUT CARDIOVERSION N/A 11/01/2013   Procedure: TRANSESOPHAGEAL ECHOCARDIOGRAM (TEE);  Surgeon: Candee Furbish, MD;  Location: [facility] ENDOSCOPY;  Service: Cardiovascular;  Laterality: N/A;  . TEE WITHOUT CARDIOVERSION N/A 03/28/2014   Procedure: TRANSESOPHAGEAL ECHOCARDIOGRAM (TEE);  Surgeon: Larey Dresser, MD;  Location: [place];  Service: Cardiovascular;  Laterality: N/A;  . TEE WITHOUT CARDIOVERSION N/A 02/02/2020   Procedure: TRANSESOPHAGEAL ECHOCARDIOGRAM (TEE);  Surgeon: Werner Lean, MD;  Location: [facility] ENDOSCOPY;  Service: Cardiovascular;  Laterality: N/A;  . TEE WITHOUT CARDIOVERSION N/A 03/30/2020   Procedure: TRANSESOPHAGEAL ECHOCARDIOGRAM (TEE);  Surgeon: Rexene Alberts, MD;  Location: [place];  Service: Open Heart Surgery;  Laterality: N/A;   Social History   Socioeconomic History  . Marital status: Married    Spouse name: Not on file  .  Number of children: Not on file  . Years of education: Not on file  . Highest education level: Not on file  Occupational History  . Occupation: Passenger transport manager w/ [employer]  Tobacco Use  . Smoking status: Never Smoker  . Smokeless tobacco: Never Used  Vaping Use  . Vaping Use: Never used  Substance and Sexual Activity  . Alcohol use: Yes    Alcohol/week: 2.0 - 3.0 standard drinks    Types: 2 - 3 Cans of beer per week    Comment: occasionally  . Drug use: Never  . Sexual activity: Yes  Other Topics Concern  . Not on file  Social History Narrative   Lives in [place] with his spouse.  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[employer]   Social Determinants of Radio broadcast assistant Strain: Not on [employer] Insecurity: Not on file  Transportation Needs: Not on file  Physical Activity: Not on file  Stress: Not on file  Social Connections: Not on file  Intimate Partner Violence: Not on file   Family Status  Relation Name Status  . Mother  Deceased at age 14  . MGF  Deceased at age 20       from MI  . Brother  Alive       stable health, mental health issues  . PGM  Deceased       From CVA  . Father  Deceased at age 49       suicide  . MGM  Deceased  . Other  (Not Specified)  . PGF  Deceased  . Neg Hx  (Not Specified)   Family History  Problem Relation Age of Onset  . Cancer Mother        breast, cause of death  . Heart attack Maternal Grandfather 53       Cause of death  . Hypertension Maternal Grandfather   . Heart failure Brother   . CVA Paternal Grandmother        cause of death  . Atrial fibrillation Maternal Grandmother   . Colon polyps Other        Fam Hx  . Kidney cancer Neg Hx   . Bladder Cancer Neg Hx   . Prostate cancer Neg Hx    Allergies  Allergen Reactions  . Hydrocodone Nausea And Vomiting  . Oxycodone Nausea And Vomiting    Patient Care Team: Jerrol Banana., MD as PCP - General (Family Medicine) Thompson Grayer, MD as  PCP - Electrophysiology (Cardiology) Burnell Blanks, MD as PCP - Cardiology (Cardiology) Jerrol Banana., MD (Family Medicine) Bary Castilla Forest Gleason, MD (General Surgery)   Medications: Outpatient Medications Prior to Visit  Medication Sig  . amiodarone (PACERONE) 200 MG tablet Take 1 tablet (200 mg total) by mouth daily for 56 days, THEN 0.5 tablets (100 mg total) daily. (Patient taking differently: Take 0.5 tablets (100 mg total) daily.)  . aspirin 81 MG EC tablet Take 81 mg by mouth daily.   Marland Kitchen aspirin-acetaminophen-caffeine (EXCEDRIN MIGRAINE) 250-250-65 MG tablet Take 2 tablets by mouth every 6 (six) hours as needed for headache.   Marland Kitchen azelastine (ASTELIN) 0.1 % nasal spray Place 1 spray into both nostrils 2 (two) times daily.  . ferrous [id number] C-folic acid (TRINSICON / FOLTRIN) capsule Take 1 capsule by mouth 2 (two) times daily after a meal.  . fluticasone (FLONASE) 50 MCG/ACT nasal spray Place 2 sprays into both nostrils daily.  Marland Kitchen ibuprofen (ADVIL) 200 MG tablet Take 800 mg by mouth every 6 (six) hours as needed for headache or moderate pain.  . montelukast (SINGULAIR) 10 MG tablet Take 1 tablet (10 mg total) by mouth at bedtime.  . rosuvastatin (CRESTOR) 40 MG tablet Take 1 tablet (40 mg total) by mouth daily. OFFICE VISIT NEEDED FOR ADDITIONAL REFILLS  . tamsulosin (FLOMAX) 0.4 MG CAPS capsule Take 1 capsule (0.4 mg total) by mouth daily after supper.  . traMADol (ULTRAM) 50 MG tablet Take 1-2 tablets (50-100 mg total) by mouth every 4 (four) hours as needed for moderate pain.  Marland Kitchen XARELTO 20 MG TABS tablet Take 1 tablet (20 mg total) by mouth daily with supper.   No facility-administered medications prior to visit.    Review of Systems  Genitourinary: Positive for difficulty urinating and frequency.  Musculoskeletal: Positive for arthralgias.  All other systems reviewed and are negative.      Objective    BP 125/81 (BP Location: Left Arm, Patient  Position: Sitting, Cuff Size: Large)   Pulse 86   Temp 98.4 F (36.9 C) (Oral)  Resp 16   Ht 5\' 10"  (1.778 m)   Wt 192 lb (87.1 kg)   SpO2 95%   BMI 27.55 kg/m  BP Readings from Last 3 Encounters:  07/31/20 128/72  07/25/20 125/81  06/19/20 133/82   Wt Readings from Last 3 Encounters:  07/31/20 190 lb 3.2 oz (86.3 kg)  07/25/20 192 lb (87.1 kg)  06/19/20 187 lb (84.8 kg)      Physical Exam Vitals reviewed.  Constitutional:      Appearance: He is well-developed.  HENT:     Head: Normocephalic and atraumatic.     Right Ear: External ear normal.     Left Ear: External ear normal.     Nose: Nose normal.  Eyes:     General: No scleral icterus.    Conjunctiva/sclera: Conjunctivae normal.  Cardiovascular:     Rate and Rhythm: Normal rate and regular rhythm.     Heart sounds: Normal heart sounds.  Pulmonary:     Effort: Pulmonary effort is normal.     Breath sounds: Normal breath sounds.  Abdominal:     Palpations: Abdomen is soft.  Genitourinary:    Penis: Normal.      Testes: Normal.  Skin:    General: Skin is warm and dry.  Neurological:     General: No focal deficit present.     Mental Status: He is alert and oriented to person, place, and time.  Psychiatric:        Mood and Affect: Mood normal.        Behavior: Behavior normal.        Thought Content: Thought content normal.        Judgment: Judgment normal.       Last depression screening scores PHQ 2/9 Scores 07/25/2020 05/25/2020 04/06/2019  PHQ - 2 Score 0 0 0  PHQ- 9 Score 0 1 0   Last fall risk screening Fall Risk  05/18/2020  Falls in the past year? 0  Number falls in past yr: 0  Injury with Fall? 0  Risk for fall due to : No Fall Risks  Follow up Falls evaluation completed;Education provided;Falls prevention discussed   Last Audit-C alcohol use screening Alcohol Use Disorder Test (AUDIT) 07/25/2020  1. How often do you have a drink containing alcohol? 4  2. How many drinks containing alcohol  do you have on a typical day when you are drinking? 0  3. How often do you have six or more drinks on one occasion? 1  AUDIT-C Score 5  4. How often during the last year have you found that you were not able to stop drinking once you had started? 0  5. How often during the last year have you failed to do what was normally expected from you because of drinking? 0  6. How often during the last year have you needed a first drink in the morning to get yourself going after a heavy drinking session? 0  7. How often during the last year have you had a feeling of guilt of remorse after drinking? 0  8. How often during the last year have you been unable to remember what happened the night before because you had been drinking? 0  9. Have you or someone else been injured as a result of your drinking? 0  10. Has a relative or friend or a doctor or another health worker been concerned about your drinking or suggested you cut down? 0  Alcohol Use Disorder Identification Test  Final Score (AUDIT) 5  Alcohol Brief Interventions/Follow-up AUDIT Score <7 follow-up not indicated   A score of 3 or more in women, and 4 or more in men indicates increased risk for alcohol abuse, EXCEPT if all of the points are from question 1   No results found for any visits on 07/25/20.  Assessment & Plan    Routine Health Maintenance and Physical Exam  Exercise Activities and Dietary recommendations Goals   None     Immunization History  Administered Date(s) Administered  . Influenza,inj,Quad PF,6+ Mos 03/13/2016, 03/26/2017, 04/07/2018, 04/06/2019  . PFIZER(Purple Top)SARS-COV-2 Vaccination 05/04/2019, 05/25/2019  . Tdap 02/09/2013, 04/22/2018    Health Maintenance  Topic Date Due  . Hepatitis C Screening  Never done  . HIV Screening  Never done  . COVID-19 Vaccine (3 - Booster for Pfizer series) 11/22/2019  . INFLUENZA VACCINE  11/28/2019  . COLONOSCOPY (Pts 45-83yrs Insurance coverage will need to be confirmed)   01/18/2025  . TETANUS/TDAP  04/22/2028  . HPV VACCINES  Aged Out    Discussed health benefits of physical activity, and encouraged him to engage in regular exercise appropriate for his age and condition.  1. Annual physical exam   2. Screening for blood or protein in urine  - POCT urinalysis dipstick  3. Essential hypertension Good control. - Comprehensive metabolic panel  4. Hyperlipidemia, unspecified hyperlipidemia type On rosuvastatin. - Lipid panel  5. Atrial fibrillation with rapid ventricular response (HCC) Status post Maze procedure - CBC with Differential/Platelet - TSH  6. Prostate cancer screening  - PSA   No follow-ups on file.     I, Wilhemena Durie, MD, have reviewed all documentation for this visit. The documentation on 08/06/20 for the exam, diagnosis, procedures, and orders are all accurate and complete.    Ella Guillotte Cranford Mon, MD  [facility] [phone] (phone) [phone] (fax)  [place]

## 2020-07-25 ENCOUNTER — Ambulatory Visit (INDEPENDENT_AMBULATORY_CARE_PROVIDER_SITE_OTHER): Payer: BC Managed Care – PPO | Admitting: Family Medicine

## 2020-07-25 ENCOUNTER — Other Ambulatory Visit: Payer: Self-pay

## 2020-07-25 ENCOUNTER — Encounter: Payer: Self-pay | Admitting: Family Medicine

## 2020-07-25 VITALS — BP 125/81 | HR 86 | Temp 98.4°F | Resp 16 | Ht 70.0 in | Wt 192.0 lb

## 2020-07-25 DIAGNOSIS — I4891 Unspecified atrial fibrillation: Secondary | ICD-10-CM

## 2020-07-25 DIAGNOSIS — I10 Essential (primary) hypertension: Secondary | ICD-10-CM | POA: Diagnosis not present

## 2020-07-25 DIAGNOSIS — Z0000 Encounter for general adult medical examination without abnormal findings: Secondary | ICD-10-CM | POA: Diagnosis not present

## 2020-07-25 DIAGNOSIS — Z1389 Encounter for screening for other disorder: Secondary | ICD-10-CM | POA: Diagnosis not present

## 2020-07-25 DIAGNOSIS — E785 Hyperlipidemia, unspecified: Secondary | ICD-10-CM | POA: Diagnosis not present

## 2020-07-25 DIAGNOSIS — Z125 Encounter for screening for malignant neoplasm of prostate: Secondary | ICD-10-CM

## 2020-07-25 DIAGNOSIS — Z8679 Personal history of other diseases of the circulatory system: Secondary | ICD-10-CM

## 2020-07-25 DIAGNOSIS — Z9889 Other specified postprocedural states: Secondary | ICD-10-CM

## 2020-07-25 LAB — POCT URINALYSIS DIPSTICK
Bilirubin, UA: NEGATIVE
Blood, UA: NEGATIVE
Glucose, UA: NEGATIVE
Ketones, UA: NEGATIVE
Leukocytes, UA: NEGATIVE
Nitrite, UA: NEGATIVE
Protein, UA: NEGATIVE
Spec Grav, UA: 1.015 (ref 1.010–1.025)
Urobilinogen, UA: 0.2 E.U./dL
pH, UA: 6.5 (ref 5.0–8.0)

## 2020-07-25 NOTE — Progress Notes (Signed)
Daily Session Note  Patient Details  Name: Jorge [name] MRN: [id number] Date of Birth: 06/10/1965 Referring Provider:   Flowsheet Row Cardiac Rehab from 05/25/2020 in [facility]  Referring Provider Allred, James MD      Encounter Date: 07/25/2020  Check In:  Session Check In - 07/25/20 0717      Check-In   Supervising physician immediately available to respond to emergencies See telemetry face sheet for immediately available ER MD    Location [facility]    Staff Present Kelly Bollinger, MPA, RN;Amanda Sommer, BA, ACSM CEP, Exercise Physiologist;Joseph Hood RCP,RRT,BSRT    Virtual Visit No    Medication changes reported     No    Fall or balance concerns reported    No    Warm-up and Cool-down Performed on first and last piece of equipment    Resistance Training Performed Yes    VAD Patient? No    PAD/SET Patient? No      Pain Assessment   Currently in Pain? No/denies              Social History   Tobacco Use  Smoking Status Never Smoker  Smokeless Tobacco Never Used    Goals Met:  Independence with exercise equipment Exercise tolerated well Personal goals reviewed No report of cardiac concerns or symptoms Strength training completed today  Goals Unmet:  Not Applicable  Comments: Pt able to follow exercise prescription today without complaint.  Will continue to monitor for progression.    Dr. Mark Miller is Medical Director for [facility] and [name]. 

## 2020-07-26 LAB — CBC WITH DIFFERENTIAL/PLATELET
Basophils Absolute: 0 10*3/uL (ref 0.0–0.2)
Basos: 0 %
EOS (ABSOLUTE): 0.1 10*3/uL (ref 0.0–0.4)
Eos: 2 %
Hematocrit: 40 % (ref 37.5–51.0)
Hemoglobin: 13.3 g/dL (ref 13.0–17.7)
Immature Grans (Abs): 0 10*3/uL (ref 0.0–0.1)
Immature Granulocytes: 0 %
Lymphocytes Absolute: 1.6 10*3/uL (ref 0.7–3.1)
Lymphs: 19 %
MCH: 28.2 pg (ref 26.6–33.0)
MCHC: 33.3 g/dL (ref 31.5–35.7)
MCV: 85 fL (ref 79–97)
Monocytes Absolute: 0.7 10*3/uL (ref 0.1–0.9)
Monocytes: 8 %
Neutrophils Absolute: 6.3 10*3/uL (ref 1.4–7.0)
Neutrophils: 71 %
Platelets: 244 10*3/uL (ref 150–450)
RBC: 4.71 x10E6/uL (ref 4.14–5.80)
RDW: 15.6 % — ABNORMAL HIGH (ref 11.6–15.4)
WBC: 8.7 10*3/uL (ref 3.4–10.8)

## 2020-07-26 LAB — COMPREHENSIVE METABOLIC PANEL
ALT: 33 IU/L (ref 0–44)
AST: 33 IU/L (ref 0–40)
Albumin/Globulin Ratio: 2.3 — ABNORMAL HIGH (ref 1.2–2.2)
Albumin: 4.9 g/dL (ref 3.8–4.9)
Alkaline Phosphatase: 65 IU/L (ref 44–121)
BUN/Creatinine Ratio: 17 (ref 9–20)
BUN: 16 mg/dL (ref 6–24)
Bilirubin Total: 0.6 mg/dL (ref 0.0–1.2)
CO2: 24 mmol/L (ref 20–29)
Calcium: 9.7 mg/dL (ref 8.7–10.2)
Chloride: 101 mmol/L (ref 96–106)
Creatinine, Ser: 0.93 mg/dL (ref 0.76–1.27)
Globulin, Total: 2.1 g/dL (ref 1.5–4.5)
Glucose: 94 mg/dL (ref 65–99)
Potassium: 4.2 mmol/L (ref 3.5–5.2)
Sodium: 139 mmol/L (ref 134–144)
Total Protein: 7 g/dL (ref 6.0–8.5)
eGFR: 98 mL/min/{1.73_m2} (ref >59)

## 2020-07-26 LAB — LIPID PANEL
Chol/HDL Ratio: 2.6 ratio (ref 0.0–5.0)
Cholesterol, Total: 151 mg/dL (ref 100–199)
HDL: 59 mg/dL (ref >39)
LDL Chol Calc (NIH): 79 mg/dL (ref 0–99)
Triglycerides: 68 mg/dL (ref 0–149)
VLDL Cholesterol Cal: 13 mg/dL (ref 5–40)

## 2020-07-26 LAB — PSA: Prostate Specific Ag, Serum: 1.4 ng/mL (ref 0.0–4.0)

## 2020-07-26 LAB — TSH: TSH: 1.18 u[IU]/mL (ref 0.450–4.500)

## 2020-07-31 ENCOUNTER — Other Ambulatory Visit: Payer: Self-pay

## 2020-07-31 ENCOUNTER — Ambulatory Visit: Payer: BC Managed Care – PPO | Admitting: Internal Medicine

## 2020-07-31 ENCOUNTER — Encounter: Payer: Self-pay | Admitting: Internal Medicine

## 2020-07-31 VITALS — BP 128/72 | HR 95 | Ht 70.0 in | Wt 190.2 lb

## 2020-07-31 DIAGNOSIS — D6869 Other thrombophilia: Secondary | ICD-10-CM

## 2020-07-31 DIAGNOSIS — I4819 Other persistent atrial fibrillation: Secondary | ICD-10-CM | POA: Diagnosis not present

## 2020-07-31 DIAGNOSIS — I2510 Atherosclerotic heart disease of native coronary artery without angina pectoris: Secondary | ICD-10-CM

## 2020-07-31 DIAGNOSIS — I10 Essential (primary) hypertension: Secondary | ICD-10-CM | POA: Diagnosis not present

## 2020-07-31 NOTE — Patient Instructions (Addendum)
Medication Instructions:  Stop Amiodarone Stop Xarelto  Your physician recommends that you continue on your current medications as directed. Please refer to the Current Medication list given to you today.  Labwork: None ordered.  Testing/Procedures: None ordered.  Follow-Up: Your physician wants you to follow-up in: 02/05/21 with Dr. Rayann Heman at 10 am.   Any Other Special Instructions Will Be Listed Below (If Applicable).  If you need a refill on your cardiac medications before your next appointment, please call your pharmacy.

## 2020-07-31 NOTE — Progress Notes (Signed)
PCP: Jerrol Banana., MD Primary Cardiologist: Dr Angelena Form Primary EP: Dr Rayann Heman  Jorge [name] is a 55 y.o. male who presents today for routine electrophysiology followup.  Since last being seen in our clinic, the patient reports doing very well.  Today, he denies symptoms of palpitations, chest pain, shortness of breath,  lower extremity edema, dizziness, presyncope, or syncope.  The patient is otherwise without complaint today.   Past Medical History:  Diagnosis Date  . Allergic rhinitis, seasonal   . Anticoagulant long-term use managed by cardiology   asa and xarelto  . Bilateral nephrolithiasis    urologist--- dr Junious Silk ([place] office)  . Coronary artery disease cardiologist--- dr Angelena Form   nuclear stress test 10-18-2016 w/ normal perfusion;  cardiac cath 12-23-2017, PCI w/ DES to midLAD and mild RCA/ moderate CFx diease nonobstructive , normal LVF;  ETT 07-13-2019 negative for ischemia  . Dysrhythmia    Afib  . History of kidney stones   . Hyperlipidemia   . Hypertension    followed by pcp and cardiology  . OA (osteoarthritis)    knees  . OSA on CPAP    12-31-2019  per pt uses cpap every night  . PAF (paroxysmal atrial fibrillation) ([place]) 2011   followed by dr Rayann Heman (ep)---  first dx 2011;  s/p DCCV's and ablation x2 in 2015 and last one 03/ 2018, and s/p loop recorder 05/ 2021;   failed multaq, flecainide, and amiodarone  . Pneumonia   . Right knee meniscal tear   . S/P ablation of atrial fibrillation followed by dr Imer Foxworth   x3  last one 2018  . S/P drug eluting coronary stent placement 12/23/2017   x1  to midLAD  . S/P minimally-invasive maze operation for atrial fibrillation 03/30/2020   Complete bilateral atrial lesion set using cryothermy and bipolar radiofrequency ablation with clipping of LA appendage via right mini thoracotomy approach  . SOB (shortness of breath)    12-31-2019  per pt only when he has atrial fib , otherwise no issues with sob  with activities  . Status post placement of implantable loop recorder 09/08/2019   followed by dr Rayann Heman   Past Surgical History:  Procedure Laterality Date  . APPENDECTOMY  1975  . ATRIAL FIBRILLATION ABLATION N/A 11/02/2013   PVI by Dr Rayann Heman  . ATRIAL FIBRILLATION ABLATION N/A 03/29/2014   PVI and CTI by Dr Rayann Heman  . ATRIAL FIBRILLATION ABLATION N/A 07/04/2016   Procedure: Atrial Fibrillation Ablation;  Surgeon: Thompson Grayer, MD;  Location: [facility];  Service: Cardiovascular;  Laterality: N/A;  . CARDIOVERSION N/A 10/15/2013   Procedure: CARDIOVERSION;  Surgeon: Fay Records, MD;  Location: [facility] ENDOSCOPY;  Service: Cardiovascular;  Laterality: N/A;  . CLIPPING OF ATRIAL APPENDAGE  03/30/2020   Procedure: CLIPPING OF ATRIAL APPENDAGE USING ATRICURE ATRICLIP [id number];  Surgeon: Rexene Alberts, MD;  Location: [place];  Service: Open Heart Surgery;;  . COLONOSCOPY  2016  . CORONARY STENT INTERVENTION N/A 12/23/2017   Procedure: CORONARY STENT INTERVENTION;  Surgeon: Burnell Blanks, MD;  Location: [place] [facility];  Service: Cardiovascular;  Laterality: N/A;  . EXTRACORPOREAL SHOCK WAVE LITHOTRIPSY  2005;  2013  . implantable loop recorder placement  09/08/2019   (done in office)   Medtronic Reveal Linq model LNQ 22 (SN [id number]) implantable loop recorder implanted in office by Dr Rayann Heman for afib management  . INGUINAL HERNIA REPAIR Left 05/31/2015   Procedure: HERNIA REPAIR INGUINAL ADULT;  Surgeon: Robert Bellow, MD;  Location: [facility];  Service: General;  Laterality: Left;  . KNEE ARTHROSCOPY Left 2009   @[facility]   . KNEE ARTHROSCOPY WITH MEDIAL MENISECTOMY Right 01/04/2020   Procedure: KNEE ARTHROSCOPY WITH PARTIAL MEDIAL MENISECTOMY;  Surgeon: Nicholes Stairs, MD;  Location: [facility];  Service: Orthopedics;  Laterality: Right;  . MINIMALLY INVASIVE MAZE PROCEDURE N/A 03/30/2020   Procedure: MINIMALLY INVASIVE MAZE PROCEDURE;  Surgeon: Rexene Alberts,  MD;  Location: [place];  Service: Open Heart Surgery;  Laterality: N/A;  . RHINOPLASTY  2006    @[facility]   . RIGHT/LEFT HEART CATH AND CORONARY ANGIOGRAPHY N/A 12/23/2017   Procedure: RIGHT/LEFT HEART CATH AND CORONARY ANGIOGRAPHY;  Surgeon: Burnell Blanks, MD;  Location: [place] [facility];  Service: Cardiovascular;  Laterality: N/A;  . RIGHT/LEFT HEART CATH AND CORONARY ANGIOGRAPHY N/A 02/02/2020   Procedure: RIGHT/LEFT HEART CATH AND CORONARY ANGIOGRAPHY;  Surgeon: Burnell Blanks, MD;  Location: [place] [facility];  Service: Cardiovascular;  Laterality: N/A;  . ROBOT ASSISTED INGUINAL HERNIA REPAIR Left 05-01-2013   @[facility]   . TEE WITHOUT CARDIOVERSION N/A 11/01/2013   Procedure: TRANSESOPHAGEAL ECHOCARDIOGRAM (TEE);  Surgeon: Candee Furbish, MD;  Location: [facility] ENDOSCOPY;  Service: Cardiovascular;  Laterality: N/A;  . TEE WITHOUT CARDIOVERSION N/A 03/28/2014   Procedure: TRANSESOPHAGEAL ECHOCARDIOGRAM (TEE);  Surgeon: Larey Dresser, MD;  Location: [place];  Service: Cardiovascular;  Laterality: N/A;  . TEE WITHOUT CARDIOVERSION N/A 02/02/2020   Procedure: TRANSESOPHAGEAL ECHOCARDIOGRAM (TEE);  Surgeon: Werner Lean, MD;  Location: [facility] ENDOSCOPY;  Service: Cardiovascular;  Laterality: N/A;  . TEE WITHOUT CARDIOVERSION N/A 03/30/2020   Procedure: TRANSESOPHAGEAL ECHOCARDIOGRAM (TEE);  Surgeon: Rexene Alberts, MD;  Location: [place];  Service: Open Heart Surgery;  Laterality: N/A;    ROS- all systems are reviewed and negatives except as per HPI above  Current Outpatient Medications  Medication Sig Dispense Refill  . amiodarone (PACERONE) 200 MG tablet Take 1 tablet (200 mg total) by mouth daily for 56 days, THEN 0.5 tablets (100 mg total) daily. 90 tablet 3  . aspirin 81 MG EC tablet Take 81 mg by mouth daily.     Marland Kitchen aspirin-acetaminophen-caffeine (EXCEDRIN MIGRAINE) 250-250-65 MG tablet Take 2 tablets by mouth every 6 (six) hours as needed for headache.     Marland Kitchen azelastine  (ASTELIN) 0.1 % nasal spray Place 1 spray into both nostrils 2 (two) times daily. 30 mL 12  . ferrous [id number] C-folic acid (TRINSICON / FOLTRIN) capsule Take 1 capsule by mouth 2 (two) times daily after a meal. 60 capsule 1  . fluticasone (FLONASE) 50 MCG/ACT nasal spray Place 2 sprays into both nostrils daily. 16 g 3  . ibuprofen (ADVIL) 200 MG tablet Take 800 mg by mouth every 6 (six) hours as needed for headache or moderate pain.    . montelukast (SINGULAIR) 10 MG tablet Take 1 tablet (10 mg total) by mouth at bedtime. 90 tablet 3  . rosuvastatin (CRESTOR) 40 MG tablet Take 1 tablet (40 mg total) by mouth daily. OFFICE VISIT NEEDED FOR ADDITIONAL REFILLS 30 tablet 0  . tamsulosin (FLOMAX) 0.4 MG CAPS capsule Take 1 capsule (0.4 mg total) by mouth daily after supper. 30 capsule 3  . traMADol (ULTRAM) 50 MG tablet Take 1-2 tablets (50-100 mg total) by mouth every 4 (four) hours as needed for moderate pain. 28 tablet 0  . XARELTO 20 MG TABS tablet Take 1 tablet (20 mg total) by mouth daily with  supper. 90 tablet 1   No current facility-administered medications for this visit.    Physical Exam: Vitals:   07/31/20 1554  BP: 128/72  Pulse: 95  SpO2: 94%  Weight: 190 lb 3.2 oz (86.3 kg)  Height: 5\' 10"  (1.778 m)    GEN- The patient is well appearing, alert and oriented x 3 today.   Head- normocephalic, atraumatic Eyes-  Sclera clear, conjunctiva pink Ears- hearing intact Oropharynx- clear Lungs- Clear to ausculation bilaterally, normal work of breathing Heart- Regular rate and rhythm, no murmurs, rubs or gallops, PMI not laterally displaced GI- soft, NT, ND, + BS Extremities- no clubbing, cyanosis, or edema  Wt Readings from Last 3 Encounters:  07/31/20 190 lb 3.2 oz (86.3 kg)  07/25/20 192 lb (87.1 kg)  06/19/20 187 lb (84.8 kg)    EKG tracing ordered today is personally reviewed and shows sinus  Assessment and Plan:  1. Paroxysmal atrial fibrillation S/p MAZE  with LAA clip No afib since last visit Stop amiodarone chads2vasc score is 2.  He is s/p LAA clip and wishes to stop xarelto. If AF returns by ILR, we will consider restarting Bertram therapy  2. HTN Stable No change required today  3. CAD No ischemic symptoms  Risks, benefits and potential toxicities for medications prescribed and/or refilled reviewed with patient today.   Return in 6 months  Thompson Grayer MD, [facility] 07/31/2020 4:11 PM

## 2020-08-01 ENCOUNTER — Encounter: Payer: BC Managed Care – PPO | Attending: Internal Medicine

## 2020-08-01 DIAGNOSIS — Z8679 Personal history of other diseases of the circulatory system: Secondary | ICD-10-CM | POA: Diagnosis present

## 2020-08-01 DIAGNOSIS — Z9889 Other specified postprocedural states: Secondary | ICD-10-CM | POA: Insufficient documentation

## 2020-08-01 NOTE — Progress Notes (Signed)
Daily Session Note  Patient Details  Name: Jorge [name] MRN: [id number] Date of Birth: 1965-10-08 Referring Provider:   Flowsheet Row Cardiac Rehab from 05/25/2020 in [facility]  Referring Provider Thompson Grayer MD      Encounter Date: 08/01/2020  Check In:  Session Check In - 08/01/20 0714      Check-In   Supervising physician immediately available to respond to emergencies See telemetry face sheet for immediately available ER MD    Location [facility]-Cardiac & Pulmonary Rehab    Staff Present Birdie Sons, MPA, Elveria Rising, BA, ACSM CEP, Exercise Physiologist;Kara Eliezer Bottom, MS Exercise Physiologist    Virtual Visit No    Medication changes reported     Yes    Comments no longer taking amioderone or xarelto    Fall or balance concerns reported    No    Warm-up and Cool-down Performed on first and last piece of equipment    Resistance Training Performed Yes    VAD Patient? No    PAD/SET Patient? No      Pain Assessment   Currently in Pain? No/denies              Social History   Tobacco Use  Smoking Status Never Smoker  Smokeless Tobacco Never Used    Goals Met:  Independence with exercise equipment Exercise tolerated well No report of cardiac concerns or symptoms Strength training completed today  Goals Unmet:  Not Applicable  Comments: Pt able to follow exercise prescription today without complaint.  Will continue to monitor for progression.    Dr. Emily Filbert is Medical Director for [place] and [name].

## 2020-08-02 ENCOUNTER — Other Ambulatory Visit: Payer: Self-pay | Admitting: Family Medicine

## 2020-08-02 DIAGNOSIS — E785 Hyperlipidemia, unspecified: Secondary | ICD-10-CM

## 2020-08-03 ENCOUNTER — Other Ambulatory Visit: Payer: Self-pay

## 2020-08-03 DIAGNOSIS — Z9889 Other specified postprocedural states: Secondary | ICD-10-CM

## 2020-08-03 DIAGNOSIS — Z8679 Personal history of other diseases of the circulatory system: Secondary | ICD-10-CM

## 2020-08-03 MED ORDER — RIVAROXABAN 20 MG PO TABS: ORAL_TABLET | ORAL | 0 refills | Status: DC

## 2020-08-03 NOTE — Progress Notes (Signed)
Daily Session Note  Patient Details  Name: Jorge [name] MRN: [id number] Date of Birth: May 20, 1965 Referring Provider:   Flowsheet Row Cardiac Rehab from 05/25/2020 in [facility] Cardiac and Pulmonary Rehab  Referring Provider Thompson Grayer MD      Encounter Date: 08/03/2020  Check In:  Session Check In - 08/03/20 0718      Check-In   Supervising physician immediately available to respond to emergencies See telemetry face sheet for immediately available ER MD    Location [facility]-Cardiac & Pulmonary Rehab    Staff Present Birdie Sons, MPA, Mauricia Area, BS, ACSM CEP, Exercise Physiologist;Amanda Oletta Darter, BA, ACSM CEP, Exercise Physiologist    Virtual Visit No    Medication changes reported     No    Fall or balance concerns reported    No    Warm-up and Cool-down Performed on first and last piece of equipment    Resistance Training Performed Yes    VAD Patient? No    PAD/SET Patient? No      Pain Assessment   Currently in Pain? No/denies              Social History   Tobacco Use  Smoking Status Never Smoker  Smokeless Tobacco Never Used    Goals Met:  Independence with exercise equipment Exercise tolerated well No report of cardiac concerns or symptoms Strength training completed today  Goals Unmet:  Not Applicable  Comments: Pt able to follow exercise prescription today without complaint.  Will continue to monitor for progression.    Dr. Emily Filbert is Medical Director for [place] and [name].

## 2020-08-03 NOTE — Addendum Note (Signed)
Addended by: Darrell Jewel on: 08/03/2020 03:31 PM   Modules accepted: Orders

## 2020-08-14 ENCOUNTER — Ambulatory Visit (INDEPENDENT_AMBULATORY_CARE_PROVIDER_SITE_OTHER): Payer: BC Managed Care – PPO

## 2020-08-14 DIAGNOSIS — I4819 Other persistent atrial fibrillation: Secondary | ICD-10-CM

## 2020-08-16 ENCOUNTER — Encounter: Payer: Self-pay | Admitting: *Deleted

## 2020-08-16 DIAGNOSIS — Z8679 Personal history of other diseases of the circulatory system: Secondary | ICD-10-CM

## 2020-08-16 DIAGNOSIS — Z9889 Other specified postprocedural states: Secondary | ICD-10-CM

## 2020-08-16 LAB — CUP PACEART REMOTE DEVICE CHECK
Date Time Interrogation Session: 20220420070828
Implantable Pulse Generator Implant Date: 20210512

## 2020-08-16 NOTE — Progress Notes (Signed)
Cardiac Individual Treatment Plan  Patient Details  Name: Jorge [name] MRN: [id number] Date of Birth: [birth year] Referring Provider:   Flowsheet Row Cardiac Rehab from 05/25/2020 in [facility] Cardiac and Pulmonary Rehab  Referring Provider Thompson Grayer MD      Initial Encounter Date:  Flowsheet Row Cardiac Rehab from 05/25/2020 in [facility] Cardiac and Pulmonary Rehab  Date 05/25/20      Visit Diagnosis: Status post Maze operation for atrial fibrillation  Patient's Home Medications on Admission:  Current Outpatient Medications:  .  aspirin 81 MG EC tablet, Take 81 mg by mouth daily. , Disp: , Rfl:  .  aspirin-acetaminophen-caffeine (EXCEDRIN MIGRAINE) 250-250-65 MG tablet, Take 2 tablets by mouth every 6 (six) hours as needed for headache. , Disp: , Rfl:  .  azelastine (ASTELIN) 0.1 % nasal spray, Place 1 spray into both nostrils 2 (two) times daily., Disp: 30 mL, Rfl: 12 .  ferrous [id number] C-folic acid (TRINSICON / FOLTRIN) capsule, Take 1 capsule by mouth 2 (two) times daily after a meal., Disp: 60 capsule, Rfl: 1 .  fluticasone (FLONASE) 50 MCG/ACT nasal spray, Place 2 sprays into both nostrils daily., Disp: 16 g, Rfl: 3 .  ibuprofen (ADVIL) 200 MG tablet, Take 800 mg by mouth every 6 (six) hours as needed for headache or moderate pain., Disp: , Rfl:  .  montelukast (SINGULAIR) 10 MG tablet, Take 1 tablet (10 mg total) by mouth at bedtime., Disp: 90 tablet, Rfl: 3 .  rivaroxaban (XARELTO) 20 MG TABS tablet, As needed if afib returns. Once daily with supper. Call and let us know if you need to use it., Disp: 30 tablet, Rfl: 0 .  rosuvastatin (CRESTOR) 40 MG tablet, Take 1 tablet (40 mg total) by mouth daily., Disp: 90 tablet, Rfl: 3 .  tamsulosin (FLOMAX) 0.4 MG CAPS capsule, Take 1 capsule (0.4 mg total) by mouth daily after supper., Disp: 30 capsule, Rfl: 3 .  traMADol (ULTRAM) 50 MG tablet, Take 1-2 tablets (50-100 mg total) by mouth every 4 (four) hours as needed for  moderate pain., Disp: 28 tablet, Rfl: 0  Past Medical History: Past Medical History:  Diagnosis Date  . Allergic rhinitis, seasonal   . Anticoagulant long-term use managed by cardiology   asa and xarelto  . Bilateral nephrolithiasis    urologist--- dr Junious Silk ([place] office)  . Coronary artery disease cardiologist--- dr Angelena Form   nuclear stress test 10-18-2016 w/ normal perfusion;  cardiac cath 12-23-2017, PCI w/ DES to midLAD and mild RCA/ moderate CFx diease nonobstructive , normal LVF;  ETT 07-13-2019 negative for ischemia  . Dysrhythmia    Afib  . History of kidney stones   . Hyperlipidemia   . Hypertension    followed by pcp and cardiology  . OA (osteoarthritis)    knees  . OSA on CPAP    12-31-2019  per pt uses cpap every night  . PAF (paroxysmal atrial fibrillation) ([place]) 2011   followed by dr Rayann Heman (ep)---  first dx 2011;  s/p DCCV's and ablation x2 in 2015 and last one 03/ 2018, and s/p loop recorder 05/ 2021;   failed multaq, flecainide, and amiodarone  . Pneumonia   . Right knee meniscal tear   . S/P ablation of atrial fibrillation followed by dr allred   x3  last one 2018  . S/P drug eluting coronary stent placement 12/23/2017   x1  to midLAD  . S/P minimally-invasive maze operation for atrial fibrillation 03/30/2020   Complete bilateral  atrial lesion set using cryothermy and bipolar radiofrequency ablation with clipping of LA appendage via right mini thoracotomy approach  . SOB (shortness of breath)    12-31-2019  per pt only when he has atrial fib , otherwise no issues with sob with activities  . Status post placement of implantable loop recorder 09/08/2019   followed by dr allred    Tobacco Use: Social History   Tobacco Use  Smoking Status Never Smoker  Smokeless Tobacco Never Used    Labs: Recent Review Flowsheet Data    Labs for ITP Cardiac and Pulmonary Rehab Latest Ref Rng & Units 03/30/2020 03/30/2020 03/30/2020 03/30/2020 07/25/2020   Cholestrol  100 - 199 mg/dL - - - - 151   LDLCALC 0 - 99 mg/dL - - - - 79   HDL >39 mg/dL - - - - 59   Trlycerides 0 - 149 mg/dL - - - - 68   Hemoglobin A1c 4.8 - 5.6 % - - - - -   PHART 7.350 - 7.450 - 7.328(L) 7.267(L) 7.358 -   PCO2ART 32.0 - 48.0 mmHg - 45.9 51.5(H) 34.4 -   HCO3 20.0 - 28.0 mmol/L - 24.2 23.6 19.3(L) -   TCO2 22 - 32 mmol/L _0 20(L) -   ACIDBASEDEF 0.0 - 2.0 mmol/L - 2.0 4.0(H) 6.0(H) -   O2SAT % - 99.0 92.0 90.0 -       Exercise Target Goals: Exercise Program Goal: Individual exercise prescription set using results from initial 6 min walk test and THRR while considering  patient's activity barriers and safety.   Exercise Prescription Goal: Initial exercise prescription builds to 30-45 minutes a day of aerobic activity, 2-3 days per week.  Home exercise guidelines will be given to patient during program as part of exercise prescription that the participant will acknowledge.   Education: Aerobic Exercise: - Group verbal and visual presentation on the components of exercise prescription. Introduces F.I.T.T principle from ACSM for exercise prescriptions.  Reviews F.I.T.T. principles of aerobic exercise including progression. Written material given at graduation. Flowsheet Row Cardiac Rehab from 08/03/2020 in [facility] Cardiac and Pulmonary Rehab  Date 06/15/20  Educator jh  Instruction Review Code 1- Verbalizes Understanding      Education: Resistance Exercise: - Group verbal and visual presentation on the components of exercise prescription. Introduces F.I.T.T principle from ACSM for exercise prescriptions  Reviews F.I.T.T. principles of resistance exercise including progression. Written material given at graduation. Flowsheet Row Cardiac Rehab from 08/03/2020 in [facility] Cardiac and Pulmonary Rehab  Date 06/22/20  Educator [facility]  Instruction Review Code 1- Verbalizes Understanding       Education: Exercise & Equipment Safety: - Individual verbal instruction and demonstration  of equipment use and safety with use of the equipment. Flowsheet Row Cardiac Rehab from 08/03/2020 in [facility] Cardiac and Pulmonary Rehab  Date 05/18/20  Educator [facility]  Instruction Review Code 1- Verbalizes Understanding      Education: Exercise Physiology & General Exercise Guidelines: - Group verbal and written instruction with models to review the exercise physiology of the cardiovascular system and associated critical values. Provides general exercise guidelines with specific guidelines to those with heart or lung disease.    Education: Flexibility, Balance, Mind/Body Relaxation: - Group verbal and visual presentation with interactive activity on the components of exercise prescription. Introduces F.I.T.T principle from ACSM for exercise prescriptions. Reviews F.I.T.T. principles of flexibility and balance exercise training including progression. Also discusses the mind body connection.  Reviews various relaxation techniques to  help reduce and manage stress (i.e. Deep breathing, progressive muscle relaxation, and visualization). Balance handout provided to take home. Written material given at graduation.   Activity Barriers & Risk Stratification:  Activity Barriers & Cardiac Risk Stratification - 05/25/20 0928      Activity Barriers & Cardiac Risk Stratification   Activity Barriers Joint Problems;Shortness of Breath;Muscular Weakness;Other (comment);Incisional Pain    Comments bilateral knee surgery (most recent meniscus tear in 9/20200, incisional soreness, weakness in chest from incision    Cardiac Risk Stratification Moderate           6 Minute Walk:  6 Minute Walk    Row Name 05/25/20 0926         6 Minute Walk   Phase Initial     Distance 1515 feet     Walk Time 6 minutes     # of Rest Breaks 0     MPH 2.87     METS 4.34     RPE 10     Perceived Dyspnea  1.5     VO2 Peak 15.19     Symptoms Yes (comment)     Comments incisional soreness 2/10     Resting HR 93 bpm      Resting BP 122/64     Resting Oxygen Saturation  98 %     Exercise Oxygen Saturation  during 6 min walk 96 %     Max Ex. HR 108 bpm     Max Ex. BP 134/64     2 Minute Post BP 108/60            Oxygen Initial Assessment:   Oxygen Re-Evaluation:   Oxygen Discharge (Final Oxygen Re-Evaluation):   Initial Exercise Prescription:  Initial Exercise Prescription - 05/25/20 0900      Date of Initial Exercise RX and Referring Provider   Date 05/25/20    Referring Provider Thompson Grayer MD      Treadmill   MPH 2.8    Grade 2    Minutes 15    METs 3.92      Recumbant Elliptical   Level 2    RPM 50    Minutes 15    METs 3      Elliptical   Level 1    Speed 4.4    Minutes 15      Prescription Details   Frequency (times per week) 2    Duration Progress to 30 minutes of continuous aerobic without signs/symptoms of physical distress      Intensity   THRR 40-80% of Max Heartrate 122-151    Ratings of Perceived Exertion 11-13    Perceived Dyspnea 0-4      Progression   Progression Continue to progress workloads to maintain intensity without signs/symptoms of physical distress.      Resistance Training   Training Prescription Yes    Weight 6 lb    Reps 10-15           Perform Capillary Blood Glucose checks as needed.  Exercise Prescription Changes:  Exercise Prescription Changes    Row Name 05/25/20 0900 06/05/20 1700 06/21/20 0800 06/22/20 0700 07/04/20 1300     Response to Exercise   Blood Pressure (Admit) 122/64 132/82 126/78 -- 122/64   Blood Pressure (Exercise) 134/64 122/66 136/72 -- 138/74   Blood Pressure (Exit) 108/60 112/62 116/66 -- 122/62   Heart Rate (Admit) 93 bpm 94 bpm 91 bpm -- 62 bpm   Heart Rate (Exercise) 108 bpm  120 bpm 114 bpm -- 121 bpm   Heart Rate (Exit) 96 bpm 105 bpm 102 bpm -- 101 bpm   Oxygen Saturation (Admit) 97 % -- -- -- --   Oxygen Saturation (Exercise) 96 % -- -- -- --   Rating of Perceived Exertion (Exercise) 10 13 14  --  15   Perceived Dyspnea (Exercise) 1.5 -- -- -- --   Symptoms incisional soreness 2/10 -- none -- none   Comments walk test results second day -- -- --   Duration -- -- Continue with 30 min of aerobic exercise without signs/symptoms of physical distress. -- Continue with 30 min of aerobic exercise without signs/symptoms of physical distress.   Intensity -- -- THRR unchanged -- THRR unchanged     Progression   Progression -- Continue to progress workloads to maintain intensity without signs/symptoms of physical distress. Continue to progress workloads to maintain intensity without signs/symptoms of physical distress. -- Continue to progress workloads to maintain intensity without signs/symptoms of physical distress.   Average METs -- 4 5.55 -- 5.3     Resistance Training   Training Prescription -- Yes Yes -- Yes   Weight -- 6 lb 10 lb -- 10 lb   Reps -- 10-15 10-15 -- 10-15     Interval Training   Interval Training -- -- Yes -- Yes   Equipment -- -- Treadmill -- Treadmill   Comments -- -- 3%-8% grade 1 min -- 3%-8% grade 1 min     Treadmill   MPH -- 3.5 3.5 -- 3.5   Grade -- 2 8 -- 9.5   Minutes -- 15 15 -- 15   METs -- 4.64 7.5 -- --     Recumbant Elliptical   Level -- -- 3 -- --   Minutes -- -- 15 -- --   METs -- -- 3.6 -- --     Elliptical   Level -- -- 2 -- 5   Speed -- -- 4.4 -- --   Minutes -- -- 15 -- 15     Home Exercise Plan   Plans to continue exercise at -- -- -- Home (comment)  walking, elliptical Home (comment)  walking, elliptical   Frequency -- -- -- Add 3 additional days to program exercise sessions. Add 3 additional days to program exercise sessions.   Initial Home Exercises Provided -- -- -- 06/22/20 06/22/20   Row Name 07/18/20 1400 08/02/20 1300           Response to Exercise   Blood Pressure (Admit) 140/80 108/64      Blood Pressure (Exercise) 150/60 122/58      Blood Pressure (Exit) 106/68 104/60      Heart Rate (Admit) 64 bpm 87 bpm      Heart  Rate (Exercise) 115 bpm 128 bpm      Heart Rate (Exit) 100 bpm 98 bpm      Rating of Perceived Exertion (Exercise) 14 14      Symptoms none none      Duration Continue with 30 min of aerobic exercise without signs/symptoms of physical distress. Continue with 30 min of aerobic exercise without signs/symptoms of physical distress.      Intensity THRR unchanged THRR unchanged             Progression   Progression Continue to progress workloads to maintain intensity without signs/symptoms of physical distress. Continue to progress workloads to maintain intensity without signs/symptoms of physical distress.      Average  METs 7.23 9.4             Resistance Training   Training Prescription Yes --      Weight 12 lb --      Reps 10-15 --             Interval Training   Interval Training Yes Yes      Equipment Treadmill;Elliptical;REL-XR Treadmill;Elliptical;REL-XR      Comments going up to 9% grade, interval program on elliptical going up to 9% grade, interval program on elliptical             Treadmill   MPH 4 4.5      Grade 9 8      Minutes 15 30      METs 9.8 9.41             Elliptical   Level 6 --      Speed 4.4 --      Minutes 15 --      METs 4.5 --             REL-XR   Level 8 --      Minutes 15 --      METs 7.4 --             Home Exercise Plan   Plans to continue exercise at Home (comment)  walking, elliptical --      Frequency Add 3 additional days to program exercise sessions. --      Initial Home Exercises Provided 06/22/20 --             Exercise Comments:  Exercise Comments    Row Name 05/30/20 0807           Exercise Comments First full day of exercise!  Patient was oriented to gym and equipment including functions, settings, policies, and procedures.  Patient's individual exercise prescription and treatment plan were reviewed.  All starting workloads were established based on the results of the 6 minute walk test done at initial orientation visit.   The plan for exercise progression was also introduced and progression will be customized based on patient's performance and goals.              Exercise Goals and Review:  Exercise Goals    Row Name 05/25/20 0930             Exercise Goals   Increase Physical Activity Yes       Intervention Provide advice, education, support and counseling about physical activity/exercise needs.;Develop an individualized exercise prescription for aerobic and resistive training based on initial evaluation findings, risk stratification, comorbidities and participant's personal goals.       Expected Outcomes Short Term: Attend rehab on a regular basis to increase amount of physical activity.;Long Term: Add in home exercise to make exercise part of routine and to increase amount of physical activity.;Long Term: Exercising regularly at least 3-5 days a week.       Increase Strength and Stamina Yes       Intervention Provide advice, education, support and counseling about physical activity/exercise needs.;Develop an individualized exercise prescription for aerobic and resistive training based on initial evaluation findings, risk stratification, comorbidities and participant's personal goals.       Expected Outcomes Short Term: Increase workloads from initial exercise prescription for resistance, speed, and METs.;Long Term: Improve cardiorespiratory fitness, muscular endurance and strength as measured by increased METs and functional capacity (6MWT);Short Term: Perform resistance training exercises routinely during rehab  and add in resistance training at home       Able to understand and use rate of perceived exertion (RPE) scale Yes       Intervention Provide education and explanation on how to use RPE scale       Expected Outcomes Short Term: Able to use RPE daily in rehab to express subjective intensity level;Long Term:  Able to use RPE to guide intensity level when exercising independently       Able to  understand and use Dyspnea scale Yes       Intervention Provide education and explanation on how to use Dyspnea scale       Expected Outcomes Short Term: Able to use Dyspnea scale daily in rehab to express subjective sense of shortness of breath during exertion;Long Term: Able to use Dyspnea scale to guide intensity level when exercising independently       Knowledge and understanding of Target Heart Rate Range (THRR) Yes       Intervention Provide education and explanation of THRR including how the numbers were predicted and where they are located for reference       Expected Outcomes Short Term: Able to state/look up THRR;Short Term: Able to use daily as guideline for intensity in rehab;Long Term: Able to use THRR to govern intensity when exercising independently       Able to check pulse independently Yes       Intervention Provide education and demonstration on how to check pulse in carotid and radial arteries.;Review the importance of being able to check your own pulse for safety during independent exercise       Expected Outcomes Short Term: Able to explain why pulse checking is important during independent exercise;Long Term: Able to check pulse independently and accurately       Understanding of Exercise Prescription Yes       Intervention Provide education, explanation, and written materials on patient's individual exercise prescription       Expected Outcomes Short Term: Able to explain program exercise prescription;Long Term: Able to explain home exercise prescription to exercise independently              Exercise Goals Re-Evaluation :  Exercise Goals Re-Evaluation    Row Name 05/30/20 [phone] 06/05/20 1710 06/21/20 0841 06/22/20 0737 06/29/20 0722     Exercise Goal Re-Evaluation   Exercise Goals Review Understanding of Exercise Prescription;Knowledge and understanding of Target Heart Rate Range (THRR);Able to understand and use rate of perceived exertion (RPE) scale;Able to understand  and use Dyspnea scale -- Increase Physical Activity;Increase Strength and Stamina;Understanding of Exercise Prescription Increase Physical Activity;Increase Strength and Stamina;Understanding of Exercise Prescription;Able to understand and use rate of perceived exertion (RPE) scale;Able to understand and use Dyspnea scale;Knowledge and understanding of Target Heart Rate Range (THRR);Able to check pulse independently Increase Physical Activity;Increase Strength and Stamina;Understanding of Exercise Prescription   Comments Reviewed RPE and dyspnea scales, THR and program prescription with pt today.  Pt voiced understanding and was given a copy of goals to take home. Caedmon has tolerated exercise well in his first 2 sessions.  We will monitor progress. Arbor is doing well in rehab.  He is now doing intervals on the treadmill using the incline up to 8%.  We will continue to monitor his progress. Reviewed home exercise with pt today.  Pt plans to continue walking and using his elliptical at home for exercise.  Reviewed THR, pulse, RPE, sign and symptoms, pulse oximetery and when to  call 911 or MD.  Also discussed weather considerations and indoor options.  Pt voiced understanding. Granger is doing well in rehab.  He is exercising on his off days by walking and using his elliptical.  He was able to help someone move the other day.  He has plans to go play golf today.   Expected Outcomes Short: Use RPE daily to regulate intensity. Long: Follow program prescription in THR. Short: attend consistently Long: improve MET level Short: Continue to add in intervals and work up to them on elliptical  Long: Continue to improve stamina. Short: Continue to walk daily but focus on heart rate  Long: Continue to improve stamina. Short: Continue to get in exercise each day Long: Continue to improve stamina.   [place] Name 07/04/20 1309 07/18/20 1446 07/25/20 0733 08/02/20 1315       Exercise Goal Re-Evaluation   Exercise Goals Review  Increase Physical Activity;Increase Strength and Stamina Increase Physical Activity;Increase Strength and Stamina;Understanding of Exercise Prescription Increase Physical Activity;Increase Strength and Stamina;Understanding of Exercise Prescription Increase Physical Activity;Increase Strength and Stamina    Comments Butler is progressing well.  he goes up to 9.5% incline on the TM and level 5 on elliptical.  We will continue to monitor progress. Chigozie is about half way through the program and doing well.  He continues to do his intervals each day.  He is now up to 12 lb weights.  We will continue to monitor his progress. Earmon is doing well with exercising at home 3.25 miles per day (walking at a faster pace - 16 pace) - RPE 14. Plays basketball and golf. 200 push-ups, flutter kicks, curls, shoulder lifts, squats 4x/week - with 20lb weights. Bilily continues to progress well and does intervals on TM.  We will continue to monitor progress.    Expected Outcomes Short: continue consistent exercise Long:  increase MET level Short: Continue with intervals Long: Continue to improve stamina. Short: Continue with intervals in gym, continue exercising at home Long: Continue to improve stamina. Short: maintain current exercise schedule Long:  build overall stamina           Discharge Exercise Prescription (Final Exercise Prescription Changes):  Exercise Prescription Changes - 08/02/20 1300      Response to Exercise   Blood Pressure (Admit) 108/64    Blood Pressure (Exercise) 122/58    Blood Pressure (Exit) 104/60    Heart Rate (Admit) 87 bpm    Heart Rate (Exercise) 128 bpm    Heart Rate (Exit) 98 bpm    Rating of Perceived Exertion (Exercise) 14    Symptoms none    Duration Continue with 30 min of aerobic exercise without signs/symptoms of physical distress.    Intensity THRR unchanged      Progression   Progression Continue to progress workloads to maintain intensity without signs/symptoms of physical  distress.    Average METs 9.4      Interval Training   Interval Training Yes    Equipment Treadmill;Elliptical;REL-XR    Comments going up to 9% grade, interval program on elliptical      Treadmill   MPH 4.5    Grade 8    Minutes 30    METs 9.41           Nutrition:  Target Goals: Understanding of nutrition guidelines, daily intake of sodium '1500mg'$ , cholesterol '200mg'$ , calories 30% from fat and 7% or less from saturated fats, daily to have 5 or more servings of fruits and vegetables.  Education: All About Nutrition: -Group instruction provided by verbal, written material, interactive activities, discussions, models, and posters to present general guidelines for heart healthy nutrition including fat, fiber, MyPlate, the role of sodium in heart healthy nutrition, utilization of the nutrition label, and utilization of this knowledge for meal planning. Follow up email sent as well. Written material given at graduation. Flowsheet Row Cardiac Rehab from 08/03/2020 in [facility] Cardiac and Pulmonary Rehab  Education need identified 05/24/20  Date 07/06/20  Educator Pe[place]  Instruction Review Code 1- Verbalizes Understanding      Biometrics:  Pre Biometrics - 05/25/20 0931      Pre Biometrics   Height 5' 10.5" (1.791 m)    Weight 190 lb 3.2 oz (86.3 kg)    BMI (Calculated) 26.9    Single Leg Stand 28.8 seconds            Nutrition Therapy Plan and Nutrition Goals:  Nutrition Therapy & Goals - 05/25/20 0925      Nutrition Therapy   Diet Heart healthy, low Na    Drug/Food Interactions Statins/Certain Fruits    Protein (specify units) 70g    Fiber 30 grams    Whole Grain Foods 3 servings    Saturated Fats 12 max. grams    Fruits and Vegetables 8 servings/day    Sodium 1.5 grams      Personal Nutrition Goals   Nutrition Goal ST: check almond milk for fortification, reduce sodium using some of the techniques discussed (citrus, vinegar, etc), increase variety, try "nice" cream LT:  fiber 30g/day, 8 fruits and vegetables per day    Comments He reports limiting red meat, eating pork chops 1x/month, chicken and fish mostly. He reports eating whole grains and having a garden of his own vegetables he eats over the summer. he drinks almond milk or 2% milk. He likes oikos yogurt. For breakfast he will have oatmeal or grits with fruit and ground flax; on the weekned he will have bacon and eggs. L: sandwich ([place] or peanut butter and jelly) D: chicken, fish more during the summer. During the winter they eat more crockpot meals like chili. Vegetables: leafy greens, string beans, butter beans, peas. Fruit: bananas, apples, clementines, fruit medley. S: ice cream or some fruit. Discussed heart healhty eating. Recommended trying "nice" cream (made with frozen bananas).      Intervention Plan   Intervention Prescribe, educate and counsel regarding individualized specific dietary modifications aiming towards targeted core components such as weight, hypertension, lipid management, diabetes, heart failure and other comorbidities.;Nutrition handout(s) given to patient.    Expected Outcomes Short Term Goal: Understand basic principles of dietary content, such as calories, fat, sodium, cholesterol and nutrients.;Short Term Goal: A plan has been developed with personal nutrition goals set during dietitian appointment.;Long Term Goal: Adherence to prescribed nutrition plan.           Nutrition Assessments:  MEDIFICTS Score Key:  ?70 Need to make dietary changes   40-70 Heart Healthy Diet  ? 40 Therapeutic Level Cholesterol Diet  Flowsheet Row Cardiac Rehab from 05/25/2020 in [facility] Cardiac and Pulmonary Rehab  Picture Your Plate Total Score on Admission 83     Picture Your Plate Scores:  <30 Unhealthy dietary pattern with much room for improvement.  41-50 Dietary pattern unlikely to meet recommendations for good health and room for improvement.  51-60 More healthful dietary pattern,  with some room for improvement.   >60 Healthy dietary pattern, although there may be some specific behaviors that  could be improved.    Nutrition Goals Re-Evaluation:  Nutrition Goals Re-Evaluation    Row Name 06/29/20 0725 07/25/20 0738           Goals   Nutrition Goal ST: check almond milk for fortification, reduce sodium using some of the techniques discussed (citrus, vinegar, etc), increase variety, try "nice" cream LT: fiber 30g/day, 8 fruits and vegetables per day ST: Try carbohydrate snack before workout and carbohydrate/protein snack post workout. LT: continue with heart healthy changes      Comment Caleb is doing well with his diet.  He eats lots of fish and chicken.  He gets lots of vegetables.  He has not had a steak in over a month.  He is using almond milk some now with his cereal.  He is trying to watch his sodium more. He reports doing well with his nutrition. He likes chicken and fish, he likes pasta and potatoes. B: oatmeal with fruit and honey - sometimes he will have grits. He reports following MyPlate guidelines when eating and referring to his handouts given by RD. Continue to increase variety. Try carbohydrate snack before workout and carbohydrate/protein snack post workout.      Expected Outcome Short: Continue to watch sodium Long; Continue to eat healthier Short: Continue to watch sodium Long; Continue to eat healthier             Nutrition Goals Discharge (Final Nutrition Goals Re-Evaluation):  Nutrition Goals Re-Evaluation - 07/25/20 0738      Goals   Nutrition Goal ST: Try carbohydrate snack before workout and carbohydrate/protein snack post workout. LT: continue with heart healthy changes    Comment He reports doing well with his nutrition. He likes chicken and fish, he likes pasta and potatoes. B: oatmeal with fruit and honey - sometimes he will have grits. He reports following MyPlate guidelines when eating and referring to his handouts given by RD. Continue  to increase variety. Try carbohydrate snack before workout and carbohydrate/protein snack post workout.    Expected Outcome Short: Continue to watch sodium Long; Continue to eat healthier           Psychosocial: Target Goals: Acknowledge presence or absence of significant depression and/or stress, maximize coping skills, provide positive support system. Participant is able to verbalize types and ability to use techniques and skills needed for reducing stress and depression.   Education: Stress, Anxiety, and Depression - Group verbal and visual presentation to define topics covered.  Reviews how body is impacted by stress, anxiety, and depression.  Also discusses healthy ways to reduce stress and to treat/manage anxiety and depression.  Written material given at graduation. Flowsheet Row Cardiac Rehab from 08/03/2020 in [facility] Cardiac and Pulmonary Rehab  Education need identified 05/24/20  Date 08/03/20  Educator [facility]  Instruction Review Code 1- [employer] Understanding      Education: Sleep Hygiene -Provides group verbal and written instruction about how sleep can affect your health.  Define sleep hygiene, discuss sleep cycles and impact of sleep habits. Review good sleep hygiene tips.    Initial Review & Psychosocial Screening:  Initial Psych Review & Screening - 05/18/20 1013      Initial Review   Current issues with None Identified      Family Dynamics   Good Support System? Yes    Comments He can look to his son and his wife for support. He has a good outlook on his health after his procedure and is ready to start exercising.  Barriers   Psychosocial barriers to participate in program There are no identifiable barriers or psychosocial needs.      Screening Interventions   Interventions To provide support and resources with identified psychosocial needs;Provide feedback about the scores to participant    Expected Outcomes Short Term goal: Utilizing psychosocial counselor,  staff and physician to assist with identification of specific Stressors or current issues interfering with healing process. Setting desired goal for each stressor or current issue identified.;Long Term Goal: Stressors or current issues are controlled or eliminated.;Short Term goal: Identification and review with participant of any Quality of Life or Depression concerns found by scoring the questionnaire.;Long Term goal: The participant improves quality of Life and PHQ9 Scores as seen by post scores and/or verbalization of changes           Quality of Life Scores:   Quality of Life - 05/24/20 1313      Quality of Life   Select Quality of Life      Quality of Life Scores   Health/Function Pre 25.07 %    Socioeconomic Pre 26.25 %    Psych/Spiritual Pre 28.07 %    Family Pre 28.8 %    GLOBAL Pre 26.47 %          Scores of 19 and below usually indicate a poorer quality of life in these areas.  A difference of  2-3 points is a clinically meaningful difference.  A difference of 2-3 points in the total score of the Quality of Life Index has been associated with significant improvement in overall quality of life, self-image, physical symptoms, and general health in studies assessing change in quality of life.  PHQ-9: Recent Review Flowsheet Data    Depression screen [facility] 2/9 07/25/2020 05/25/2020 04/06/2019 07/30/2018 06/29/2018   Decreased Interest 0 0 0 0 0   Down, Depressed, Hopeless 0 0 0 0 0   PHQ - 2 Score 0 0 0 0 0   Altered sleeping 0 1 0 - -   Tired, decreased energy 0 0 0 - -   Change in appetite 0 0 0 - -   Feeling bad or failure about yourself  0 0 0 - -   Trouble concentrating 0 0 0 - -   Moving slowly or fidgety/restless 0 0 0 - -   Suicidal thoughts 0 0 0 - -   PHQ-9 Score 0 1 0 - -   Difficult doing work/chores Not difficult at all Not difficult at all Not difficult at all - -     Interpretation of Total Score  Total Score Depression Severity:  1-4 = Minimal depression, 5-9  = Mild depression, 10-14 = Moderate depression, 15-19 = Moderately severe depression, 20-27 = Severe depression   Psychosocial Evaluation and Intervention:  Psychosocial Evaluation - 05/18/20 1014      Psychosocial Evaluation & Interventions   Interventions Encouraged to exercise with the program and follow exercise prescription;Relaxation education;Stress management education    Comments He can look to his son and his wife for support. He has a good outlook on his health after his procedure and is ready to start exercising.    Expected Outcomes Short: Exercise regularly to support mental health and notify staff of any changes. Long: maintain mental health and well being through teaching of rehab or prescribed medications independently.    Continue Psychosocial Services  Follow up required by staff           Psychosocial Re-Evaluation:  Psychosocial Re-Evaluation  [place] Name 06/29/20 5883 07/25/20 0736           Psychosocial Re-Evaluation   Current issues with None Identified --      Comments Jovanny is doing well in rehab. He feels good mentally and declines any major stressors.  He sleeps well most days.  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 Alphons is doing well in rehab. He feels good mentally and declines any major stressors. He sleeps well most days. He plays golf and basketball - he also likes reading. 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      Expected Outcomes Short: Continue to make time for exercise Long: Continue to stay positive. Short: Continue stress management Long: Continue to stay positive.      Interventions Encouraged to attend Cardiac Rehabilitation for the exercise Encouraged to attend Cardiac Rehabilitation for the exercise      Continue Psychosocial Services  Follow up required by staff Follow up required by staff              Psychosocial Discharge (Final Psychosocial Re-Evaluation):  Psychosocial Re-Evaluation - 07/25/20 0736      Psychosocial Re-Evaluation   Comments Viral is doing well in rehab. He feels good mentally and declines any major stressors. He sleeps well most days. He plays golf and basketball - he also likes reading. 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    Expected Outcomes Short: Continue stress management Long: Continue to stay positive.    Interventions Encouraged to attend Cardiac Rehabilitation for the exercise    Continue Psychosocial Services  Follow up required by staff           Vocational Rehabilitation: Provide vocational rehab assistance to qualifying candidates.   Vocational Rehab Evaluation & Intervention:   Education: Education Goals: Education classes will be provided on a variety of topics geared toward better understanding of heart health and risk factor modification. Participant will state understanding/return demonstration of topics presented as noted by education test scores.  Learning Barriers/Preferences:  Learning Barriers/Preferences - 05/18/20 1009      Learning Barriers/Preferences   Learning Barriers None    Learning Preferences None           General Cardiac Education Topics:  AED/CPR: - Group verbal and written instruction with the use of models to demonstrate the basic use of the AED with the basic ABC's of resuscitation.   Anatomy and Cardiac Procedures: - Group verbal and visual presentation and models provide information about basic cardiac anatomy and function. Reviews the testing methods done to diagnose heart disease and the outcomes of the test results. Describes the treatment choices: Medical Management, Angioplasty, or Coronary Bypass Surgery for treating various heart conditions including Myocardial Infarction, Angina, Valve Disease, and Cardiac  Arrhythmias.  Written material given at graduation. Flowsheet Row Cardiac Rehab from 08/03/2020 in [facility] Cardiac and Pulmonary Rehab  Date 06/22/20  Educator SB  Instruction Review Code 1- Verbalizes Understanding      Medication Safety: - Group verbal and visual instruction to review commonly prescribed medications for heart and lung disease. Reviews the medication, class of the drug, and side effects. Includes the steps to properly store meds and maintain the prescription  regimen.  Written material given at graduation. Flowsheet Row Cardiac Rehab from 08/03/2020 in [facility] Cardiac and Pulmonary Rehab  Date 07/13/20  Educator SB  Instruction Review Code 1- Verbalizes Understanding      Intimacy: - Group verbal instruction through game format to discuss how heart and lung disease can affect sexual intimacy. Written material given at graduation.. Flowsheet Row Cardiac Rehab from 08/03/2020 in [facility] Cardiac and Pulmonary Rehab  Date 06/15/20  Educator jh  Instruction Review Code 1- Verbalizes Understanding      Know Your Numbers and Heart Failure: - Group verbal and visual instruction to discuss disease risk factors for cardiac and pulmonary disease and treatment options.  Reviews associated critical values for Overweight/Obesity, Hypertension, Cholesterol, and Diabetes.  Discusses basics of heart failure: signs/symptoms and treatments.  Introduces Heart Failure Zone chart for action plan for heart failure.  Written material given at graduation.   Infection Prevention: - Provides verbal and written material to individual with discussion of infection control including proper hand washing and proper equipment cleaning during exercise session. Flowsheet Row Cardiac Rehab from 08/03/2020 in [facility] Cardiac and Pulmonary Rehab  Date 05/18/20  Educator [facility]  Instruction Review Code 1- Verbalizes Understanding      Falls Prevention: - Provides verbal and written material to individual with discussion of  falls prevention and safety. Flowsheet Row Cardiac Rehab from 08/03/2020 in [facility] Cardiac and Pulmonary Rehab  Date 05/18/20  Educator [facility]  Instruction Review Code 1- Verbalizes Understanding      Other: -Provides group and verbal instruction on various topics (see comments)   Knowledge Questionnaire Score:  Knowledge Questionnaire Score - 05/24/20 1313      Knowledge Questionnaire Score   Pre Score 23/26 Education Focus: Depression, nutrition           Core Components/Risk Factors/Patient Goals at Admission:  Personal Goals and Risk Factors at Admission - 05/25/20 0932      Core Components/Risk Factors/Patient Goals on Admission    Weight Management Yes;Weight Loss    Intervention Weight Management: Develop a combined nutrition and exercise program designed to reach desired caloric intake, while maintaining appropriate intake of nutrient and fiber, sodium and fats, and appropriate energy expenditure required for the weight goal.;Weight Management: Provide education and appropriate resources to help participant work on and attain dietary goals.;Weight Management/Obesity: Establish reasonable short term and long term weight goals.    Admit Weight 190 lb 3.2 oz (86.3 kg)    Goal Weight: Short Term 185 lb (83.9 kg)    Goal Weight: Long Term 185 lb (83.9 kg)    Expected Outcomes Short Term: Continue to assess and modify interventions until short term weight is achieved;Long Term: Adherence to nutrition and physical activity/exercise program aimed toward attainment of established weight goal;Understanding recommendations for meals to include 15-35% energy as protein, 25-35% energy from fat, 35-60% energy from carbohydrates, less than $RemoveB'200mg'ueLEriLX$  of dietary cholesterol, 20-35 gm of total fiber daily;Understanding of distribution of calorie intake throughout the day with the consumption of 4-5 meals/snacks;Weight Loss: Understanding of general recommendations for a balanced deficit meal plan, which  promotes 1-2 lb weight loss per week and includes a negative energy balance of [phone] kcal/d;Weight Maintenance: Understanding of the daily nutrition guidelines, which includes 25-35% calories from fat, 7% or less cal from saturated fats, less than $RemoveB'200mg'sktKSUZM$  cholesterol, less than 1.5gm of sodium, & 5 or more servings of fruits and vegetables daily    Hypertension Yes    Intervention Provide education on lifestyle  modifcations including regular physical activity/exercise, weight management, moderate sodium restriction and increased consumption of fresh fruit, vegetables, and low fat dairy, alcohol moderation, and smoking cessation.;Monitor prescription use compliance.    Expected Outcomes Short Term: Continued assessment and intervention until BP is < 140/28mm HG in hypertensive participants. < 130/43mm HG in hypertensive participants with diabetes, heart failure or chronic kidney disease.;Long Term: Maintenance of blood pressure at goal levels.    Lipids Yes    Intervention Provide education and support for participant on nutrition & aerobic/resistive exercise along with prescribed medications to achieve LDL '70mg'$ , HDL >$Remo'40mg'yJXgU$ .    Expected Outcomes Short Term: Participant states understanding of desired cholesterol values and is compliant with medications prescribed. Participant is following exercise prescription and nutrition guidelines.;Long Term: Cholesterol controlled with medications as prescribed, with individualized exercise RX and with personalized nutrition plan. Value goals: LDL < $Rem'70mg'Hmns$ , HDL > 40 mg.           Education:Diabetes - Individual verbal and written instruction to review signs/symptoms of diabetes, desired ranges of glucose level fasting, after meals and with exercise. Acknowledge that pre and post exercise glucose checks will be done for 3 sessions at entry of program.   Core Components/Risk Factors/Patient Goals Review:   Goals and Risk Factor Review    Row Name 06/29/20 0726  07/25/20 0741           Core Components/Risk Factors/Patient Goals Review   Personal Goals Review Weight Management/Obesity;Hypertension Weight Management/Obesity;Hypertension;Improve shortness of breath with ADL's      Review Romyn is doing well in rehab.  His weight was 188 lb today.  He tries to keep it around 185 lb at home.  Blood pressures have been good in class and he keeps an eye on them at home.  He is pleased with where things are currently. Shelton is doing well in rehab.  His weight was 189 lb today. He tries to keep it around 185 lb at home - he tries to saty between 185 and 190lbs. He checks his BP at home (<115/<75 in am,130/75-80 in pm)  this morning 108/64. He reports doctors took him off all BP medication December 2nd. He reports he would also like to manage his shortness of breath recovery.      Expected Outcomes Short: Continue to maintain weight Long: Continue to monitor risk factors Short: Continue to maintain weight Long: Continue to monitor risk factors             Core Components/Risk Factors/Patient Goals at Discharge (Final Review):   Goals and Risk Factor Review - 07/25/20 0741      Core Components/Risk Factors/Patient Goals Review   Personal Goals Review Weight Management/Obesity;Hypertension;Improve shortness of breath with ADL's    Review Leander is doing well in rehab.  His weight was 189 lb today. He tries to keep it around 185 lb at home - he tries to saty between 185 and 190lbs. He checks his BP at home (<115/<75 in am,130/75-80 in pm)  this morning 108/64. He reports doctors took him off all BP medication December 2nd. He reports he would also like to manage his shortness of breath recovery.    Expected Outcomes Short: Continue to maintain weight Long: Continue to monitor risk factors           ITP Comments:  ITP Comments    Row Name 05/18/20 1009 05/25/20 0926 05/30/20 0807 06/21/20 0610 07/19/20 0713   ITP Comments Virtual Visit completed. Patient  informed on EP and  RD appointment and 6 Minute walk test. Patient also informed of patient health questionnaires on My Chart. Patient Verbalizes understanding. Visit diagnosis can be found in [facility] 05/01/2020. Completed 6MWT and gym orientation. Initial ITP created and sent for review to Dr. Emily Filbert, Medical Director. First full day of exercise!  Patient was oriented to gym and equipment including functions, settings, policies, and procedures.  Patient's individual exercise prescription and treatment plan were reviewed.  All starting workloads were established based on the results of the 6 minute walk test done at initial orientation visit.  The plan for exercise progression was also introduced and progression will be customized based on patient's performance and goals. 30 Day review completed. Medical Director ITP review done, changes made as directed, and signed approval by Medical Director. 30 Day review completed. Medical Director ITP review done, changes made as directed, and signed approval by Medical Director.   [place] Name 08/16/20 0904           ITP Comments 30 Day review completed. Medical Director ITP review done, changes made as directed, and signed approval by Medical Director.              Comments:

## 2020-08-17 ENCOUNTER — Other Ambulatory Visit: Payer: Self-pay

## 2020-08-17 DIAGNOSIS — Z8679 Personal history of other diseases of the circulatory system: Secondary | ICD-10-CM

## 2020-08-17 DIAGNOSIS — Z9889 Other specified postprocedural states: Secondary | ICD-10-CM | POA: Diagnosis not present

## 2020-08-17 NOTE — Progress Notes (Signed)
Daily Session Note  Patient Details  Name: Jorge [name] MRN: [id number] Date of Birth: 01/14/1966 Referring Provider:   Flowsheet Row Cardiac Rehab from 05/25/2020 in [facility] Cardiac and Pulmonary Rehab  Referring Provider Thompson Grayer MD      Encounter Date: 08/17/2020  Check In:  Session Check In - 08/17/20 0721      Check-In   Supervising physician immediately available to respond to emergencies See telemetry face sheet for immediately available ER MD    Location [facility]-Cardiac & Pulmonary Rehab    Staff Present Birdie Sons, MPA, Mauricia Area, BS, ACSM CEP, Exercise Physiologist;Amanda Oletta Darter, BA, ACSM CEP, Exercise Physiologist    Virtual Visit No    Medication changes reported     No    Fall or balance concerns reported    No    Warm-up and Cool-down Performed on first and last piece of equipment    Resistance Training Performed Yes    VAD Patient? No    PAD/SET Patient? No      Pain Assessment   Currently in Pain? No/denies              Social History   Tobacco Use  Smoking Status Never Smoker  Smokeless Tobacco Never Used    Goals Met:  Independence with exercise equipment Exercise tolerated well No report of cardiac concerns or symptoms Strength training completed today  Goals Unmet:  Not Applicable  Comments: Pt able to follow exercise prescription today without complaint.  Will continue to monitor for progression.    Dr. Emily Filbert is Medical Director for [place] and [name].

## 2020-08-22 ENCOUNTER — Other Ambulatory Visit: Payer: Self-pay

## 2020-08-22 DIAGNOSIS — Z9889 Other specified postprocedural states: Secondary | ICD-10-CM | POA: Diagnosis not present

## 2020-08-22 DIAGNOSIS — Z8679 Personal history of other diseases of the circulatory system: Secondary | ICD-10-CM

## 2020-08-22 NOTE — Progress Notes (Signed)
Daily Session Note  Patient Details  Name: CHUN SELLEN MRN: [id number] Date of Birth: 12-Jul-1965 Referring Provider:   Flowsheet Row Cardiac Rehab from 05/25/2020 in [facility] Cardiac and Pulmonary Rehab  Referring Provider Thompson Grayer MD      Encounter Date: 08/22/2020  Check In:  Session Check In - 08/22/20 0719      Check-In   Supervising physician immediately available to respond to emergencies See telemetry face sheet for immediately available ER MD    Location [facility]-Cardiac & Pulmonary Rehab    Staff Present Birdie Sons, MPA, Elveria Rising, BA, ACSM CEP, Exercise Physiologist;Kara Eliezer Bottom, MS Exercise Physiologist    Virtual Visit No    Medication changes reported     No    Fall or balance concerns reported    No    Warm-up and Cool-down Performed on first and last piece of equipment    Resistance Training Performed Yes    VAD Patient? No    PAD/SET Patient? No      Pain Assessment   Currently in Pain? No/denies              Social History   Tobacco Use  Smoking Status Never Smoker  Smokeless Tobacco Never Used    Goals Met:  Independence with exercise equipment Exercise tolerated well No report of cardiac concerns or symptoms Strength training completed today  Goals Unmet:  Not Applicable  Comments: Pt able to follow exercise prescription today without complaint.  Will continue to monitor for progression.    Dr. Emily Filbert is Medical Director for [place] and [name].

## 2020-08-29 ENCOUNTER — Encounter: Payer: BC Managed Care – PPO | Attending: Internal Medicine

## 2020-08-29 ENCOUNTER — Other Ambulatory Visit: Payer: Self-pay

## 2020-08-29 DIAGNOSIS — Z8679 Personal history of other diseases of the circulatory system: Secondary | ICD-10-CM | POA: Diagnosis present

## 2020-08-29 DIAGNOSIS — Z9889 Other specified postprocedural states: Secondary | ICD-10-CM | POA: Insufficient documentation

## 2020-08-29 NOTE — Progress Notes (Signed)
Daily Session Note  Patient Details  Name: Jorge [name] MRN: [id number] Date of Birth: [birth year] Referring Provider:   Flowsheet Row Cardiac Rehab from 05/25/2020 in [facility] Cardiac and Pulmonary Rehab  Referring Provider Thompson Grayer MD      Encounter Date: 08/29/2020  Check In:  Session Check In - 08/29/20 0720      Check-In   Supervising physician immediately available to respond to emergencies See telemetry face sheet for immediately available ER MD    Location [facility]-Cardiac & Pulmonary Rehab    Staff Present Birdie Sons, MPA, Elveria Rising, BA, ACSM CEP, Exercise Physiologist;Kara Eliezer Bottom, MS Exercise Physiologist    Virtual Visit No    Medication changes reported     No    Fall or balance concerns reported    No    Warm-up and Cool-down Performed on first and last piece of equipment    Resistance Training Performed Yes    VAD Patient? No    PAD/SET Patient? No      Pain Assessment   Currently in Pain? No/denies              Social History   Tobacco Use  Smoking Status Never Smoker  Smokeless Tobacco Never Used    Goals Met:  Independence with exercise equipment Exercise tolerated well Personal goals reviewed No report of cardiac concerns or symptoms Strength training completed today  Goals Unmet:  Not Applicable  Comments: Pt able to follow exercise prescription today without complaint.  Will continue to monitor for progression.    Dr. Emily Filbert is Medical Director for [place] and [name].

## 2020-08-29 NOTE — Progress Notes (Signed)
Carelink Summary Report / Loop Recorder 

## 2020-09-05 ENCOUNTER — Other Ambulatory Visit: Payer: Self-pay

## 2020-09-05 DIAGNOSIS — Z9889 Other specified postprocedural states: Secondary | ICD-10-CM

## 2020-09-05 DIAGNOSIS — Z8679 Personal history of other diseases of the circulatory system: Secondary | ICD-10-CM

## 2020-09-05 NOTE — Progress Notes (Signed)
Daily Session Note  Patient Details  Name: Jorge [name] MRN: [id number] Date of Birth: [birth year] Referring Provider:   Flowsheet Row Cardiac Rehab from 05/25/2020 in [facility] Cardiac and Pulmonary Rehab  Referring Provider Thompson Grayer MD      Encounter Date: 09/05/2020  Check In:  Session Check In - 09/05/20 0715      Check-In   Supervising physician immediately available to respond to emergencies See telemetry face sheet for immediately available ER MD    Location [facility]-Cardiac & Pulmonary Rehab    Staff Present Birdie Sons, MPA, Elveria Rising, BA, ACSM CEP, Exercise Physiologist;Kara Eliezer Bottom, MS Exercise Physiologist    Virtual Visit No    Medication changes reported     No    Fall or balance concerns reported    No    Warm-up and Cool-down Performed on first and last piece of equipment    Resistance Training Performed Yes    VAD Patient? No    PAD/SET Patient? No      Pain Assessment   Currently in Pain? No/denies              Social History   Tobacco Use  Smoking Status Never Smoker  Smokeless Tobacco Never Used    Goals Met:  Independence with exercise equipment Exercise tolerated well No report of cardiac concerns or symptoms Strength training completed today  Goals Unmet:  Not Applicable  Comments: Pt able to follow exercise prescription today without complaint.  Will continue to monitor for progression.    Dr. Emily Filbert is Medical Director for [place] and [name].

## 2020-09-07 ENCOUNTER — Other Ambulatory Visit: Payer: Self-pay

## 2020-09-07 DIAGNOSIS — Z8679 Personal history of other diseases of the circulatory system: Secondary | ICD-10-CM

## 2020-09-07 DIAGNOSIS — Z9889 Other specified postprocedural states: Secondary | ICD-10-CM

## 2020-09-07 NOTE — Progress Notes (Signed)
Daily Session Note  Patient Details  Name: KVON MCILHENNY MRN: [id number] Date of Birth: 04-17-1966 Referring Provider:   Flowsheet Row Cardiac Rehab from 05/25/2020 in [facility] Cardiac and Pulmonary Rehab  Referring Provider Thompson Grayer MD      Encounter Date: 09/07/2020  Check In:  Session Check In - 09/07/20 0707      Check-In   Supervising physician immediately available to respond to emergencies See telemetry face sheet for immediately available ER MD    Location [facility]-Cardiac & Pulmonary Rehab    Staff Present Birdie Sons, MPA, Mauricia Area, BS, ACSM CEP, Exercise Physiologist;Amanda Oletta Darter, BA, ACSM CEP, Exercise Physiologist    Virtual Visit No    Medication changes reported     No    Fall or balance concerns reported    No    Warm-up and Cool-down Performed on first and last piece of equipment    Resistance Training Performed Yes    VAD Patient? No    PAD/SET Patient? No      Pain Assessment   Currently in Pain? No/denies              Social History   Tobacco Use  Smoking Status Never Smoker  Smokeless Tobacco Never Used    Goals Met:  Independence with exercise equipment Exercise tolerated well No report of cardiac concerns or symptoms Strength training completed today  Goals Unmet:  Not Applicable  Comments: Pt able to follow exercise prescription today without complaint.  Will continue to monitor for progression.    Dr. Emily Filbert is Medical Director for [place] and [name].

## 2020-09-08 ENCOUNTER — Ambulatory Visit (INDEPENDENT_AMBULATORY_CARE_PROVIDER_SITE_OTHER): Payer: BC Managed Care – PPO

## 2020-09-08 DIAGNOSIS — I4819 Other persistent atrial fibrillation: Secondary | ICD-10-CM

## 2020-09-08 LAB — CUP PACEART REMOTE DEVICE CHECK
Date Time Interrogation Session: 20220513064213
Implantable Pulse Generator Implant Date: 20210512

## 2020-09-13 ENCOUNTER — Encounter: Payer: Self-pay | Admitting: *Deleted

## 2020-09-13 DIAGNOSIS — Z8679 Personal history of other diseases of the circulatory system: Secondary | ICD-10-CM

## 2020-09-13 NOTE — Progress Notes (Signed)
Cardiac Individual Treatment Plan  Patient Details  Name: Jorge [name] MRN: [id number] Date of Birth: [birth year] Referring Provider:   Flowsheet Row Cardiac Rehab from 05/25/2020 in [facility] Cardiac and Pulmonary Rehab  Referring Provider Thompson Grayer MD      Initial Encounter Date:  Flowsheet Row Cardiac Rehab from 05/25/2020 in [facility] Cardiac and Pulmonary Rehab  Date 05/25/20      Visit Diagnosis: Status post Maze operation for atrial fibrillation  Patient's Home Medications on Admission:  Current Outpatient Medications:  .  aspirin 81 MG EC tablet, Take 81 mg by mouth daily. , Disp: , Rfl:  .  aspirin-acetaminophen-caffeine (EXCEDRIN MIGRAINE) 250-250-65 MG tablet, Take 2 tablets by mouth every 6 (six) hours as needed for headache. , Disp: , Rfl:  .  azelastine (ASTELIN) 0.1 % nasal spray, Place 1 spray into both nostrils 2 (two) times daily., Disp: 30 mL, Rfl: 12 .  ferrous [id number] C-folic acid (TRINSICON / FOLTRIN) capsule, Take 1 capsule by mouth 2 (two) times daily after a meal., Disp: 60 capsule, Rfl: 1 .  fluticasone (FLONASE) 50 MCG/ACT nasal spray, Place 2 sprays into both nostrils daily., Disp: 16 g, Rfl: 3 .  ibuprofen (ADVIL) 200 MG tablet, Take 800 mg by mouth every 6 (six) hours as needed for headache or moderate pain., Disp: , Rfl:  .  montelukast (SINGULAIR) 10 MG tablet, Take 1 tablet (10 mg total) by mouth at bedtime., Disp: 90 tablet, Rfl: 3 .  rivaroxaban (XARELTO) 20 MG TABS tablet, As needed if afib returns. Once daily with supper. Call and let us know if you need to use it., Disp: 30 tablet, Rfl: 0 .  rosuvastatin (CRESTOR) 40 MG tablet, Take 1 tablet (40 mg total) by mouth daily., Disp: 90 tablet, Rfl: 3 .  tamsulosin (FLOMAX) 0.4 MG CAPS capsule, Take 1 capsule (0.4 mg total) by mouth daily after supper., Disp: 30 capsule, Rfl: 3 .  traMADol (ULTRAM) 50 MG tablet, Take 1-2 tablets (50-100 mg total) by mouth every 4 (four) hours as needed for  moderate pain., Disp: 28 tablet, Rfl: 0  Past Medical History: Past Medical History:  Diagnosis Date  . Allergic rhinitis, seasonal   . Anticoagulant long-term use managed by cardiology   asa and xarelto  . Bilateral nephrolithiasis    urologist--- dr Junious Silk ([place] office)  . Coronary artery disease cardiologist--- dr Angelena Form   nuclear stress test 10-18-2016 w/ normal perfusion;  cardiac cath 12-23-2017, PCI w/ DES to midLAD and mild RCA/ moderate CFx diease nonobstructive , normal LVF;  ETT 07-13-2019 negative for ischemia  . Dysrhythmia    Afib  . History of kidney stones   . Hyperlipidemia   . Hypertension    followed by pcp and cardiology  . OA (osteoarthritis)    knees  . OSA on CPAP    12-31-2019  per pt uses cpap every night  . PAF (paroxysmal atrial fibrillation) ([place]) 2011   followed by dr Rayann Heman (ep)---  first dx 2011;  s/p DCCV's and ablation x2 in 2015 and last one 03/ 2018, and s/p loop recorder 05/ 2021;   failed multaq, flecainide, and amiodarone  . Pneumonia   . Right knee meniscal tear   . S/P ablation of atrial fibrillation followed by dr allred   x3  last one 2018  . S/P drug eluting coronary stent placement 12/23/2017   x1  to midLAD  . S/P minimally-invasive maze operation for atrial fibrillation 03/30/2020   Complete bilateral  atrial lesion set using cryothermy and bipolar radiofrequency ablation with clipping of LA appendage via right mini thoracotomy approach  . SOB (shortness of breath)    12-31-2019  per pt only when he has atrial fib , otherwise no issues with sob with activities  . Status post placement of implantable loop recorder 09/08/2019   followed by dr allred    Tobacco Use: Social History   Tobacco Use  Smoking Status Never Smoker  Smokeless Tobacco Never Used    Labs: Recent Review Flowsheet Data    Labs for ITP Cardiac and Pulmonary Rehab Latest Ref Rng & Units 03/30/2020 03/30/2020 03/30/2020 03/30/2020 07/25/2020   Cholestrol  100 - 199 mg/dL - - - - 151   LDLCALC 0 - 99 mg/dL - - - - 79   HDL >39 mg/dL - - - - 59   Trlycerides 0 - 149 mg/dL - - - - 68   Hemoglobin A1c 4.8 - 5.6 % - - - - -   PHART 7.350 - 7.450 - 7.328(L) 7.267(L) 7.358 -   PCO2ART 32.0 - 48.0 mmHg - 45.9 51.5(H) 34.4 -   HCO3 20.0 - 28.0 mmol/L - 24.2 23.6 19.3(L) -   TCO2 22 - 32 mmol/L _0 20(L) -   ACIDBASEDEF 0.0 - 2.0 mmol/L - 2.0 4.0(H) 6.0(H) -   O2SAT % - 99.0 92.0 90.0 -       Exercise Target Goals: Exercise Program Goal: Individual exercise prescription set using results from initial 6 min walk test and THRR while considering  patient's activity barriers and safety.   Exercise Prescription Goal: Initial exercise prescription builds to 30-45 minutes a day of aerobic activity, 2-3 days per week.  Home exercise guidelines will be given to patient during program as part of exercise prescription that the participant will acknowledge.   Education: Aerobic Exercise: - Group verbal and visual presentation on the components of exercise prescription. Introduces F.I.T.T principle from ACSM for exercise prescriptions.  Reviews F.I.T.T. principles of aerobic exercise including progression. Written material given at graduation. Flowsheet Row Cardiac Rehab from 08/03/2020 in [facility] Cardiac and Pulmonary Rehab  Date 06/15/20  Educator jh  Instruction Review Code 1- Verbalizes Understanding      Education: Resistance Exercise: - Group verbal and visual presentation on the components of exercise prescription. Introduces F.I.T.T principle from ACSM for exercise prescriptions  Reviews F.I.T.T. principles of resistance exercise including progression. Written material given at graduation. Flowsheet Row Cardiac Rehab from 08/03/2020 in [facility] Cardiac and Pulmonary Rehab  Date 06/22/20  Educator [facility]  Instruction Review Code 1- Verbalizes Understanding       Education: Exercise & Equipment Safety: - Individual verbal instruction and demonstration  of equipment use and safety with use of the equipment. Flowsheet Row Cardiac Rehab from 08/03/2020 in [facility] Cardiac and Pulmonary Rehab  Date 05/18/20  Educator [facility]  Instruction Review Code 1- Verbalizes Understanding      Education: Exercise Physiology & General Exercise Guidelines: - Group verbal and written instruction with models to review the exercise physiology of the cardiovascular system and associated critical values. Provides general exercise guidelines with specific guidelines to those with heart or lung disease.    Education: Flexibility, Balance, Mind/Body Relaxation: - Group verbal and visual presentation with interactive activity on the components of exercise prescription. Introduces F.I.T.T principle from ACSM for exercise prescriptions. Reviews F.I.T.T. principles of flexibility and balance exercise training including progression. Also discusses the mind body connection.  Reviews various relaxation techniques to  help reduce and manage stress (i.e. Deep breathing, progressive muscle relaxation, and visualization). Balance handout provided to take home. Written material given at graduation.   Activity Barriers & Risk Stratification:  Activity Barriers & Cardiac Risk Stratification - 05/25/20 0928      Activity Barriers & Cardiac Risk Stratification   Activity Barriers Joint Problems;Shortness of Breath;Muscular Weakness;Other (comment);Incisional Pain    Comments bilateral knee surgery (most recent meniscus tear in 9/20200, incisional soreness, weakness in chest from incision    Cardiac Risk Stratification Moderate           6 Minute Walk:  6 Minute Walk    Row Name 05/25/20 0926         6 Minute Walk   Phase Initial     Distance 1515 feet     Walk Time 6 minutes     # of Rest Breaks 0     MPH 2.87     METS 4.34     RPE 10     Perceived Dyspnea  1.5     VO2 Peak 15.19     Symptoms Yes (comment)     Comments incisional soreness 2/10     Resting HR 93 bpm      Resting BP 122/64     Resting Oxygen Saturation  98 %     Exercise Oxygen Saturation  during 6 min walk 96 %     Max Ex. HR 108 bpm     Max Ex. BP 134/64     2 Minute Post BP 108/60            Oxygen Initial Assessment:   Oxygen Re-Evaluation:   Oxygen Discharge (Final Oxygen Re-Evaluation):   Initial Exercise Prescription:  Initial Exercise Prescription - 05/25/20 0900      Date of Initial Exercise RX and Referring Provider   Date 05/25/20    Referring Provider Thompson Grayer MD      Treadmill   MPH 2.8    Grade 2    Minutes 15    METs 3.92      Recumbant Elliptical   Level 2    RPM 50    Minutes 15    METs 3      Elliptical   Level 1    Speed 4.4    Minutes 15      Prescription Details   Frequency (times per week) 2    Duration Progress to 30 minutes of continuous aerobic without signs/symptoms of physical distress      Intensity   THRR 40-80% of Max Heartrate 122-151    Ratings of Perceived Exertion 11-13    Perceived Dyspnea 0-4      Progression   Progression Continue to progress workloads to maintain intensity without signs/symptoms of physical distress.      Resistance Training   Training Prescription Yes    Weight 6 lb    Reps 10-15           Perform Capillary Blood Glucose checks as needed.  Exercise Prescription Changes:  Exercise Prescription Changes    Row Name 05/25/20 0900 06/05/20 1700 06/21/20 0800 06/22/20 0700 07/04/20 1300     Response to Exercise   Blood Pressure (Admit) 122/64 132/82 126/78 -- 122/64   Blood Pressure (Exercise) 134/64 122/66 136/72 -- 138/74   Blood Pressure (Exit) 108/60 112/62 116/66 -- 122/62   Heart Rate (Admit) 93 bpm 94 bpm 91 bpm -- 62 bpm   Heart Rate (Exercise) 108 bpm  120 bpm 114 bpm -- 121 bpm   Heart Rate (Exit) 96 bpm 105 bpm 102 bpm -- 101 bpm   Oxygen Saturation (Admit) 97 % -- -- -- --   Oxygen Saturation (Exercise) 96 % -- -- -- --   Rating of Perceived Exertion (Exercise) _0 --  15   Perceived Dyspnea (Exercise) 1.5 -- -- -- --   Symptoms incisional soreness 2/10 -- none -- none   Comments walk test results second day -- -- --   Duration -- -- Continue with 30 min of aerobic exercise without signs/symptoms of physical distress. -- Continue with 30 min of aerobic exercise without signs/symptoms of physical distress.   Intensity -- -- THRR unchanged -- THRR unchanged     Progression   Progression -- Continue to progress workloads to maintain intensity without signs/symptoms of physical distress. Continue to progress workloads to maintain intensity without signs/symptoms of physical distress. -- Continue to progress workloads to maintain intensity without signs/symptoms of physical distress.   Average METs -- 4 5.55 -- 5.3     Resistance Training   Training Prescription -- Yes Yes -- Yes   Weight -- 6 lb 10 lb -- 10 lb   Reps -- 10-15 10-15 -- 10-15     Interval Training   Interval Training -- -- Yes -- Yes   Equipment -- -- Treadmill -- Treadmill   Comments -- -- 3%-8% grade 1 min -- 3%-8% grade 1 min     Treadmill   MPH -- 3.5 3.5 -- 3.5   Grade -- 2 8 -- 9.5   Minutes -- 15 15 -- 15   METs -- 4.64 7.5 -- --     Recumbant Elliptical   Level -- -- 3 -- --   Minutes -- -- 15 -- --   METs -- -- 3.6 -- --     Elliptical   Level -- -- 2 -- 5   Speed -- -- 4.4 -- --   Minutes -- -- 15 -- 15     Home Exercise Plan   Plans to continue exercise at -- -- -- Home (comment)  walking, elliptical Home (comment)  walking, elliptical   Frequency -- -- -- Add 3 additional days to program exercise sessions. Add 3 additional days to program exercise sessions.   Initial Home Exercises Provided -- -- -- 06/22/20 06/22/20   Row Name 07/18/20 1400 08/02/20 1300 08/29/20 1300 09/12/20 1500       Response to Exercise   Blood Pressure (Admit) 140/80 108/64 122/60 122/82    Blood Pressure (Exercise) 150/60 122/58 142/66 146/68    Blood Pressure (Exit) 106/68 104/60 114/64  132/70    Heart Rate (Admit) 64 bpm 87 bpm 82 bpm 82 bpm    Heart Rate (Exercise) 115 bpm 128 bpm 124 bpm 113 bpm    Heart Rate (Exit) 100 bpm 98 bpm 107 bpm 103 bpm    Rating of Perceived Exertion (Exercise) _1 Symptoms none none none none    Duration Continue with 30 min of aerobic exercise without signs/symptoms of physical distress. Continue with 30 min of aerobic exercise without signs/symptoms of physical distress. Continue with 30 min of aerobic exercise without signs/symptoms of physical distress. Continue with 30 min of aerobic exercise without signs/symptoms of physical distress.    Intensity THRR unchanged THRR unchanged THRR unchanged THRR unchanged         Progression   Progression Continue to  progress workloads to maintain intensity without signs/symptoms of physical distress. Continue to progress workloads to maintain intensity without signs/symptoms of physical distress. Continue to progress workloads to maintain intensity without signs/symptoms of physical distress. Continue to progress workloads to maintain intensity without signs/symptoms of physical distress.    Average METs 7.23 9.4 9.4 6.17         Resistance Training   Training Prescription Yes -- Yes Yes    Weight 12 lb -- 12 lb 12 lb    Reps 10-15 -- 10-15 10-15         Interval Training   Interval Training Yes Yes Yes Yes    Equipment Treadmill;Elliptical;REL-XR Treadmill;Elliptical;REL-XR Treadmill;Elliptical;REL-XR Treadmill;Elliptical;REL-XR    Comments going up to 9% grade, interval program on elliptical going up to 9% grade, interval program on elliptical going up to 9% grade, interval program on elliptical going up to 9.5% grade, interval program on elliptical         Treadmill   MPH 4 4.5 -- 4    Grade 9 8 -- 9.5    Minutes 15 30 -- 15    METs 9.8 9.41 -- 10.1         Recumbant Elliptical   Level -- -- -- 8    Minutes -- -- -- 15    METs -- -- -- 4.3         Elliptical   Level 6 --  -- 9    Speed 4.4 -- -- 5.9    Minutes 15 -- -- 15    METs 4.5 -- -- 4.1         REL-XR   Level 8 -- -- --    Minutes 15 -- -- --    METs 7.4 -- -- --         Home Exercise Plan   Plans to continue exercise at Home (comment)  walking, elliptical -- -- Home (comment)  walking, elliptical    Frequency Add 3 additional days to program exercise sessions. -- -- Add 3 additional days to program exercise sessions.    Initial Home Exercises Provided 06/22/20 -- -- 06/22/20           Exercise Comments:  Exercise Comments    Row Name 05/30/20 0807           Exercise Comments First full day of exercise!  Patient was oriented to gym and equipment including functions, settings, policies, and procedures.  Patient's individual exercise prescription and treatment plan were reviewed.  All starting workloads were established based on the results of the 6 minute walk test done at initial orientation visit.  The plan for exercise progression was also introduced and progression will be customized based on patient's performance and goals.              Exercise Goals and Review:  Exercise Goals    Row Name 05/25/20 0930             Exercise Goals   Increase Physical Activity Yes       Intervention Provide advice, education, support and counseling about physical activity/exercise needs.;Develop an individualized exercise prescription for aerobic and resistive training based on initial evaluation findings, risk stratification, comorbidities and participant's personal goals.       Expected Outcomes Short Term: Attend rehab on a regular basis to increase amount of physical activity.;Long Term: Add in home exercise to make exercise part of routine and to increase amount of physical activity.;Long Term: Exercising regularly at  least 3-5 days a week.       Increase Strength and Stamina Yes       Intervention Provide advice, education, support and counseling about physical activity/exercise  needs.;Develop an individualized exercise prescription for aerobic and resistive training based on initial evaluation findings, risk stratification, comorbidities and participant's personal goals.       Expected Outcomes Short Term: Increase workloads from initial exercise prescription for resistance, speed, and METs.;Long Term: Improve cardiorespiratory fitness, muscular endurance and strength as measured by increased METs and functional capacity (6MWT);Short Term: Perform resistance training exercises routinely during rehab and add in resistance training at home       Able to understand and use rate of perceived exertion (RPE) scale Yes       Intervention Provide education and explanation on how to use RPE scale       Expected Outcomes Short Term: Able to use RPE daily in rehab to express subjective intensity level;Long Term:  Able to use RPE to guide intensity level when exercising independently       Able to understand and use Dyspnea scale Yes       Intervention Provide education and explanation on how to use Dyspnea scale       Expected Outcomes Short Term: Able to use Dyspnea scale daily in rehab to express subjective sense of shortness of breath during exertion;Long Term: Able to use Dyspnea scale to guide intensity level when exercising independently       Knowledge and understanding of Target Heart Rate Range (THRR) Yes       Intervention Provide education and explanation of THRR including how the numbers were predicted and where they are located for reference       Expected Outcomes Short Term: Able to state/look up THRR;Short Term: Able to use daily as guideline for intensity in rehab;Long Term: Able to use THRR to govern intensity when exercising independently       Able to check pulse independently Yes       Intervention Provide education and demonstration on how to check pulse in carotid and radial arteries.;Review the importance of being able to check your own pulse for safety during  independent exercise       Expected Outcomes Short Term: Able to explain why pulse checking is important during independent exercise;Long Term: Able to check pulse independently and accurately       Understanding of Exercise Prescription Yes       Intervention Provide education, explanation, and written materials on patient's individual exercise prescription       Expected Outcomes Short Term: Able to explain program exercise prescription;Long Term: Able to explain home exercise prescription to exercise independently              Exercise Goals Re-Evaluation :  Exercise Goals Re-Evaluation    Row Name 05/30/20 [phone] 06/05/20 1710 06/21/20 0841 06/22/20 0737 06/29/20 0722     Exercise Goal Re-Evaluation   Exercise Goals Review Understanding of Exercise Prescription;Knowledge and understanding of Target Heart Rate Range (THRR);Able to understand and use rate of perceived exertion (RPE) scale;Able to understand and use Dyspnea scale -- Increase Physical Activity;Increase Strength and Stamina;Understanding of Exercise Prescription Increase Physical Activity;Increase Strength and Stamina;Understanding of Exercise Prescription;Able to understand and use rate of perceived exertion (RPE) scale;Able to understand and use Dyspnea scale;Knowledge and understanding of Target Heart Rate Range (THRR);Able to check pulse independently Increase Physical Activity;Increase Strength and Stamina;Understanding of Exercise Prescription   Comments Reviewed RPE and  dyspnea scales, THR and program prescription with pt today.  Pt voiced understanding and was given a copy of goals to take home. Ferrel has tolerated exercise well in his first 2 sessions.  We will monitor progress. Donaldson is doing well in rehab.  He is now doing intervals on the treadmill using the incline up to 8%.  We will continue to monitor his progress. Reviewed home exercise with pt today.  Pt plans to continue walking and using his elliptical at home for  exercise.  Reviewed THR, pulse, RPE, sign and symptoms, pulse oximetery and when to call 911 or MD.  Also discussed weather considerations and indoor options.  Pt voiced understanding. Kin is doing well in rehab.  He is exercising on his off days by walking and using his elliptical.  He was able to help someone move the other day.  He has plans to go play golf today.   Expected Outcomes Short: Use RPE daily to regulate intensity. Long: Follow program prescription in THR. Short: attend consistently Long: improve MET level Short: Continue to add in intervals and work up to them on elliptical  Long: Continue to improve stamina. Short: Continue to walk daily but focus on heart rate  Long: Continue to improve stamina. Short: Continue to get in exercise each day Long: Continue to improve stamina.   [place] Name 07/04/20 1309 07/18/20 1446 07/25/20 0733 08/02/20 1315 08/29/20 0726     Exercise Goal Re-Evaluation   Exercise Goals Review Increase Physical Activity;Increase Strength and Stamina Increase Physical Activity;Increase Strength and Stamina;Understanding of Exercise Prescription Increase Physical Activity;Increase Strength and Stamina;Understanding of Exercise Prescription Increase Physical Activity;Increase Strength and Stamina Increase Physical Activity;Increase Strength and Stamina   Comments Early is progressing well.  he goes up to 9.5% incline on the TM and level 5 on elliptical.  We will continue to monitor progress. Kemonte is about half way through the program and doing well.  He continues to do his intervals each day.  He is now up to 12 lb weights.  We will continue to monitor his progress. Zachary is doing well with exercising at home 3.25 miles per day (walking at a faster pace - 16 pace) - RPE 14. Plays basketball and golf. 200 push-ups, flutter kicks, curls, shoulder lifts, squats 4x/week - with 20lb weights. Bilily continues to progress well and does intervals on TM.  We will continue to monitor  progress. Keaun is doing great. He continues to exercise 3 days per week of exercise on top of rehab days, concluding a total of 5 days . He is walking 2.5-3.5 miles for aerobic/, accumulating about 40 minutes; Continues to do  push ups, sit ups, pull ups, squats, most exercises that we do in class. No complaints and is tolerating everything well. He checks his HR and find that he is in his range most times.   Expected Outcomes Short: continue consistent exercise Long:  increase MET level Short: Continue with intervals Long: Continue to improve stamina. Short: Continue with intervals in gym, continue exercising at home Long: Continue to improve stamina. Short: maintain current exercise schedule Long:  build overall stamina Short: Continue checking HR during exercise Continue Long: Continue appropriate exercise prescription at home   [place] Name 09/12/20 1552             Exercise Goal Re-Evaluation   Exercise Goals Review Increase Physical Activity;Increase Strength and Stamina;Understanding of Exercise Prescription       Comments Braylynn has been doing well in  rehab.  He is nearing graduation and will be needing his post 6MWT soon and we expect to see an improvement.  He continues to push himself and is now up to 9.5% grade!  We will continue to montior his progress.       Expected Outcomes Short: Improve post 6MWT Long: Continue to improve stamina              Discharge Exercise Prescription (Final Exercise Prescription Changes):  Exercise Prescription Changes - 09/12/20 1500      Response to Exercise   Blood Pressure (Admit) 122/82    Blood Pressure (Exercise) 146/68    Blood Pressure (Exit) 132/70    Heart Rate (Admit) 82 bpm    Heart Rate (Exercise) 113 bpm    Heart Rate (Exit) 103 bpm    Rating of Perceived Exertion (Exercise) 14    Symptoms none    Duration Continue with 30 min of aerobic exercise without signs/symptoms of physical distress.    Intensity THRR unchanged       Progression   Progression Continue to progress workloads to maintain intensity without signs/symptoms of physical distress.    Average METs 6.17      Resistance Training   Training Prescription Yes    Weight 12 lb    Reps 10-15      Interval Training   Interval Training Yes    Equipment Treadmill;Elliptical;REL-XR    Comments going up to 9.5% grade, interval program on elliptical      Treadmill   MPH 4    Grade 9.5    Minutes 15    METs 10.1      Recumbant Elliptical   Level 8    Minutes 15    METs 4.3      Elliptical   Level 9    Speed 5.9    Minutes 15    METs 4.1      Home Exercise Plan   Plans to continue exercise at Home (comment)   walking, elliptical   Frequency Add 3 additional days to program exercise sessions.    Initial Home Exercises Provided 06/22/20           Nutrition:  Target Goals: Understanding of nutrition guidelines, daily intake of sodium <1573m, cholesterol <2055m calories 30% from fat and 7% or less from saturated fats, daily to have 5 or more servings of fruits and vegetables.  Education: All About Nutrition: -Group instruction provided by verbal, written material, interactive activities, discussions, models, and posters to present general guidelines for heart healthy nutrition including fat, fiber, MyPlate, the role of sodium in heart healthy nutrition, utilization of the nutrition label, and utilization of this knowledge for meal planning. Follow up email sent as well. Written material given at graduation. [facility]  Education need identified 05/24/20  Date 07/06/20  Educator MCEvergreenInstruction Review Code 1- Verbalizes Understanding      Biometrics:  Pre Biometrics - 05/25/20 0931      Pre Biometrics   Height 5' 10.5" (1.791 m)    Weight 190 lb 3.2 oz (86.3 kg)    BMI (Calculated) 26.9    Single Leg Stand 28.8 seconds            Nutrition Therapy Plan and  Nutrition Goals:  Nutrition Therapy & Goals - 05/25/20 0925      Nutrition Therapy   Diet Heart healthy, low Na    Drug/Food Interactions  Statins/Certain Fruits    Protein (specify units) 70g    Fiber 30 grams    Whole Grain Foods 3 servings    Saturated Fats 12 max. grams    Fruits and Vegetables 8 servings/day    Sodium 1.5 grams      Personal Nutrition Goals   Nutrition Goal ST: check almond milk for fortification, reduce sodium using some of the techniques discussed (citrus, vinegar, etc), increase variety, try "nice" cream LT: fiber 30g/day, 8 fruits and vegetables per day    Comments He reports limiting red meat, eating pork chops 1x/month, chicken and fish mostly. He reports eating whole grains and having a garden of his own vegetables he eats over the summer. he drinks almond milk or 2% milk. He likes oikos yogurt. For breakfast he will have oatmeal or grits with fruit and ground flax; on the weekned he will have bacon and eggs. L: sandwich ([place] or peanut butter and jelly) D: chicken, fish more during the summer. During the winter they eat more crockpot meals like chili. Vegetables: leafy greens, string beans, butter beans, peas. Fruit: bananas, apples, clementines, fruit medley. S: ice cream or some fruit. Discussed heart healhty eating. Recommended trying "nice" cream (made with frozen bananas).      Intervention Plan   Intervention Prescribe, educate and counsel regarding individualized specific dietary modifications aiming towards targeted core components such as weight, hypertension, lipid management, diabetes, heart failure and other comorbidities.;Nutrition handout(s) given to patient.    Expected Outcomes Short Term Goal: Understand basic principles of dietary content, such as calories, fat, sodium, cholesterol and nutrients.;Short Term Goal: A plan has been developed with personal nutrition goals set during dietitian appointment.;Long Term Goal: Adherence to prescribed  nutrition plan.           Nutrition Assessments:  MEDIFICTS Score Key:  ?70 Need to make dietary changes   40-70 Heart Healthy Diet  ? 40 Therapeutic Level Cholesterol Diet  Flowsheet Row Cardiac Rehab from 05/25/2020 in [facility] Cardiac and Pulmonary Rehab  Picture Your Plate Total Score on Admission 83     Picture Your Plate Scores:  <17 Unhealthy dietary pattern with much room for improvement.  41-50 Dietary pattern unlikely to meet recommendations for good health and room for improvement.  51-60 More healthful dietary pattern, with some room for improvement.   >60 Healthy dietary pattern, although there may be some specific behaviors that could be improved.    Nutrition Goals Re-Evaluation:  Nutrition Goals Re-Evaluation    Row Name 06/29/20 0725 07/25/20 0738 08/29/20 0800         Goals   Nutrition Goal ST: check almond milk for fortification, reduce sodium using some of the techniques discussed (citrus, vinegar, etc), increase variety, try "nice" cream LT: fiber 30g/day, 8 fruits and vegetables per day ST: Try carbohydrate snack before workout and carbohydrate/protein snack post workout. LT: continue with heart healthy changes ST: Try carbohydrate snack before workout and carbohydrate/protein snack post workout. LT: continue with heart healthy changes     Comment Chuckie is doing well with his diet.  He eats lots of fish and chicken.  He gets lots of vegetables.  He has not had a steak in over a month.  He is using almond milk some now with his cereal.  He is trying to watch his sodium more. He reports doing well with his nutrition. He likes chicken and fish, he likes pasta and potatoes. B: oatmeal with fruit and honey - sometimes he will  have grits. He reports following MyPlate guidelines when eating and referring to his handouts given by RD. Continue to increase variety. Try carbohydrate snack before workout and carbohydrate/protein snack post workout. Kendell is doing well with  his diet, he does not wish to start any new goals at this time. He does admit to drinking a little more than usual, which is increasing his weight and caloric intake. It tends to connect with social events such as golfing and hanging with friends. We talked about importance of staying hydrating and monitoring his weight. Goal is to be back down around 190 lb.     Expected Outcome Short: Continue to watch sodium Long; Continue to eat healthier Short: Continue to watch sodium Long; Continue to eat healthier Short: Monitor weight Long: Continue heart healthy diet            Nutrition Goals Discharge (Final Nutrition Goals Re-Evaluation):  Nutrition Goals Re-Evaluation - 08/29/20 0800      Goals   Nutrition Goal ST: Try carbohydrate snack before workout and carbohydrate/protein snack post workout. LT: continue with heart healthy changes    Comment Zaiah is doing well with his diet, he does not wish to start any new goals at this time. He does admit to drinking a little more than usual, which is increasing his weight and caloric intake. It tends to connect with social events such as golfing and hanging with friends. We talked about importance of staying hydrating and monitoring his weight. Goal is to be back down around 190 lb.    Expected Outcome Short: Monitor weight Long: Continue heart healthy diet           Psychosocial: Target Goals: Acknowledge presence or absence of significant depression and/or stress, maximize coping skills, provide positive support system. Participant is able to verbalize types and ability to use techniques and skills needed for reducing stress and depression.   Education: Stress, Anxiety, and Depression - Group verbal and visual presentation to define topics covered.  Reviews how body is impacted by stress, anxiety, and depression.  Also discusses healthy ways to reduce stress and to treat/manage anxiety and depression.  Written material given at graduation. Flowsheet  Row Cardiac Rehab from 08/03/2020 in [facility] Cardiac and Pulmonary Rehab  Education need identified 05/24/20  Date 08/03/20  Educator [facility]  Instruction Review Code 1- [employer] Understanding      Education: Sleep Hygiene -Provides group verbal and written instruction about how sleep can affect your health.  Define sleep hygiene, discuss sleep cycles and impact of sleep habits. Review good sleep hygiene tips.    Initial Review & Psychosocial Screening:  Initial Psych Review & Screening - 05/18/20 1013      Initial Review   Current issues with None Identified      Family Dynamics   Good Support System? Yes    Comments He can look to his son and his wife for support. He has a good outlook on his health after his procedure and is ready to start exercising.      Barriers   Psychosocial barriers to participate in program There are no identifiable barriers or psychosocial needs.      Screening Interventions   Interventions To provide support and resources with identified psychosocial needs;Provide feedback about the scores to participant    Expected Outcomes Short Term goal: Utilizing psychosocial counselor, staff and physician to assist with identification of specific Stressors or current issues interfering with healing process. Setting desired goal for each stressor or  current issue identified.;Long Term Goal: Stressors or current issues are controlled or eliminated.;Short Term goal: Identification and review with participant of any Quality of Life or Depression concerns found by scoring the questionnaire.;Long Term goal: The participant improves quality of Life and PHQ9 Scores as seen by post scores and/or verbalization of changes           Quality of Life Scores:   Quality of Life - 05/24/20 1313      Quality of Life   Select Quality of Life      Quality of Life Scores   Health/Function Pre 25.07 %    Socioeconomic Pre 26.25 %    Psych/Spiritual Pre 28.07 %    Family Pre 28.8 %     GLOBAL Pre 26.47 %          Scores of 19 and below usually indicate a poorer quality of life in these areas.  A difference of  2-3 points is a clinically meaningful difference.  A difference of 2-3 points in the total score of the Quality of Life Index has been associated with significant improvement in overall quality of life, self-image, physical symptoms, and general health in studies assessing change in quality of life.  PHQ-9: Recent Review Flowsheet Data    Depression screen [facility] 2/9 07/25/2020 05/25/2020 04/06/2019 07/30/2018 06/29/2018   Decreased Interest 0 0 0 0 0   Down, Depressed, Hopeless 0 0 0 0 0   PHQ - 2 Score 0 0 0 0 0   Altered sleeping 0 1 0 - -   Tired, decreased energy 0 0 0 - -   Change in appetite 0 0 0 - -   Feeling bad or failure about yourself  0 0 0 - -   Trouble concentrating 0 0 0 - -   Moving slowly or fidgety/restless 0 0 0 - -   Suicidal thoughts 0 0 0 - -   PHQ-9 Score 0 1 0 - -   Difficult doing work/chores Not difficult at all Not difficult at all Not difficult at all - -     Interpretation of Total Score  Total Score Depression Severity:  1-4 = Minimal depression, 5-9 = Mild depression, 10-14 = Moderate depression, 15-19 = Moderately severe depression, 20-27 = Severe depression   Psychosocial Evaluation and Intervention:  Psychosocial Evaluation - 05/18/20 1014      Psychosocial Evaluation & Interventions   Interventions Encouraged to exercise with the program and follow exercise prescription;Relaxation education;Stress management education    Comments He can look to his son and his wife for support. He has a good outlook on his health after his procedure and is ready to start exercising.    Expected Outcomes Short: Exercise regularly to support mental health and notify staff of any changes. Long: maintain mental health and well being through teaching of rehab or prescribed medications independently.    Continue Psychosocial Services  Follow up required  by staff           Psychosocial Re-Evaluation:  Psychosocial Re-Evaluation    Row Name 06/29/20 [phone] 07/25/20 0736 08/29/20 0740         Psychosocial Re-Evaluation   Current issues with None Identified -- None Identified     Comments Yuval is doing well in rehab. He feels good mentally and declines any major stressors.  He sleeps well most days.  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 Zoe is doing well in rehab. He feels good mentally and declines any major stressors. He sleeps well most days. He plays golf and basketball - he also likes reading. 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 Kalee is doing well mentally. Has good support from his family, especially including his wife. Sleep is good and has no complainnts. He is not on any medications for mental health and denies any anxiety or depression. He stays busy golfing with his friends and hanging out with them. Staying compliant with all his other medications.     Expected Outcomes Short: Continue to make time for exercise Long: Continue to stay positive. Short: Continue stress management Long: Continue to stay positive. Short: Continue attendance with rehab Long: Maintain positive attitude     Interventions Encouraged to attend Cardiac Rehabilitation for the exercise Encouraged to attend Cardiac Rehabilitation for the exercise Encouraged to attend Cardiac Rehabilitation for the exercise     Continue Psychosocial Services  Follow up required by staff Follow up required by staff Follow up required by staff            Psychosocial Discharge (Final Psychosocial Re-Evaluation):  Psychosocial Re-Evaluation - 08/29/20 0740      Psychosocial Re-Evaluation   Current issues with None Identified    Comments Omri is doing well mentally. Has good support from his family, especially including his  wife. Sleep is good and has no complainnts. He is not on any medications for mental health and denies any anxiety or depression. He stays busy golfing with his friends and hanging out with them. Staying compliant with all his other medications.    Expected Outcomes Short: Continue attendance with rehab Long: Maintain positive attitude    Interventions Encouraged to attend Cardiac Rehabilitation for the exercise    Continue Psychosocial Services  Follow up required by staff           Vocational Rehabilitation: Provide vocational rehab assistance to qualifying candidates.   Vocational Rehab Evaluation & Intervention:   Education: Education Goals: Education classes will be provided on a variety of topics geared toward better understanding of heart health and risk factor modification. Participant will state understanding/return demonstration of topics presented as noted by education test scores.  Learning Barriers/Preferences:  Learning Barriers/Preferences - 05/18/20 1009      Learning Barriers/Preferences   Learning Barriers None    Learning Preferences None           General Cardiac Education Topics:  AED/CPR: - Group verbal and written instruction with the use of models to demonstrate the basic use of the AED with the basic ABC's of resuscitation.   Anatomy and Cardiac Procedures: - Group verbal and visual presentation and models provide information about basic cardiac anatomy and function. Reviews the testing methods done to diagnose heart disease and the outcomes of the test results. Describes the treatment choices: Medical Management, Angioplasty, or Coronary Bypass Surgery for treating various heart conditions including Myocardial Infarction, Angina, Valve Disease, and Cardiac Arrhythmias.  Written material given at graduation. Flowsheet Row Cardiac Rehab from 08/03/2020 in [facility] Cardiac and Pulmonary Rehab  Date 06/22/20  Educator SB  Instruction Review Code 1- Verbalizes  Understanding      Medication Safety: - Group verbal and visual instruction to review commonly prescribed medications for heart and lung disease. Reviews the medication, class of the drug, and side effects.  Includes the steps to properly store meds and maintain the prescription regimen.  Written material given at graduation. Flowsheet Row Cardiac Rehab from 08/03/2020 in [facility] Cardiac and Pulmonary Rehab  Date 07/13/20  Educator SB  Instruction Review Code 1- Verbalizes Understanding      Intimacy: - Group verbal instruction through game format to discuss how heart and lung disease can affect sexual intimacy. Written material given at graduation.. Flowsheet Row Cardiac Rehab from 08/03/2020 in [facility] Cardiac and Pulmonary Rehab  Date 06/15/20  Educator jh  Instruction Review Code 1- Verbalizes Understanding      Know Your Numbers and Heart Failure: - Group verbal and visual instruction to discuss disease risk factors for cardiac and pulmonary disease and treatment options.  Reviews associated critical values for Overweight/Obesity, Hypertension, Cholesterol, and Diabetes.  Discusses basics of heart failure: signs/symptoms and treatments.  Introduces Heart Failure Zone chart for action plan for heart failure.  Written material given at graduation.   Infection Prevention: - Provides verbal and written material to individual with discussion of infection control including proper hand washing and proper equipment cleaning during exercise session. Flowsheet Row Cardiac Rehab from 08/03/2020 in [facility] Cardiac and Pulmonary Rehab  Date 05/18/20  Educator [facility]  Instruction Review Code 1- Verbalizes Understanding      Falls Prevention: - Provides verbal and written material to individual with discussion of falls prevention and safety. Flowsheet Row Cardiac Rehab from 08/03/2020 in [facility] Cardiac and Pulmonary Rehab  Date 05/18/20  Educator [facility]  Instruction Review Code 1- Verbalizes Understanding       Other: -Provides group and verbal instruction on various topics (see comments)   Knowledge Questionnaire Score:  Knowledge Questionnaire Score - 05/24/20 1313      Knowledge Questionnaire Score   Pre Score 23/26 Education Focus: Depression, nutrition           Core Components/Risk Factors/Patient Goals at Admission:  Personal Goals and Risk Factors at Admission - 05/25/20 0932      Core Components/Risk Factors/Patient Goals on Admission    Weight Management Yes;Weight Loss    Intervention Weight Management: Develop a combined nutrition and exercise program designed to reach desired caloric intake, while maintaining appropriate intake of nutrient and fiber, sodium and fats, and appropriate energy expenditure required for the weight goal.;Weight Management: Provide education and appropriate resources to help participant work on and attain dietary goals.;Weight Management/Obesity: Establish reasonable short term and long term weight goals.    Admit Weight 190 lb 3.2 oz (86.3 kg)    Goal Weight: Short Term 185 lb (83.9 kg)    Goal Weight: Long Term 185 lb (83.9 kg)    Expected Outcomes Short Term: Continue to assess and modify interventions until short term weight is achieved;Long Term: Adherence to nutrition and physical activity/exercise program aimed toward attainment of established weight goal;Understanding recommendations for meals to include 15-35% energy as protein, 25-35% energy from fat, 35-60% energy from carbohydrates, less than 292m of dietary cholesterol, 20-35 gm of total fiber daily;Understanding of distribution of calorie intake throughout the day with the consumption of 4-5 meals/snacks;Weight Loss: Understanding of general recommendations for a balanced deficit meal plan, which promotes 1-2 lb weight loss per week and includes a negative energy balance of [phone] kcal/d;Weight Maintenance: Understanding of the daily nutrition guidelines, which includes 25-35% calories  from fat, 7% or less cal from saturated fats, less than 2055mcholesterol, less than 1.5gm of sodium, & 5 or more servings of fruits and vegetables daily  Hypertension Yes    Intervention Provide education on lifestyle modifcations including regular physical activity/exercise, weight management, moderate sodium restriction and increased consumption of fresh fruit, vegetables, and low fat dairy, alcohol moderation, and smoking cessation.;Monitor prescription use compliance.    Expected Outcomes Short Term: Continued assessment and intervention until BP is < 140/12m HG in hypertensive participants. < 130/862mHG in hypertensive participants with diabetes, heart failure or chronic kidney disease.;Long Term: Maintenance of blood pressure at goal levels.    Lipids Yes    Intervention Provide education and support for participant on nutrition & aerobic/resistive exercise along with prescribed medications to achieve LDL <701mHDL >3m2m  Expected Outcomes Short Term: Participant states understanding of desired cholesterol values and is compliant with medications prescribed. Participant is following exercise prescription and nutrition guidelines.;Long Term: Cholesterol controlled with medications as prescribed, with individualized exercise RX and with personalized nutrition plan. Value goals: LDL < 70mg61mL > 40 mg.           Education:Diabetes - Individual verbal and written instruction to review signs/symptoms of diabetes, desired ranges of glucose level fasting, after meals and with exercise. Acknowledge that pre and post exercise glucose checks will be done for 3 sessions at entry of program.   Core Components/Risk Factors/Patient Goals Review:   Goals and Risk Factor Review    Row Name 06/29/20 0726 07/25/20 0741 08/29/20 0733         Core Components/Risk Factors/Patient Goals Review   Personal Goals Review Weight Management/Obesity;Hypertension Weight  Management/Obesity;Hypertension;Improve shortness of breath with ADL's Weight Management/Obesity;Hypertension     Review BillyRiversoing well in rehab.  His weight was 188 lb today.  He tries to keep it around 185 lb at home.  Blood pressures have been good in class and he keeps an eye on them at home.  He is pleased with where things are currently. BillyDemontoing well in rehab.  His weight was 189 lb today. He tries to keep it around 185 lb at home - he tries to saty between 185 and 190lbs. He checks his BP at home (<115/<75 in am,130/75-80 in pm)  this morning 108/64. He reports doctors took him off all BP medication December 2nd. He reports he would also like to manage his shortness of breath recovery. BP has been stable at rehab, at home it can range 120-1195-093Oolic. He is currently not on any BP meds and his doctor is pleased with his numbers. Has cuff at home to take it and makes sure he checks it, especially if he ever feels off. Weight has been up, however, admits to intaking more excessive calories. He is continuing to watch  his weight and plans to move a little more to help lose those last couple of pounds.     Expected Outcomes Short: Continue to maintain weight Long: Continue to monitor risk factors Short: Continue to maintain weight Long: Continue to monitor risk factors Short: Continue to monitor weight Long: Manage lifestyle risk factors            Core Components/Risk Factors/Patient Goals at Discharge (Final Review):   Goals and Risk Factor Review - 08/29/20 0733      Core Components/Risk Factors/Patient Goals Review   Personal Goals Review Weight Management/Obesity;Hypertension    Review BP has been stable at rehab, at home it can range 120-1671-245Yolic. He is currently not on any BP meds and his doctor is pleased with his numbers. Has cuff at home to  take it and makes sure he checks it, especially if he ever feels off. Weight has been up, however, admits to intaking more excessive  calories. He is continuing to watch  his weight and plans to move a little more to help lose those last couple of pounds.    Expected Outcomes Short: Continue to monitor weight Long: Manage lifestyle risk factors           ITP Comments:  ITP Comments    Row Name 05/18/20 1009 05/25/20 0926 05/30/20 0807 06/21/20 0610 07/19/20 0713   ITP Comments Virtual Visit completed. Patient informed on EP and RD appointment and 6 Minute walk test. Patient also informed of patient health questionnaires on My Chart. Patient Verbalizes understanding. Visit diagnosis can be found in [facility] 05/01/2020. Completed 6MWT and gym orientation. Initial ITP created and sent for review to Dr. Emily Filbert, Medical Director. First full day of exercise!  Patient was oriented to gym and equipment including functions, settings, policies, and procedures.  Patient's individual exercise prescription and treatment plan were reviewed.  All starting workloads were established based on the results of the 6 minute walk test done at initial orientation visit.  The plan for exercise progression was also introduced and progression will be customized based on patient's performance and goals. 30 Day review completed. Medical Director ITP review done, changes made as directed, and signed approval by Medical Director. 30 Day review completed. Medical Director ITP review done, changes made as directed, and signed approval by Medical Director.   [place] Name 08/16/20 0904 09/13/20 0615         ITP Comments 30 Day review completed. Medical Director ITP review done, changes made as directed, and signed approval by Medical Director. 30 Day review completed. Medical Director ITP review done, changes made as directed, and signed approval by Medical Director.             Comments:

## 2020-09-19 ENCOUNTER — Encounter (HOSPITAL_BASED_OUTPATIENT_CLINIC_OR_DEPARTMENT_OTHER): Payer: BC Managed Care – PPO

## 2020-09-19 ENCOUNTER — Other Ambulatory Visit: Payer: Self-pay

## 2020-09-19 DIAGNOSIS — Z9889 Other specified postprocedural states: Secondary | ICD-10-CM | POA: Diagnosis not present

## 2020-09-19 DIAGNOSIS — Z8679 Personal history of other diseases of the circulatory system: Secondary | ICD-10-CM | POA: Diagnosis not present

## 2020-09-19 NOTE — Progress Notes (Signed)
Daily Session Note  Patient Details  Name: Jorge [name] MRN: [id number] Date of Birth: 04/23/1966 Referring Provider:   Flowsheet Row Cardiac Rehab from 05/25/2020 in [facility] Cardiac and Pulmonary Rehab  Referring Provider Thompson Grayer MD      Encounter Date: 09/19/2020  Check In:  Session Check In - 09/19/20 0732      Check-In   Supervising physician immediately available to respond to emergencies See telemetry face sheet for immediately available ER MD    Location [facility]-Cardiac & Pulmonary Rehab    Staff Present Birdie Sons, MPA, RN;Amanda Oletta Darter, BA, ACSM CEP, Exercise Physiologist;Kara Eliezer Bottom, MS, ASCM CEP, Exercise Physiologist    Virtual Visit No    Medication changes reported     No    Fall or balance concerns reported    No    Warm-up and Cool-down Performed on first and last piece of equipment    Resistance Training Performed Yes    VAD Patient? No    PAD/SET Patient? No      Pain Assessment   Currently in Pain? No/denies              Social History   Tobacco Use  Smoking Status Never Smoker  Smokeless Tobacco Never Used    Goals Met:  Independence with exercise equipment Exercise tolerated well No report of cardiac concerns or symptoms Strength training completed today  Goals Unmet:  Not Applicable  Comments: Pt able to follow exercise prescription today without complaint.  Will continue to monitor for progression.    Dr. Emily Filbert is Medical Director for [facility].  Dr. Ottie Glazier is Medical Director for [facility] Pulmonary Rehabilitation.

## 2020-09-21 ENCOUNTER — Other Ambulatory Visit: Payer: Self-pay

## 2020-09-21 VITALS — Ht 70.5 in | Wt 194.6 lb

## 2020-09-21 DIAGNOSIS — Z8679 Personal history of other diseases of the circulatory system: Secondary | ICD-10-CM

## 2020-09-21 DIAGNOSIS — Z9889 Other specified postprocedural states: Secondary | ICD-10-CM | POA: Diagnosis not present

## 2020-09-21 NOTE — Progress Notes (Signed)
Carelink Summary Report / Loop Recorder 

## 2020-09-21 NOTE — Progress Notes (Signed)
Daily Session Note  Patient Details  Name: KELVIS BERGER MRN: [id number] Date of Birth: 04-13-1966 Referring Provider:   Flowsheet Row Cardiac Rehab from 05/25/2020 in [facility] Cardiac and Pulmonary Rehab  Referring Provider Thompson Grayer MD      Encounter Date: 09/21/2020  Check In:  Session Check In - 09/21/20 0720      Check-In   Supervising physician immediately available to respond to emergencies See telemetry face sheet for immediately available ER MD    Location [facility]-Cardiac & Pulmonary Rehab    Staff Present Birdie Sons, MPA, RN;Melissa Caiola RDN, Rowe Pavy, BA, ACSM CEP, Exercise Physiologist    Virtual Visit No    Medication changes reported     No    Fall or balance concerns reported    No    Warm-up and Cool-down Performed on first and last piece of equipment    Resistance Training Performed Yes    VAD Patient? No    PAD/SET Patient? No      Pain Assessment   Currently in Pain? No/denies              Social History   Tobacco Use  Smoking Status Never Smoker  Smokeless Tobacco Never Used    Goals Met:  Independence with exercise equipment Exercise tolerated well No report of cardiac concerns or symptoms Strength training completed today  Goals Unmet:  Not Applicable  Comments: Pt able to follow exercise prescription today without complaint.  Will continue to monitor for progression.   [place] Name 05/25/20 0926 09/21/20 0811       6 Minute Walk   Phase Initial Discharge    Distance 1515 feet 1950 feet    Distance % Change -- 28.7 %    Distance Feet Change -- 435 ft    Walk Time 6 minutes 6 minutes    # of Rest Breaks 0 0    MPH 2.87 3.69    METS 4.34 5.39    RPE 10 14    Perceived Dyspnea  1.5 --    VO2 Peak 15.19 18.85    Symptoms Yes (comment) Yes (comment)    Comments incisional soreness 2/10 --    Resting HR 93 bpm 66 bpm    Resting BP 122/64 126/78    Resting Oxygen Saturation  98 % 98 %    Exercise  Oxygen Saturation  during 6 min walk 96 % 91 %    Max Ex. HR 108 bpm 115 bpm    Max Ex. BP 134/64 164/62    2 Minute Post BP 108/60 --            Dr. Emily Filbert is Medical Director for [place].  Dr. Ottie Glazier is Medical Director for [facility] Pulmonary Rehabilitation.

## 2020-09-26 ENCOUNTER — Encounter: Payer: BC Managed Care – PPO | Admitting: *Deleted

## 2020-09-26 ENCOUNTER — Other Ambulatory Visit: Payer: Self-pay

## 2020-09-26 DIAGNOSIS — Z8679 Personal history of other diseases of the circulatory system: Secondary | ICD-10-CM

## 2020-09-26 DIAGNOSIS — Z9889 Other specified postprocedural states: Secondary | ICD-10-CM | POA: Diagnosis not present

## 2020-09-26 NOTE — Progress Notes (Signed)
Daily Session Note  Patient Details  Name: Jorge [name] MRN: [id number] Date of Birth: 01-23-1966 Referring Provider:   Flowsheet Row Cardiac Rehab from 05/25/2020 in [facility] Cardiac and Pulmonary Rehab  Referring Provider Thompson Grayer MD      Encounter Date: 09/26/2020  Check In:  Session Check In - 09/26/20 0052      Check-In   Supervising physician immediately available to respond to emergencies See telemetry face sheet for immediately available ER MD    Location [facility]-Cardiac & Pulmonary Rehab    Staff Present Heath Lark, RN, BSN, Jacklynn Bue, MS, ASCM CEP, Exercise Physiologist;Melissa Caiola RDN, LDN    Virtual Visit No    Medication changes reported     No    Fall or balance concerns reported    No    Warm-up and Cool-down Performed on first and last piece of equipment    Resistance Training Performed Yes    VAD Patient? No    PAD/SET Patient? No      Pain Assessment   Currently in Pain? No/denies              Social History   Tobacco Use  Smoking Status Never Smoker  Smokeless Tobacco Never Used    Goals Met:  Independence with exercise equipment Exercise tolerated well No report of cardiac concerns or symptoms  Goals Unmet:  Not Applicable  Comments: Pt able to follow exercise prescription today without complaint.  Will continue to monitor for progression.    Dr. Emily Filbert is Medical Director for [place].  Dr. Ottie Glazier is Medical Director for [facility] Pulmonary Rehabilitation.

## 2020-09-29 NOTE — Addendum Note (Signed)
Addended by: Douglass Rivers D on: 09/29/2020 05:18 PM   Modules accepted: Level of Service

## 2020-10-03 ENCOUNTER — Encounter: Payer: BC Managed Care – PPO | Attending: Internal Medicine

## 2020-10-03 ENCOUNTER — Other Ambulatory Visit: Payer: Self-pay

## 2020-10-03 DIAGNOSIS — Z9889 Other specified postprocedural states: Secondary | ICD-10-CM | POA: Insufficient documentation

## 2020-10-03 DIAGNOSIS — Z8679 Personal history of other diseases of the circulatory system: Secondary | ICD-10-CM

## 2020-10-03 NOTE — Patient Instructions (Signed)
Discharge Patient Instructions  Patient Details  Name: Jorge [name] MRN: [id number] Date of Birth: Sep 21, 1965 Referring Provider:  Thompson Grayer, MD   Number of Visits: 25  Reason for Discharge:  Patient reached a stable level of exercise. Patient independent in their exercise. Patient has met program and personal goals.  Smoking History:  Social History   Tobacco Use  Smoking Status Never Smoker  Smokeless Tobacco Never Used    Diagnosis:  Status post Maze operation for atrial fibrillation  Initial Exercise Prescription:  Initial Exercise Prescription - 05/25/20 0900      Date of Initial Exercise RX and Referring Provider   Date 05/25/20    Referring Provider Thompson Grayer MD      Treadmill   MPH 2.8    Grade 2    Minutes 15    METs 3.92      Recumbant Elliptical   Level 2    RPM 50    Minutes 15    METs 3      Elliptical   Level 1    Speed 4.4    Minutes 15      Prescription Details   Frequency (times per week) 2    Duration Progress to 30 minutes of continuous aerobic without signs/symptoms of physical distress      Intensity   THRR 40-80% of Max Heartrate 122-151    Ratings of Perceived Exertion 11-13    Perceived Dyspnea 0-4      Progression   Progression Continue to progress workloads to maintain intensity without signs/symptoms of physical distress.      Resistance Training   Training Prescription Yes    Weight 6 lb    Reps 10-15           Discharge Exercise Prescription (Final Exercise Prescription Changes):  Exercise Prescription Changes - 09/27/20 1500      Response to Exercise   Blood Pressure (Admit) 142/70    Blood Pressure (Exercise) 132/78    Blood Pressure (Exit) 120/72    Heart Rate (Admit) 70 bpm    Heart Rate (Exercise) 109 bpm    Heart Rate (Exit) 98 bpm    Rating of Perceived Exertion (Exercise) 14    Symptoms none    Duration Continue with 30 min of aerobic exercise without signs/symptoms of physical  distress.    Intensity THRR unchanged      Progression   Progression Continue to progress workloads to maintain intensity without signs/symptoms of physical distress.    Average METs 6      Resistance Training   Training Prescription Yes    Weight 12    Reps 10-15           Functional Capacity:  6 Minute Walk    Row Name 05/25/20 0926 09/21/20 0811       6 Minute Walk   Phase Initial Discharge    Distance 1515 feet 1950 feet    Distance % Change -- 28.7 %    Distance Feet Change -- 435 ft    Walk Time 6 minutes 6 minutes    # of Rest Breaks 0 0    MPH 2.87 3.69    METS 4.34 5.39    RPE 10 14    Perceived Dyspnea  1.5 --    VO2 Peak 15.19 18.85    Symptoms Yes (comment) Yes (comment)    Comments incisional soreness 2/10 --    Resting HR 93 bpm 66 bpm  Resting BP 122/64 126/78    Resting Oxygen Saturation  98 % 98 %    Exercise Oxygen Saturation  during 6 min walk 96 % 91 %    Max Ex. HR 108 bpm 115 bpm    Max Ex. BP 134/64 164/62    2 Minute Post BP 108/60 --            Nutrition & Weight - Outcomes:  Pre Biometrics - 05/25/20 0931      Pre Biometrics   Height 5' 10.5" (1.791 m)    Weight 190 lb 3.2 oz (86.3 kg)    BMI (Calculated) 26.9    Single Leg Stand 28.8 seconds           Post Biometrics - 09/21/20 0811       Post  Biometrics   Height 5' 10.5" (1.791 m)    Weight 194 lb 9.6 oz (88.3 kg)    BMI (Calculated) 27.52    Single Leg Stand 30 seconds           Nutrition:  Nutrition Therapy & Goals - 05/25/20 0925      Nutrition Therapy   Diet Heart healthy, low Na    Drug/Food Interactions Statins/Certain Fruits    Protein (specify units) 70g    Fiber 30 grams    Whole Grain Foods 3 servings    Saturated Fats 12 max. grams    Fruits and Vegetables 8 servings/day    Sodium 1.5 grams      Personal Nutrition Goals   Nutrition Goal ST: check almond milk for fortification, reduce sodium using some of the techniques discussed (citrus,  vinegar, etc), increase variety, try "nice" cream LT: fiber 30g/day, 8 fruits and vegetables per day    Comments He reports limiting red meat, eating pork chops 1x/month, chicken and fish mostly. He reports eating whole grains and having a garden of his own vegetables he eats over the summer. he drinks almond milk or 2% milk. He likes oikos yogurt. For breakfast he will have oatmeal or grits with fruit and ground flax; on the weekned he will have bacon and eggs. L: sandwich ([place] or peanut butter and jelly) D: chicken, fish more during the summer. During the winter they eat more crockpot meals like chili. Vegetables: leafy greens, string beans, butter beans, peas. Fruit: bananas, apples, clementines, fruit medley. S: ice cream or some fruit. Discussed heart healhty eating. Recommended trying "nice" cream (made with frozen bananas).      Intervention Plan   Intervention Prescribe, educate and counsel regarding individualized specific dietary modifications aiming towards targeted core components such as weight, hypertension, lipid management, diabetes, heart failure and other comorbidities.;Nutrition handout(s) given to patient.    Expected Outcomes Short Term Goal: Understand basic principles of dietary content, such as calories, fat, sodium, cholesterol and nutrients.;Short Term Goal: A plan has been developed with personal nutrition goals set during dietitian appointment.;Long Term Goal: Adherence to prescribed nutrition plan.          Goals reviewed with patient; copy given to patient.

## 2020-10-03 NOTE — Progress Notes (Signed)
Discharge Progress Report  Patient Details  Name: Jorge [name] MRN: [id number] Date of Birth: [birth year] Referring Provider:   Flowsheet Row Cardiac Rehab from 05/25/2020 in [facility] Cardiac and Pulmonary Rehab  Referring Provider Thompson Grayer MD       Number of Visits: 33  Reason for Discharge:  Patient reached a stable level of exercise. Patient independent in their exercise. Patient has met program and personal goals.  Smoking History:  Social History   Tobacco Use  Smoking Status Never Smoker  Smokeless Tobacco Never Used    Diagnosis:  Status post Maze operation for atrial fibrillation  ADL [facility]SD:   Initial Exercise Prescription:  Initial Exercise Prescription - 05/25/20 0900      Date of Initial Exercise RX and Referring Provider   Date 05/25/20    Referring Provider Thompson Grayer MD      Treadmill   MPH 2.8    Grade 2    Minutes 15    METs 3.92      Recumbant Elliptical   Level 2    RPM 50    Minutes 15    METs 3      Elliptical   Level 1    Speed 4.4    Minutes 15      Prescription Details   Frequency (times per week) 2    Duration Progress to 30 minutes of continuous aerobic without signs/symptoms of physical distress      Intensity   THRR 40-80% of Max Heartrate 122-151    Ratings of Perceived Exertion 11-13    Perceived Dyspnea 0-4      Progression   Progression Continue to progress workloads to maintain intensity without signs/symptoms of physical distress.      Resistance Training   Training Prescription Yes    Weight 6 lb    Reps 10-15           Discharge Exercise Prescription (Final Exercise Prescription Changes):  Exercise Prescription Changes - 09/27/20 1500      Response to Exercise   Blood Pressure (Admit) 142/70    Blood Pressure (Exercise) 132/78    Blood Pressure (Exit) 120/72    Heart Rate (Admit) 70 bpm    Heart Rate (Exercise) 109 bpm    Heart Rate (Exit) 98 bpm    Rating of Perceived Exertion (Exercise)  14    Symptoms none    Duration Continue with 30 min of aerobic exercise without signs/symptoms of physical distress.    Intensity THRR unchanged      Progression   Progression Continue to progress workloads to maintain intensity without signs/symptoms of physical distress.    Average METs 6      Resistance Training   Training Prescription Yes    Weight 12    Reps 10-15           Functional Capacity:  6 Minute Walk    Row Name 05/25/20 0926 09/21/20 0811       6 Minute Walk   Phase Initial Discharge    Distance 1515 feet 1950 feet    Distance % Change -- 28.7 %    Distance Feet Change -- 435 ft    Walk Time 6 minutes 6 minutes    # of Rest Breaks 0 0    MPH 2.87 3.69    METS 4.34 5.39    RPE 10 14    Perceived Dyspnea  1.5 --    VO2 Peak 15.19 18.85  Symptoms Yes (comment) Yes (comment)    Comments incisional soreness 2/10 --    Resting HR 93 bpm 66 bpm    Resting BP 122/64 126/78    Resting Oxygen Saturation  98 % 98 %    Exercise Oxygen Saturation  during 6 min walk 96 % 91 %    Max Ex. HR 108 bpm 115 bpm    Max Ex. BP 134/64 164/62    2 Minute Post BP 108/60 --           Psychological, QOL, Others - Outcomes: PHQ 2/9: Depression screen [facility] 2/9 07/25/2020 05/25/2020 04/06/2019 07/30/2018 06/29/2018  Decreased Interest 0 0 0 0 0  Down, Depressed, Hopeless 0 0 0 0 0  PHQ - 2 Score 0 0 0 0 0  Altered sleeping 0 1 0 - -  Tired, decreased energy 0 0 0 - -  Change in appetite 0 0 0 - -  Feeling bad or failure about yourself  0 0 0 - -  Trouble concentrating 0 0 0 - -  Moving slowly or fidgety/restless 0 0 0 - -  Suicidal thoughts 0 0 0 - -  PHQ-9 Score 0 1 0 - -  Difficult doing work/chores Not difficult at all Not difficult at all Not difficult at all - -  Some recent data might be hidden    Quality of Life:  Quality of Life - 05/24/20 1313      Quality of Life   Select Quality of Life      Quality of Life Scores   Health/Function Pre 25.07 %     Socioeconomic Pre 26.25 %    Psych/Spiritual Pre 28.07 %    Family Pre 28.8 %    GLOBAL Pre 26.47 %           Nutrition & Weight - Outcomes:  Pre Biometrics - 05/25/20 0931      Pre Biometrics   Height 5' 10.5" (1.791 m)    Weight 190 lb 3.2 oz (86.3 kg)    BMI (Calculated) 26.9    Single Leg Stand 28.8 seconds           Post Biometrics - 09/21/20 0811       Post  Biometrics   Height 5' 10.5" (1.791 m)    Weight 194 lb 9.6 oz (88.3 kg)    BMI (Calculated) 27.52    Single Leg Stand 30 seconds           Nutrition:  Nutrition Therapy & Goals - 05/25/20 0925      Nutrition Therapy   Diet Heart healthy, low Na    Drug/Food Interactions Statins/Certain Fruits    Protein (specify units) 70g    Fiber 30 grams    Whole Grain Foods 3 servings    Saturated Fats 12 max. grams    Fruits and Vegetables 8 servings/day    Sodium 1.5 grams      Personal Nutrition Goals   Nutrition Goal ST: check almond milk for fortification, reduce sodium using some of the techniques discussed (citrus, vinegar, etc), increase variety, try "nice" cream LT: fiber 30g/day, 8 fruits and vegetables per day    Comments He reports limiting red meat, eating pork chops 1x/month, chicken and fish mostly. He reports eating whole grains and having a garden of his own vegetables he eats over the summer. he drinks almond milk or 2% milk. He likes oikos yogurt. For breakfast he will have oatmeal or grits with fruit  and ground flax; on the weekned he will have bacon and eggs. L: sandwich ([place] or peanut butter and jelly) D: chicken, fish more during the summer. During the winter they eat more crockpot meals like chili. Vegetables: leafy greens, string beans, butter beans, peas. Fruit: bananas, apples, clementines, fruit medley. S: ice cream or some fruit. Discussed heart healhty eating. Recommended trying "nice" cream (made with frozen bananas).      Intervention Plan   Intervention Prescribe, educate and  counsel regarding individualized specific dietary modifications aiming towards targeted core components such as weight, hypertension, lipid management, diabetes, heart failure and other comorbidities.;Nutrition handout(s) given to patient.    Expected Outcomes Short Term Goal: Understand basic principles of dietary content, such as calories, fat, sodium, cholesterol and nutrients.;Short Term Goal: A plan has been developed with personal nutrition goals set during dietitian appointment.;Long Term Goal: Adherence to prescribed nutrition plan.           Nutrition Discharge:   Education Questionnaire Score:  Knowledge Questionnaire Score - 05/24/20 1313      Knowledge Questionnaire Score   Pre Score 23/26 Education Focus: Depression, nutrition           Goals reviewed with patient; copy given to patient.

## 2020-10-03 NOTE — Progress Notes (Signed)
Daily Session Note  Patient Details  Name: Jorge [name] MRN: [id number] Date of Birth: 10/01/1965 Referring Provider:   Flowsheet Row Cardiac Rehab from 05/25/2020 in [facility] Cardiac and Pulmonary Rehab  Referring Provider Allred, James MD      Encounter Date: 10/03/2020  Check In:  Session Check In - 10/03/20 0722      Check-In   Supervising physician immediately available to respond to emergencies See telemetry face sheet for immediately available ER MD    Location [facility]-Cardiac & Pulmonary Rehab    Staff Present Kelly Bollinger, MPA, RN;Amanda Sommer, BA, ACSM CEP, Exercise Physiologist;Kara Langdon, MS, ASCM CEP, Exercise Physiologist    Virtual Visit No    Medication changes reported     No    Fall or balance concerns reported    No    Warm-up and Cool-down Performed on first and last piece of equipment    Resistance Training Performed Yes    VAD Patient? No    PAD/SET Patient? No      Pain Assessment   Currently in Pain? No/denies              Social History   Tobacco Use  Smoking Status Never Smoker  Smokeless Tobacco Never Used    Goals Met:  Independence with exercise equipment Exercise tolerated well No report of cardiac concerns or symptoms Strength training completed today  Goals Unmet:  Not Applicable  Comments:  Jorge [name] graduated today from  rehab with 33 sessions completed.  Details of the patient's exercise prescription and what He needs to do in order to continue the prescription and progress were discussed with patient.  Patient was given a copy of prescription and goals.  Patient verbalized understanding.  Jorge [name] plans to continue to exercise by walking at home and using his elliptical.    Dr. Mark Miller is Medical Director for [facility].  Dr. Fuad Aleskerov is Medical Director for [name] Pulmonary Rehabilitation. 

## 2020-10-11 ENCOUNTER — Encounter: Payer: Self-pay | Admitting: *Deleted

## 2020-10-11 ENCOUNTER — Ambulatory Visit (INDEPENDENT_AMBULATORY_CARE_PROVIDER_SITE_OTHER): Payer: BC Managed Care – PPO

## 2020-10-11 DIAGNOSIS — Z9889 Other specified postprocedural states: Secondary | ICD-10-CM

## 2020-10-11 DIAGNOSIS — I4819 Other persistent atrial fibrillation: Secondary | ICD-10-CM | POA: Diagnosis not present

## 2020-10-11 DIAGNOSIS — Z8679 Personal history of other diseases of the circulatory system: Secondary | ICD-10-CM

## 2020-10-11 NOTE — Progress Notes (Signed)
Cardiac Individual Treatment Plan  Patient Details  Name: Jorge [name] MRN: [id number] Date of Birth: 07-18-1965 Referring Provider:   Flowsheet Row Cardiac Rehab from 05/25/2020 in [facility] Cardiac and Pulmonary Rehab  Referring Provider Thompson Grayer MD       Initial Encounter Date:  Flowsheet Row Cardiac Rehab from 05/25/2020 in [facility] Cardiac and Pulmonary Rehab  Date 05/25/20       Visit Diagnosis: Status post Maze operation for atrial fibrillation  Patient's Home Medications on Admission:  Current Outpatient Medications:    aspirin 81 MG EC tablet, Take 81 mg by mouth daily. , Disp: , Rfl:    aspirin-acetaminophen-caffeine (EXCEDRIN MIGRAINE) 250-250-65 MG tablet, Take 2 tablets by mouth every 6 (six) hours as needed for headache. , Disp: , Rfl:    azelastine (ASTELIN) 0.1 % nasal spray, Place 1 spray into both nostrils 2 (two) times daily., Disp: 30 mL, Rfl: 12   ferrous [id number] C-folic acid (TRINSICON / FOLTRIN) capsule, Take 1 capsule by mouth 2 (two) times daily after a meal., Disp: 60 capsule, Rfl: 1   fluticasone (FLONASE) 50 MCG/ACT nasal spray, Place 2 sprays into both nostrils daily., Disp: 16 g, Rfl: 3   ibuprofen (ADVIL) 200 MG tablet, Take 800 mg by mouth every 6 (six) hours as needed for headache or moderate pain., Disp: , Rfl:    montelukast (SINGULAIR) 10 MG tablet, Take 1 tablet (10 mg total) by mouth at bedtime., Disp: 90 tablet, Rfl: 3   rivaroxaban (XARELTO) 20 MG TABS tablet, As needed if afib returns. Once daily with supper. Call and let us know if you need to use it., Disp: 30 tablet, Rfl: 0   rosuvastatin (CRESTOR) 40 MG tablet, Take 1 tablet (40 mg total) by mouth daily., Disp: 90 tablet, Rfl: 3   tamsulosin (FLOMAX) 0.4 MG CAPS capsule, Take 1 capsule (0.4 mg total) by mouth daily after supper., Disp: 30 capsule, Rfl: 3   traMADol (ULTRAM) 50 MG tablet, Take 1-2 tablets (50-100 mg total) by mouth every 4 (four) hours as needed for moderate  pain., Disp: 28 tablet, Rfl: 0  Past Medical History: Past Medical History:  Diagnosis Date   Allergic rhinitis, seasonal    Anticoagulant long-term use managed by cardiology   asa and xarelto   Bilateral nephrolithiasis    urologist--- dr Junious Silk ([place] office)   Coronary artery disease cardiologist--- dr Angelena Form   nuclear stress test 10-18-2016 w/ normal perfusion;  cardiac cath 12-23-2017, PCI w/ DES to midLAD and mild RCA/ moderate CFx diease nonobstructive , normal LVF;  ETT 07-13-2019 negative for ischemia   Dysrhythmia    Afib   History of kidney stones    Hyperlipidemia    Hypertension    followed by pcp and cardiology   OA (osteoarthritis)    knees   OSA on CPAP    12-31-2019  per pt uses cpap every night   PAF (paroxysmal atrial fibrillation) ([place]) 2011   followed by dr Rayann Heman (ep)---  first dx 2011;  s/p DCCV's and ablation x2 in 2015 and last one 03/ 2018, and s/p loop recorder 05/ 2021;   failed multaq, flecainide, and amiodarone   Pneumonia    Right knee meniscal tear    S/P ablation of atrial fibrillation followed by dr Rayann Heman   x3  last one 2018   S/P drug eluting coronary stent placement 12/23/2017   x1  to midLAD   S/P minimally-invasive maze operation for atrial fibrillation 03/30/2020  Complete bilateral atrial lesion set using cryothermy and bipolar radiofrequency ablation with clipping of LA appendage via right mini thoracotomy approach   SOB (shortness of breath)    12-31-2019  per pt only when he has atrial fib , otherwise no issues with sob with activities   Status post placement of implantable loop recorder 09/08/2019   followed by dr Rayann Heman    Tobacco Use: Social History   Tobacco Use  Smoking Status Never  Smokeless Tobacco Never    Labs: Recent Review Flowsheet Data     Labs for ITP Cardiac and Pulmonary Rehab Latest Ref Rng & Units 03/30/2020 03/30/2020 03/30/2020 03/30/2020 07/25/2020   Cholestrol 100 - 199 mg/dL - - - - 151    LDLCALC 0 - 99 mg/dL - - - - 79   HDL >39 mg/dL - - - - 59   Trlycerides 0 - 149 mg/dL - - - - 68   Hemoglobin A1c 4.8 - 5.6 % - - - - -   PHART 7.350 - 7.450 - 7.328(L) 7.267(L) 7.358 -   PCO2ART 32.0 - 48.0 mmHg - 45.9 51.5(H) 34.4 -   HCO3 20.0 - 28.0 mmol/L - 24.2 23.6 19.3(L) -   TCO2 22 - 32 mmol/L 23 26 25  20(L) -   ACIDBASEDEF 0.0 - 2.0 mmol/L - 2.0 4.0(H) 6.0(H) -   O2SAT % - 99.0 92.0 90.0 -        Exercise Target Goals: Exercise Program Goal: Individual exercise prescription set using results from initial 6 min walk test and THRR while considering  patient's activity barriers and safety.   Exercise Prescription Goal: Initial exercise prescription builds to 30-45 minutes a day of aerobic activity, 2-3 days per week.  Home exercise guidelines will be given to patient during program as part of exercise prescription that the participant will acknowledge.   Education: Aerobic Exercise: - Group verbal and visual presentation on the components of exercise prescription. Introduces F.I.T.T principle from ACSM for exercise prescriptions.  Reviews F.I.T.T. principles of aerobic exercise including progression. Written material given at graduation. Flowsheet Row Cardiac Rehab from 09/21/2020 in [facility] Cardiac and Pulmonary Rehab  Date 06/15/20  Educator jh  Instruction Review Code 1- Verbalizes Understanding       Education: Resistance Exercise: - Group verbal and visual presentation on the components of exercise prescription. Introduces F.I.T.T principle from ACSM for exercise prescriptions  Reviews F.I.T.T. principles of resistance exercise including progression. Written material given at graduation. Flowsheet Row Cardiac Rehab from 09/21/2020 in [facility] Cardiac and Pulmonary Rehab  Date 06/22/20  Educator [facility]  Instruction Review Code 1- Verbalizes Understanding        Education: Exercise & Equipment Safety: - Individual verbal instruction and demonstration of equipment use and  safety with use of the equipment. Flowsheet Row Cardiac Rehab from 09/21/2020 in [facility] Cardiac and Pulmonary Rehab  Date 05/18/20  Educator [facility]  Instruction Review Code 1- Verbalizes Understanding       Education: Exercise Physiology & General Exercise Guidelines: - Group verbal and written instruction with models to review the exercise physiology of the cardiovascular system and associated critical values. Provides general exercise guidelines with specific guidelines to those with heart or lung disease.    Education: Flexibility, Balance, Mind/Body Relaxation: - Group verbal and visual presentation with interactive activity on the components of exercise prescription. Introduces F.I.T.T principle from ACSM for exercise prescriptions. Reviews F.I.T.T. principles of flexibility and balance exercise training including progression. Also discusses the mind body connection.  Reviews various relaxation techniques to help reduce and manage stress (i.e. Deep breathing, progressive muscle relaxation, and visualization). Balance handout provided to take home. Written material given at graduation.   Activity Barriers & Risk Stratification:  Activity Barriers & Cardiac Risk Stratification - 05/25/20 0928       Activity Barriers & Cardiac Risk Stratification   Activity Barriers Joint Problems;Shortness of Breath;Muscular Weakness;Other (comment);Incisional Pain    Comments bilateral knee surgery (most recent meniscus tear in 9/20200, incisional soreness, weakness in chest from incision    Cardiac Risk Stratification Moderate             6 Minute Walk:  6 Minute Walk     Row Name 05/25/20 0926 09/21/20 0811       6 Minute Walk   Phase Initial Discharge    Distance 1515 feet 1950 feet    Distance % Change -- 28.7 %    Distance Feet Change -- 435 ft    Walk Time 6 minutes 6 minutes    # of Rest Breaks 0 0    MPH 2.87 3.69    METS 4.34 5.39    RPE 10 14    Perceived Dyspnea  1.5 --    VO2  Peak 15.19 18.85    Symptoms Yes (comment) Yes (comment)    Comments incisional soreness 2/10 --    Resting HR 93 bpm 66 bpm    Resting BP 122/64 126/78    Resting Oxygen Saturation  98 % 98 %    Exercise Oxygen Saturation  during 6 min walk 96 % 91 %    Max Ex. HR 108 bpm 115 bpm    Max Ex. BP 134/64 164/62    2 Minute Post BP 108/60 --             Oxygen Initial Assessment:   Oxygen Re-Evaluation:   Oxygen Discharge (Final Oxygen Re-Evaluation):   Initial Exercise Prescription:  Initial Exercise Prescription - 05/25/20 0900       Date of Initial Exercise RX and Referring Provider   Date 05/25/20    Referring Provider Thompson Grayer MD      Treadmill   MPH 2.8    Grade 2    Minutes 15    METs 3.92      Recumbant Elliptical   Level 2    RPM 50    Minutes 15    METs 3      Elliptical   Level 1    Speed 4.4    Minutes 15      Prescription Details   Frequency (times per week) 2    Duration Progress to 30 minutes of continuous aerobic without signs/symptoms of physical distress      Intensity   THRR 40-80% of Max Heartrate 122-151    Ratings of Perceived Exertion 11-13    Perceived Dyspnea 0-4      Progression   Progression Continue to progress workloads to maintain intensity without signs/symptoms of physical distress.      Resistance Training   Training Prescription Yes    Weight 6 lb    Reps 10-15             Perform Capillary Blood Glucose checks as needed.  Exercise Prescription Changes:   Exercise Prescription Changes     Row Name 05/25/20 0900 06/05/20 1700 06/21/20 0800 06/22/20 0700 07/04/20 1300     Response to Exercise   Blood Pressure (Admit) 122/64 132/82 126/78 -- 122/64  Blood Pressure (Exercise) 134/64 122/66 136/72 -- 138/74   Blood Pressure (Exit) 108/60 112/62 116/66 -- 122/62   Heart Rate (Admit) 93 bpm 94 bpm 91 bpm -- 62 bpm   Heart Rate (Exercise) 108 bpm 120 bpm 114 bpm -- 121 bpm   Heart Rate (Exit) 96 bpm  105 bpm 102 bpm -- 101 bpm   Oxygen Saturation (Admit) 97 % -- -- -- --   Oxygen Saturation (Exercise) 96 % -- -- -- --   Rating of Perceived Exertion (Exercise) 10 13 14  -- 15   Perceived Dyspnea (Exercise) 1.5 -- -- -- --   Symptoms incisional soreness 2/10 -- none -- none   Comments walk test results second day -- -- --   Duration -- -- Continue with 30 min of aerobic exercise without signs/symptoms of physical distress. -- Continue with 30 min of aerobic exercise without signs/symptoms of physical distress.   Intensity -- -- THRR unchanged -- THRR unchanged     Progression   Progression -- Continue to progress workloads to maintain intensity without signs/symptoms of physical distress. Continue to progress workloads to maintain intensity without signs/symptoms of physical distress. -- Continue to progress workloads to maintain intensity without signs/symptoms of physical distress.   Average METs -- 4 5.55 -- 5.3     Resistance Training   Training Prescription -- Yes Yes -- Yes   Weight -- 6 lb 10 lb -- 10 lb   Reps -- 10-15 10-15 -- 10-15     Interval Training   Interval Training -- -- Yes -- Yes   Equipment -- -- Treadmill -- Treadmill   Comments -- -- 3%-8% grade 1 min -- 3%-8% grade 1 min     Treadmill   MPH -- 3.5 3.5 -- 3.5   Grade -- 2 8 -- 9.5   Minutes -- 15 15 -- 15   METs -- 4.64 7.5 -- --     Recumbant Elliptical   Level -- -- 3 -- --   Minutes -- -- 15 -- --   METs -- -- 3.6 -- --     Elliptical   Level -- -- 2 -- 5   Speed -- -- 4.4 -- --   Minutes -- -- 15 -- 15     Home Exercise Plan   Plans to continue exercise at -- -- -- Home (comment)  walking, elliptical Home (comment)  walking, elliptical   Frequency -- -- -- Add 3 additional days to program exercise sessions. Add 3 additional days to program exercise sessions.   Initial Home Exercises Provided -- -- -- 06/22/20 06/22/20    Row Name 07/18/20 1400 08/02/20 1300 08/29/20 1300 09/12/20 1500 09/27/20  1500     Response to Exercise   Blood Pressure (Admit) 140/80 108/64 122/60 122/82 142/70   Blood Pressure (Exercise) 150/60 122/58 142/66 146/68 132/78   Blood Pressure (Exit) 106/68 104/60 114/64 132/70 120/72   Heart Rate (Admit) 64 bpm 87 bpm 82 bpm 82 bpm 70 bpm   Heart Rate (Exercise) 115 bpm 128 bpm 124 bpm 113 bpm 109 bpm   Heart Rate (Exit) 100 bpm 98 bpm 107 bpm 103 bpm 98 bpm   Rating of Perceived Exertion (Exercise) 14 14 16 14 14    Symptoms none none none none none   Duration Continue with 30 min of aerobic exercise without signs/symptoms of physical distress. Continue with 30 min of aerobic exercise without signs/symptoms of physical distress. Continue with 30 min of aerobic exercise  without signs/symptoms of physical distress. Continue with 30 min of aerobic exercise without signs/symptoms of physical distress. Continue with 30 min of aerobic exercise without signs/symptoms of physical distress.   Intensity THRR unchanged THRR unchanged THRR unchanged THRR unchanged THRR unchanged     Progression   Progression Continue to progress workloads to maintain intensity without signs/symptoms of physical distress. Continue to progress workloads to maintain intensity without signs/symptoms of physical distress. Continue to progress workloads to maintain intensity without signs/symptoms of physical distress. Continue to progress workloads to maintain intensity without signs/symptoms of physical distress. Continue to progress workloads to maintain intensity without signs/symptoms of physical distress.   Average METs 7.23 9.4 9.4 6.17 6     Resistance Training   Training Prescription Yes -- Yes Yes Yes   Weight 12 lb -- 12 lb 12 lb 12   Reps 10-15 -- 10-15 10-15 10-15     Interval Training   Interval Training Yes Yes Yes Yes --   Equipment Treadmill;Elliptical;REL-XR Treadmill;Elliptical;REL-XR Treadmill;Elliptical;REL-XR Treadmill;Elliptical;REL-XR --   Comments going up to 9% grade,  interval program on elliptical going up to 9% grade, interval program on elliptical going up to 9% grade, interval program on elliptical going up to 9.5% grade, interval program on elliptical --     Treadmill   MPH 4 4.5 -- 4 --   Grade 9 8 -- 9.5 --   Minutes 15 30 -- 15 --   METs 9.8 9.41 -- 10.1 --     Recumbant Elliptical   Level -- -- -- 8 --   Minutes -- -- -- 15 --   METs -- -- -- 4.3 --     Elliptical   Level 6 -- -- 9 --   Speed 4.4 -- -- 5.9 --   Minutes 15 -- -- 15 --   METs 4.5 -- -- 4.1 --     REL-XR   Level 8 -- -- -- --   Minutes 15 -- -- -- --   METs 7.4 -- -- -- --     Home Exercise Plan   Plans to continue exercise at Home (comment)  walking, elliptical -- -- Home (comment)  walking, elliptical --   Frequency Add 3 additional days to program exercise sessions. -- -- Add 3 additional days to program exercise sessions. --   Initial Home Exercises Provided 06/22/20 -- -- 06/22/20 --            Exercise Comments:   Exercise Comments     Row Name 05/30/20 0807           Exercise Comments First full day of exercise!  Patient was oriented to gym and equipment including functions, settings, policies, and procedures.  Patient's individual exercise prescription and treatment plan were reviewed.  All starting workloads were established based on the results of the 6 minute walk test done at initial orientation visit.  The plan for exercise progression was also introduced and progression will be customized based on patient's performance and goals.                Exercise Goals and Review:   Exercise Goals     Row Name 05/25/20 0930             Exercise Goals   Increase Physical Activity Yes       Intervention Provide advice, education, support and counseling about physical activity/exercise needs.;Develop an individualized exercise prescription for aerobic and resistive training based on initial evaluation findings, risk  stratification,  comorbidities and participant's personal goals.       Expected Outcomes Short Term: Attend rehab on a regular basis to increase amount of physical activity.;Long Term: Add in home exercise to make exercise part of routine and to increase amount of physical activity.;Long Term: Exercising regularly at least 3-5 days a week.       Increase Strength and Stamina Yes       Intervention Provide advice, education, support and counseling about physical activity/exercise needs.;Develop an individualized exercise prescription for aerobic and resistive training based on initial evaluation findings, risk stratification, comorbidities and participant's personal goals.       Expected Outcomes Short Term: Increase workloads from initial exercise prescription for resistance, speed, and METs.;Long Term: Improve cardiorespiratory fitness, muscular endurance and strength as measured by increased METs and functional capacity (6MWT);Short Term: Perform resistance training exercises routinely during rehab and add in resistance training at home       Able to understand and use rate of perceived exertion (RPE) scale Yes       Intervention Provide education and explanation on how to use RPE scale       Expected Outcomes Short Term: Able to use RPE daily in rehab to express subjective intensity level;Long Term:  Able to use RPE to guide intensity level when exercising independently       Able to understand and use Dyspnea scale Yes       Intervention Provide education and explanation on how to use Dyspnea scale       Expected Outcomes Short Term: Able to use Dyspnea scale daily in rehab to express subjective sense of shortness of breath during exertion;Long Term: Able to use Dyspnea scale to guide intensity level when exercising independently       Knowledge and understanding of Target Heart Rate Range (THRR) Yes       Intervention Provide education and explanation of THRR including how the numbers were predicted and where they  are located for reference       Expected Outcomes Short Term: Able to state/look up THRR;Short Term: Able to use daily as guideline for intensity in rehab;Long Term: Able to use THRR to govern intensity when exercising independently       Able to check pulse independently Yes       Intervention Provide education and demonstration on how to check pulse in carotid and radial arteries.;Review the importance of being able to check your own pulse for safety during independent exercise       Expected Outcomes Short Term: Able to explain why pulse checking is important during independent exercise;Long Term: Able to check pulse independently and accurately       Understanding of Exercise Prescription Yes       Intervention Provide education, explanation, and written materials on patient's individual exercise prescription       Expected Outcomes Short Term: Able to explain program exercise prescription;Long Term: Able to explain home exercise prescription to exercise independently                Exercise Goals Re-Evaluation :  Exercise Goals Re-Evaluation     Row Name 05/30/20 [phone] 06/05/20 1710 06/21/20 0841 06/22/20 0737 06/29/20 0722     Exercise Goal Re-Evaluation   Exercise Goals Review Understanding of Exercise Prescription;Knowledge and understanding of Target Heart Rate Range (THRR);Able to understand and use rate of perceived exertion (RPE) scale;Able to understand and use Dyspnea scale -- Increase Physical Activity;Increase Strength and Stamina;Understanding of Exercise  Prescription Increase Physical Activity;Increase Strength and Stamina;Understanding of Exercise Prescription;Able to understand and use rate of perceived exertion (RPE) scale;Able to understand and use Dyspnea scale;Knowledge and understanding of Target Heart Rate Range (THRR);Able to check pulse independently Increase Physical Activity;Increase Strength and Stamina;Understanding of Exercise Prescription   Comments Reviewed RPE  and dyspnea scales, THR and program prescription with pt today.  Pt voiced understanding and was given a copy of goals to take home. Jorge [name] has tolerated exercise well in his first 2 sessions.  We will monitor progress. Jorge [name] is doing well in rehab.  He is now doing intervals on the treadmill using the incline up to 8%.  We will continue to monitor his progress. Reviewed home exercise with pt today.  Pt plans to continue walking and using his elliptical at home for exercise.  Reviewed THR, pulse, RPE, sign and symptoms, pulse oximetery and when to call 911 or MD.  Also discussed weather considerations and indoor options.  Pt voiced understanding. Hao is doing well in rehab.  He is exercising on his off days by walking and using his elliptical.  He was able to help someone move the other day.  He has plans to go play golf today.   Expected Outcomes Short: Use RPE daily to regulate intensity. Long: Follow program prescription in THR. Short: attend consistently Long: improve MET level Short: Continue to add in intervals and work up to them on elliptical  Long: Continue to improve stamina. Short: Continue to walk daily but focus on heart rate  Long: Continue to improve stamina. Short: Continue to get in exercise each day Long: Continue to improve stamina.    [place] Name 07/04/20 1309 07/18/20 1446 07/25/20 0733 08/02/20 1315 08/29/20 0726     Exercise Goal Re-Evaluation   Exercise Goals Review Increase Physical Activity;Increase Strength and Stamina Increase Physical Activity;Increase Strength and Stamina;Understanding of Exercise Prescription Increase Physical Activity;Increase Strength and Stamina;Understanding of Exercise Prescription Increase Physical Activity;Increase Strength and Stamina Increase Physical Activity;Increase Strength and Stamina   Comments Jorge [name] is progressing well.  he goes up to 9.5% incline on the TM and level 5 on elliptical.  We will continue to monitor progress. Jorge [name] is about half way  through the program and doing well.  He continues to do his intervals each day.  He is now up to 12 lb weights.  We will continue to monitor his progress. Jorge [name] is doing well with exercising at home 3.25 miles per day (walking at a faster pace - 16 pace) - RPE 14. Plays basketball and golf. 200 push-ups, flutter kicks, curls, shoulder lifts, squats 4x/week - with 20lb weights. Jorge [name] continues to progress well and does intervals on TM.  We will continue to monitor progress. Jorge [name] is doing great. He continues to exercise 3 days per week of exercise on top of rehab days, concluding a total of 5 days . He is walking 2.5-3.5 miles for aerobic/, accumulating about 40 minutes; Continues to do  push ups, sit ups, pull ups, squats, most exercises that we do in class. No complaints and is tolerating everything well. He checks his HR and find that he is in his range most times.   Expected Outcomes Short: continue consistent exercise Long:  increase MET level Short: Continue with intervals Long: Continue to improve stamina. Short: Continue with intervals in gym, continue exercising at home Long: Continue to improve stamina. Short: maintain current exercise schedule Long:  build overall stamina Short: Continue checking HR during exercise Continue Long:  Continue appropriate exercise prescription at home    [place] Name 09/12/20 1552 09/26/20 0827           Exercise Goal Re-Evaluation   Exercise Goals Review Increase Physical Activity;Increase Strength and Stamina;Understanding of Exercise Prescription Increase Physical Activity;Increase Strength and Stamina;Understanding of Exercise Prescription      Comments Jorge [name] has been doing well in rehab.  He is nearing graduation and will be needing his post 6MWT soon and we expect to see an improvement.  He continues to push himself and is now up to 9.5% grade!  We will continue to montior his progress. Jorge [name] is getting ready to graduate- he completed his 6MWT and improved by  almost 29%! He sees a big difference from the first time he started. He continues to walk at about 3 miles at a time, alternating days with his bike. Continues to do weights and bodyweight for resistance training. He checks his HR, though he says he struggles with reaching his THR sometimes. Encouraged to use RPE scale and modify intensity as long as he feels good and HR is appropriate. He will continue to exercise at home after he graduates.      Expected Outcomes Short: Improve post 6MWT Long: Continue to improve stamina Short: Graduate Long: Continue to exercise at home at appropriate exercise prescription               Discharge Exercise Prescription (Final Exercise Prescription Changes):  Exercise Prescription Changes - 09/27/20 1500       Response to Exercise   Blood Pressure (Admit) 142/70    Blood Pressure (Exercise) 132/78    Blood Pressure (Exit) 120/72    Heart Rate (Admit) 70 bpm    Heart Rate (Exercise) 109 bpm    Heart Rate (Exit) 98 bpm    Rating of Perceived Exertion (Exercise) 14    Symptoms none    Duration Continue with 30 min of aerobic exercise without signs/symptoms of physical distress.    Intensity THRR unchanged      Progression   Progression Continue to progress workloads to maintain intensity without signs/symptoms of physical distress.    Average METs 6      Resistance Training   Training Prescription Yes    Weight 12    Reps 10-15             Nutrition:  Target Goals: Understanding of nutrition guidelines, daily intake of sodium <1533m, cholesterol <2023m calories 30% from fat and 7% or less from saturated fats, daily to have 5 or more servings of fruits and vegetables.  Education: All About Nutrition: -Group instruction provided by verbal, written material, interactive activities, discussions, models, and posters to present general guidelines for heart healthy nutrition including fat, fiber, MyPlate, the role of sodium in heart healthy  nutrition, utilization of the nutrition label, and utilization of this knowledge for meal planning. Follow up email sent as well. Written material given at graduation. [facility]  Education need identified 05/24/20  Date 07/06/20  Educator MC[employer] Camp[employer]ruction Review Code 1- Verbalizes Understanding       Biometrics:  Pre Biometrics - 05/25/20 0931       Pre Biometrics   Height 5' 10.5" (1.791 m)    Weight 190 lb 3.2 oz (86.3 kg)    BMI (Calculated) 26.9    Single Leg Stand 28.8 seconds             Post  Biometrics - 09/21/20 2482        Post  Biometrics   Height 5' 10.5" (1.791 m)    Weight 194 lb 9.6 oz (88.3 kg)    BMI (Calculated) 27.52    Single Leg Stand 30 seconds             Nutrition Therapy Plan and Nutrition Goals:  Nutrition Therapy & Goals - 05/25/20 0925       Nutrition Therapy   Diet Heart healthy, low Na    Drug/Food Interactions Statins/Certain Fruits    Protein (specify units) 70g    Fiber 30 grams    Whole Grain Foods 3 servings    Saturated Fats 12 max. grams    Fruits and Vegetables 8 servings/day    Sodium 1.5 grams      Personal Nutrition Goals   Nutrition Goal ST: check almond milk for fortification, reduce sodium using some of the techniques discussed (citrus, vinegar, etc), increase variety, try "nice" cream LT: fiber 30g/day, 8 fruits and vegetables per day    Comments He reports limiting red meat, eating pork chops 1x/month, chicken and fish mostly. He reports eating whole grains and having a garden of his own vegetables he eats over the summer. he drinks almond milk or 2% milk. He likes oikos yogurt. For breakfast he will have oatmeal or grits with fruit and ground flax; on the weekned he will have bacon and eggs. L: sandwich ([place] or peanut butter and jelly) D: chicken, fish more during the summer. During the winter they eat more crockpot meals like chili. Vegetables:  leafy greens, string beans, butter beans, peas. Fruit: bananas, apples, clementines, fruit medley. S: ice cream or some fruit. Discussed heart healhty eating. Recommended trying "nice" cream (made with frozen bananas).      Intervention Plan   Intervention Prescribe, educate and counsel regarding individualized specific dietary modifications aiming towards targeted core components such as weight, hypertension, lipid management, diabetes, heart failure and other comorbidities.;Nutrition handout(s) given to patient.    Expected Outcomes Short Term Goal: Understand basic principles of dietary content, such as calories, fat, sodium, cholesterol and nutrients.;Short Term Goal: A plan has been developed with personal nutrition goals set during dietitian appointment.;Long Term Goal: Adherence to prescribed nutrition plan.             Nutrition Assessments:  MEDIFICTS Score Key: ?70 Need to make dietary changes  40-70 Heart Healthy Diet ? 40 Therapeutic Level Cholesterol Diet  Flowsheet Row Cardiac Rehab from 05/25/2020 in [facility] Cardiac and Pulmonary Rehab  Picture Your Plate Total Score on Admission 83      Picture Your Plate Scores: <50 Unhealthy dietary pattern with much room for improvement. 41-50 Dietary pattern unlikely to meet recommendations for good health and room for improvement. 51-60 More healthful dietary pattern, with some room for improvement.  >60 Healthy dietary pattern, although there may be some specific behaviors that could be improved.    Nutrition Goals Re-Evaluation:  Nutrition Goals Re-Evaluation     Row Name 06/29/20 0725 07/25/20 0738 08/29/20 0800 09/26/20 0856       Goals   Nutrition Goal ST: check almond milk for fortification, reduce sodium using some of the techniques discussed (citrus, vinegar, etc), increase variety, try "nice" cream LT: fiber 30g/day, 8 fruits and vegetables per day ST: Try carbohydrate snack before workout and carbohydrate/protein snack  post workout. LT: continue with heart healthy changes ST: Try carbohydrate snack before workout and carbohydrate/protein snack post workout. LT:  continue with heart healthy changes ST: Try carbohydrate snack before workout and carbohydrate/protein snack post workout. LT: continue with heart healthy changes    Comment Jorge [name] is doing well with his diet.  He eats lots of fish and chicken.  He gets lots of vegetables.  He has not had a steak in over a month.  He is using almond milk some now with his cereal.  He is trying to watch his sodium more. He reports doing well with his nutrition. He likes chicken and fish, he likes pasta and potatoes. B: oatmeal with fruit and honey - sometimes he will have grits. He reports following MyPlate guidelines when eating and referring to his handouts given by RD. Continue to increase variety. Try carbohydrate snack before workout and carbohydrate/protein snack post workout. Jorge [name] is doing well with his diet, he does not wish to start any new goals at this time. He does admit to drinking a little more than usual, which is increasing his weight and caloric intake. It tends to connect with social events such as golfing and hanging with friends. We talked about importance of staying hydrating and monitoring his weight. Goal is to be back down around 190 lb. Larri has been consistent with his weight. He reports that his goal is to stay under 190 lb, and right now he is about 5 lbs over his goals. He is trying to eliminate red meat from his diet and focus on fish and chicken for protein options. He is making sure he is eating enough to replenish his workouts. He is about to graduate and feels confident in his diet to continue what he is doing. Does not feel that he needs any new goals at this time.    Expected Outcome Short: Continue to watch sodium Long; Continue to eat healthier Short: Continue to watch sodium Long; Continue to eat healthier Short: Monitor weight Long: Continue heart  healthy diet Short: Continue to monitor weight and graduate Long: Maintain hearty healthy diet for long-term             Nutrition Goals Discharge (Final Nutrition Goals Re-Evaluation):  Nutrition Goals Re-Evaluation - 09/26/20 0856       Goals   Nutrition Goal ST: Try carbohydrate snack before workout and carbohydrate/protein snack post workout. LT: continue with heart healthy changes    Comment Jorge [name] has been consistent with his weight. He reports that his goal is to stay under 190 lb, and right now he is about 5 lbs over his goals. He is trying to eliminate red meat from his diet and focus on fish and chicken for protein options. He is making sure he is eating enough to replenish his workouts. He is about to graduate and feels confident in his diet to continue what he is doing. Does not feel that he needs any new goals at this time.    Expected Outcome Short: Continue to monitor weight and graduate Long: Maintain hearty healthy diet for long-term             Psychosocial: Target Goals: Acknowledge presence or absence of significant depression and/or stress, maximize coping skills, provide positive support system. Participant is able to verbalize types and ability to use techniques and skills needed for reducing stress and depression.   Education: Stress, Anxiety, and Depression - Group verbal and visual presentation to define topics covered.  Reviews how body is impacted by stress, anxiety, and depression.  Also discusses healthy ways to reduce stress and to treat/manage anxiety  and depression.  Written material given at graduation. Flowsheet Row Cardiac Rehab from [facility]/21/2020 in [facility]  Education need identified 05/24/20  Date 08/03/20  Educator [facility]  Instruction Review Code 1- [employer] Understanding       Education: Sleep Hygiene -Provides group verbal and written instruction about how sleep can affect your health.  Define sleep hygiene, discuss  sleep cycles and impact of sleep habits. Review good sleep hygiene tips.    Initial Review & Psychosocial Screening:  Initial Psych Review & Screening - 05/18/20 1013       Initial Review   Current issues with None Identified      Family Dynamics   Good Support System? Yes    Comments He can look to his son and his wife for support. He has a good outlook on his health after his procedure and is ready to start exercising.      Barriers   Psychosocial barriers to participate in program There are no identifiable barriers or psychosocial needs.      Screening Interventions   Interventions To provide support and resources with identified psychosocial needs;Provide feedback about the scores to participant    Expected Outcomes Short Term goal: Utilizing psychosocial counselor, staff and physician to assist with identification of specific Stressors or current issues interfering with healing process. Setting desired goal for each stressor or current issue identified.;Long Term Goal: Stressors or current issues are controlled or eliminated.;Short Term goal: Identification and review with participant of any Quality of Life or Depression concerns found by scoring the questionnaire.;Long Term goal: The participant improves quality of Life and PHQ9 Scores as seen by post scores and/or verbalization of changes             Quality of Life Scores:   Quality of Life - 05/24/20 1313       Quality of Life   Select Quality of Life      Quality of Life Scores   Health/Function Pre 25.07 %    Socioeconomic Pre 26.25 %    Psych/Spiritual Pre 28.07 %    Family Pre 28.8 %    GLOBAL Pre 26.47 %            Scores of 19 and below usually indicate a poorer quality of life in these areas.  A difference of  2-3 points is a clinically meaningful difference.  A difference of 2-3 points in the total score of the Quality of Life Index has been associated with significant improvement in overall quality of  life, self-image, physical symptoms, and general health in studies assessing change in quality of life.  PHQ-9: Recent Review Flowsheet Data     Depression screen [place] 2/9 07/25/2020 05/25/2020 04/06/2019 07/30/2018 06/29/2018   Decreased Interest 0 0 0 0 0   Down, Depressed, Hopeless 0 0 0 0 0   PHQ - 2 Score 0 0 0 0 0   Altered sleeping 0 1 0 - -   Tired, decreased energy 0 0 0 - -   Change in appetite 0 0 0 - -   Feeling bad or failure about yourself  0 0 0 - -   Trouble concentrating 0 0 0 - -   Moving slowly or fidgety/restless 0 0 0 - -   Suicidal thoughts 0 0 0 - -   PHQ-9 Score 0 1 0 - -   Difficult doing work/chores Not difficult at all Not difficult at all Not difficult at all - -  Interpretation of Total Score  Total Score Depression Severity:  1-4 = Minimal depression, 5-9 = Mild depression, 10-14 = Moderate depression, 15-19 = Moderately severe depression, 20-27 = Severe depression   Psychosocial Evaluation and Intervention:  Psychosocial Evaluation - 05/18/20 1014       Psychosocial Evaluation & Interventions   Interventions Encouraged to exercise with the program and follow exercise prescription;Relaxation education;Stress management education    Comments He can look to his son and his wife for support. He has a good outlook on his health after his procedure and is ready to start exercising.    Expected Outcomes Short: Exercise regularly to support mental health and notify staff of any changes. Long: maintain mental health and well being through teaching of rehab or prescribed medications independently.    Continue Psychosocial Services  Follow up required by staff             Psychosocial Re-Evaluation:  Psychosocial Re-Evaluation     [place] Name 06/29/20 [phone] 07/25/20 0736 08/29/20 0740 09/26/20 0739       Psychosocial Re-Evaluation   Current issues with None Identified -- None Identified None Identified    Comments Jorge [name] is doing well in rehab. He feels  good mentally and declines any major stressors.  He sleeps well most days.  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 Jorge [name] is doing well in rehab. He feels good mentally and declines any major stressors. He sleeps well most days. He plays golf and basketball - he also likes reading. 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 Jorge [name] is doing well mentally. Has good support from his family, especially including his wife. Sleep is good and has no complainnts. He is not on any medications for mental health and denies any anxiety or depression. He stays busy golfing with his friends and hanging out with them. Staying compliant with all his other medications. Jorge [name] is about to graduate. We gave him "back his structure that he needed" and feels confident exercising at home after graduation. Jorge [name] continues to stay busy with social events. He goes to the beach a lot and is excited to continue that now that the weather is getting nicer. Denies any big complaints or stressors. Staying compliant with medications and looking forward to graduating.    Expected Outcomes Short: Continue to make time for exercise Long: Continue to stay positive. Short: Continue stress management Long: Continue to stay positive. Short: Continue attendance with rehab Long: Maintain positive attitude Short: Graduate Long: Continue positive attitude and use exercise to help with stress management    Interventions Encouraged to attend Cardiac Rehabilitation for the exercise Encouraged to attend Cardiac Rehabilitation for the exercise Encouraged to attend Cardiac Rehabilitation for the exercise Encouraged to attend Cardiac Rehabilitation for the exercise    Continue Psychosocial Services  Follow up required by staff Follow up required by staff Follow up required by staff Follow up required by staff              Psychosocial Discharge (Final Psychosocial Re-Evaluation):  Psychosocial Re-Evaluation - 09/26/20 0739       Psychosocial Re-Evaluation   Current issues with None Identified    Comments Jorge [name] is about to graduate. We gave him "back his structure that he needed" and feels confident exercising at home after graduation.  Jorge [name] continues to stay busy with social events. He goes to the beach a lot and is excited to continue that now that the weather is getting nicer. Denies any big complaints or stressors. Staying compliant with medications and looking forward to graduating.    Expected Outcomes Short: Graduate Long: Continue positive attitude and use exercise to help with stress management    Interventions Encouraged to attend Cardiac Rehabilitation for the exercise    Continue Psychosocial Services  Follow up required by staff             Vocational Rehabilitation: Provide vocational rehab assistance to qualifying candidates.   Vocational Rehab Evaluation & Intervention:   Education: Education Goals: Education classes will be provided on a variety of topics geared toward better understanding of heart health and risk factor modification. Participant will state understanding/return demonstration of topics presented as noted by education test scores.  Learning Barriers/Preferences:  Learning Barriers/Preferences - 05/18/20 1009       Learning Barriers/Preferences   Learning Barriers None    Learning Preferences None             General Cardiac Education Topics:  AED/CPR: - Group verbal and written instruction with the use of models to demonstrate the basic use of the AED with the basic ABC's of resuscitation.   Anatomy and Cardiac Procedures: - Group verbal and visual presentation and models provide information about basic cardiac anatomy and function. Reviews the testing methods done to diagnose heart disease and the outcomes of the test results. Describes the  treatment choices: Medical Management, Angioplasty, or Coronary Bypass Surgery for treating various heart conditions including Myocardial Infarction, Angina, Valve Disease, and Cardiac Arrhythmias.  Written material given at graduation. Flowsheet Row Cardiac Rehab from 09/21/2020 in [facility] Cardiac and Pulmonary Rehab  Date 06/22/20  Educator SB  Instruction Review Code 1- Verbalizes Understanding       Medication Safety: - Group verbal and visual instruction to review commonly prescribed medications for heart and lung disease. Reviews the medication, class of the drug, and side effects. Includes the steps to properly store meds and maintain the prescription regimen.  Written material given at graduation. Flowsheet Row Cardiac Rehab from 09/21/2020 in [facility] Cardiac and Pulmonary Rehab  Date 07/13/20  Educator SB  Instruction Review Code 1- Verbalizes Understanding       Intimacy: - Group verbal instruction through game format to discuss how heart and lung disease can affect sexual intimacy. Written material given at graduation.. Flowsheet Row Cardiac Rehab from 09/21/2020 in [facility] Cardiac and Pulmonary Rehab  Date 06/15/20  Educator jh  Instruction Review Code 1- Verbalizes Understanding       Know Your Numbers and Heart Failure: - Group verbal and visual instruction to discuss disease risk factors for cardiac and pulmonary disease and treatment options.  Reviews associated critical values for Overweight/Obesity, Hypertension, Cholesterol, and Diabetes.  Discusses basics of heart failure: signs/symptoms and treatments.  Introduces Heart Failure Zone chart for action plan for heart failure.  Written material given at graduation. Flowsheet Row Cardiac Rehab from 09/21/2020 in [facility] Cardiac and Pulmonary Rehab  Date 09/21/20  Educator SB  Instruction Review Code 1- Verbalizes Understanding       Infection Prevention: - Provides verbal and written material to individual with discussion of  infection control including proper hand washing and proper equipment cleaning during exercise session. Flowsheet Row Cardiac Rehab from 09/21/2020 in [facility] Cardiac and Pulmonary Rehab  Date 05/18/20  Educator [facility]  Instruction Review  Code 1- Verbalizes Understanding       Falls Prevention: - Provides verbal and written material to individual with discussion of falls prevention and safety. Flowsheet Row Cardiac Rehab from 09/21/2020 in [facility] Cardiac and Pulmonary Rehab  Date 05/18/20  Educator [facility]  Instruction Review Code 1- Verbalizes Understanding       Other: -Provides group and verbal instruction on various topics (see comments)   Knowledge Questionnaire Score:  Knowledge Questionnaire Score - 05/24/20 1313       Knowledge Questionnaire Score   Pre Score 23/26 Education Focus: Depression, nutrition             Core Components/Risk Factors/Patient Goals at Admission:  Personal Goals and Risk Factors at Admission - 05/25/20 0932       Core Components/Risk Factors/Patient Goals on Admission    Weight Management Yes;Weight Loss    Intervention Weight Management: Develop a combined nutrition and exercise program designed to reach desired caloric intake, while maintaining appropriate intake of nutrient and fiber, sodium and fats, and appropriate energy expenditure required for the weight goal.;Weight Management: Provide education and appropriate resources to help participant work on and attain dietary goals.;Weight Management/Obesity: Establish reasonable short term and long term weight goals.    Admit Weight 190 lb 3.2 oz (86.3 kg)    Goal Weight: Short Term 185 lb (83.9 kg)    Goal Weight: Long Term 185 lb (83.9 kg)    Expected Outcomes Short Term: Continue to assess and modify interventions until short term weight is achieved;Long Term: Adherence to nutrition and physical activity/exercise program aimed toward attainment of established weight goal;Understanding recommendations  for meals to include 15-35% energy as protein, 25-35% energy from fat, 35-60% energy from carbohydrates, less than 275m of dietary cholesterol, 20-35 gm of total fiber daily;Understanding of distribution of calorie intake throughout the day with the consumption of 4-5 meals/snacks;Weight Loss: Understanding of general recommendations for a balanced deficit meal plan, which promotes 1-2 lb weight loss per week and includes a negative energy balance of [phone] kcal/d;Weight Maintenance: Understanding of the daily nutrition guidelines, which includes 25-35% calories from fat, 7% or less cal from saturated fats, less than 2071mcholesterol, less than 1.5gm of sodium, & 5 or more servings of fruits and vegetables daily    Hypertension Yes    Intervention Provide education on lifestyle modifcations including regular physical activity/exercise, weight management, moderate sodium restriction and increased consumption of fresh fruit, vegetables, and low fat dairy, alcohol moderation, and smoking cessation.;Monitor prescription use compliance.    Expected Outcomes Short Term: Continued assessment and intervention until BP is < 140/9059mG in hypertensive participants. < 130/74m55m in hypertensive participants with diabetes, heart failure or chronic kidney disease.;Long Term: Maintenance of blood pressure at goal levels.    Lipids Yes    Intervention Provide education and support for participant on nutrition & aerobic/resistive exercise along with prescribed medications to achieve LDL <70mg3mL >40mg.41mExpected Outcomes Short Term: Participant states understanding of desired cholesterol values and is compliant with medications prescribed. Participant is following exercise prescription and nutrition guidelines.;Long Term: Cholesterol controlled with medications as prescribed, with individualized exercise RX and with personalized nutrition plan. Value goals: LDL < 70mg, 54m> 40 mg.              Education:Diabetes - Individual verbal and written instruction to review signs/symptoms of diabetes, desired ranges of glucose level fasting, after meals and with exercise. Acknowledge that pre and post exercise glucose checks  will be done for 3 sessions at entry of program.   Core Components/Risk Factors/Patient Goals Review:   Goals and Risk Factor Review     Row Name 06/29/20 0726 07/25/20 0741 08/29/20 0733 09/26/20 0900       Core Components/Risk Factors/Patient Goals Review   Personal Goals Review Weight Management/Obesity;Hypertension Weight Management/Obesity;Hypertension;Improve shortness of breath with ADL's Weight Management/Obesity;Hypertension Weight Management/Obesity;Hypertension    Review Truxton is doing well in rehab.  His weight was 188 lb today.  He tries to keep it around 185 lb at home.  Blood pressures have been good in class and he keeps an eye on them at home.  He is pleased with where things are currently. Kreed is doing well in rehab.  His weight was 189 lb today. He tries to keep it around 185 lb at home - he tries to saty between 185 and 190lbs. He checks his BP at home (<115/<75 in am,130/75-80 in pm)  this morning 108/64. He reports doctors took him off all BP medication December 2nd. He reports he would also like to manage his shortness of breath recovery. BP has been stable at rehab, at home it can range [id number] systolic. He is currently not on any BP meds and his doctor is pleased with his numbers. Has cuff at home to take it and makes sure he checks it, especially if he ever feels off. Weight has been up, however, admits to intaking more excessive calories. He is continuing to watch  his weight and plans to move a little more to help lose those last couple of pounds. Jevon is continuing to do well. He is getting ready to graduate. BP has been stable so far, he continues to check it at home, systolic goes no higher than 130. His weight has been stagnant, he would  like to be under 190 lb, and right now he continues to stay around 192-195 lb. He pays attention to his diet, though sometimes had additional calories from drinking. He is ready to graduate and ready to continue what he is doing.    Expected Outcomes Short: Continue to maintain weight Long: Continue to monitor risk factors Short: Continue to maintain weight Long: Continue to monitor risk factors Short: Continue to monitor weight Long: Manage lifestyle risk factors Short: Graduate Long: Continue to manage life style risk factors at home             Core Components/Risk Factors/Patient Goals at Discharge (Final Review):   Goals and Risk Factor Review - 09/26/20 0900       Core Components/Risk Factors/Patient Goals Review   Personal Goals Review Weight Management/Obesity;Hypertension    Review Jorge [name] is continuing to do well. He is getting ready to graduate. BP has been stable so far, he continues to check it at home, systolic goes no higher than 130. His weight has been stagnant, he would like to be under 190 lb, and right now he continues to stay around 192-195 lb. He pays attention to his diet, though sometimes had additional calories from drinking. He is ready to graduate and ready to continue what he is doing.    Expected Outcomes Short: Graduate Long: Continue to manage life style risk factors at home             ITP Comments:  ITP Comments     Row Name 05/18/20 1009 05/25/20 0926 05/30/20 0807 06/21/20 0610 07/19/20 0713   ITP Comments Virtual Visit completed. Patient informed on EP and RD appointment  and 6 Minute walk test. Patient also informed of patient health questionnaires on My Chart. Patient Verbalizes understanding. Visit diagnosis can be found in [facility] 05/01/2020. Completed 6MWT and gym orientation. Initial ITP created and sent for review to Dr. Emily Filbert, Medical Director. First full day of exercise!  Patient was oriented to gym and equipment including functions, settings,  policies, and procedures.  Patient's individual exercise prescription and treatment plan were reviewed.  All starting workloads were established based on the results of the 6 minute walk test done at initial orientation visit.  The plan for exercise progression was also introduced and progression will be customized based on patient's performance and goals. 30 Day review completed. Medical Director ITP review done, changes made as directed, and signed approval by Medical Director. 30 Day review completed. Medical Director ITP review done, changes made as directed, and signed approval by Medical Director.    [place] Name 08/16/20 0904 09/13/20 0615 10/03/20 0724 10/11/20 1142     ITP Comments 30 Day review completed. Medical Director ITP review done, changes made as directed, and signed approval by Medical Director. 30 Day review completed. Medical Director ITP review done, changes made as directed, and signed approval by Medical Director. Shafter graduated today from  rehab with 33 sessions completed.  Details of the patient's exercise prescription and what He needs to do in order to continue the prescription and progress were discussed with patient.  Patient was given a copy of prescription and goals.  Patient verbalized understanding.  Jorge [name] plans to continue to exercise by walking at home and using his elliptical. Deny graduated today from  rehab with 33 sessions completed.  Details of the patient's exercise prescription and what He needs to do in order to continue the prescription and progress were discussed with patient.  Patient was given a copy of prescription and goals.  Patient verbalized understanding.  Jorge [name] plans to continue to exercise by walking at home and using his elliptical.             Comments: discharge ITP

## 2020-10-13 LAB — CUP PACEART REMOTE DEVICE CHECK
Date Time Interrogation Session: 20220617065826
Implantable Pulse Generator Implant Date: 20210512

## 2020-10-17 NOTE — Progress Notes (Signed)
Cardiac Individual Treatment Plan  Patient Details  Name: Jorge [name] MRN: [id number] Date of Birth: 02/14/1966 Referring Provider:   Flowsheet Row Cardiac Rehab from 05/25/2020 in [facility] Cardiac and Pulmonary Rehab  Referring Provider Thompson Grayer MD       Initial Encounter Date:  Flowsheet Row Cardiac Rehab from 05/25/2020 in [facility] Cardiac and Pulmonary Rehab  Date 05/25/20       Visit Diagnosis: Status post Maze operation for atrial fibrillation  Patient's Home Medications on Admission:  Current Outpatient Medications:    aspirin 81 MG EC tablet, Take 81 mg by mouth daily. , Disp: , Rfl:    aspirin-acetaminophen-caffeine (EXCEDRIN MIGRAINE) 250-250-65 MG tablet, Take 2 tablets by mouth every 6 (six) hours as needed for headache. , Disp: , Rfl:    azelastine (ASTELIN) 0.1 % nasal spray, Place 1 spray into both nostrils 2 (two) times daily., Disp: 30 mL, Rfl: 12   ferrous [id number] C-folic acid (TRINSICON / FOLTRIN) capsule, Take 1 capsule by mouth 2 (two) times daily after a meal., Disp: 60 capsule, Rfl: 1   fluticasone (FLONASE) 50 MCG/ACT nasal spray, Place 2 sprays into both nostrils daily., Disp: 16 g, Rfl: 3   ibuprofen (ADVIL) 200 MG tablet, Take 800 mg by mouth every 6 (six) hours as needed for headache or moderate pain., Disp: , Rfl:    montelukast (SINGULAIR) 10 MG tablet, Take 1 tablet (10 mg total) by mouth at bedtime., Disp: 90 tablet, Rfl: 3   rivaroxaban (XARELTO) 20 MG TABS tablet, As needed if afib returns. Once daily with supper. Call and let us know if you need to use it., Disp: 30 tablet, Rfl: 0   rosuvastatin (CRESTOR) 40 MG tablet, Take 1 tablet (40 mg total) by mouth daily., Disp: 90 tablet, Rfl: 3   tamsulosin (FLOMAX) 0.4 MG CAPS capsule, Take 1 capsule (0.4 mg total) by mouth daily after supper., Disp: 30 capsule, Rfl: 3   traMADol (ULTRAM) 50 MG tablet, Take 1-2 tablets (50-100 mg total) by mouth every 4 (four) hours as needed for moderate  pain., Disp: 28 tablet, Rfl: 0  Past Medical History: Past Medical History:  Diagnosis Date   Allergic rhinitis, seasonal    Anticoagulant long-term use managed by cardiology   asa and xarelto   Bilateral nephrolithiasis    urologist--- dr Junious Silk ([place] office)   Coronary artery disease cardiologist--- dr Angelena Form   nuclear stress test 10-18-2016 w/ normal perfusion;  cardiac cath 12-23-2017, PCI w/ DES to midLAD and mild RCA/ moderate CFx diease nonobstructive , normal LVF;  ETT 07-13-2019 negative for ischemia   Dysrhythmia    Afib   History of kidney stones    Hyperlipidemia    Hypertension    followed by pcp and cardiology   OA (osteoarthritis)    knees   OSA on CPAP    12-31-2019  per pt uses cpap every night   PAF (paroxysmal atrial fibrillation) ([place]) 2011   followed by dr Rayann Heman (ep)---  first dx 2011;  s/p DCCV's and ablation x2 in 2015 and last one 03/ 2018, and s/p loop recorder 05/ 2021;   failed multaq, flecainide, and amiodarone   Pneumonia    Right knee meniscal tear    S/P ablation of atrial fibrillation followed by dr Rayann Heman   x3  last one 2018   S/P drug eluting coronary stent placement 12/23/2017   x1  to midLAD   S/P minimally-invasive maze operation for atrial fibrillation 03/30/2020  Complete bilateral atrial lesion set using cryothermy and bipolar radiofrequency ablation with clipping of LA appendage via right mini thoracotomy approach   SOB (shortness of breath)    12-31-2019  per pt only when he has atrial fib , otherwise no issues with sob with activities   Status post placement of implantable loop recorder 09/08/2019   followed by dr Rayann Heman    Tobacco Use: Social History   Tobacco Use  Smoking Status Never  Smokeless Tobacco Never    Labs: Recent Review Flowsheet Data     Labs for ITP Cardiac and Pulmonary Rehab Latest Ref Rng & Units 03/30/2020 03/30/2020 03/30/2020 03/30/2020 07/25/2020   Cholestrol 100 - 199 mg/dL - - - - 151    LDLCALC 0 - 99 mg/dL - - - - 79   HDL >39 mg/dL - - - - 59   Trlycerides 0 - 149 mg/dL - - - - 68   Hemoglobin A1c 4.8 - 5.6 % - - - - -   PHART 7.350 - 7.450 - 7.328(L) 7.267(L) 7.358 -   PCO2ART 32.0 - 48.0 mmHg - 45.9 51.5(H) 34.4 -   HCO3 20.0 - 28.0 mmol/L - 24.2 23.6 19.3(L) -   TCO2 22 - 32 mmol/L 23 26 25  20(L) -   ACIDBASEDEF 0.0 - 2.0 mmol/L - 2.0 4.0(H) 6.0(H) -   O2SAT % - 99.0 92.0 90.0 -        Exercise Target Goals: Exercise Program Goal: Individual exercise prescription set using results from initial 6 min walk test and THRR while considering  patient's activity barriers and safety.   Exercise Prescription Goal: Initial exercise prescription builds to 30-45 minutes a day of aerobic activity, 2-3 days per week.  Home exercise guidelines will be given to patient during program as part of exercise prescription that the participant will acknowledge.   Education: Aerobic Exercise: - Group verbal and visual presentation on the components of exercise prescription. Introduces F.I.T.T principle from ACSM for exercise prescriptions.  Reviews F.I.T.T. principles of aerobic exercise including progression. Written material given at graduation. Flowsheet Row Cardiac Rehab from 09/21/2020 in [facility] Cardiac and Pulmonary Rehab  Date 06/15/20  Educator jh  Instruction Review Code 1- Verbalizes Understanding       Education: Resistance Exercise: - Group verbal and visual presentation on the components of exercise prescription. Introduces F.I.T.T principle from ACSM for exercise prescriptions  Reviews F.I.T.T. principles of resistance exercise including progression. Written material given at graduation. Flowsheet Row Cardiac Rehab from 09/21/2020 in [facility] Cardiac and Pulmonary Rehab  Date 06/22/20  Educator [facility]  Instruction Review Code 1- Verbalizes Understanding        Education: Exercise & Equipment Safety: - Individual verbal instruction and demonstration of equipment use and  safety with use of the equipment. Flowsheet Row Cardiac Rehab from 09/21/2020 in [facility] Cardiac and Pulmonary Rehab  Date 05/18/20  Educator [facility]  Instruction Review Code 1- Verbalizes Understanding       Education: Exercise Physiology & General Exercise Guidelines: - Group verbal and written instruction with models to review the exercise physiology of the cardiovascular system and associated critical values. Provides general exercise guidelines with specific guidelines to those with heart or lung disease.    Education: Flexibility, Balance, Mind/Body Relaxation: - Group verbal and visual presentation with interactive activity on the components of exercise prescription. Introduces F.I.T.T principle from ACSM for exercise prescriptions. Reviews F.I.T.T. principles of flexibility and balance exercise training including progression. Also discusses the mind body connection.  Reviews various relaxation techniques to help reduce and manage stress (i.e. Deep breathing, progressive muscle relaxation, and visualization). Balance handout provided to take home. Written material given at graduation.   Activity Barriers & Risk Stratification:  Activity Barriers & Cardiac Risk Stratification - 05/25/20 0928       Activity Barriers & Cardiac Risk Stratification   Activity Barriers Joint Problems;Shortness of Breath;Muscular Weakness;Other (comment);Incisional Pain    Comments bilateral knee surgery (most recent meniscus tear in 9/20200, incisional soreness, weakness in chest from incision    Cardiac Risk Stratification Moderate             6 Minute Walk:  6 Minute Walk     Row Name 05/25/20 0926 09/21/20 0811       6 Minute Walk   Phase Initial Discharge    Distance 1515 feet 1950 feet    Distance % Change -- 28.7 %    Distance Feet Change -- 435 ft    Walk Time 6 minutes 6 minutes    # of Rest Breaks 0 0    MPH 2.87 3.69    METS 4.34 5.39    RPE 10 14    Perceived Dyspnea  1.5 --    VO2  Peak 15.19 18.85    Symptoms Yes (comment) Yes (comment)    Comments incisional soreness 2/10 --    Resting HR 93 bpm 66 bpm    Resting BP 122/64 126/78    Resting Oxygen Saturation  98 % 98 %    Exercise Oxygen Saturation  during 6 min walk 96 % 91 %    Max Ex. HR 108 bpm 115 bpm    Max Ex. BP 134/64 164/62    2 Minute Post BP 108/60 --             Oxygen Initial Assessment:   Oxygen Re-Evaluation:   Oxygen Discharge (Final Oxygen Re-Evaluation):   Initial Exercise Prescription:  Initial Exercise Prescription - 05/25/20 0900       Date of Initial Exercise RX and Referring Provider   Date 05/25/20    Referring Provider Thompson Grayer MD      Treadmill   MPH 2.8    Grade 2    Minutes 15    METs 3.92      Recumbant Elliptical   Level 2    RPM 50    Minutes 15    METs 3      Elliptical   Level 1    Speed 4.4    Minutes 15      Prescription Details   Frequency (times per week) 2    Duration Progress to 30 minutes of continuous aerobic without signs/symptoms of physical distress      Intensity   THRR 40-80% of Max Heartrate 122-151    Ratings of Perceived Exertion 11-13    Perceived Dyspnea 0-4      Progression   Progression Continue to progress workloads to maintain intensity without signs/symptoms of physical distress.      Resistance Training   Training Prescription Yes    Weight 6 lb    Reps 10-15             Perform Capillary Blood Glucose checks as needed.  Exercise Prescription Changes:   Exercise Prescription Changes     Row Name 05/25/20 0900 06/05/20 1700 06/21/20 0800 06/22/20 0700 07/04/20 1300     Response to Exercise   Blood Pressure (Admit) 122/64 132/82 126/78 -- 122/64  Blood Pressure (Exercise) 134/64 122/66 136/72 -- 138/74   Blood Pressure (Exit) 108/60 112/62 116/66 -- 122/62   Heart Rate (Admit) 93 bpm 94 bpm 91 bpm -- 62 bpm   Heart Rate (Exercise) 108 bpm 120 bpm 114 bpm -- 121 bpm   Heart Rate (Exit) 96 bpm  105 bpm 102 bpm -- 101 bpm   Oxygen Saturation (Admit) 97 % -- -- -- --   Oxygen Saturation (Exercise) 96 % -- -- -- --   Rating of Perceived Exertion (Exercise) 10 13 14  -- 15   Perceived Dyspnea (Exercise) 1.5 -- -- -- --   Symptoms incisional soreness 2/10 -- none -- none   Comments walk test results second day -- -- --   Duration -- -- Continue with 30 min of aerobic exercise without signs/symptoms of physical distress. -- Continue with 30 min of aerobic exercise without signs/symptoms of physical distress.   Intensity -- -- THRR unchanged -- THRR unchanged     Progression   Progression -- Continue to progress workloads to maintain intensity without signs/symptoms of physical distress. Continue to progress workloads to maintain intensity without signs/symptoms of physical distress. -- Continue to progress workloads to maintain intensity without signs/symptoms of physical distress.   Average METs -- 4 5.55 -- 5.3     Resistance Training   Training Prescription -- Yes Yes -- Yes   Weight -- 6 lb 10 lb -- 10 lb   Reps -- 10-15 10-15 -- 10-15     Interval Training   Interval Training -- -- Yes -- Yes   Equipment -- -- Treadmill -- Treadmill   Comments -- -- 3%-8% grade 1 min -- 3%-8% grade 1 min     Treadmill   MPH -- 3.5 3.5 -- 3.5   Grade -- 2 8 -- 9.5   Minutes -- 15 15 -- 15   METs -- 4.64 7.5 -- --     Recumbant Elliptical   Level -- -- 3 -- --   Minutes -- -- 15 -- --   METs -- -- 3.6 -- --     Elliptical   Level -- -- 2 -- 5   Speed -- -- 4.4 -- --   Minutes -- -- 15 -- 15     Home Exercise Plan   Plans to continue exercise at -- -- -- Home (comment)  walking, elliptical Home (comment)  walking, elliptical   Frequency -- -- -- Add 3 additional days to program exercise sessions. Add 3 additional days to program exercise sessions.   Initial Home Exercises Provided -- -- -- 06/22/20 06/22/20    Row Name 07/18/20 1400 08/02/20 1300 08/29/20 1300 09/12/20 1500 09/27/20  1500     Response to Exercise   Blood Pressure (Admit) 140/80 108/64 122/60 122/82 142/70   Blood Pressure (Exercise) 150/60 122/58 142/66 146/68 132/78   Blood Pressure (Exit) 106/68 104/60 114/64 132/70 120/72   Heart Rate (Admit) 64 bpm 87 bpm 82 bpm 82 bpm 70 bpm   Heart Rate (Exercise) 115 bpm 128 bpm 124 bpm 113 bpm 109 bpm   Heart Rate (Exit) 100 bpm 98 bpm 107 bpm 103 bpm 98 bpm   Rating of Perceived Exertion (Exercise) 14 14 16 14 14    Symptoms none none none none none   Duration Continue with 30 min of aerobic exercise without signs/symptoms of physical distress. Continue with 30 min of aerobic exercise without signs/symptoms of physical distress. Continue with 30 min of aerobic exercise  without signs/symptoms of physical distress. Continue with 30 min of aerobic exercise without signs/symptoms of physical distress. Continue with 30 min of aerobic exercise without signs/symptoms of physical distress.   Intensity THRR unchanged THRR unchanged THRR unchanged THRR unchanged THRR unchanged     Progression   Progression Continue to progress workloads to maintain intensity without signs/symptoms of physical distress. Continue to progress workloads to maintain intensity without signs/symptoms of physical distress. Continue to progress workloads to maintain intensity without signs/symptoms of physical distress. Continue to progress workloads to maintain intensity without signs/symptoms of physical distress. Continue to progress workloads to maintain intensity without signs/symptoms of physical distress.   Average METs 7.23 9.4 9.4 6.17 6     Resistance Training   Training Prescription Yes -- Yes Yes Yes   Weight 12 lb -- 12 lb 12 lb 12   Reps 10-15 -- 10-15 10-15 10-15     Interval Training   Interval Training Yes Yes Yes Yes --   Equipment Treadmill;Elliptical;REL-XR Treadmill;Elliptical;REL-XR Treadmill;Elliptical;REL-XR Treadmill;Elliptical;REL-XR --   Comments going up to 9% grade,  interval program on elliptical going up to 9% grade, interval program on elliptical going up to 9% grade, interval program on elliptical going up to 9.5% grade, interval program on elliptical --     Treadmill   MPH 4 4.5 -- 4 --   Grade 9 8 -- 9.5 --   Minutes 15 30 -- 15 --   METs 9.8 9.41 -- 10.1 --     Recumbant Elliptical   Level -- -- -- 8 --   Minutes -- -- -- 15 --   METs -- -- -- 4.3 --     Elliptical   Level 6 -- -- 9 --   Speed 4.4 -- -- 5.9 --   Minutes 15 -- -- 15 --   METs 4.5 -- -- 4.1 --     REL-XR   Level 8 -- -- -- --   Minutes 15 -- -- -- --   METs 7.4 -- -- -- --     Home Exercise Plan   Plans to continue exercise at Home (comment)  walking, elliptical -- -- Home (comment)  walking, elliptical --   Frequency Add 3 additional days to program exercise sessions. -- -- Add 3 additional days to program exercise sessions. --   Initial Home Exercises Provided 06/22/20 -- -- 06/22/20 --            Exercise Comments:   Exercise Comments     Row Name 05/30/20 0807           Exercise Comments First full day of exercise!  Patient was oriented to gym and equipment including functions, settings, policies, and procedures.  Patient's individual exercise prescription and treatment plan were reviewed.  All starting workloads were established based on the results of the 6 minute walk test done at initial orientation visit.  The plan for exercise progression was also introduced and progression will be customized based on patient's performance and goals.                Exercise Goals and Review:   Exercise Goals     Row Name 05/25/20 0930             Exercise Goals   Increase Physical Activity Yes       Intervention Provide advice, education, support and counseling about physical activity/exercise needs.;Develop an individualized exercise prescription for aerobic and resistive training based on initial evaluation findings, risk  stratification,  comorbidities and participant's personal goals.       Expected Outcomes Short Term: Attend rehab on a regular basis to increase amount of physical activity.;Long Term: Add in home exercise to make exercise part of routine and to increase amount of physical activity.;Long Term: Exercising regularly at least 3-5 days a week.       Increase Strength and Stamina Yes       Intervention Provide advice, education, support and counseling about physical activity/exercise needs.;Develop an individualized exercise prescription for aerobic and resistive training based on initial evaluation findings, risk stratification, comorbidities and participant's personal goals.       Expected Outcomes Short Term: Increase workloads from initial exercise prescription for resistance, speed, and METs.;Long Term: Improve cardiorespiratory fitness, muscular endurance and strength as measured by increased METs and functional capacity (6MWT);Short Term: Perform resistance training exercises routinely during rehab and add in resistance training at home       Able to understand and use rate of perceived exertion (RPE) scale Yes       Intervention Provide education and explanation on how to use RPE scale       Expected Outcomes Short Term: Able to use RPE daily in rehab to express subjective intensity level;Long Term:  Able to use RPE to guide intensity level when exercising independently       Able to understand and use Dyspnea scale Yes       Intervention Provide education and explanation on how to use Dyspnea scale       Expected Outcomes Short Term: Able to use Dyspnea scale daily in rehab to express subjective sense of shortness of breath during exertion;Long Term: Able to use Dyspnea scale to guide intensity level when exercising independently       Knowledge and understanding of Target Heart Rate Range (THRR) Yes       Intervention Provide education and explanation of THRR including how the numbers were predicted and where they  are located for reference       Expected Outcomes Short Term: Able to state/look up THRR;Short Term: Able to use daily as guideline for intensity in rehab;Long Term: Able to use THRR to govern intensity when exercising independently       Able to check pulse independently Yes       Intervention Provide education and demonstration on how to check pulse in carotid and radial arteries.;Review the importance of being able to check your own pulse for safety during independent exercise       Expected Outcomes Short Term: Able to explain why pulse checking is important during independent exercise;Long Term: Able to check pulse independently and accurately       Understanding of Exercise Prescription Yes       Intervention Provide education, explanation, and written materials on patient's individual exercise prescription       Expected Outcomes Short Term: Able to explain program exercise prescription;Long Term: Able to explain home exercise prescription to exercise independently                Exercise Goals Re-Evaluation :  Exercise Goals Re-Evaluation     Row Name 05/30/20 [phone] 06/05/20 1710 06/21/20 0841 06/22/20 0737 06/29/20 0722     Exercise Goal Re-Evaluation   Exercise Goals Review Understanding of Exercise Prescription;Knowledge and understanding of Target Heart Rate Range (THRR);Able to understand and use rate of perceived exertion (RPE) scale;Able to understand and use Dyspnea scale -- Increase Physical Activity;Increase Strength and Stamina;Understanding of Exercise  Prescription Increase Physical Activity;Increase Strength and Stamina;Understanding of Exercise Prescription;Able to understand and use rate of perceived exertion (RPE) scale;Able to understand and use Dyspnea scale;Knowledge and understanding of Target Heart Rate Range (THRR);Able to check pulse independently Increase Physical Activity;Increase Strength and Stamina;Understanding of Exercise Prescription   Comments Reviewed RPE  and dyspnea scales, THR and program prescription with pt today.  Pt voiced understanding and was given a copy of goals to take home. Dhilan has tolerated exercise well in his first 2 sessions.  We will monitor progress. Bravlio is doing well in rehab.  He is now doing intervals on the treadmill using the incline up to 8%.  We will continue to monitor his progress. Reviewed home exercise with pt today.  Pt plans to continue walking and using his elliptical at home for exercise.  Reviewed THR, pulse, RPE, sign and symptoms, pulse oximetery and when to call 911 or MD.  Also discussed weather considerations and indoor options.  Pt voiced understanding. Shannen is doing well in rehab.  He is exercising on his off days by walking and using his elliptical.  He was able to help someone move the other day.  He has plans to go play golf today.   Expected Outcomes Short: Use RPE daily to regulate intensity. Long: Follow program prescription in THR. Short: attend consistently Long: improve MET level Short: Continue to add in intervals and work up to them on elliptical  Long: Continue to improve stamina. Short: Continue to walk daily but focus on heart rate  Long: Continue to improve stamina. Short: Continue to get in exercise each day Long: Continue to improve stamina.    [place] Name 07/04/20 1309 07/18/20 1446 07/25/20 0733 08/02/20 1315 08/29/20 0726     Exercise Goal Re-Evaluation   Exercise Goals Review Increase Physical Activity;Increase Strength and Stamina Increase Physical Activity;Increase Strength and Stamina;Understanding of Exercise Prescription Increase Physical Activity;Increase Strength and Stamina;Understanding of Exercise Prescription Increase Physical Activity;Increase Strength and Stamina Increase Physical Activity;Increase Strength and Stamina   Comments Jeshua is progressing well.  he goes up to 9.5% incline on the TM and level 5 on elliptical.  We will continue to monitor progress. Mayes is about half way  through the program and doing well.  He continues to do his intervals each day.  He is now up to 12 lb weights.  We will continue to monitor his progress. Jeramy is doing well with exercising at home 3.25 miles per day (walking at a faster pace - 16 pace) - RPE 14. Plays basketball and golf. 200 push-ups, flutter kicks, curls, shoulder lifts, squats 4x/week - with 20lb weights. Bilily continues to progress well and does intervals on TM.  We will continue to monitor progress. Red is doing great. He continues to exercise 3 days per week of exercise on top of rehab days, concluding a total of 5 days . He is walking 2.5-3.5 miles for aerobic/, accumulating about 40 minutes; Continues to do  push ups, sit ups, pull ups, squats, most exercises that we do in class. No complaints and is tolerating everything well. He checks his HR and find that he is in his range most times.   Expected Outcomes Short: continue consistent exercise Long:  increase MET level Short: Continue with intervals Long: Continue to improve stamina. Short: Continue with intervals in gym, continue exercising at home Long: Continue to improve stamina. Short: maintain current exercise schedule Long:  build overall stamina Short: Continue checking HR during exercise Continue Long:  Continue appropriate exercise prescription at home    [place] Name 09/12/20 1552 09/26/20 0827           Exercise Goal Re-Evaluation   Exercise Goals Review Increase Physical Activity;Increase Strength and Stamina;Understanding of Exercise Prescription Increase Physical Activity;Increase Strength and Stamina;Understanding of Exercise Prescription      Comments Jedd has been doing well in rehab.  He is nearing graduation and will be needing his post 6MWT soon and we expect to see an improvement.  He continues to push himself and is now up to 9.5% grade!  We will continue to montior his progress. Derris is getting ready to graduate- he completed his 6MWT and improved by  almost 29%! He sees a big difference from the first time he started. He continues to walk at about 3 miles at a time, alternating days with his bike. Continues to do weights and bodyweight for resistance training. He checks his HR, though he says he struggles with reaching his THR sometimes. Encouraged to use RPE scale and modify intensity as long as he feels good and HR is appropriate. He will continue to exercise at home after he graduates.      Expected Outcomes Short: Improve post 6MWT Long: Continue to improve stamina Short: Graduate Long: Continue to exercise at home at appropriate exercise prescription               Discharge Exercise Prescription (Final Exercise Prescription Changes):  Exercise Prescription Changes - 09/27/20 1500       Response to Exercise   Blood Pressure (Admit) 142/70    Blood Pressure (Exercise) 132/78    Blood Pressure (Exit) 120/72    Heart Rate (Admit) 70 bpm    Heart Rate (Exercise) 109 bpm    Heart Rate (Exit) 98 bpm    Rating of Perceived Exertion (Exercise) 14    Symptoms none    Duration Continue with 30 min of aerobic exercise without signs/symptoms of physical distress.    Intensity THRR unchanged      Progression   Progression Continue to progress workloads to maintain intensity without signs/symptoms of physical distress.    Average METs 6      Resistance Training   Training Prescription Yes    Weight 12    Reps 10-15             Nutrition:  Target Goals: Understanding of nutrition guidelines, daily intake of sodium <1554m, cholesterol <2029m calories 30% from fat and 7% or less from saturated fats, daily to have 5 or more servings of fruits and vegetables.  Education: All About Nutrition: -Group instruction provided by verbal, written material, interactive activities, discussions, models, and posters to present general guidelines for heart healthy nutrition including fat, fiber, MyPlate, the role of sodium in heart healthy  nutrition, utilization of the nutrition label, and utilization of this knowledge for meal planning. Follow up email sent as well. Written material given at graduation. [facility]  Education need identified 05/24/20  Date 07/06/20  Educator MCGateInstruction Review Code 1- Verbalizes Understanding       Biometrics:  Pre Biometrics - 05/25/20 0931       Pre Biometrics   Height 5' 10.5" (1.791 m)    Weight 190 lb 3.2 oz (86.3 kg)    BMI (Calculated) 26.9    Single Leg Stand 28.8 seconds             Post  Biometrics - 09/21/20 2500        Post  Biometrics   Height 5' 10.5" (1.791 m)    Weight 194 lb 9.6 oz (88.3 kg)    BMI (Calculated) 27.52    Single Leg Stand 30 seconds             Nutrition Therapy Plan and Nutrition Goals:  Nutrition Therapy & Goals - 05/25/20 0925       Nutrition Therapy   Diet Heart healthy, low Na    Drug/Food Interactions Statins/Certain Fruits    Protein (specify units) 70g    Fiber 30 grams    Whole Grain Foods 3 servings    Saturated Fats 12 max. grams    Fruits and Vegetables 8 servings/day    Sodium 1.5 grams      Personal Nutrition Goals   Nutrition Goal ST: check almond milk for fortification, reduce sodium using some of the techniques discussed (citrus, vinegar, etc), increase variety, try "nice" cream LT: fiber 30g/day, 8 fruits and vegetables per day    Comments He reports limiting red meat, eating pork chops 1x/month, chicken and fish mostly. He reports eating whole grains and having a garden of his own vegetables he eats over the summer. he drinks almond milk or 2% milk. He likes oikos yogurt. For breakfast he will have oatmeal or grits with fruit and ground flax; on the weekned he will have bacon and eggs. L: sandwich ([place] or peanut butter and jelly) D: chicken, fish more during the summer. During the winter they eat more crockpot meals like chili. Vegetables:  leafy greens, string beans, butter beans, peas. Fruit: bananas, apples, clementines, fruit medley. S: ice cream or some fruit. Discussed heart healhty eating. Recommended trying "nice" cream (made with frozen bananas).      Intervention Plan   Intervention Prescribe, educate and counsel regarding individualized specific dietary modifications aiming towards targeted core components such as weight, hypertension, lipid management, diabetes, heart failure and other comorbidities.;Nutrition handout(s) given to patient.    Expected Outcomes Short Term Goal: Understand basic principles of dietary content, such as calories, fat, sodium, cholesterol and nutrients.;Short Term Goal: A plan has been developed with personal nutrition goals set during dietitian appointment.;Long Term Goal: Adherence to prescribed nutrition plan.             Nutrition Assessments:  MEDIFICTS Score Key: ?70 Need to make dietary changes  40-70 Heart Healthy Diet ? 40 Therapeutic Level Cholesterol Diet  Flowsheet Row Cardiac Rehab from 05/25/2020 in [facility] Cardiac and Pulmonary Rehab  Picture Your Plate Total Score on Admission 83      Picture Your Plate Scores: <37 Unhealthy dietary pattern with much room for improvement. 41-50 Dietary pattern unlikely to meet recommendations for good health and room for improvement. 51-60 More healthful dietary pattern, with some room for improvement.  >60 Healthy dietary pattern, although there may be some specific behaviors that could be improved.    Nutrition Goals Re-Evaluation:  Nutrition Goals Re-Evaluation     Row Name 06/29/20 0725 07/25/20 0738 08/29/20 0800 09/26/20 0856       Goals   Nutrition Goal ST: check almond milk for fortification, reduce sodium using some of the techniques discussed (citrus, vinegar, etc), increase variety, try "nice" cream LT: fiber 30g/day, 8 fruits and vegetables per day ST: Try carbohydrate snack before workout and carbohydrate/protein snack  post workout. LT: continue with heart healthy changes ST: Try carbohydrate snack before workout and carbohydrate/protein snack post workout. LT:  continue with heart healthy changes ST: Try carbohydrate snack before workout and carbohydrate/protein snack post workout. LT: continue with heart healthy changes    Comment Jamarrion is doing well with his diet.  He eats lots of fish and chicken.  He gets lots of vegetables.  He has not had a steak in over a month.  He is using almond milk some now with his cereal.  He is trying to watch his sodium more. He reports doing well with his nutrition. He likes chicken and fish, he likes pasta and potatoes. B: oatmeal with fruit and honey - sometimes he will have grits. He reports following MyPlate guidelines when eating and referring to his handouts given by RD. Continue to increase variety. Try carbohydrate snack before workout and carbohydrate/protein snack post workout. Chisom is doing well with his diet, he does not wish to start any new goals at this time. He does admit to drinking a little more than usual, which is increasing his weight and caloric intake. It tends to connect with social events such as golfing and hanging with friends. We talked about importance of staying hydrating and monitoring his weight. Goal is to be back down around 190 lb. Jidenna has been consistent with his weight. He reports that his goal is to stay under 190 lb, and right now he is about 5 lbs over his goals. He is trying to eliminate red meat from his diet and focus on fish and chicken for protein options. He is making sure he is eating enough to replenish his workouts. He is about to graduate and feels confident in his diet to continue what he is doing. Does not feel that he needs any new goals at this time.    Expected Outcome Short: Continue to watch sodium Long; Continue to eat healthier Short: Continue to watch sodium Long; Continue to eat healthier Short: Monitor weight Long: Continue heart  healthy diet Short: Continue to monitor weight and graduate Long: Maintain hearty healthy diet for long-term             Nutrition Goals Discharge (Final Nutrition Goals Re-Evaluation):  Nutrition Goals Re-Evaluation - 09/26/20 0856       Goals   Nutrition Goal ST: Try carbohydrate snack before workout and carbohydrate/protein snack post workout. LT: continue with heart healthy changes    Comment Ajahni has been consistent with his weight. He reports that his goal is to stay under 190 lb, and right now he is about 5 lbs over his goals. He is trying to eliminate red meat from his diet and focus on fish and chicken for protein options. He is making sure he is eating enough to replenish his workouts. He is about to graduate and feels confident in his diet to continue what he is doing. Does not feel that he needs any new goals at this time.    Expected Outcome Short: Continue to monitor weight and graduate Long: Maintain hearty healthy diet for long-term             Psychosocial: Target Goals: Acknowledge presence or absence of significant depression and/or stress, maximize coping skills, provide positive support system. Participant is able to verbalize types and ability to use techniques and skills needed for reducing stress and depression.   Education: Stress, Anxiety, and Depression - Group verbal and visual presentation to define topics covered.  Reviews how body is impacted by stress, anxiety, and depression.  Also discusses healthy ways to reduce stress and to treat/manage anxiety  and depression.  Written material given at graduation. Flowsheet Row Cardiac Rehab from [facility]21/2020 in [facility] Cardiac [facility] Pul[facility]  Education need identified 05/24/20  Date 08/03/20  Educator [facility]  Instruction Review Code 1- [employer] Understanding       Education: Sleep Hygiene -Provides group verbal and written instruction about how sleep can affect your health.  Define sleep hygiene, discuss  sleep cycles and impact of sleep habits. Review good sleep hygiene tips.    Initial Review & Psychosocial Screening:  Initial Psych Review & Screening - 05/18/20 1013       Initial Review   Current issues with None Identified      Family Dynamics   Good Support System? Yes    Comments He can look to his son and his wife for support. He has a good outlook on his health after his procedure and is ready to start exercising.      Barriers   Psychosocial barriers to participate in program There are no identifiable barriers or psychosocial needs.      Screening Interventions   Interventions To provide support and resources with identified psychosocial needs;Provide feedback about the scores to participant    Expected Outcomes Short Term goal: Utilizing psychosocial counselor, staff and physician to assist with identification of specific Stressors or current issues interfering with healing process. Setting desired goal for each stressor or current issue identified.;Long Term Goal: Stressors or current issues are controlled or eliminated.;Short Term goal: Identification and review with participant of any Quality of Life or Depression concerns found by scoring the questionnaire.;Long Term goal: The participant improves quality of Life and PHQ9 Scores as seen by post scores and/or verbalization of changes             Quality of Life Scores:   Quality of Life - 05/24/20 1313       Quality of Life   Select Quality of Life      Quality of Life Scores   Health/Function Pre 25.07 %    Socioeconomic Pre 26.25 %    Psych/Spiritual Pre 28.07 %    Family Pre 28.8 %    GLOBAL Pre 26.47 %            Scores of 19 and below usually indicate a poorer quality of life in these areas.  A difference of  2-3 points is a clinically meaningful difference.  A difference of 2-3 points in the total score of the Quality of Life Index has been associated with significant improvement in overall quality of  life, self-image, physical symptoms, and general health in studies assessing change in quality of life.  PHQ-9: Recent Review Flowsheet Data     Depression screen [facility] 2/9 07/25/2020 05/25/2020 04/06/2019 07/30/2018 06/29/2018   Decreased Interest 0 0 0 0 0   Down, Depressed, Hopeless 0 0 0 0 0   PHQ - 2 Score 0 0 0 0 0   Altered sleeping 0 1 0 - -   Tired, decreased energy 0 0 0 - -   Change in appetite 0 0 0 - -   Feeling bad or failure about yourself  0 0 0 - -   Trouble concentrating 0 0 0 - -   Moving slowly or fidgety/restless 0 0 0 - -   Suicidal thoughts 0 0 0 - -   PHQ-9 Score 0 1 0 - -   Difficult doing work/chores Not difficult at all Not difficult at all Not difficult at all - -  Interpretation of Total Score  Total Score Depression Severity:  1-4 = Minimal depression, 5-9 = Mild depression, 10-14 = Moderate depression, 15-19 = Moderately severe depression, 20-27 = Severe depression   Psychosocial Evaluation and Intervention:  Psychosocial Evaluation - 05/18/20 1014       Psychosocial Evaluation & Interventions   Interventions Encouraged to exercise with the program and follow exercise prescription;Relaxation education;Stress management education    Comments He can look to his son and his wife for support. He has a good outlook on his health after his procedure and is ready to start exercising.    Expected Outcomes Short: Exercise regularly to support mental health and notify staff of any changes. Long: maintain mental health and well being through teaching of rehab or prescribed medications independently.    Continue Psychosocial Services  Follow up required by staff             Psychosocial Re-Evaluation:  Psychosocial Re-Evaluation     [place] Name 06/29/20 [phone] 07/25/20 0736 08/29/20 0740 09/26/20 0739       Psychosocial Re-Evaluation   Current issues with None Identified -- None Identified None Identified    Comments Jahvier is doing well in rehab. He feels  good mentally and declines any major stressors.  He sleeps well most days.  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 Daksh is doing well in rehab. He feels good mentally and declines any major stressors. He sleeps well most days. He plays golf and basketball - he also likes reading. 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 Murrel is doing well mentally. Has good support from his family, especially including his wife. Sleep is good and has no complainnts. He is not on any medications for mental health and denies any anxiety or depression. He stays busy golfing with his friends and hanging out with them. Staying compliant with all his other medications. Ido is about to graduate. We gave him "back his structure that he needed" and feels confident exercising at home after graduation. Mavin continues to stay busy with social events. He goes to the beach a lot and is excited to continue that now that the weather is getting nicer. Denies any big complaints or stressors. Staying compliant with medications and looking forward to graduating.    Expected Outcomes Short: Continue to make time for exercise Long: Continue to stay positive. Short: Continue stress management Long: Continue to stay positive. Short: Continue attendance with rehab Long: Maintain positive attitude Short: Graduate Long: Continue positive attitude and use exercise to help with stress management    Interventions Encouraged to attend Cardiac Rehabilitation for the exercise Encouraged to attend Cardiac Rehabilitation for the exercise Encouraged to attend Cardiac Rehabilitation for the exercise Encouraged to attend Cardiac Rehabilitation for the exercise    Continue Psychosocial Services  Follow up required by staff Follow up required by staff Follow up required by staff Follow up required by staff              Psychosocial Discharge (Final Psychosocial Re-Evaluation):  Psychosocial Re-Evaluation - 09/26/20 0739       Psychosocial Re-Evaluation   Current issues with None Identified    Comments Montford is about to graduate. We gave him "back his structure that he needed" and feels confident exercising at home after graduation.  Nuh continues to stay busy with social events. He goes to the beach a lot and is excited to continue that now that the weather is getting nicer. Denies any big complaints or stressors. Staying compliant with medications and looking forward to graduating.    Expected Outcomes Short: Graduate Long: Continue positive attitude and use exercise to help with stress management    Interventions Encouraged to attend Cardiac Rehabilitation for the exercise    Continue Psychosocial Services  Follow up required by staff             Vocational Rehabilitation: Provide vocational rehab assistance to qualifying candidates.   Vocational Rehab Evaluation & Intervention:   Education: Education Goals: Education classes will be provided on a variety of topics geared toward better understanding of heart health and risk factor modification. Participant will state understanding/return demonstration of topics presented as noted by education test scores.  Learning Barriers/Preferences:  Learning Barriers/Preferences - 05/18/20 1009       Learning Barriers/Preferences   Learning Barriers None    Learning Preferences None             General Cardiac Education Topics:  AED/CPR: - Group verbal and written instruction with the use of models to demonstrate the basic use of the AED with the basic ABC's of resuscitation.   Anatomy and Cardiac Procedures: - Group verbal and visual presentation and models provide information about basic cardiac anatomy and function. Reviews the testing methods done to diagnose heart disease and the outcomes of the test results. Describes the  treatment choices: Medical Management, Angioplasty, or Coronary Bypass Surgery for treating various heart conditions including Myocardial Infarction, Angina, Valve Disease, and Cardiac Arrhythmias.  Written material given at graduation. Flowsheet Row Cardiac Rehab from 09/21/2020 in [facility] Cardiac and Pulmonary Rehab  Date 06/22/20  Educator SB  Instruction Review Code 1- Verbalizes Understanding       Medication Safety: - Group verbal and visual instruction to review commonly prescribed medications for heart and lung disease. Reviews the medication, class of the drug, and side effects. Includes the steps to properly store meds and maintain the prescription regimen.  Written material given at graduation. Flowsheet Row Cardiac Rehab from 09/21/2020 in [facility] Cardiac and Pulmonary Rehab  Date 07/13/20  Educator SB  Instruction Review Code 1- Verbalizes Understanding       Intimacy: - Group verbal instruction through game format to discuss how heart and lung disease can affect sexual intimacy. Written material given at graduation.. Flowsheet Row Cardiac Rehab from 09/21/2020 in [facility] Cardiac and Pulmonary Rehab  Date 06/15/20  Educator jh  Instruction Review Code 1- Verbalizes Understanding       Know Your Numbers and Heart Failure: - Group verbal and visual instruction to discuss disease risk factors for cardiac and pulmonary disease and treatment options.  Reviews associated critical values for Overweight/Obesity, Hypertension, Cholesterol, and Diabetes.  Discusses basics of heart failure: signs/symptoms and treatments.  Introduces Heart Failure Zone chart for action plan for heart failure.  Written material given at graduation. Flowsheet Row Cardiac Rehab from 09/21/2020 in [facility] Cardiac and Pulmonary Rehab  Date 09/21/20  Educator SB  Instruction Review Code 1- Verbalizes Understanding       Infection Prevention: - Provides verbal and written material to individual with discussion of  infection control including proper hand washing and proper equipment cleaning during exercise session. Flowsheet Row Cardiac Rehab from 09/21/2020 in [facility] Cardiac and Pulmonary Rehab  Date 05/18/20  Educator [facility]  Instruction Review  Code 1- Verbalizes Understanding       Falls Prevention: - Provides verbal and written material to individual with discussion of falls prevention and safety. Flowsheet Row Cardiac Rehab from 09/21/2020 in [facility] Cardiac and Pulmonary Rehab  Date 05/18/20  Educator [facility]  Instruction Review Code 1- Verbalizes Understanding       Other: -Provides group and verbal instruction on various topics (see comments)   Knowledge Questionnaire Score:  Knowledge Questionnaire Score - 05/24/20 1313       Knowledge Questionnaire Score   Pre Score 23/26 Education Focus: Depression, nutrition             Core Components/Risk Factors/Patient Goals at Admission:  Personal Goals and Risk Factors at Admission - 05/25/20 0932       Core Components/Risk Factors/Patient Goals on Admission    Weight Management Yes;Weight Loss    Intervention Weight Management: Develop a combined nutrition and exercise program designed to reach desired caloric intake, while maintaining appropriate intake of nutrient and fiber, sodium and fats, and appropriate energy expenditure required for the weight goal.;Weight Management: Provide education and appropriate resources to help participant work on and attain dietary goals.;Weight Management/Obesity: Establish reasonable short term and long term weight goals.    Admit Weight 190 lb 3.2 oz (86.3 kg)    Goal Weight: Short Term 185 lb (83.9 kg)    Goal Weight: Long Term 185 lb (83.9 kg)    Expected Outcomes Short Term: Continue to assess and modify interventions until short term weight is achieved;Long Term: Adherence to nutrition and physical activity/exercise program aimed toward attainment of established weight goal;Understanding recommendations  for meals to include 15-35% energy as protein, 25-35% energy from fat, 35-60% energy from carbohydrates, less than 213m of dietary cholesterol, 20-35 gm of total fiber daily;Understanding of distribution of calorie intake throughout the day with the consumption of 4-5 meals/snacks;Weight Loss: Understanding of general recommendations for a balanced deficit meal plan, which promotes 1-2 lb weight loss per week and includes a negative energy balance of [phone] kcal/d;Weight Maintenance: Understanding of the daily nutrition guidelines, which includes 25-35% calories from fat, 7% or less cal from saturated fats, less than 2060mcholesterol, less than 1.5gm of sodium, & 5 or more servings of fruits and vegetables daily    Hypertension Yes    Intervention Provide education on lifestyle modifcations including regular physical activity/exercise, weight management, moderate sodium restriction and increased consumption of fresh fruit, vegetables, and low fat dairy, alcohol moderation, and smoking cessation.;Monitor prescription use compliance.    Expected Outcomes Short Term: Continued assessment and intervention until BP is < 140/9032mG in hypertensive participants. < 130/38m73m in hypertensive participants with diabetes, heart failure or chronic kidney disease.;Long Term: Maintenance of blood pressure at goal levels.    Lipids Yes    Intervention Provide education and support for participant on nutrition & aerobic/resistive exercise along with prescribed medications to achieve LDL <70mg67mL >40mg.26mExpected Outcomes Short Term: Participant states understanding of desired cholesterol values and is compliant with medications prescribed. Participant is following exercise prescription and nutrition guidelines.;Long Term: Cholesterol controlled with medications as prescribed, with individualized exercise RX and with personalized nutrition plan. Value goals: LDL < 70mg, 53m> 40 mg.              Education:Diabetes - Individual verbal and written instruction to review signs/symptoms of diabetes, desired ranges of glucose level fasting, after meals and with exercise. Acknowledge that pre and post exercise glucose checks  will be done for 3 sessions at entry of program.   Core Components/Risk Factors/Patient Goals Review:   Goals and Risk Factor Review     Row Name 06/29/20 0726 07/25/20 0741 08/29/20 0733 09/26/20 0900       Core Components/Risk Factors/Patient Goals Review   Personal Goals Review Weight Management/Obesity;Hypertension Weight Management/Obesity;Hypertension;Improve shortness of breath with ADL's Weight Management/Obesity;Hypertension Weight Management/Obesity;Hypertension    Review Overton is doing well in rehab.  His weight was 188 lb today.  He tries to keep it around 185 lb at home.  Blood pressures have been good in class and he keeps an eye on them at home.  He is pleased with where things are currently. Ayodeji is doing well in rehab.  His weight was 189 lb today. He tries to keep it around 185 lb at home - he tries to saty between 185 and 190lbs. He checks his BP at home (<115/<75 in am,130/75-80 in pm)  this morning 108/64. He reports doctors took him off all BP medication December 2nd. He reports he would also like to manage his shortness of breath recovery. BP has been stable at rehab, at home it can range 037-048G systolic. He is currently not on any BP meds and his doctor is pleased with his numbers. Has cuff at home to take it and makes sure he checks it, especially if he ever feels off. Weight has been up, however, admits to intaking more excessive calories. He is continuing to watch  his weight and plans to move a little more to help lose those last couple of pounds. Garlin is continuing to do well. He is getting ready to graduate. BP has been stable so far, he continues to check it at home, systolic goes no higher than 130. His weight has been stagnant, he would  like to be under 190 lb, and right now he continues to stay around 192-195 lb. He pays attention to his diet, though sometimes had additional calories from drinking. He is ready to graduate and ready to continue what he is doing.    Expected Outcomes Short: Continue to maintain weight Long: Continue to monitor risk factors Short: Continue to maintain weight Long: Continue to monitor risk factors Short: Continue to monitor weight Long: Manage lifestyle risk factors Short: Graduate Long: Continue to manage life style risk factors at home             Core Components/Risk Factors/Patient Goals at Discharge (Final Review):   Goals and Risk Factor Review - 09/26/20 0900       Core Components/Risk Factors/Patient Goals Review   Personal Goals Review Weight Management/Obesity;Hypertension    Review Alonzo is continuing to do well. He is getting ready to graduate. BP has been stable so far, he continues to check it at home, systolic goes no higher than 130. His weight has been stagnant, he would like to be under 190 lb, and right now he continues to stay around 192-195 lb. He pays attention to his diet, though sometimes had additional calories from drinking. He is ready to graduate and ready to continue what he is doing.    Expected Outcomes Short: Graduate Long: Continue to manage life style risk factors at home             ITP Comments:  ITP Comments     Row Name 05/18/20 1009 05/25/20 0926 05/30/20 0807 06/21/20 0610 07/19/20 0713   ITP Comments Virtual Visit completed. Patient informed on EP and RD appointment  and 6 Minute walk test. Patient also informed of patient health questionnaires on My Chart. Patient Verbalizes understanding. Visit diagnosis can be found in [facility] 05/01/[facility]. Completed 6MWT and gym orientation. Initial ITP created and sent for review to Dr. Emily Filbert, Medical Director. First full day of exercise!  Patient was oriented to gym and equipment including functions, settings,  policies, and procedures.  Patient's individual exercise prescription and treatment plan were reviewed.  All starting workloads were established based on the results of the 6 minute walk test done at initial orientation visit.  The plan for exercise progression was also introduced and progression will be customized based on patient's performance and goals. 30 Day review completed. Medical Director ITP review done, changes made as directed, and signed approval by Medical Director. 30 Day review completed. Medical Director ITP review done, changes made as directed, and signed approval by Medical Director.    [facility] Name 08/16/20 0904 09/13/20 0615 10/03/20 0724 10/11/20 1142 10/17/20 1453   ITP Comments 30 Day review completed. Medical Director ITP review done, changes made as directed, and signed approval by Medical Director. 30 Day review completed. Medical Director ITP review done, changes made as directed, and signed approval by Medical Director. Favor graduated today from  rehab with 33 sessions completed.  Details of the patient's exercise prescription and what He needs to do in order to continue the prescription and progress were discussed with patient.  Patient was given a copy of prescription and goals.  Patient verbalized understanding.  Burrel plans to continue to exercise by walking at home and using his elliptical. Brennden graduated today from  rehab with 33 sessions completed.  Details of the patient's exercise prescription and what He needs to do in order to continue the prescription and progress were discussed with patient.  Patient was given a copy of prescription and goals.  Patient verbalized understanding.  Natalie plans to continue to exercise by walking at home and using his elliptical. Jarrel graduated on 6/7 and was supposed to bring back his post paperwork.  He has not brought it back and we will go ahead and close out his chart at this time.            Comments: Discharge ITP

## 2020-10-31 ENCOUNTER — Ambulatory Visit: Payer: Self-pay | Admitting: *Deleted

## 2020-10-31 NOTE — Telephone Encounter (Signed)
FYI. Appt was scheduled and he was advised to go to ER if symptoms worsen. Does patient need to be seen sooner? Please advise. Thanks!

## 2020-10-31 NOTE — Telephone Encounter (Signed)
Reason for Disposition  Abdominal pain is a chronic symptom (recurrent or ongoing AND present > 4 weeks)  Answer Assessment - Initial Assessment Questions 1. LOCATION: "Where does it hurt?"      Middle abdomen, under ribs and radiates to back  2. RADIATION: "Does the pain shoot anywhere else?" (e.g., chest, back)     Under ribs , to back  3. ONSET: "When did the pain begin?" (Minutes, hours or days ago)      6-8 weeks  4. SUDDEN: "Gradual or sudden onset?"     Gradual  5. PATTERN "Does the pain come and go, or is it constant?"    - If constant: "Is it getting better, staying the same, or worsening?"      (Note: Constant means the pain never goes away completely; most serious pain is constant and it progresses)     - If intermittent: "How long does it last?" "Do you have pain now?"     (Note: Intermittent means the pain goes away completely between bouts)     Comes and goes last episode Sunday 10/29/20 6. SEVERITY: "How bad is the pain?"  (e.g., Scale 1-10; mild, moderate, or severe)    - MILD (1-3): doesn't interfere with normal activities, abdomen soft and not tender to touch     - MODERATE (4-7): interferes with normal activities or awakens from sleep, abdomen tender to touch     - SEVERE (8-10): excruciating pain, doubled over, unable to do any normal activities       Moderate to severe at times  7. RECURRENT SYMPTOM: "Have you ever had this type of stomach pain before?" If Yes, ask: "When was the last time?" and "What happened that time?"      yes 8. CAUSE: "What do you think is causing the stomach pain?"     Not sure  9. RELIEVING/AGGRAVATING FACTORS: "What makes it better or worse?" (e.g., movement, antacids, bowel movement)     Nothing  10. OTHER SYMPTOMS: "Do you have any other symptoms?" (e.g., back pain, diarrhea, fever, urination pain, vomiting)       Fever, vomiting , back pain pressure in abdomen , diarrhea, nausea  Protocols used: Abdominal Pain - Male-A-AH

## 2020-10-31 NOTE — Telephone Encounter (Signed)
Patient called in to say that he have been having on and off issues with what he think may be his gall bladder for about six weeks but say that this weekend he had abdominal pain with nausea but did not go to the ER but need some directions. Please call  Ph# [phone]   Patient called back and reports symptoms . C/o pain and pressure in middle of abdomen, radiates under ribs, and back x 6-8 weeks . C/o episodes comes and goes and not symptoms at this time. Noted Sunday 10/29/20 fever 101.8 took tylenol and broke fever. C/o nausea, vomiting at times, tea colored urine . Recent heart surgery , Maze procedure. Positive flatulence. No pain at this time. Appt scheduled 11/21/20. Please notify patient is earlier appt available . Care advise given. Patient verbalized understanding of care advise and to call back or go to ED if symptoms worsen.

## 2020-11-02 NOTE — Progress Notes (Signed)
Carelink Summary Report / Loop Recorder 

## 2020-11-09 ENCOUNTER — Encounter: Payer: Self-pay | Admitting: *Deleted

## 2020-11-09 NOTE — Progress Notes (Signed)
Cardiac Individual Treatment Plan  Patient Details  Name: Jorge [name] MRN: [id number] Date of Birth: Jan 08, 1966 Referring Provider:   Flowsheet Row Cardiac Rehab from 05/25/2020 in [facility] Cardiac and Pulmonary Rehab  Referring Provider Thompson Grayer MD       Initial Encounter Date:  Flowsheet Row Cardiac Rehab from 05/25/2020 in [facility] Cardiac and Pulmonary Rehab  Date 05/25/20       Visit Diagnosis: No diagnosis found.  Patient's Home Medications on Admission:  Current Outpatient Medications:    aspirin 81 MG EC tablet, Take 81 mg by mouth daily. , Disp: , Rfl:    aspirin-acetaminophen-caffeine (EXCEDRIN MIGRAINE) 250-250-65 MG tablet, Take 2 tablets by mouth every 6 (six) hours as needed for headache. , Disp: , Rfl:    azelastine (ASTELIN) 0.1 % nasal spray, Place 1 spray into both nostrils 2 (two) times daily., Disp: 30 mL, Rfl: 12   ferrous [id number] C-folic acid (TRINSICON / FOLTRIN) capsule, Take 1 capsule by mouth 2 (two) times daily after a meal., Disp: 60 capsule, Rfl: 1   fluticasone (FLONASE) 50 MCG/ACT nasal spray, Place 2 sprays into both nostrils daily., Disp: 16 g, Rfl: 3   ibuprofen (ADVIL) 200 MG tablet, Take 800 mg by mouth every 6 (six) hours as needed for headache or moderate pain., Disp: , Rfl:    montelukast (SINGULAIR) 10 MG tablet, Take 1 tablet (10 mg total) by mouth at bedtime., Disp: 90 tablet, Rfl: 3   rivaroxaban (XARELTO) 20 MG TABS tablet, As needed if afib returns. Once daily with supper. Call and let us know if you need to use it., Disp: 30 tablet, Rfl: 0   rosuvastatin (CRESTOR) 40 MG tablet, Take 1 tablet (40 mg total) by mouth daily., Disp: 90 tablet, Rfl: 3   tamsulosin (FLOMAX) 0.4 MG CAPS capsule, Take 1 capsule (0.4 mg total) by mouth daily after supper., Disp: 30 capsule, Rfl: 3   traMADol (ULTRAM) 50 MG tablet, Take 1-2 tablets (50-100 mg total) by mouth every 4 (four) hours as needed for moderate pain., Disp: 28 tablet, Rfl:  0  Past Medical History: Past Medical History:  Diagnosis Date   Allergic rhinitis, seasonal    Anticoagulant long-term use managed by cardiology   asa and xarelto   Bilateral nephrolithiasis    urologist--- dr Junious Silk ([place] office)   Coronary artery disease cardiologist--- dr Angelena Form   nuclear stress test 10-18-2016 w/ normal perfusion;  cardiac cath 12-23-2017, PCI w/ DES to midLAD and mild RCA/ moderate CFx diease nonobstructive , normal LVF;  ETT 07-13-2019 negative for ischemia   Dysrhythmia    Afib   History of kidney stones    Hyperlipidemia    Hypertension    followed by pcp and cardiology   OA (osteoarthritis)    knees   OSA on CPAP    12-31-2019  per pt uses cpap every night   PAF (paroxysmal atrial fibrillation) ([place]) 2011   followed by dr Rayann Heman (ep)---  first dx 2011;  s/p DCCV's and ablation x2 in 2015 and last one 03/ 2018, and s/p loop recorder 05/ 2021;   failed multaq, flecainide, and amiodarone   Pneumonia    Right knee meniscal tear    S/P ablation of atrial fibrillation followed by dr Rayann Heman   x3  last one 2018   S/P drug eluting coronary stent placement 12/23/2017   x1  to midLAD   S/P minimally-invasive maze operation for atrial fibrillation 03/30/2020   Complete bilateral atrial lesion  set using cryothermy and bipolar radiofrequency ablation with clipping of LA appendage via right mini thoracotomy approach   SOB (shortness of breath)    12-31-2019  per pt only when he has atrial fib , otherwise no issues with sob with activities   Status post placement of implantable loop recorder 09/08/2019   followed by dr Rayann Heman    Tobacco Use: Social History   Tobacco Use  Smoking Status Never  Smokeless Tobacco Never    Labs: Recent Review Flowsheet Data     Labs for ITP Cardiac and Pulmonary Rehab Latest Ref Rng & Units 03/30/2020 03/30/2020 03/30/2020 03/30/2020 07/25/2020   Cholestrol 100 - 199 mg/dL - - - - 151   LDLCALC 0 - 99 mg/dL - - - - 79    HDL >39 mg/dL - - - - 59   Trlycerides 0 - 149 mg/dL - - - - 68   Hemoglobin A1c 4.8 - 5.6 % - - - - -   PHART 7.350 - 7.450 - 7.328(L) 7.267(L) 7.358 -   PCO2ART 32.0 - 48.0 mmHg - 45.9 51.5(H) 34.4 -   HCO3 20.0 - 28.0 mmol/L - 24.2 23.6 19.3(L) -   TCO2 22 - 32 mmol/L 23 26 25  20(L) -   ACIDBASEDEF 0.0 - 2.0 mmol/L - 2.0 4.0(H) 6.0(H) -   O2SAT % - 99.0 92.0 90.0 -        Exercise Target Goals: Exercise Program Goal: Individual exercise prescription set using results from initial 6 min walk test and THRR while considering  patient's activity barriers and safety.   Exercise Prescription Goal: Initial exercise prescription builds to 30-45 minutes a day of aerobic activity, 2-3 days per week.  Home exercise guidelines will be given to patient during program as part of exercise prescription that the participant will acknowledge.   Education: Aerobic Exercise: - Group verbal and visual presentation on the components of exercise prescription. Introduces F.I.T.T principle from ACSM for exercise prescriptions.  Reviews F.I.T.T. principles of aerobic exercise including progression. Written material given at graduation. Flowsheet Row Cardiac Rehab from 09/21/2020 in [facility] Cardiac and Pulmonary Rehab  Date 06/15/20  Educator jh  Instruction Review Code 1- Verbalizes Understanding       Education: Resistance Exercise: - Group verbal and visual presentation on the components of exercise prescription. Introduces F.I.T.T principle from ACSM for exercise prescriptions  Reviews F.I.T.T. principles of resistance exercise including progression. Written material given at graduation. Flowsheet Row Cardiac Rehab from 09/21/2020 in [facility] Cardiac and Pulmonary Rehab  Date 06/22/20  Educator [facility]  Instruction Review Code 1- Verbalizes Understanding        Education: Exercise & Equipment Safety: - Individual verbal instruction and demonstration of equipment use and safety with use of the  equipment. Flowsheet Row Cardiac Rehab from 09/21/2020 in [facility] Cardiac and Pulmonary Rehab  Date 05/18/20  Educator [facility]  Instruction Review Code 1- Verbalizes Understanding       Education: Exercise Physiology & General Exercise Guidelines: - Group verbal and written instruction with models to review the exercise physiology of the cardiovascular system and associated critical values. Provides general exercise guidelines with specific guidelines to those with heart or lung disease.    Education: Flexibility, Balance, Mind/Body Relaxation: - Group verbal and visual presentation with interactive activity on the components of exercise prescription. Introduces F.I.T.T principle from ACSM for exercise prescriptions. Reviews F.I.T.T. principles of flexibility and balance exercise training including progression. Also discusses the mind body connection.  Reviews various relaxation techniques  to help reduce and manage stress (i.e. Deep breathing, progressive muscle relaxation, and visualization). Balance handout provided to take home. Written material given at graduation.   Activity Barriers & Risk Stratification:   6 Minute Walk:  6 Minute Walk     Row Name 09/21/20 0811         6 Minute Walk   Phase Discharge     Distance 1950 feet     Distance % Change 28.7 %     Distance Feet Change 435 ft     Walk Time 6 minutes     # of Rest Breaks 0     MPH 3.69     METS 5.39     RPE 14     VO2 Peak 18.85     Symptoms Yes (comment)     Resting HR 66 bpm     Resting BP 126/78     Resting Oxygen Saturation  98 %     Exercise Oxygen Saturation  during 6 min walk 91 %     Max Ex. HR 115 bpm     Max Ex. BP 164/62              Oxygen Initial Assessment:   Oxygen Re-Evaluation:   Oxygen Discharge (Final Oxygen Re-Evaluation):   Initial Exercise Prescription:   Perform Capillary Blood Glucose checks as needed.  Exercise Prescription Changes:   Exercise Prescription Changes      Row Name 09/27/20 1500             Response to Exercise   Blood Pressure (Admit) 142/70       Blood Pressure (Exercise) 132/78       Blood Pressure (Exit) 120/72       Heart Rate (Admit) 70 bpm       Heart Rate (Exercise) 109 bpm       Heart Rate (Exit) 98 bpm       Rating of Perceived Exertion (Exercise) 14       Symptoms none       Duration Continue with 30 min of aerobic exercise without signs/symptoms of physical distress.       Intensity THRR unchanged               Progression     Progression Continue to progress workloads to maintain intensity without signs/symptoms of physical distress.       Average METs 6               Resistance Training     Training Prescription Yes       Weight 12       Reps 10-15               Exercise Comments:   Exercise Goals and Review:   Exercise Goals Re-Evaluation :  Exercise Goals Re-Evaluation     Row Name 09/26/20 0827             Exercise Goal Re-Evaluation   Exercise Goals Review Increase Physical Activity;Increase Strength and Stamina;Understanding of Exercise Prescription       Comments Jorge [name] is getting ready to graduate- he completed his 6MWT and improved by almost 29%! He sees a big difference from the first time he started. He continues to walk at about 3 miles at a time, alternating days with his bike. Continues to do weights and bodyweight for resistance training. He checks his HR, though he says he struggles with reaching his THR sometimes. Encouraged to use RPE  scale and modify intensity as long as he feels good and HR is appropriate. He will continue to exercise at home after he graduates.       Expected Outcomes Short: Graduate Long: Continue to exercise at home at appropriate exercise prescription                Discharge Exercise Prescription (Final Exercise Prescription Changes):  Exercise Prescription Changes - 09/27/20 1500       Response to Exercise   Blood Pressure (Admit) 142/70    Blood  Pressure (Exercise) 132/78    Blood Pressure (Exit) 120/72    Heart Rate (Admit) 70 bpm    Heart Rate (Exercise) 109 bpm    Heart Rate (Exit) 98 bpm    Rating of Perceived Exertion (Exercise) 14    Symptoms none    Duration Continue with 30 min of aerobic exercise without signs/symptoms of physical distress.    Intensity THRR unchanged      Progression   Progression Continue to progress workloads to maintain intensity without signs/symptoms of physical distress.    Average METs 6      Resistance Training   Training Prescription Yes    Weight 12    Reps 10-15             Nutrition:  Target Goals: Understanding of nutrition guidelines, daily intake of sodium 1500mg , cholesterol 200mg , calories 30% from fat and 7% or less from saturated fats, daily to have 5 or more servings of fruits and vegetables.  Education: All About Nutrition: -Group instruction provided by verbal, written material, interactive activities, discussions, models, and posters to present general guidelines for heart healthy nutrition including fat, fiber, MyPlate, the role of sodium in heart healthy nutrition, utilization of the nutrition label, and utilization of this knowledge for meal planning. Follow up email sent as well. Written material given at graduation. Flowsheet Row Cardiac Rehab from 09/21/2020 in [facility] Cardiac and Pulmonary Rehab  Education need identified 05/24/20  Date 07/06/20  Educator [place]  Instruction Review Code 1- Verbalizes Understanding       Biometrics:   Post Biometrics - 09/21/20 0811        Post  Biometrics   Height 5' 10.5" (1.791 m)    Weight 194 lb 9.6 oz (88.3 kg)    BMI (Calculated) 27.52    Single Leg Stand 30 seconds             Nutrition Therapy Plan and Nutrition Goals:   Nutrition Assessments:  MEDIFICTS Score Key: ?70 Need to make dietary changes  40-70 Heart Healthy Diet ? 40 Therapeutic Level Cholesterol Diet  Flowsheet Row Documentation from  11/09/2020 in [facility] Cardiac and Pulmonary Rehab  Picture Your Plate Total Score on Discharge 70      Picture Your Plate Scores: <57 Unhealthy dietary pattern with much room for improvement. 41-50 Dietary pattern unlikely to meet recommendations for good health and room for improvement. 51-60 More healthful dietary pattern, with some room for improvement.  >60 Healthy dietary pattern, although there may be some specific behaviors that could be improved.    Nutrition Goals Re-Evaluation:  Nutrition Goals Re-Evaluation     [place] Name 09/26/20 0856             Goals   Nutrition Goal ST: Try carbohydrate snack before workout and carbohydrate/protein snack post workout. LT: continue with heart healthy changes       Comment Jorge [name] has been consistent with his weight. He reports that  his goal is to stay under 190 lb, and right now he is about 5 lbs over his goals. He is trying to eliminate red meat from his diet and focus on fish and chicken for protein options. He is making sure he is eating enough to replenish his workouts. He is about to graduate and feels confident in his diet to continue what he is doing. Does not feel that he needs any new goals at this time.       Expected Outcome Short: Continue to monitor weight and graduate Long: Maintain hearty healthy diet for long-term                Nutrition Goals Discharge (Final Nutrition Goals Re-Evaluation):  Nutrition Goals Re-Evaluation - 09/26/20 0856       Goals   Nutrition Goal ST: Try carbohydrate snack before workout and carbohydrate/protein snack post workout. LT: continue with heart healthy changes    Comment Jorge [name] has been consistent with his weight. He reports that his goal is to stay under 190 lb, and right now he is about 5 lbs over his goals. He is trying to eliminate red meat from his diet and focus on fish and chicken for protein options. He is making sure he is eating enough to replenish his workouts. He is about to  graduate and feels confident in his diet to continue what he is doing. Does not feel that he needs any new goals at this time.    Expected Outcome Short: Continue to monitor weight and graduate Long: Maintain hearty healthy diet for long-term             Psychosocial: Target Goals: Acknowledge presence or absence of significant depression and/or stress, maximize coping skills, provide positive support system. Participant is able to verbalize types and ability to use techniques and skills needed for reducing stress and depression.   Education: Stress, Anxiety, and Depression - Group verbal and visual presentation to define topics covered.  Reviews how body is impacted by stress, anxiety, and depression.  Also discusses healthy ways to reduce stress and to treat/manage anxiety and depression.  Written material given at graduation. Flowsheet Row Cardiac Rehab from 09/21/2020 in [facility] Cardiac and Pulmonary Rehab  Education need identified 05/24/20  Date 08/03/20  Educator [facility]  Instruction Review Code 1- [employer] Understanding       Education: Sleep Hygiene -Provides group verbal and written instruction about how sleep can affect your health.  Define sleep hygiene, discuss sleep cycles and impact of sleep habits. Review good sleep hygiene tips.    Initial Review & Psychosocial Screening:   Quality of Life Scores:   Quality of Life - 11/09/20 1218       Quality of Life   Select Quality of Life      Quality of Life Scores   Health/Function Pre 25.07 %    Health/Function Post 28.15 %    Health/Function % Change 12.29 %    Socioeconomic Pre 26.25 %    Socioeconomic Post 26.29 %    Socioeconomic % Change  0.15 %    Psych/Spiritual Pre 28.07 %    Psych/Spiritual Post 27.43 %    Psych/Spiritual % Change -2.28 %    Family Pre 28.8 %    Family Post 23.7 %    Family % Change -17.71 %    GLOBAL Pre 26.47 %    GLOBAL Post 26.89 %    GLOBAL % Change 1.59 %  Scores of  19 and below usually indicate a poorer quality of life in these areas.  A difference of  2-3 points is a clinically meaningful difference.  A difference of 2-3 points in the total score of the Quality of Life Index has been associated with significant improvement in overall quality of life, self-image, physical symptoms, and general health in studies assessing change in quality of life.  PHQ-9: Recent Review Flowsheet Data     Depression screen [facility] 2/9 11/09/2020 07/25/2020 05/25/2020 04/06/2019 07/30/2018   Decreased Interest 0 0 0 0 0   Down, Depressed, Hopeless 0 0 0 0 0   PHQ - 2 Score 0 0 0 0 0   Altered sleeping 1 0 1 0 -   Tired, decreased energy 0 0 0 0 -   Change in appetite 0 0 0 0 -   Feeling bad or failure about yourself  0 0 0 0 -   Trouble concentrating 0 0 0 0 -   Moving slowly or fidgety/restless 0 0 0 0 -   Suicidal thoughts 0 0 0 0 -   PHQ-9 Score 1 0 1 0 -   Difficult doing work/chores Not difficult at all Not difficult at all Not difficult at all Not difficult at all -      Interpretation of Total Score  Total Score Depression Severity:  1-4 = Minimal depression, 5-9 = Mild depression, 10-14 = Moderate depression, 15-19 = Moderately severe depression, 20-27 = Severe depression   Psychosocial Evaluation and Intervention:   Psychosocial Re-Evaluation:  Psychosocial Re-Evaluation     Row Name 09/26/20 0739             Psychosocial Re-Evaluation   Current issues with None Identified       Comments Jorge [name] is about to graduate. We gave him "back his structure that he needed" and feels confident exercising at home after graduation. Jorge [name] continues to stay busy with social events. He goes to the beach a lot and is excited to continue that now that the weather is getting nicer. Denies any big complaints or stressors. Staying compliant with medications and looking forward to graduating.       Expected Outcomes Short: Graduate Long: Continue positive attitude and use  exercise to help with stress management       Interventions Encouraged to attend Cardiac Rehabilitation for the exercise       Continue Psychosocial Services  Follow up required by staff                Psychosocial Discharge (Final Psychosocial Re-Evaluation):  Psychosocial Re-Evaluation - 09/26/20 0739       Psychosocial Re-Evaluation   Current issues with None Identified    Comments Jorge [name] is about to graduate. We gave him "back his structure that he needed" and feels confident exercising at home after graduation. Jorge [name] continues to stay busy with social events. He goes to the beach a lot and is excited to continue that now that the weather is getting nicer. Denies any big complaints or stressors. Staying compliant with medications and looking forward to graduating.    Expected Outcomes Short: Graduate Long: Continue positive attitude and use exercise to help with stress management    Interventions Encouraged to attend Cardiac Rehabilitation for the exercise    Continue Psychosocial Services  Follow up required by staff             Vocational Rehabilitation: Provide vocational rehab assistance to qualifying candidates.  Vocational Rehab Evaluation & Intervention:   Education: Education Goals: Education classes will be provided on a variety of topics geared toward better understanding of heart health and risk factor modification. Participant will state understanding/return demonstration of topics presented as noted by education test scores.  Learning Barriers/Preferences:   General Cardiac Education Topics:  AED/CPR: - Group verbal and written instruction with the use of models to demonstrate the basic use of the AED with the basic ABC's of resuscitation.   Anatomy and Cardiac Procedures: - Group verbal and visual presentation and models provide information about basic cardiac anatomy and function. Reviews the testing methods done to diagnose heart disease and the  outcomes of the test results. Describes the treatment choices: Medical Management, Angioplasty, or Coronary Bypass Surgery for treating various heart conditions including Myocardial Infarction, Angina, Valve Disease, and Cardiac Arrhythmias.  Written material given at graduation. Flowsheet Row Cardiac Rehab from 09/21/2020 in [facility] Cardiac and Pulmonary Rehab  Date 06/22/20  Educator SB  Instruction Review Code 1- Verbalizes Understanding       Medication Safety: - Group verbal and visual instruction to review commonly prescribed medications for heart and lung disease. Reviews the medication, class of the drug, and side effects. Includes the steps to properly store meds and maintain the prescription regimen.  Written material given at graduation. Flowsheet Row Cardiac Rehab from 09/21/2020 in [facility] Cardiac and Pulmonary Rehab  Date 07/13/20  Educator SB  Instruction Review Code 1- Verbalizes Understanding       Intimacy: - Group verbal instruction through game format to discuss how heart and lung disease can affect sexual intimacy. Written material given at graduation.. Flowsheet Row Cardiac Rehab from 09/21/2020 in [facility] Cardiac and Pulmonary Rehab  Date 06/15/20  Educator jh  Instruction Review Code 1- Verbalizes Understanding       Know Your Numbers and Heart Failure: - Group verbal and visual instruction to discuss disease risk factors for cardiac and pulmonary disease and treatment options.  Reviews associated critical values for Overweight/Obesity, Hypertension, Cholesterol, and Diabetes.  Discusses basics of heart failure: signs/symptoms and treatments.  Introduces Heart Failure Zone chart for action plan for heart failure.  Written material given at graduation. Flowsheet Row Cardiac Rehab from 09/21/2020 in [facility] Cardiac and Pulmonary Rehab  Date 09/21/20  Educator SB  Instruction Review Code 1- Verbalizes Understanding       Infection Prevention: - Provides verbal and written  material to individual with discussion of infection control including proper hand washing and proper equipment cleaning during exercise session. Flowsheet Row Cardiac Rehab from 09/21/2020 in [facility] Cardiac and Pulmonary Rehab  Date 05/18/20  Educator [facility]  Instruction Review Code 1- Verbalizes Understanding       Falls Prevention: - Provides verbal and written material to individual with discussion of falls prevention and safety. Flowsheet Row Cardiac Rehab from 09/21/2020 in [facility] Cardiac and Pulmonary Rehab  Date 05/18/20  Educator [facility]  Instruction Review Code 1- Verbalizes Understanding       Other: -Provides group and verbal instruction on various topics (see comments)   Knowledge Questionnaire Score:  Knowledge Questionnaire Score - 11/09/20 1215       Knowledge Questionnaire Score   Post Score 26/26             Core Components/Risk Factors/Patient Goals at Admission:   Education:Diabetes - Individual verbal and written instruction to review signs/symptoms of diabetes, desired ranges of glucose level fasting, after meals and with exercise. Acknowledge that pre and post exercise  glucose checks will be done for 3 sessions at entry of program.   Core Components/Risk Factors/Patient Goals Review:   Goals and Risk Factor Review     Row Name 09/26/20 0900             Core Components/Risk Factors/Patient Goals Review   Personal Goals Review Weight Management/Obesity;Hypertension       Review Jorge [name] is continuing to do well. He is getting ready to graduate. BP has been stable so far, he continues to check it at home, systolic goes no higher than 130. His weight has been stagnant, he would like to be under 190 lb, and right now he continues to stay around 192-195 lb. He pays attention to his diet, though sometimes had additional calories from drinking. He is ready to graduate and ready to continue what he is doing.       Expected Outcomes Short: Graduate Long: Continue to  manage life style risk factors at home                Core Components/Risk Factors/Patient Goals at Discharge (Final Review):   Goals and Risk Factor Review - 09/26/20 0900       Core Components/Risk Factors/Patient Goals Review   Personal Goals Review Weight Management/Obesity;Hypertension    Review Jorge [name] is continuing to do well. He is getting ready to graduate. BP has been stable so far, he continues to check it at home, systolic goes no higher than 130. His weight has been stagnant, he would like to be under 190 lb, and right now he continues to stay around 192-195 lb. He pays attention to his diet, though sometimes had additional calories from drinking. He is ready to graduate and ready to continue what he is doing.    Expected Outcomes Short: Graduate Long: Continue to manage life style risk factors at home             ITP Comments:  ITP Comments     Row Name 09/13/20 0615 10/03/20 0724 10/11/20 1142 10/17/20 1453     ITP Comments 30 Day review completed. Medical Director ITP review done, changes made as directed, and signed approval by Medical Director. Lindsay graduated today from  rehab with 33 sessions completed.  Details of the patient's exercise prescription and what He needs to do in order to continue the prescription and progress were discussed with patient.  Patient was given a copy of prescription and goals.  Patient verbalized understanding.  Jorge [name] plans to continue to exercise by walking at home and using his elliptical. Jorge [name] graduated today from  rehab with 33 sessions completed.  Details of the patient's exercise prescription and what He needs to do in order to continue the prescription and progress were discussed with patient.  Patient was given a copy of prescription and goals.  Patient verbalized understanding.  Jorge [name] plans to continue to exercise by walking at home and using his elliptical. Jorge [name] graduated on 6/7 and was supposed to bring back his post paperwork.   He has not brought it back and we will go ahead and close out his chart at this time.             Comments: updated discharge ITP

## 2020-11-13 ENCOUNTER — Ambulatory Visit (INDEPENDENT_AMBULATORY_CARE_PROVIDER_SITE_OTHER): Payer: BC Managed Care – PPO

## 2020-11-13 DIAGNOSIS — I4819 Other persistent atrial fibrillation: Secondary | ICD-10-CM

## 2020-11-14 LAB — CUP PACEART REMOTE DEVICE CHECK
Date Time Interrogation Session: 20220719115447
Implantable Pulse Generator Implant Date: 20210512

## 2020-11-21 ENCOUNTER — Ambulatory Visit: Payer: BC Managed Care – PPO | Admitting: Family Medicine

## 2020-12-05 NOTE — Progress Notes (Signed)
Carelink Summary Report / Loop Recorder 

## 2020-12-11 ENCOUNTER — Ambulatory Visit: Payer: BC Managed Care – PPO | Admitting: Family Medicine

## 2020-12-11 ENCOUNTER — Other Ambulatory Visit: Payer: Self-pay

## 2020-12-11 VITALS — BP 123/77 | HR 92 | Resp 16 | Wt 197.0 lb

## 2020-12-11 DIAGNOSIS — R112 Nausea with vomiting, unspecified: Secondary | ICD-10-CM | POA: Diagnosis not present

## 2020-12-11 DIAGNOSIS — R140 Abdominal distension (gaseous): Secondary | ICD-10-CM | POA: Diagnosis not present

## 2020-12-11 DIAGNOSIS — R21 Rash and other nonspecific skin eruption: Secondary | ICD-10-CM

## 2020-12-11 DIAGNOSIS — I4891 Unspecified atrial fibrillation: Secondary | ICD-10-CM

## 2020-12-11 DIAGNOSIS — R319 Hematuria, unspecified: Secondary | ICD-10-CM | POA: Diagnosis not present

## 2020-12-11 LAB — POCT URINALYSIS DIPSTICK
Bilirubin, UA: NEGATIVE
Blood, UA: POSITIVE
Glucose, UA: NEGATIVE
Ketones, UA: NEGATIVE
Leukocytes, UA: NEGATIVE
Nitrite, UA: NEGATIVE
Protein, UA: NEGATIVE
Spec Grav, UA: 1.02 (ref 1.010–1.025)
pH, UA: 6 (ref 5.0–8.0)

## 2020-12-11 MED ORDER — MOMETASONE FUROATE 0.1 % EX CREA: 1.0000 "application " | TOPICAL_CREAM | Freq: Every day | CUTANEOUS | 0 refills | Status: DC | PRN

## 2020-12-11 NOTE — Progress Notes (Signed)
I,Pamela Maddy,acting as a scribe for Wilhemena Durie, MD.,have documented all relevant documentation on the behalf of Wilhemena Durie, MD,as directed by  Wilhemena Durie, MD while in the presence of Wilhemena Durie, MD.  Established patient visit   Patient: Jorge [name]   DOB: [birth year]   55 y.o. Male  MRN: [id number] Visit Date: 12/11/2020  Today's healthcare provider: Wilhemena Durie, MD   Chief Complaint  Patient presents with   Abdominal Pain   Subjective  -------------------------------------------------------------------------------------------------------------------- Abdominal Pain The problem has been resolved. The pain is located in the RUQ. The pain is at a severity of 7/10. The quality of the pain is cramping and sharp. The abdominal pain does not radiate. Associated symptoms include diarrhea, hematuria, myalgias, nausea and vomiting. Pertinent negatives include no anorexia, arthralgias, belching, constipation, dysuria, fever, flatus, frequency, headaches, hematochezia, melena or weight loss. Nothing aggravates the pain.     Patient states about month ago he had abdominal pain in RUQ. Patient states pain has subsided. Patient saw Urology who ruled out kidney school.  2 months ago he had GI symptoms with nausea vomiting diarrhea.  That is subsided but he does have some ongoing abdominal discomfort at times.  It is resolved now. He states his urine was darker that time it was tea colored and possibly red.      Medications: Outpatient Medications Prior to Visit  Medication Sig   aspirin 81 MG EC tablet Take 81 mg by mouth daily.    aspirin-acetaminophen-caffeine (EXCEDRIN MIGRAINE) 250-250-65 MG tablet Take 2 tablets by mouth every 6 (six) hours as needed for headache.    azelastine (ASTELIN) 0.1 % nasal spray Place 1 spray into both nostrils 2 (two) times daily.   ferrous [id number] C-folic acid (TRINSICON / FOLTRIN) capsule Take 1  capsule by mouth 2 (two) times daily after a meal.   fluticasone (FLONASE) 50 MCG/ACT nasal spray Place 2 sprays into both nostrils daily.   ibuprofen (ADVIL) 200 MG tablet Take 800 mg by mouth every 6 (six) hours as needed for headache or moderate pain.   montelukast (SINGULAIR) 10 MG tablet Take 1 tablet (10 mg total) by mouth at bedtime.   rivaroxaban (XARELTO) 20 MG TABS tablet As needed if afib returns. Once daily with supper. Call and let us know if you need to use it.   rosuvastatin (CRESTOR) 40 MG tablet Take 1 tablet (40 mg total) by mouth daily.   tamsulosin (FLOMAX) 0.4 MG CAPS capsule Take 1 capsule (0.4 mg total) by mouth daily after supper.   traMADol (ULTRAM) 50 MG tablet Take 1-2 tablets (50-100 mg total) by mouth every 4 (four) hours as needed for moderate pain.   No facility-administered medications prior to visit.    Review of Systems  Constitutional:  Negative for appetite change, chills, fever and weight loss.  Respiratory:  Negative for chest tightness, shortness of breath and wheezing.   Cardiovascular:  Negative for chest pain and palpitations.  Gastrointestinal:  Positive for abdominal pain, diarrhea, nausea and vomiting. Negative for anorexia, constipation, flatus, hematochezia and melena.  Genitourinary:  Positive for hematuria. Negative for dysuria and frequency.  Musculoskeletal:  Positive for myalgias. Negative for arthralgias.  Neurological:  Negative for headaches.       Objective  -------------------------------------------------------------------------------------------------------------------- BP 123/77 (BP Location: Right Arm, Patient Position: Sitting, Cuff Size: Large)   Pulse 92   Resp 16   Wt 197 lb (89.4 kg)   SpO2  96%   BMI 27.87 kg/m  BP Readings from Last 3 Encounters:  12/11/20 123/77  07/31/20 128/72  07/25/20 125/81   Wt Readings from Last 3 Encounters:  12/11/20 197 lb (89.4 kg)  09/21/20 194 lb 9.6 oz (88.3 kg)  07/31/20 190 lb  3.2 oz (86.3 kg)       Physical Exam Vitals reviewed.  Constitutional:      Appearance: He is well-developed.  HENT:     Head: Normocephalic and atraumatic.     Right Ear: External ear normal.     Left Ear: External ear normal.     Nose: Nose normal.  Eyes:     General: No scleral icterus.    Conjunctiva/sclera: Conjunctivae normal.  Cardiovascular:     Rate and Rhythm: Normal rate and regular rhythm.     Heart sounds: Normal heart sounds.  Pulmonary:     Effort: Pulmonary effort is normal.     Breath sounds: Normal breath sounds.  Abdominal:     Palpations: Abdomen is soft.  Genitourinary:    Penis: Normal.   Skin:    General: Skin is warm and dry.  Neurological:     General: No focal deficit present.     Mental Status: He is alert and oriented to person, place, and time.  Psychiatric:        Mood and Affect: Mood normal.        Behavior: Behavior normal.        Thought Content: Thought content normal.        Judgment: Judgment normal.      No results found for any visits on 12/11/20.  Assessment & Plan  ---------------------------------------------------------------------------------------------------------------------- 1. Hematuria, unspecified type Patient is now off of Xarelto. Refer back to urology - CBC w/Diff/Platelet - Comprehensive Metabolic Panel (CMET) - Lipase - POCT urinalysis dipstick - Ambulatory referral to Urology - US Abdomen Complete  2. Abdominal distension  - CBC w/Diff/Platelet - Comprehensive Metabolic Panel (CMET) - Lipase - POCT urinalysis dipstick - Ambulatory referral to Urology - US Abdomen Complete  3. Nausea and vomiting, intractability of vomiting not specified, unspecified vomiting type Symptoms are completely resolved but this could have been hepatic or gallbladder. This time to be complete due to his significance of symptoms will obtain abdominal ultrasound in addition to lab work. - CBC w/Diff/Platelet -  Comprehensive Metabolic Panel (CMET) - Lipase - POCT urinalysis dipstick - Ambulatory referral to Urology - US Abdomen Complete 4.  History of atrial fibrillation    No follow-ups on file.      I, Wilhemena Durie, MD, have reviewed all documentation for this visit. The documentation on 12/17/20 for the exam, diagnosis, procedures, and orders are all accurate and complete.    Richard Cranford Mon, MD  [facility] [phone] (phone) [phone] (fax)  [place]

## 2020-12-12 LAB — CBC WITH DIFFERENTIAL/PLATELET
Basophils Absolute: 0 10*3/uL (ref 0.0–0.2)
Basos: 0 %
EOS (ABSOLUTE): 0.2 10*3/uL (ref 0.0–0.4)
Eos: 2 %
Hematocrit: 37.3 % — ABNORMAL LOW (ref 37.5–51.0)
Hemoglobin: 12.8 g/dL — ABNORMAL LOW (ref 13.0–17.7)
Immature Grans (Abs): 0 10*3/uL (ref 0.0–0.1)
Immature Granulocytes: 0 %
Lymphocytes Absolute: 1.5 10*3/uL (ref 0.7–3.1)
Lymphs: 19 %
MCH: 30.8 pg (ref 26.6–33.0)
MCHC: 34.3 g/dL (ref 31.5–35.7)
MCV: 90 fL (ref 79–97)
Monocytes Absolute: 0.5 10*3/uL (ref 0.1–0.9)
Monocytes: 7 %
Neutrophils Absolute: 5.9 10*3/uL (ref 1.4–7.0)
Neutrophils: 72 %
Platelets: 232 10*3/uL (ref 150–450)
RBC: 4.16 x10E6/uL (ref 4.14–5.80)
RDW: 12.9 % (ref 11.6–15.4)
WBC: 8.2 10*3/uL (ref 3.4–10.8)

## 2020-12-12 LAB — COMPREHENSIVE METABOLIC PANEL
ALT: 28 IU/L (ref 0–44)
AST: 26 IU/L (ref 0–40)
Albumin/Globulin Ratio: 2.8 — ABNORMAL HIGH (ref 1.2–2.2)
Albumin: 5 g/dL — ABNORMAL HIGH (ref 3.8–4.9)
Alkaline Phosphatase: 63 IU/L (ref 44–121)
BUN/Creatinine Ratio: 27 — ABNORMAL HIGH (ref 9–20)
BUN: 19 mg/dL (ref 6–24)
Bilirubin Total: 0.7 mg/dL (ref 0.0–1.2)
CO2: 21 mmol/L (ref 20–29)
Calcium: 9.4 mg/dL (ref 8.7–10.2)
Chloride: 106 mmol/L (ref 96–106)
Creatinine, Ser: 0.7 mg/dL — ABNORMAL LOW (ref 0.76–1.27)
Globulin, Total: 1.8 g/dL (ref 1.5–4.5)
Glucose: 91 mg/dL (ref 65–99)
Potassium: 3.9 mmol/L (ref 3.5–5.2)
Sodium: 143 mmol/L (ref 134–144)
Total Protein: 6.8 g/dL (ref 6.0–8.5)
eGFR: 109 mL/min/{1.73_m2} (ref >59)

## 2020-12-12 LAB — LIPASE: Lipase: 30 U/L (ref 13–78)

## 2020-12-15 ENCOUNTER — Other Ambulatory Visit: Payer: Self-pay

## 2020-12-15 ENCOUNTER — Ambulatory Visit (HOSPITAL_BASED_OUTPATIENT_CLINIC_OR_DEPARTMENT_OTHER)
Admission: RE | Admit: 2020-12-15 | Discharge: 2020-12-15 | Disposition: A | Discharge status: Home / Self Care | Payer: BC Managed Care – PPO | Source: Ambulatory Visit | Attending: Family Medicine | Admitting: Family Medicine

## 2020-12-15 DIAGNOSIS — R112 Nausea with vomiting, unspecified: Secondary | ICD-10-CM | POA: Diagnosis present

## 2020-12-15 DIAGNOSIS — R140 Abdominal distension (gaseous): Secondary | ICD-10-CM | POA: Insufficient documentation

## 2020-12-15 DIAGNOSIS — R319 Hematuria, unspecified: Secondary | ICD-10-CM | POA: Diagnosis present

## 2020-12-18 ENCOUNTER — Other Ambulatory Visit: Payer: Self-pay | Admitting: Family Medicine

## 2020-12-18 ENCOUNTER — Ambulatory Visit (INDEPENDENT_AMBULATORY_CARE_PROVIDER_SITE_OTHER): Payer: BC Managed Care – PPO

## 2020-12-18 DIAGNOSIS — J301 Allergic rhinitis due to pollen: Secondary | ICD-10-CM

## 2020-12-18 DIAGNOSIS — I480 Paroxysmal atrial fibrillation: Secondary | ICD-10-CM | POA: Diagnosis not present

## 2020-12-18 NOTE — Telephone Encounter (Signed)
Requested medication (s) are due for refill today - yes-expired Rx  Requested medication (s) are on the active medication list -yes  Future visit scheduled -yes  Last refill: 12/16/19 16g 3RF  Notes to clinic: Rx request passes protocol for RF- Rx expired-sent for PCP review   Requested Prescriptions  Pending Prescriptions Disp Refills   fluticasone (FLONASE) 50 MCG/ACT nasal spray [Pharmacy Med Name: FLUTICASONE PROPIONATE NS] 16 g 0    Sig: Place 2 sprays into both nostrils daily.     Ear, Nose, and Throat: Nasal Preparations - Corticosteroids Passed - 12/18/2020  1:37 PM      Passed - Valid encounter within last 12 months    Recent Outpatient Visits           1 week ago Hematuria, unspecified type   [facility] Jerrol Banana., MD   4 months ago Annual physical exam   [facility] Jerrol Banana., MD   9 months ago Sinusitis, unspecified chronicity, unspecified location   [place], Vickki Muff, PA-C   1 year ago Essential hypertension   [facility] Jerrol Banana., MD   1 year ago Annual physical exam   [facility] Jerrol Banana., MD       Future Appointments             In 1 month Thompson Grayer, MD [facility], [facility]   In 7 months Jerrol Banana., MD [facility], [facility]               Requested Prescriptions  Pending Prescriptions Disp Refills   fluticasone (FLONASE) 50 MCG/ACT nasal spray [Pharmacy Med Name: FLUTICASONE PROPIONATE NS] 16 g 0    Sig: Place 2 sprays into both nostrils daily.     Ear, Nose, and Throat: Nasal Preparations - Corticosteroids Passed - 12/18/2020  1:37 PM      Passed - Valid encounter within last 12 months    Recent Outpatient Visits           1 week ago Hematuria, unspecified type   [facility] Jerrol Banana., MD   4 months ago Annual physical exam    [facility] Jerrol Banana., MD   9 months ago Sinusitis, unspecified chronicity, unspecified location   [place], Vickki Muff, [place]   1 year ago Essential hypertension   [facility] Jerrol Banana., MD   1 year ago Annual physical exam   [facility] Jerrol Banana., MD       Future Appointments             In 1 month Thompson Grayer, MD [facility], [facility]   In 7 months Jerrol Banana., MD [facility], [place]

## 2020-12-19 LAB — CUP PACEART REMOTE DEVICE CHECK
Date Time Interrogation Session: 20220823080027
Implantable Pulse Generator Implant Date: 20210512

## 2021-01-03 NOTE — Progress Notes (Signed)
Carelink Summary Report / Loop Recorder 

## 2021-01-15 ENCOUNTER — Other Ambulatory Visit: Payer: Self-pay | Admitting: Family Medicine

## 2021-01-15 DIAGNOSIS — J301 Allergic rhinitis due to pollen: Secondary | ICD-10-CM

## 2021-01-22 ENCOUNTER — Ambulatory Visit (INDEPENDENT_AMBULATORY_CARE_PROVIDER_SITE_OTHER): Payer: BC Managed Care – PPO

## 2021-01-22 DIAGNOSIS — I480 Paroxysmal atrial fibrillation: Secondary | ICD-10-CM

## 2021-01-23 LAB — CUP PACEART REMOTE DEVICE CHECK
Date Time Interrogation Session: 20220926162430
Implantable Pulse Generator Implant Date: 20210512

## 2021-01-26 NOTE — Progress Notes (Signed)
Carelink Summary Report / Loop Recorder 

## 2021-02-05 ENCOUNTER — Ambulatory Visit: Payer: BC Managed Care – PPO | Admitting: Internal Medicine

## 2021-02-05 DIAGNOSIS — I480 Paroxysmal atrial fibrillation: Secondary | ICD-10-CM

## 2021-02-05 DIAGNOSIS — I2510 Atherosclerotic heart disease of native coronary artery without angina pectoris: Secondary | ICD-10-CM

## 2021-02-05 DIAGNOSIS — I10 Essential (primary) hypertension: Secondary | ICD-10-CM

## 2021-02-19 ENCOUNTER — Ambulatory Visit: Payer: BC Managed Care – PPO | Admitting: Internal Medicine

## 2021-02-21 LAB — CUP PACEART REMOTE DEVICE CHECK
Date Time Interrogation Session: 20221026105042
Implantable Pulse Generator Implant Date: 20210512

## 2021-02-26 ENCOUNTER — Ambulatory Visit (INDEPENDENT_AMBULATORY_CARE_PROVIDER_SITE_OTHER): Payer: BC Managed Care – PPO

## 2021-02-26 DIAGNOSIS — I480 Paroxysmal atrial fibrillation: Secondary | ICD-10-CM

## 2021-03-05 NOTE — Progress Notes (Signed)
Carelink Summary Report / Loop Recorder 

## 2021-03-06 ENCOUNTER — Other Ambulatory Visit: Payer: Self-pay | Admitting: Family Medicine

## 2021-03-06 DIAGNOSIS — J301 Allergic rhinitis due to pollen: Secondary | ICD-10-CM

## 2021-03-09 ENCOUNTER — Ambulatory Visit: Payer: BC Managed Care – PPO | Admitting: Internal Medicine

## 2021-03-09 ENCOUNTER — Other Ambulatory Visit: Payer: Self-pay

## 2021-03-09 VITALS — BP 122/72 | HR 72 | Ht 70.0 in | Wt 193.0 lb

## 2021-03-09 DIAGNOSIS — I480 Paroxysmal atrial fibrillation: Secondary | ICD-10-CM | POA: Diagnosis not present

## 2021-03-09 DIAGNOSIS — I2510 Atherosclerotic heart disease of native coronary artery without angina pectoris: Secondary | ICD-10-CM | POA: Diagnosis not present

## 2021-03-09 DIAGNOSIS — I10 Essential (primary) hypertension: Secondary | ICD-10-CM

## 2021-03-09 NOTE — Progress Notes (Signed)
PCP: Jerrol Banana., MD Primary Cardiologist: Dr Angelena Form Primary EP: Dr Rayann Heman  Jorge [name] is a 55 y.o. male who presents today for routine electrophysiology followup.  Since last being seen in our clinic, the patient reports doing very well.  Today, he denies symptoms of palpitations, chest pain, shortness of breath,  lower extremity edema, dizziness, presyncope, or syncope.  The patient is otherwise without complaint today.   Past Medical History:  Diagnosis Date   Allergic rhinitis, seasonal    Anticoagulant long-term use managed by cardiology   asa and xarelto   Bilateral nephrolithiasis    urologist--- dr Junious Silk ([place] office)   Coronary artery disease cardiologist--- dr Angelena Form   nuclear stress test 10-18-2016 w/ normal perfusion;  cardiac cath 12-23-2017, PCI w/ DES to midLAD and mild RCA/ moderate CFx diease nonobstructive , normal LVF;  ETT 07-13-2019 negative for ischemia   Dysrhythmia    Afib   History of kidney stones    Hyperlipidemia    Hypertension    followed by pcp and cardiology   OA (osteoarthritis)    knees   OSA on CPAP    12-31-2019  per pt uses cpap every night   PAF (paroxysmal atrial fibrillation) ([place]) 2011   followed by dr Rayann Heman (ep)---  first dx 2011;  s/p DCCV's and ablation x2 in 2015 and last one 03/ 2018, and s/p loop recorder 05/ 2021;   failed multaq, flecainide, and amiodarone   Pneumonia    Right knee meniscal tear    S/P ablation of atrial fibrillation followed by dr Rayann Heman   x3  last one 2018   S/P drug eluting coronary stent placement 12/23/2017   x1  to midLAD   S/P minimally-invasive maze operation for atrial fibrillation 03/30/2020   Complete bilateral atrial lesion set using cryothermy and bipolar radiofrequency ablation with clipping of LA appendage via right mini thoracotomy approach   SOB (shortness of breath)    12-31-2019  per pt only when he has atrial fib , otherwise no issues with sob with activities    Status post placement of implantable loop recorder 09/08/2019   followed by dr Rayann Heman   Past Surgical History:  Procedure Laterality Date   [place] N/A 11/02/2013   PVI by Dr Rayann Heman   ATRIAL FIBRILLATION ABLATION N/A 03/29/2014   PVI and CTI by Dr Rayann Heman   ATRIAL FIBRILLATION ABLATION N/A 07/04/2016   Procedure: Atrial Fibrillation Ablation;  Surgeon: Thompson Grayer, MD;  Location: [place] CV LAB;  Service: Cardiovascular;  Laterality: N/A;   CARDIOVERSION N/A 10/15/2013   Procedure: CARDIOVERSION;  Surgeon: Fay Records, MD;  Location: [facility] ENDOSCOPY;  Service: Cardiovascular;  Laterality: N/A;   CLIPPING OF ATRIAL APPENDAGE  03/30/2020   Procedure: CLIPPING OF ATRIAL APPENDAGE USING ATRICURE ATRICLIP [id number];  Surgeon: Rexene Alberts, MD;  Location: [place];  Service: Open Heart Surgery;;   COLONOSCOPY  2016   CORONARY STENT INTERVENTION N/A 12/23/2017   Procedure: CORONARY STENT INTERVENTION;  Surgeon: Burnell Blanks, MD;  Location: [place] CV LAB;  Service: Cardiovascular;  Laterality: N/A;   EXTRACORPOREAL SHOCK WAVE LITHOTRIPSY  2005;  2013   implantable loop recorder placement  09/08/2019   (done in office)   Medtronic Reveal Linq model LNQ 22 (SN [id number]) implantable loop recorder implanted in office by Dr Rayann Heman for afib management   INGUINAL HERNIA REPAIR Left 05/31/2015   Procedure: HERNIA REPAIR INGUINAL ADULT;  Surgeon: Robert Bellow, MD;  Location: [facility] ORS;  Service: General;  Laterality: Left;   KNEE ARTHROSCOPY Left 2009   @[facility]    KNEE ARTHROSCOPY WITH MEDIAL MENISECTOMY Right 01/04/2020   Procedure: KNEE ARTHROSCOPY WITH PARTIAL MEDIAL MENISECTOMY;  Surgeon: Nicholes Stairs, MD;  Location: [facility];  Service: Orthopedics;  Laterality: Right;   MINIMALLY INVASIVE MAZE PROCEDURE N/A 03/30/2020   Procedure: MINIMALLY INVASIVE MAZE PROCEDURE;  Surgeon: Rexene Alberts, MD;  Location: [place];  Service:  Open Heart Surgery;  Laterality: N/A;   RHINOPLASTY  2006    @[facility]    RIGHT/LEFT HEART CATH AND CORONARY ANGIOGRAPHY N/A 12/23/2017   Procedure: RIGHT/LEFT HEART CATH AND CORONARY ANGIOGRAPHY;  Surgeon: Burnell Blanks, MD;  Location: [place] CV LAB;  Service: Cardiovascular;  Laterality: N/A;   RIGHT/LEFT HEART CATH AND CORONARY ANGIOGRAPHY N/A 02/02/2020   Procedure: RIGHT/LEFT HEART CATH AND CORONARY ANGIOGRAPHY;  Surgeon: Burnell Blanks, MD;  Location: [place] CV [facility];  Service: Cardiovascular;  Laterality: N/A;   ROBOT ASSISTED INGUINAL HERNIA REPAIR Left 05-01-2013   @[facility]    TEE WITHOUT CARDIOVERSION N/A 11/01/2013   Procedure: TRANSESOPHAGEAL ECHOCARDIOGRAM (TEE);  Surgeon: Candee Furbish, MD;  Location: [facility] ENDOSCOPY;  Service: Cardiovascular;  Laterality: N/A;   TEE WITHOUT CARDIOVERSION N/A 03/28/2014   Procedure: TRANSESOPHAGEAL ECHOCARDIOGRAM (TEE);  Surgeon: Larey Dresser, MD;  Location: [place];  Service: Cardiovascular;  Laterality: N/A;   TEE WITHOUT CARDIOVERSION N/A 02/02/2020   Procedure: TRANSESOPHAGEAL ECHOCARDIOGRAM (TEE);  Surgeon: Werner Lean, MD;  Location: [facility] ENDOSCOPY;  Service: Cardiovascular;  Laterality: N/A;   TEE WITHOUT CARDIOVERSION N/A 03/30/2020   Procedure: TRANSESOPHAGEAL ECHOCARDIOGRAM (TEE);  Surgeon: Rexene Alberts, MD;  Location: [place];  Service: Open Heart Surgery;  Laterality: N/A;    ROS- all systems are reviewed and negatives except as per HPI above  Current Outpatient Medications  Medication Sig Dispense Refill   aspirin 81 MG EC tablet Take 81 mg by mouth daily.      aspirin-acetaminophen-caffeine (EXCEDRIN MIGRAINE) 250-250-65 MG tablet Take 2 tablets by mouth every 6 (six) hours as needed for headache.      azelastine (ASTELIN) 0.1 % nasal spray Place 1 spray into both nostrils 2 (two) times daily. 30 mL 12   fluticasone (FLONASE) 50 MCG/ACT nasal spray Place 2 sprays into both nostrils daily. 16 g 5    ibuprofen (ADVIL) 200 MG tablet Take 800 mg by mouth every 6 (six) hours as needed for headache or moderate pain.     mometasone (ELOCON) 0.1 % cream Apply 1 application topically daily as needed. 45 g 0   montelukast (SINGULAIR) 10 MG tablet Take 1 tablet (10 mg total) by mouth at bedtime. 90 tablet 3   rosuvastatin (CRESTOR) 40 MG tablet Take 1 tablet (40 mg total) by mouth daily. 90 tablet 3   tamsulosin (FLOMAX) 0.4 MG CAPS capsule Take 1 capsule (0.4 mg total) by mouth daily after supper. 30 capsule 3   traMADol (ULTRAM) 50 MG tablet Take 1-2 tablets (50-100 mg total) by mouth every 4 (four) hours as needed for moderate pain. 28 tablet 0   No current facility-administered medications for this visit.    Physical Exam: Vitals:   03/09/21 1516  BP: 122/72  Pulse: 72  SpO2: 96%  Weight: 193 lb (87.5 kg)  Height: 5\' 10"  (1.778 m)    GEN- The patient is well appearing, alert and oriented x 3 today.   Head- normocephalic, atraumatic  Eyes-  Sclera clear, conjunctiva pink Ears- hearing intact Oropharynx- clear Lungs- Clear to ausculation bilaterally, normal work of breathing Heart- Regular rate and rhythm, no murmurs, rubs or gallops, PMI not laterally displaced GI- soft, NT, ND, + BS Extremities- no clubbing, cyanosis, or edema  Wt Readings from Last 3 Encounters:  03/09/21 193 lb (87.5 kg)  12/11/20 197 lb (89.4 kg)  09/21/20 194 lb 9.6 oz (88.3 kg)    EKG tracing ordered today is personally reviewed and shows sinus  Assessment and Plan:  Paroxysmal atrial fibrillation Well controlled post MAZE Burden by ILR is <1% S/p LAA clip He is therefore not taking Sonterra  2. HTN Stable No change required today  3. CAD No ischemic symptoms  Return to AF clinic in 6 months Follow-up with Dr Angelena Form as scheduled  Thompson Grayer MD, [facility] 03/09/2021 3:44 PM

## 2021-03-09 NOTE — Patient Instructions (Addendum)
Medication Instructions:  Your physician recommends that you continue on your current medications as directed. Please refer to the Current Medication list given to you today. *If you need a refill on your cardiac medications before your next appointment, please call your pharmacy*  Lab Work: None. If you have labs (blood work) drawn today and your tests are completely normal, you will receive your results only by: [place] (if you have MyChart) OR A paper copy in the mail If you have any lab test that is abnormal or we need to change your treatment, we will call you to review the results.  Testing/Procedures: None.  Follow-Up: At [facility], you and your health needs are our priority.  As part of our continuing mission to provide you with exceptional heart care, we have created designated Provider Care Teams.  These Care Teams include your primary Cardiologist (physician) and Advanced Practice Providers (APPs -  Physician Assistants and Nurse Practitioners) who all work together to provide you with the care you need, when you need it.  Your physician wants you to follow-up in: 6 month follow up in the [facility]. They will contact you to schedule.   We recommend signing up for the patient portal called "MyChart".  Sign up information is provided on this After Visit Summary.  MyChart is used to connect with patients for Virtual Visits (Telemedicine).  Patients are able to view lab/test results, encounter notes, upcoming appointments, etc.  Non-urgent messages can be sent to your provider as well.   To learn more about what you can do with MyChart, go to [email or web address].    Any Other Special Instructions Will Be Listed Below (If Applicable).

## 2021-03-26 ENCOUNTER — Ambulatory Visit (INDEPENDENT_AMBULATORY_CARE_PROVIDER_SITE_OTHER): Payer: BC Managed Care – PPO

## 2021-03-26 DIAGNOSIS — I480 Paroxysmal atrial fibrillation: Secondary | ICD-10-CM

## 2021-03-27 LAB — CUP PACEART REMOTE DEVICE CHECK
Date Time Interrogation Session: 20221129150751
Implantable Pulse Generator Implant Date: 20210512

## 2021-03-28 ENCOUNTER — Ambulatory Visit: Payer: BC Managed Care – PPO

## 2021-03-28 ENCOUNTER — Ambulatory Visit: Payer: BC Managed Care – PPO | Admitting: Surgery

## 2021-04-03 NOTE — Progress Notes (Signed)
Carelink Summary Report / Loop Recorder 

## 2021-04-04 NOTE — Addendum Note (Signed)
Addended by: Douglass Rivers D on: 04/04/2021 11:48 AM   Modules accepted: Level of Service

## 2021-04-27 ENCOUNTER — Ambulatory Visit (INDEPENDENT_AMBULATORY_CARE_PROVIDER_SITE_OTHER): Payer: BC Managed Care – PPO

## 2021-04-27 DIAGNOSIS — I480 Paroxysmal atrial fibrillation: Secondary | ICD-10-CM | POA: Diagnosis not present

## 2021-04-27 LAB — CUP PACEART REMOTE DEVICE CHECK
Date Time Interrogation Session: 20221230065438
Implantable Pulse Generator Implant Date: 20210512

## 2021-05-09 NOTE — Progress Notes (Signed)
Carelink Summary Report / Loop Recorder 

## 2021-05-30 ENCOUNTER — Ambulatory Visit (INDEPENDENT_AMBULATORY_CARE_PROVIDER_SITE_OTHER): Payer: BC Managed Care – PPO

## 2021-05-30 DIAGNOSIS — I480 Paroxysmal atrial fibrillation: Secondary | ICD-10-CM

## 2021-05-30 LAB — CUP PACEART REMOTE DEVICE CHECK
Date Time Interrogation Session: 20230201103339
Implantable Pulse Generator Implant Date: 20210512

## 2021-06-06 NOTE — Progress Notes (Signed)
Carelink Summary Report / Loop Recorder 

## 2021-06-28 ENCOUNTER — Other Ambulatory Visit: Payer: Self-pay

## 2021-06-28 ENCOUNTER — Encounter: Payer: Self-pay | Admitting: Physician Assistant

## 2021-06-28 ENCOUNTER — Ambulatory Visit: Payer: BC Managed Care – PPO | Admitting: Physician Assistant

## 2021-06-28 VITALS — BP 133/90 | HR 82 | Temp 98.8°F | Resp 16

## 2021-06-28 DIAGNOSIS — J019 Acute sinusitis, unspecified: Secondary | ICD-10-CM | POA: Diagnosis not present

## 2021-06-28 MED ORDER — AMOXICILLIN-POT CLAVULANATE 875-125 MG PO TABS: 1.0000 | ORAL_TABLET | Freq: Two times a day (BID) | ORAL | 0 refills | Status: DC

## 2021-06-28 NOTE — Progress Notes (Signed)
?  ? ? ?I,Roshena L Chambers,acting as a scribe for [employer], PA-C.,have documented all relevant documentation on the behalf of Mardene Speak, PA-C,as directed by  [employer], PA-C while in the presence of [employer], PA-C.  ? ?Established patient visit ? ? ?Patient: Jorge [name]   DOB: [birth year]   56 y.o. Male  MRN: [id number] ?Visit Date: 06/28/2021 ? ?Today's healthcare provider: Mardene Speak, PA-C  ? ?Chief Complaint  ?Patient presents with  ? Sinus Problem  ? ?Subjective  ?  ?Sinus Problem ?This is a new problem. Episode onset: 8 days ago. The problem has been gradually worsening since onset. Maximum temperature: low grade up to 100.0. Associated symptoms include chills, congestion, coughing (productive with thick green sputum), diaphoresis, ear pain, headaches, a hoarse voice, sinus pressure, sneezing and a sore throat. Pertinent negatives include no shortness of breath. Past treatments include acetaminophen (also Ibuprofen, Guifenisen, NyQuil and sinus rinse). The treatment provided mild relief.   ?Patient states he was seen by his eye doctor 6 days ago and was prescribed eye drops for an eye infection.  ? ?Medications: ?Outpatient Medications Prior to Visit  ?Medication Sig  ? aspirin 81 MG EC tablet Take 81 mg by mouth daily.   ? aspirin-acetaminophen-caffeine (EXCEDRIN MIGRAINE) 250-250-65 MG tablet Take 2 tablets by mouth every 6 (six) hours as needed for headache.   ? azelastine (ASTELIN) 0.1 % nasal spray Place 1 spray into both nostrils 2 (two) times daily.  ? fluticasone (FLONASE) 50 MCG/ACT nasal spray Place 2 sprays into both nostrils daily.  ? ibuprofen (ADVIL) 200 MG tablet Take 800 mg by mouth every 6 (six) hours as needed for headache or moderate pain.  ? Loteprednol-Tobramycin (ZYLET) 0.5-0.3 % SUSP   ? mometasone (ELOCON) 0.1 % cream Apply 1 application topically daily as needed.  ? montelukast (SINGULAIR) 10 MG tablet Take 1 tablet (10 mg total) by mouth at bedtime.  ?  rosuvastatin (CRESTOR) 40 MG tablet Take 1 tablet (40 mg total) by mouth daily.  ? tamsulosin (FLOMAX) 0.4 MG CAPS capsule Take 1 capsule (0.4 mg total) by mouth daily after supper.  ? traMADol (ULTRAM) 50 MG tablet Take 1-2 tablets (50-100 mg total) by mouth every 4 (four) hours as needed for moderate pain.  ? ?No facility-administered medications prior to visit.  ? ? ?Review of Systems  ?Constitutional:  Positive for chills and diaphoresis. Negative for appetite change and fever.  ?HENT:  Positive for congestion, ear pain, hoarse voice, sinus pressure, sneezing and sore throat.   ?Respiratory:  Positive for cough (productive with thick green sputum). Negative for chest tightness, shortness of breath and wheezing.   ?Cardiovascular:  Negative for chest pain and palpitations.  ?Gastrointestinal:  Negative for abdominal pain, nausea and vomiting.  ?Neurological:  Positive for headaches.  ? ?  Objective  ?  ?BP 133/90 (BP Location: Left Arm, Patient Position: Sitting, Cuff Size: Large)   Pulse 82   Temp 98.8 ?F (37.1 ?C) (Oral)   Resp 16   SpO2 98% Comment: room air ? ?Physical Exam ?Vitals and nursing note reviewed.  ?Constitutional:   ?   Appearance: Normal appearance.  ?HENT:  ?   Head: Normocephalic and atraumatic.  ?   Right Ear: Ear canal and external ear normal.  ?   Left Ear: Ear canal and external ear normal.  ?   Ears:  ?   Comments: Fluid behind TMs ?   Nose: Congestion and rhinorrhea present.  ?  Comments: Tenderness over maxillary sinuses noted ?   Mouth/Throat:  ?   Mouth: Mucous membranes are moist.  ?   Pharynx: No oropharyngeal exudate or posterior oropharyngeal erythema.  ?Neck:  ?   Vascular: No carotid bruit.  ?Cardiovascular:  ?   Rate and Rhythm: Normal rate and regular rhythm.  ?   Pulses: Normal pulses.  ?   Heart sounds: Normal heart sounds.  ?Pulmonary:  ?   Effort: Pulmonary effort is normal.  ?   Breath sounds: Normal breath sounds.  ?Musculoskeletal:  ?   Cervical back: Normal range  of motion and neck supple. No tenderness.  ?Lymphadenopathy:  ?   Cervical: No cervical adenopathy.  ?Neurological:  ?   Mental Status: He is alert.  ?Psychiatric:     ?   Behavior: Behavior normal.     ?   Thought Content: Thought content normal.     ?   Judgment: Judgment normal.  ?  ? ? ? Assessment & Plan  ?  ? ?Acute rhinosinusitis x 8 days ?- symptoms and exam c/w viral URI - no evidence of strep pharyngitis, CAP, AOM, bacterial sinusitis, or other bacterial infection ?- discussed symptomatic management, natural course, and return precautions ?- if symptoms stay the same or worsen, start abx. Prescription was sent to pt's pharmacy.  ? ?I discussed the assessment and treatment plan with the patient  The patient was provided an opportunity to ask questions and all were answered. The patient agreed with the plan and demonstrated an understanding of the instructions. ?  ?The patient was advised to call back or seek an in-person evaluation if the symptoms worsen or if the condition fails to improve as anticipated. ? ? ?Mardene Speak, PA-C  ?[place] ?[phone] (phone) ?[phone] (fax) ? ?[facility]  ?

## 2021-07-02 ENCOUNTER — Ambulatory Visit (INDEPENDENT_AMBULATORY_CARE_PROVIDER_SITE_OTHER): Payer: BC Managed Care – PPO

## 2021-07-02 DIAGNOSIS — I480 Paroxysmal atrial fibrillation: Secondary | ICD-10-CM | POA: Diagnosis not present

## 2021-07-03 LAB — CUP PACEART REMOTE DEVICE CHECK
Date Time Interrogation Session: 20230307094947
Implantable Pulse Generator Implant Date: 20210512

## 2021-07-10 NOTE — Progress Notes (Signed)
Carelink Summary Report / Loop Recorder 

## 2021-07-25 NOTE — Progress Notes (Deleted)
Complete physical exam   Patient: Jorge [name]   DOB: 29-Oct-1965   56 y.o. Male  MRN: [id number] Visit Date: 07/26/2021  Today's healthcare provider: Megan Mans, MD   No chief complaint on file.  Subjective    Jorge [name] is a 56 y.o. male who presents today for a complete physical exam.  He reports consuming a {diet types:17450} diet. {Exercise:19826} He generally feels {well/fairly well/poorly:18703}. He reports sleeping {well/fairly well/poorly:18703}. He {does/does not:200015} have additional problems to discuss today.  HPI  ***  Past Medical History:  Diagnosis Date   Allergic rhinitis, seasonal    Anticoagulant long-term use managed by cardiology   asa and xarelto   Bilateral nephrolithiasis    urologist--- dr Mena Goes ([place] office)   Coronary artery disease cardiologist--- dr Clifton James   nuclear stress test 10-18-2016 w/ normal perfusion;  cardiac cath 12-23-2017, PCI w/ DES to midLAD and mild RCA/ moderate CFx diease nonobstructive , normal LVF;  ETT 07-13-2019 negative for ischemia   Dysrhythmia    Afib   History of kidney stones    Hyperlipidemia    Hypertension    followed by pcp and cardiology   OA (osteoarthritis)    knees   OSA on CPAP    12-31-2019  per pt uses cpap every night   PAF (paroxysmal atrial fibrillation) (HCC) 2011   followed by dr Johney Frame (ep)---  first dx 2011;  s/p DCCV's and ablation x2 in 2015 and last one 03/ 2018, and s/p loop recorder 05/ 2021;   failed multaq, flecainide, and amiodarone   Pneumonia    Right knee meniscal tear    S/P ablation of atrial fibrillation followed by dr Johney Frame   x3  last one 2018   S/P drug eluting coronary stent placement 12/23/2017   x1  to midLAD   S/P minimally-invasive maze operation for atrial fibrillation 03/30/2020   Complete bilateral atrial lesion set using cryothermy and bipolar radiofrequency ablation with clipping of LA appendage via right mini thoracotomy approach    SOB (shortness of breath)    12-31-2019  per pt only when he has atrial fib , otherwise no issues with sob with activities   Status post placement of implantable loop recorder 09/08/2019   followed by dr Johney Frame   Past Surgical History:  Procedure Laterality Date   APPENDECTOMY  1975   ATRIAL FIBRILLATION ABLATION N/A 11/02/2013   PVI by Dr Johney Frame   ATRIAL FIBRILLATION ABLATION N/A 03/29/2014   PVI and CTI by Dr Johney Frame   ATRIAL FIBRILLATION ABLATION N/A 07/04/2016   Procedure: Atrial Fibrillation Ablation;  Surgeon: Hillis Range, MD;  Location: [facility] INVASIVE CV LAB;  Service: Cardiovascular;  Laterality: N/A;   CARDIOVERSION N/A 10/15/2013   Procedure: CARDIOVERSION;  Surgeon: Pricilla Riffle, MD;  Location: [facility] ENDOSCOPY;  Service: Cardiovascular;  Laterality: N/A;   CLIPPING OF ATRIAL APPENDAGE  03/30/2020   Procedure: CLIPPING OF ATRIAL APPENDAGE USING ATRICURE ATRICLIP [id number];  Surgeon: Purcell Nails, MD;  Location: [facility] OR;  Service: Open Heart Surgery;;   COLONOSCOPY  2016   CORONARY STENT INTERVENTION N/A 12/23/2017   Procedure: CORONARY STENT INTERVENTION;  Surgeon: Kathleene Hazel, MD;  Location: [facility] INVASIVE CV LAB;  Service: Cardiovascular;  Laterality: N/A;   EXTRACORPOREAL SHOCK WAVE LITHOTRIPSY  2005;  2013   implantable loop recorder placement  09/08/2019   (done in office)   Medtronic Reveal Linq model LNQ 22 (SN [id number]) implantable loop recorder implanted  in office by Dr Johney Frame for afib management   INGUINAL HERNIA REPAIR Left 05/31/2015   Procedure: HERNIA REPAIR INGUINAL ADULT;  Surgeon: Earline Mayotte, MD;  Location: [facility] ORS;  Service: General;  Laterality: Left;   KNEE ARTHROSCOPY Left 2009   @[facility]    KNEE ARTHROSCOPY WITH MEDIAL MENISECTOMY Right 01/04/2020   Procedure: KNEE ARTHROSCOPY WITH PARTIAL MEDIAL MENISECTOMY;  Surgeon: Yolonda Kida, MD;  Location: [facility];  Service: Orthopedics;  Laterality: Right;   MINIMALLY INVASIVE MAZE  PROCEDURE N/A 03/30/2020   Procedure: MINIMALLY INVASIVE MAZE PROCEDURE;  Surgeon: Purcell Nails, MD;  Location[facility] OR;  Service: Open Heart Surgery;  Laterality: N/A;   RHINOPLASTY  2006    @[facility]    RIGHT/LEFT HEART CATH AND CORONARY ANGIOGRAPHY N/A 12/23/2017   Procedure: RIGHT/LEFT HEART CATH AND CORONARY ANGIOGRAPHY;  Surgeon: Kathleene Hazel, MD;  Location: [facility] INVASIVE CV LAB;  Service: Cardiovascular;  Laterality: N/A;   RIGHT/LEFT HEART CATH AND CORONARY ANGIOGRAPHY N/A 02/02/2020   Procedure: RIGHT/LEFT HEART CATH AND CORONARY ANGIOGRAPHY;  Surgeon: Kathleene Hazel, MD;  Location: [facility] INVASIVE CV LAB;  Service: Cardiovascular;  Laterality: N/A;   ROBOT ASSISTED INGUINAL HERNIA REPAIR Left 05-01-2013   @[facility]    TEE WITHOUT CARDIOVERSION N/A 11/01/2013   Procedure: TRANSESOPHAGEAL ECHOCARDIOGRAM (TEE);  Surgeon: Donato Schultz, MD;  Location: [facility] ENDOSCOPY;  Service: Cardiovascular;  Laterality: N/A;   TEE WITHOUT CARDIOVERSION N/A 03/28/2014   Procedure: TRANSESOPHAGEAL ECHOCARDIOGRAM (TEE);  Surgeon: Laurey Morale, MD;  Location: [facility] ENDOSCOPY;  Service: Cardiovascular;  Laterality: N/A;   TEE WITHOUT CARDIOVERSION N/A 02/02/2020   Procedure: TRANSESOPHAGEAL ECHOCARDIOGRAM (TEE);  Surgeon: Christell Constant, MD;  Location: [facility] ENDOSCOPY;  Service: Cardiovascular;  Laterality: N/A;   TEE WITHOUT CARDIOVERSION N/A 03/30/2020   Procedure: TRANSESOPHAGEAL ECHOCARDIOGRAM (TEE);  Surgeon: Purcell Nails, MD;  Location: [facility] OR;  Service: Open Heart Surgery;  Laterality: N/A;   Social History   Socioeconomic History   Marital status: Married    Spouse name: Not on file   Number of children: Not on file   Years of education: Not on file   Highest education level: Not on file  Occupational History   Occupation: International aid/development worker w/ [employer]  Tobacco Use   Smoking status: Never   Smokeless tobacco: Never  Vaping Use   Vaping Use: Never used  Substance and Sexual Activity    Alcohol use: Yes    Alcohol/week: 2.0 - 3.0 standard drinks    Types: 2 - 3 Cans of beer per week    Comment: occasionally   Drug use: Never   Sexual activity: Yes  Other Topics Concern   Not on file  Social History Narrative   Lives in [place] with his spouse.  Works as Engineer, technical sales of the [employer]   Social Determinants of Corporate investment banker Strain: Not on [employer] Insecurity: Not on file  Transportation Needs: Not on file  Physical Activity: Not on file  Stress: Not on file  Social Connections: Not on file  Intimate Partner Violence: Not on file   Family Status  Relation Name Status   Mother  Deceased at age 65   MGF  Deceased at age 48       from MI   Brother  Alive       stable health, mental health issues   PGM  Deceased       From CVA   Father  Deceased at age 85       suicide   MGM  Deceased   Other  (Not Specified)   PGF  Deceased   Neg Hx  (Not Specified)   Family History  Problem Relation Age of Onset   Cancer Mother        breast, cause of death   Heart attack Maternal Grandfather 49       Cause of death   Hypertension Maternal Grandfather    Heart failure Brother    CVA Paternal Grandmother        cause of death   Atrial fibrillation Maternal Grandmother    Colon polyps Other        Fam Hx   Kidney cancer Neg Hx    Bladder Cancer Neg Hx    Prostate cancer Neg Hx    Allergies  Allergen Reactions   Hydrocodone Nausea And Vomiting   Oxycodone Nausea And Vomiting    Patient Care Team: Maple Hudson., MD as PCP - General (Family Medicine) Hillis Range, MD as PCP - Electrophysiology (Cardiology) Kathleene Hazel, MD as PCP - Cardiology (Cardiology) Maple Hudson., MD (Family Medicine) Lemar Livings Merrily Pew, MD (General Surgery)   Medications: Outpatient Medications Prior to Visit  Medication Sig   amoxicillin-clavulanate (AUGMENTIN) 875-125 MG tablet Take 1 tablet by mouth 2 (two) times daily.    aspirin 81 MG EC tablet Take 81 mg by mouth daily.    aspirin-acetaminophen-caffeine (EXCEDRIN MIGRAINE) 250-250-65 MG tablet Take 2 tablets by mouth every 6 (six) hours as needed for headache.    azelastine (ASTELIN) 0.1 % nasal spray Place 1 spray into both nostrils 2 (two) times daily.   fluticasone (FLONASE) 50 MCG/ACT nasal spray Place 2 sprays into both nostrils daily.   ibuprofen (ADVIL) 200 MG tablet Take 800 mg by mouth every 6 (six) hours as needed for headache or moderate pain.   Loteprednol-Tobramycin (ZYLET) 0.5-0.3 % SUSP    mometasone (ELOCON) 0.1 % cream Apply 1 application topically daily as needed.   montelukast (SINGULAIR) 10 MG tablet Take 1 tablet (10 mg total) by mouth at bedtime.   rosuvastatin (CRESTOR) 40 MG tablet Take 1 tablet (40 mg total) by mouth daily.   tamsulosin (FLOMAX) 0.4 MG CAPS capsule Take 1 capsule (0.4 mg total) by mouth daily after supper.   traMADol (ULTRAM) 50 MG tablet Take 1-2 tablets (50-100 mg total) by mouth every 4 (four) hours as needed for moderate pain.   No facility-administered medications prior to visit.    Review of Systems  All other systems reviewed and are negative.  {Labs  Heme  Chem  Endocrine  Serology  Results Review (optional):23779}  Objective    There were no vitals taken for this visit. {Show previous vital signs (optional):23777}   Physical Exam  ***  Last depression screening scores    11/09/2020   12:18 PM 07/25/2020    9:12 AM 05/25/2020    9:33 AM  PHQ 2/9 Scores  PHQ - 2 Score 0 0 0  PHQ- 9 Score 1 0 1   Last fall risk screening    05/18/2020   10:04 AM  Fall Risk   Falls in the past year? 0  Number falls in past yr: 0  Injury with Fall? 0  Risk for fall due to : No Fall Risks  Follow up Falls evaluation completed;Education provided;Falls prevention discussed   Last Audit-C alcohol use screening    07/25/2020  9:12 AM  Alcohol Use Disorder Test (AUDIT)  1. How often do you have a  drink containing alcohol? 4  2. How many drinks containing alcohol do you have on a typical day when you are drinking? 0  3. How often do you have six or more drinks on one occasion? 1  AUDIT-C Score 5  4. How often during the last year have you found that you were not able to stop drinking once you had started? 0  5. How often during the last year have you failed to do what was normally expected from you because of drinking? 0  6. How often during the last year have you needed a first drink in the morning to get yourself going after a heavy drinking session? 0  7. How often during the last year have you had a feeling of guilt of remorse after drinking? 0  8. How often during the last year have you been unable to remember what happened the night before because you had been drinking? 0  9. Have you or someone else been injured as a result of your drinking? 0  10. Has a relative or friend or a doctor or another health worker been concerned about your drinking or suggested you cut down? 0  Alcohol Use Disorder Identification Test Final Score (AUDIT) 5  Alcohol Brief Interventions/Follow-up AUDIT Score <7 follow-up not indicated   A score of 3 or more in women, and 4 or more in men indicates increased risk for alcohol abuse, EXCEPT if all of the points are from question 1   No results found for any visits on 07/26/21.  Assessment & Plan    Routine Health Maintenance and Physical Exam  Exercise Activities and Dietary recommendations  Goals   None     Immunization History  Administered Date(s) Administered   Influenza,inj,Quad PF,6+ Mos 03/13/2016, 03/26/2017, 04/07/2018, 04/06/2019   PFIZER(Purple Top)SARS-COV-2 Vaccination 05/04/2019, 05/25/2019   Tdap 02/09/2013, 04/22/2018    Health Maintenance  Topic Date Due   HIV Screening  Never done   Hepatitis C Screening  Never done   Zoster Vaccines- Shingrix (1 of 2) Never done   COVID-19 Vaccine (3 - Pfizer risk series) 06/22/2019    INFLUENZA VACCINE  11/27/2020   COLONOSCOPY (Pts 45-63yrs Insurance coverage will need to be confirmed)  01/18/2025   TETANUS/TDAP  04/22/2028   HPV VACCINES  Aged Out    Discussed health benefits of physical activity, and encouraged him to engage in regular exercise appropriate for his age and condition.  ***  No follow-ups on file.     {provider attestation***:1}   Megan Mans, MD  [facility] [phone] (phone) [phone] (fax)  [facility]

## 2021-07-26 ENCOUNTER — Encounter: Payer: Self-pay | Admitting: Family Medicine

## 2021-08-06 ENCOUNTER — Ambulatory Visit (INDEPENDENT_AMBULATORY_CARE_PROVIDER_SITE_OTHER): Payer: BC Managed Care – PPO

## 2021-08-06 DIAGNOSIS — I480 Paroxysmal atrial fibrillation: Secondary | ICD-10-CM

## 2021-08-07 LAB — CUP PACEART REMOTE DEVICE CHECK
Date Time Interrogation Session: 20230411091947
Implantable Pulse Generator Implant Date: 20210512

## 2021-08-16 ENCOUNTER — Other Ambulatory Visit: Payer: Self-pay | Admitting: Family Medicine

## 2021-08-16 DIAGNOSIS — E785 Hyperlipidemia, unspecified: Secondary | ICD-10-CM

## 2021-08-17 NOTE — Telephone Encounter (Signed)
Requested medication (s) are due for refill today: yes ? ?Requested medication (s) are on the active medication list: yes ? ?Last refill:  08/02/20 #90 3 rf ? ?Future visit scheduled: yes ? ?Notes to clinic:   lipid panel overdue  ? ? ?Requested Prescriptions  ?Pending Prescriptions Disp Refills  ? rosuvastatin (CRESTOR) 40 MG tablet [Pharmacy Med Name: ROSUVASTATIN CALCIUM 40 MG TAB] 90 tablet 0  ?  Sig: Take 1 tablet (40 mg total) by mouth daily.  ?  ? Cardiovascular:  Antilipid - Statins 2 Failed - 08/16/2021  1:11 PM  ?  ?  Failed - Cr in normal range and within 360 days  ?  Creat  ?Date Value Ref Range Status  ?01/24/2016 0.93 0.70 - 1.33 mg/dL Final  ?  Comment:  ?    ?For patients > or = 56 years of age: The upper reference limit for ?Creatinine is approximately 13% higher for people identified as ?African-American. ?  ?  ? ?Creatinine, Ser  ?Date Value Ref Range Status  ?12/11/2020 0.70 (L) 0.76 - 1.27 mg/dL Final  ?  ?  ?  ?  Failed - Lipid Panel in normal range within the last 12 months  ?  Cholesterol, Total  ?Date Value Ref Range Status  ?07/25/2020 151 100 - 199 mg/dL Final  ? ?LDL Cholesterol (Calc)  ?Date Value Ref Range Status  ?03/27/2017 123 (H) mg/dL (calc) Final  ?  Comment:  ?  Reference range: <100 ?Marland Kitchen ?Desirable range <100 mg/dL for primary prevention;   ?<70 mg/dL for patients with CHD or diabetic patients  ?with > or = 2 CHD risk factors. ?. ?LDL-C is now calculated using the Martin-Hopkins  ?calculation, which is a validated novel method providing  ?better accuracy than the Friedewald equation in the  ?estimation of LDL-C.  ?Cresenciano Genre et al. Annamaria Helling. 5456;256(38): 2061-2068  ?(http://education.[facility].com/faq/FAQ164) ?  ? ?LDL Chol Calc (NIH)  ?Date Value Ref Range Status  ?07/25/2020 79 0 - 99 mg/dL Final  ? ?HDL  ?Date Value Ref Range Status  ?07/25/2020 59 >39 mg/dL Final  ? ?Triglycerides  ?Date Value Ref Range Status  ?07/25/2020 68 0 - 149 mg/dL Final  ? ?  ?  ?  Passed - Patient is  not pregnant  ?  ?  Passed - Valid encounter within last 12 months  ?  Recent Outpatient Visits   ? ?      ? 1 month ago Acute rhinosinusitis  ? [facility] [place], [place], [place]  ? 8 months ago Hematuria, unspecified type  ? [facility] Jerrol Banana., MD  ? 1 year ago Annual physical exam  ? [facility] Jerrol Banana., MD  ? 1 year ago Sinusitis, unspecified chronicity, unspecified location  ? [place], PA-C  ? 1 year ago Essential hypertension  ? [facility] Jerrol Banana., MD  ? ?  ?  ?Future Appointments   ? ?        ? In 3 months Jerrol Banana., MD [facility]  ? ?  ? ? ?  ?  ?  ? ? ? ? ?

## 2021-08-21 NOTE — Progress Notes (Signed)
Carelink Summary Report / Loop Recorder 

## 2021-09-10 ENCOUNTER — Ambulatory Visit (INDEPENDENT_AMBULATORY_CARE_PROVIDER_SITE_OTHER): Payer: BC Managed Care – PPO

## 2021-09-10 DIAGNOSIS — I480 Paroxysmal atrial fibrillation: Secondary | ICD-10-CM

## 2021-09-11 ENCOUNTER — Ambulatory Visit (HOSPITAL_COMMUNITY): Payer: BC Managed Care – PPO | Admitting: Nurse Practitioner

## 2021-09-12 LAB — CUP PACEART REMOTE DEVICE CHECK
Date Time Interrogation Session: 20230517105024
Implantable Pulse Generator Implant Date: 20210512

## 2021-09-18 ENCOUNTER — Ambulatory Visit (HOSPITAL_COMMUNITY)
Admission: RE | Admit: 2021-09-18 | Discharge: 2021-09-18 | Disposition: A | Discharge status: Home / Self Care | Payer: BC Managed Care – PPO | Source: Ambulatory Visit | Attending: Nurse Practitioner | Admitting: Nurse Practitioner

## 2021-09-18 ENCOUNTER — Encounter (HOSPITAL_COMMUNITY): Payer: Self-pay | Admitting: Nurse Practitioner

## 2021-09-18 VITALS — BP 138/84 | HR 85 | Ht 70.0 in | Wt 191.8 lb

## 2021-09-18 DIAGNOSIS — I10 Essential (primary) hypertension: Secondary | ICD-10-CM | POA: Insufficient documentation

## 2021-09-18 DIAGNOSIS — R0609 Other forms of dyspnea: Secondary | ICD-10-CM | POA: Diagnosis not present

## 2021-09-18 DIAGNOSIS — I4819 Other persistent atrial fibrillation: Secondary | ICD-10-CM | POA: Diagnosis present

## 2021-09-18 DIAGNOSIS — I2510 Atherosclerotic heart disease of native coronary artery without angina pectoris: Secondary | ICD-10-CM | POA: Insufficient documentation

## 2021-09-18 DIAGNOSIS — R0600 Dyspnea, unspecified: Secondary | ICD-10-CM | POA: Insufficient documentation

## 2021-09-18 DIAGNOSIS — I480 Paroxysmal atrial fibrillation: Secondary | ICD-10-CM

## 2021-09-18 MED ORDER — TAMSULOSIN HCL 0.4 MG PO CAPS: ORAL_CAPSULE | ORAL | Status: AC

## 2021-09-18 NOTE — Progress Notes (Addendum)
Primary Care Physician: Jorge [name]., MD Referring Physician: Dr. Rayann [name] Cardiology: Dr. Myrtie [name] II is a 56 y.o. male with a h/o persistent afib x 3 ablations, Maze procedure in 2021, and stent of LAD in 2019 in the afib clinic for f/u. He was seen last in November with Dr. Rayann [name] and reported that he was staying in rhythm since the Maze procedure. In the clinic today, he is in [place] and repots that afib has been quiet, confirmed on review of his [facility] reports.   His concern today is shortness of breath. He feels it is getting worse over the last several months. It is exertional. He noticed it a few weeks ago carrying heavy fertilizer bags, as well as walking up inclines and walking the golf course. No specific chest discomfort. His last LHC was in October of 2021 and showed a widely patent prox LAD stent  and mild to mod circ stenosis as well as mild to mod rt posterolateral artery stenosis.   Today, he denies symptoms of palpitations, chest pain, +shortness of breath, -orthopnea, PND, lower extremity edema, dizziness, presyncope, syncope, or neurologic sequela. Past Medical History:  Diagnosis Date   Allergic rhinitis, seasonal    Anticoagulant long-term use managed by cardiology   asa and xarelto   Bilateral nephrolithiasis    urologist--- dr Jorge [name] ([place] office)   Coronary artery disease cardiologist--- dr Jorge [name]   nuclear stress test 10-18-2016 w/ normal perfusion;  cardiac cath 12-23-2017, PCI w/ DES to midLAD and mild RCA/ moderate CFx diease nonobstructive , normal LVF;  ETT 07-13-2019 negative for ischemia   Dysrhythmia    Afib   History of kidney stones    Hyperlipidemia    Hypertension    followed by pcp and cardiology   OA (osteoarthritis)    knees   OSA on CPAP    12-31-2019  per pt uses cpap every night   PAF (paroxysmal atrial fibrillation) ([place]) 2011   followed by dr Jorge [name] (ep)---  first dx 2011;  s/p DCCV's and ablation x2 in  2015 and last one 03/ 2018, and s/p loop recorder 05/ 2021;   failed multaq, flecainide, and amiodarone   Pneumonia    Right knee meniscal tear    S/P ablation of atrial fibrillation followed by dr Jorge [name]   x3  last one 2018   S/P drug eluting coronary stent placement 12/23/2017   x1  to midLAD   S/P minimally-invasive maze operation for atrial fibrillation 03/30/2020   Complete bilateral atrial lesion set using cryothermy and bipolar radiofrequency ablation with clipping of LA appendage via right mini thoracotomy approach   SOB (shortness of breath)    12-31-2019  per pt only when he has atrial fib , otherwise no issues with sob with activities   Status post placement of implantable loop recorder 09/08/2019   followed by dr Jorge [name]   Past Surgical History:  Procedure Laterality Date   [place] N/A 11/02/2013   PVI by Dr Jorge [name]   ATRIAL FIBRILLATION ABLATION N/A 03/29/2014   PVI and CTI by Dr Jorge [name]   ATRIAL FIBRILLATION ABLATION N/A 07/04/2016   Procedure: Atrial Fibrillation Ablation;  Surgeon: Jorge Grayer, MD;  Location: [facility];  Service: Cardiovascular;  Laterality: N/A;   CARDIOVERSION N/A 10/15/2013   Procedure: CARDIOVERSION;  Surgeon: Jorge Records, MD;  Location: [facility];  Service: Cardiovascular;  Laterality: N/A;   CLIPPING OF ATRIAL  APPENDAGE  03/30/2020   Procedure: CLIPPING OF ATRIAL APPENDAGE USING ATRICURE ATRICLIP [id number];  Surgeon: Jorge Alberts, MD;  Location: [facility];  Service: Open Heart Surgery;;   COLONOSCOPY  2016   CORONARY STENT INTERVENTION N/A 12/23/2017   Procedure: CORONARY STENT INTERVENTION;  Surgeon: Jorge Blanks, MD;  Location: [facility];  Service: Cardiovascular;  Laterality: N/A;   EXTRACORPOREAL SHOCK WAVE LITHOTRIPSY  2005;  2013   implantable loop recorder placement  09/08/2019   (done in office)   Medtronic Reveal Linq model LNQ 22 (SN [id number]) implantable loop recorder  implanted in office by Dr Jorge [name] for afib management   INGUINAL HERNIA REPAIR Left 05/31/2015   Procedure: HERNIA REPAIR INGUINAL ADULT;  Surgeon: Jorge Bellow, MD;  Location: [facility];  Service: General;  Laterality: Left;   KNEE ARTHROSCOPY Left 2009   '@[facility]'$    KNEE ARTHROSCOPY WITH MEDIAL MENISECTOMY Right 01/04/2020   Procedure: KNEE ARTHROSCOPY WITH PARTIAL MEDIAL MENISECTOMY;  Surgeon: Jorge Stairs, MD;  Location: [facility];  Service: Orthopedics;  Laterality: Right;   MINIMALLY INVASIVE MAZE PROCEDURE N/A 03/30/2020   Procedure: MINIMALLY INVASIVE MAZE PROCEDURE;  Surgeon: Jorge Alberts, MD;  Location: [place];  Service: Open Heart Surgery;  Laterality: N/A;   RHINOPLASTY  2006    '@[facility]'$    RIGHT/LEFT HEART CATH AND CORONARY ANGIOGRAPHY N/A 12/23/2017   Procedure: RIGHT/LEFT HEART CATH AND CORONARY ANGIOGRAPHY;  Surgeon: Jorge Blanks, MD;  Location: [facility];  Service: Cardiovascular;  Laterality: N/A;   RIGHT/LEFT HEART CATH AND CORONARY ANGIOGRAPHY N/A 02/02/2020   Procedure: RIGHT/LEFT HEART CATH AND CORONARY ANGIOGRAPHY;  Surgeon: Jorge Blanks, MD;  Location: [facility];  Service: Cardiovascular;  Laterality: N/A;   ROBOT ASSISTED INGUINAL HERNIA REPAIR Left 05-01-2013   '@[facility]'$    TEE WITHOUT CARDIOVERSION N/A 11/01/2013   Procedure: TRANSESOPHAGEAL ECHOCARDIOGRAM (TEE);  Surgeon: Jorge Furbish, MD;  Location: [facility];  Service: Cardiovascular;  Laterality: N/A;   TEE WITHOUT CARDIOVERSION N/A 03/28/2014   Procedure: TRANSESOPHAGEAL ECHOCARDIOGRAM (TEE);  Surgeon: Jorge Dresser, MD;  Location: [facility];  Service: Cardiovascular;  Laterality: N/A;   TEE WITHOUT CARDIOVERSION N/A 02/02/2020   Procedure: TRANSESOPHAGEAL ECHOCARDIOGRAM (TEE);  Surgeon: Jorge Lean, MD;  Location: [facility];  Service: Cardiovascular;  Laterality: N/A;   TEE WITHOUT CARDIOVERSION N/A 03/30/2020   Procedure: TRANSESOPHAGEAL  ECHOCARDIOGRAM (TEE);  Surgeon: Jorge Alberts, MD;  Location: [facility] [place];  Service: Open Heart Surgery;  Laterality: N/A;    Current Outpatient Medications  Medication Sig Dispense Refill   aspirin 81 MG EC tablet Take 81 mg by mouth daily.      aspirin-acetaminophen-caffeine (EXCEDRIN MIGRAINE) 250-250-65 MG tablet Take 2 tablets by mouth every 6 (six) hours as needed for headache.      azelastine (ASTELIN) 0.1 % nasal spray Place 1 spray into both nostrils 2 (two) times daily. 30 mL 12   fluticasone (FLONASE) 50 MCG/ACT nasal spray Place 2 sprays into both nostrils daily. 16 g 5   ibuprofen (ADVIL) 200 MG tablet Take 800 mg by mouth every 6 (six) hours as needed for headache or moderate pain.     montelukast (SINGULAIR) 10 MG tablet Take 1 tablet (10 mg total) by mouth at bedtime. 90 tablet 3   rosuvastatin (CRESTOR) 40 MG tablet Take 1 tablet (40 mg total) by mouth daily. 90 tablet 0   traMADol (ULTRAM) 50 MG tablet Take 1-2 tablets (50-100 mg total) by  mouth every 4 (four) hours as needed for moderate pain. 28 tablet 0   tamsulosin (FLOMAX) 0.4 MG CAPS capsule Taking one capsule by mouth as needed     No current facility-administered medications for this encounter.    Allergies  Allergen Reactions   Hydrocodone Nausea And Vomiting   Oxycodone Nausea And Vomiting    Social History   Socioeconomic History   Marital status: Married    Spouse name: Not on file   Number of children: Not on file   Years of education: Not on file   Highest education level: Not on file  Occupational History   Occupation: Passenger transport manager w/ [employer]  Tobacco Use   Smoking status: Never   Smokeless tobacco: Never  Vaping Use   Vaping Use: Never used  Substance and Sexual Activity   Alcohol use: Yes    Alcohol/week: 2.0 - 3.0 standard drinks    Types: 2 - 3 Cans of beer per week    Comment: occasionally   Drug use: Never   Sexual activity: Yes  Other Topics Concern   Not on file  Social  History Narrative   Lives in [place] with his spouse.  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[employer]   Social Determinants of Health   Financial Resource Strain: Not on file  Food Insecurity: Not on file  Transportation Needs: Not on file  Physical Activity: Not on file  Stress: Not on file  Social Connections: Not on file  Intimate Partner Violence: Not on file    Family History  Problem Relation Age of Onset   Cancer Mother        breast, cause of death   Heart attack Maternal Grandfather Jul 18, 2051       Cause of death   Hypertension Maternal Grandfather    Heart failure Brother    CVA Paternal Grandmother        cause of death   Atrial fibrillation Maternal Grandmother    Colon polyps Other        Fam Hx   Kidney cancer Neg Hx    Bladder Cancer Neg Hx    Prostate cancer Neg Hx     ROS- All systems are reviewed and negative except as per the HPI above  Physical Exam: Vitals:   09/18/21 0951  BP: 138/84  Pulse: 85  Weight: 87 kg  Height: '5\' 10"'$  (1.778 m)   Wt Readings from Last 3 Encounters:  09/18/21 87 kg  03/09/21 87.5 kg  12/11/20 89.4 kg    Labs: Lab Results  Component Value Date   NA 143 12/11/2020   K 3.9 12/11/2020   CL 106 12/11/2020   CO2 21 12/11/2020   GLUCOSE 91 12/11/2020   BUN 19 12/11/2020   CREATININE 0.70 (L) 12/11/2020   CALCIUM 9.4 12/11/2020   PHOS 2.7 (L) 06/29/2018   MG 2.1 04/04/2020   Lab Results  Component Value Date   INR 1.4 (H) 03/30/2020   Lab Results  Component Value Date   CHOL 151 07/25/2020   HDL 59 07/25/2020   LDLCALC 79 07/25/2020   TRIG 68 07/25/2020     GEN- The patient is well appearing, alert and oriented x 3 today.   Head- normocephalic, atraumatic Eyes-  Sclera clear, conjunctiva pink Ears- hearing intact Oropharynx- clear Neck- supple, no JVP Lymph- no cervical lymphadenopathy Lungs- Clear to ausculation bilaterally, normal work of breathing Heart- Regular rate and rhythm, no murmurs,  rubs or gallops,  PMI not laterally displaced GI- soft, NT, ND, + BS Extremities- no clubbing, cyanosis, or edema MS- no significant deformity or atrophy Skin- no rash or lesion Psych- euthymic mood, full affect Neuro- strength and sensation are intact  EKG-Vent. rate 85 BPM PR interval 140 ms QRS duration 88 ms QT/QTcB 380/452 ms P-R-T axes 68 41 53 Normal sinus rhythm Normal ECG When compared with ECG of 10-Apr-2020 11:26, PREVIOUS ECG IS PRESENT  LHC- 01/2020  -Ost 1st Diag lesion is 20% stenosed. Previously placed Prox LAD drug eluting stent is widely patent. Balloon angioplasty was performed. Ost 2nd Mrg lesion is 30% stenosed. Prox Cx lesion is 30% stenosed. Mid Cx lesion is 50% stenosed. Mid RCA lesion is 30% stenosed. RPAV lesion is 40% stenosed.   1. Patent mid LAD stent with no restenosis 2. Mild to moderate mid Circumflex stenosis, unchanged from last cath 3. Mild to moderate right posterolateral artery stenosis   Recommendations: Continue medical management of CAD. No further ischemic workup. Proceed with planned surgical procedure.     Assessment and Plan:  1. Afib Quiet since Maze 2021 No rate control meds on board Continue with Linq reports  Confirm low burden less thatn 1%  S/p LAA clip and is therefore not taking Bruce  2. HTN Stable   3. CAD Worrisome for anginal equivalent with his exertional dyspnea noted over the last several months  Will request  a timely  office  visit with Dr. Angelena [name]   F/u as needed with afib clinic   Geroge Baseman. Terril Amaro, [place] [facility] [address] [place], [place] [address] [phone]

## 2021-09-19 ENCOUNTER — Telehealth: Payer: Self-pay | Admitting: *Deleted

## 2021-09-19 NOTE — Telephone Encounter (Signed)
Left message for patient to call back  

## 2021-09-19 NOTE — Telephone Encounter (Signed)
-----   Message from Burnell Blanks, MD sent at 09/19/2021  9:57 AM EDT ----- Thanks Butch Penny, We will get him into see [place].   Carizma Dunsworth, Can we put him on next week on the structural day?  Gerald Stabs  ----- Message ----- From: Sherran Needs, NP Sent: 09/18/2021  11:06 AM EDT To: Burnell Blanks, MD  [facility] Dr. Angelena Form, I just saw Mr. Ray and I am concerned with his CAD and his exertional dyspnea that he is  also concerned about. I have requested a timely appointment with you to evaluate. Last cath was in 2021 with a widely patent  LAD  stent but he had other disease.  He is staying in rhythm so afib is not a factor with his current symptoms.  Thank you!

## 2021-09-20 NOTE — Telephone Encounter (Signed)
Pt scheduled w Dr. Angelena Form when he called back to scheduling this morning.

## 2021-09-26 ENCOUNTER — Ambulatory Visit: Payer: BC Managed Care – PPO | Admitting: Cardiovascular Disease

## 2021-09-27 NOTE — Progress Notes (Unsigned)
No chief complaint on file.  History of Present Illness: 56 yo male with history of persistent atrial fibrillation, HTN, hyperlipidemia, sleep apnea and CAD here today for cardiac follow up. He began having chest pain and dyspnea in the summer of 2019. Nuclear stress test was low risk. He continued to have symptoms and Dr. Rayann Heman referred him for a cardiac cath on 12/23/17. Cardiac cath with severe stenosis in the mid LAD treated with a drug eluting stent. There was mild disease in the RCA and moderate non-obstructive disease in the Circumflex. He reported dyspnea in 2020 and had a normal exercise stress test at that time. Cardiac cath in October 2021 with patent LAD stent and mild to moderate disease in the Circumflex and posterolateral artery that was unchanged from prior cath and not felt to be flow limiting. His atrial fibrillation is followed by Dr. Rayann Heman. He has had 3 atrial fib ablations and had a Maze procedure and left atrial appendage clip in 2021. Anti-coagulation has been stopped. A loop recorder is in place. He is a retired Emergency planning/management officer.    He is here today for follow up. The patient denies any chest pain, palpitations, lower extremity edema, orthopnea, PND, dizziness, near syncope or syncope. *** He has been having progressive dyspnea with exertion.   Primary Care Physician: Jerrol Banana., MD  Past Medical History:  Diagnosis Date   Allergic rhinitis, seasonal    Anticoagulant long-term use managed by cardiology   asa and xarelto   Bilateral nephrolithiasis    urologist--- dr Junious Silk ([place] office)   Coronary artery disease cardiologist--- dr Angelena Form   nuclear stress test 10-18-2016 w/ normal perfusion;  cardiac cath 12-23-2017, PCI w/ DES to midLAD and mild RCA/ moderate CFx diease nonobstructive , normal LVF;  ETT 07-13-2019 negative for ischemia   Dysrhythmia    Afib   History of kidney stones    Hyperlipidemia    Hypertension    followed by pcp and  cardiology   OA (osteoarthritis)    knees   OSA on CPAP    12-31-2019  per pt uses cpap every night   PAF (paroxysmal atrial fibrillation) ([place]) 2011   followed by dr Rayann Heman (ep)---  first dx 2011;  s/p DCCV's and ablation x2 in 2015 and last one 03/ 2018, and s/p loop recorder 05/ 2021;   failed multaq, flecainide, and amiodarone   Pneumonia    Right knee meniscal tear    S/P ablation of atrial fibrillation followed by dr Rayann Heman   x3  last one 2018   S/P drug eluting coronary stent placement 12/23/2017   x1  to midLAD   S/P minimally-invasive maze operation for atrial fibrillation 03/30/2020   Complete bilateral atrial lesion set using cryothermy and bipolar radiofrequency ablation with clipping of LA appendage via right mini thoracotomy approach   SOB (shortness of breath)    12-31-2019  per pt only when he has atrial fib , otherwise no issues with sob with activities   Status post placement of implantable loop recorder 09/08/2019   followed by dr Rayann Heman    Past Surgical History:  Procedure Laterality Date   [place] N/A 11/02/2013   PVI by Dr Rayann Heman   ATRIAL FIBRILLATION ABLATION N/A 03/29/2014   PVI and CTI by Dr Rayann Heman   ATRIAL FIBRILLATION ABLATION N/A 07/04/2016   Procedure: Atrial Fibrillation Ablation;  Surgeon: Thompson Grayer, MD;  Location: [facility];  Service:  Cardiovascular;  Laterality: N/A;   CARDIOVERSION N/A 10/15/2013   Procedure: CARDIOVERSION;  Surgeon: Fay Records, MD;  Location: [facility] END[facility]COPY;  Service: Cardiovascular;  Laterality: N/A;   CLIPPING OF ATRIAL APPENDAGE  03/30/2020   Procedure: CLIPPING OF ATRIAL APPENDAGE USING ATRICURE ATRICLIP [id number];  Surgeon: Rexene Alberts, MD;  Location: [place];  Service: Open Heart Surgery;;   COLONOSCOPY  2016   CORONARY STENT INTERVENTION N/A 12/23/2017   Procedure: CORONARY STENT INTERVENTION;  Surgeon: Burnell Blanks, MD;  Location: [place] [facility];  Service:  Cardiovascular;  Laterality: N/A;   EXTRACORPOREAL SHOCK WAVE LITHOTRIPSY  2005;  2013   implantable loop recorder placement  09/08/2019   (done in office)   Medtronic Reveal Linq model LNQ 22 (SN [id number]) implantable loop recorder implanted in office by Dr Rayann Heman for afib management   INGUINAL HERNIA REPAIR Left 05/31/2015   Procedure: HERNIA REPAIR INGUINAL ADULT;  Surgeon: Robert Bellow, MD;  Location: [facility];  Service: General;  Laterality: Left;   KNEE ARTHROSCOPY Left 2009   '@[facility]'$    KNEE ARTHROSCOPY WITH MEDIAL MENISECTOMY Right 01/04/2020   Procedure: KNEE ARTHROSCOPY WITH PARTIAL MEDIAL MENISECTOMY;  Surgeon: Nicholes Stairs, MD;  Location: [facility];  Service: Orthopedics;  Laterality: Right;   MINIMALLY INVASIVE MAZE PROCEDURE N/A 03/30/2020   Procedure: MINIMALLY INVASIVE MAZE PROCEDURE;  Surgeon: Rexene Alberts, MD;  Location: [place];  Service: Open Heart Surgery;  Laterality: N/A;   RHINOPLASTY  2006    '@[facility]'$    RIGHT/LEFT HEART CATH AND CORONARY ANGIOGRAPHY N/A 12/23/2017   Procedure: RIGHT/LEFT HEART CATH AND CORONARY ANGIOGRAPHY;  Surgeon: Burnell Blanks, MD;  Location: [place] [facility];  Service: Cardiovascular;  Laterality: N/A;   RIGHT/LEFT HEART CATH AND CORONARY ANGIOGRAPHY N/A 02/02/2020   Procedure: RIGHT/LEFT HEART CATH AND CORONARY ANGIOGRAPHY;  Surgeon: Burnell Blanks, MD;  Location: [place] [facility];  Service: Cardiovascular;  Laterality: N/A;   ROBOT ASSISTED INGUINAL HERNIA REPAIR Left 05-01-2013   '@[facility]'$    TEE WITHOUT CARDIOVERSION N/A 11/01/2013   Procedure: TRANSESOPHAGEAL ECHOCARDIOGRAM (TEE);  Surgeon: Candee Furbish, MD;  Location: [facility] ENDOSCOPY;  Service: Cardiovascular;  Laterality: N/A;   TEE WITHOUT CARDIOVERSION N/A 03/28/2014   Procedure: TRANSESOPHAGEAL ECHOCARDIOGRAM (TEE);  Surgeon: Larey Dresser, MD;  Location: [place];  Service: Cardiovascular;  Laterality: N/A;   TEE WITHOUT CARDIOVERSION N/A 02/02/2020    Procedure: TRANSESOPHAGEAL ECHOCARDIOGRAM (TEE);  Surgeon: Werner Lean, MD;  Location: [facility]COPY;  Service: Cardiovascular;  Laterality: N/A;   TEE WITHOUT CARDIOVERSION N/A 03/30/2020   Procedure: TRANSESOPHAGEAL ECHOCARDIOGRAM (TEE);  Surgeon: Rexene Alberts, MD;  Location[place];  Service: Open Heart Surgery;  Laterality: N/A;    Current Outpatient Medications  Medication Sig Dispense Refill   aspirin 81 MG EC tablet Take 81 mg by mouth daily.      aspirin-acetaminophen-caffeine (EXCEDRIN MIGRAINE) 250-250-65 MG tablet Take 2 tablets by mouth every 6 (six) hours as needed for headache.      azelastine (ASTELIN) 0.1 % nasal spray Place 1 spray into both nostrils 2 (two) times daily. 30 mL 12   fluticasone (FLONASE) 50 MCG/ACT nasal spray Place 2 sprays into both nostrils daily. 16 g 5   ibuprofen (ADVIL) 200 MG tablet Take 800 mg by mouth every 6 (six) hours as needed for headache or moderate pain.     montelukast (SINGULAIR) 10 MG tablet Take 1 tablet (10 mg total) by mouth at bedtime. 90 tablet  3   rosuvastatin (CRESTOR) 40 MG tablet Take 1 tablet (40 mg total) by mouth daily. 90 tablet 0   tamsulosin (FLOMAX) 0.4 MG CAPS capsule Taking one capsule by mouth as needed     traMADol (ULTRAM) 50 MG tablet Take 1-2 tablets (50-100 mg total) by mouth every 4 (four) hours as needed for moderate pain. 28 tablet 0   No current facility-administered medications for this visit.    Allergies  Allergen Reactions   Hydrocodone Nausea And Vomiting   Oxycodone Nausea And Vomiting    Social History   Socioeconomic History   Marital status: Married    Spouse name: Not on file   Number of children: Not on file   Years of education: Not on file   Highest education level: Not on file  Occupational History   Occupation: Passenger transport manager w/ [employer]  Tobacco Use   Smoking status: Never   Smokeless tobacco: Never  Vaping Use   Vaping Use: Never used  Substance and Sexual  Activity   Alcohol use: Yes    Alcohol/week: 2.0 - 3.0 standard drinks    Types: 2 - 3 Cans of beer per week    Comment: occasionally   Drug use: Never   Sexual activity: Yes  Other Topics Concern   Not on file  Social History Narrative   Lives in [place] with his spouse.  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[employer]   Social Determinants of Radio broadcast assistant Strain: Not on [employer] Insecurity: Not on file  Transportation Needs: Not on file  Physical Activity: Not on file  Stress: Not on file  Social Connections: Not on file  Intimate Partner Violence: Not on file    Family History  Problem Relation Age of Onset   Cancer Mother        breast, cause of death   Heart attack Maternal Grandfather 07-15-51       Cause of death   Hypertension Maternal Grandfather    Heart failure Brother    CVA Paternal Grandmother        cause of death   Atrial fibrillation Maternal Grandmother    Colon polyps Other        Fam Hx   Kidney cancer Neg Hx    Bladder Cancer Neg Hx    Prostate cancer Neg Hx     Review of Systems:  As stated in the HPI and otherwise negative.   There were no vitals taken for this visit.  Physical Examination: General: Well developed, well nourished, NAD  HEENT: OP clear, mucus membranes moist  SKIN: warm, dry. No rashes. Neuro: No focal deficits  Musculoskeletal: Muscle strength 5/5 all ext  Psychiatric: Mood and affect normal  Neck: No JVD, no carotid bruits, no thyromegaly, no lymphadenopathy.  Lungs:Clear bilaterally, no wheezes, rhonci, crackles Cardiovascular: Regular rate and rhythm. No murmurs, gallops or rubs. Abdomen:Soft. Bowel sounds present. Non-tender.  Extremities: No lower extremity edema. Pulses are 2 + in the bilateral DP/PT.  EKG:  EKG is *** ordered today. The ekg ordered today demonstrates    Recent Labs: 12/11/2020: ALT 28; BUN 19; Creatinine, Ser 0.70; Hemoglobin 12.8; Platelets 232; Potassium 3.9; Sodium 143    Lipid Panel    Component Value Date/Time   CHOL 151 07/25/2020 0953   TRIG 68 07/25/2020 0953   HDL 59 07/25/2020 0953   CHOLHDL 2.6 07/25/2020 0953   CHOLHDL 3.6 03/27/2017 0841   LDLCALC 79  07/25/2020 0953   LDLCALC 123 (H) 03/27/2017 0841     Wt Readings from Last 3 Encounters:  09/18/21 191 lb 12.8 oz (87 kg)  03/09/21 193 lb (87.5 kg)  12/11/20 197 lb (89.4 kg)     Other studies Reviewed: Additional studies/ records that were reviewed today include: . Review of the above records demonstrates:    Assessment and Plan:   1. CAD without angina: No chest pain but he has had progressive dyspnea with exertion. Cannot exclude this as an anginal equivalent. ***.  Continue ASA and statin.   2. Atrial fibrillation, paroxysmal: Sinus today on exam. He is followed closely in our atrial fibrillation clinic. His anti-coagulation has been stopped by Dr. Rayann Heman  3. HTN: BP is controlled. No changes today  4. Aortic insufficiency: Mild by TEE in 2021. Repeat echo now.   Current medicines are reviewed at length with the patient today.  The patient does not have concerns regarding medicines.  The following changes have been made:  no change  Labs/ tests ordered today include:   No orders of the defined types were placed in this encounter.    Disposition:   FU with me in 12 months   Signed, Lauree Chandler, MD 09/27/2021 9:41 AM    [facility] [place], [place], [place]  [address] Phone: [phone]; Fax: [phone]

## 2021-09-27 NOTE — Progress Notes (Signed)
Carelink Summary Report / Loop Recorder 

## 2021-09-28 ENCOUNTER — Encounter: Payer: Self-pay | Admitting: Cardiovascular Disease

## 2021-09-28 ENCOUNTER — Ambulatory Visit: Payer: BC Managed Care – PPO | Admitting: Cardiovascular Disease

## 2021-09-28 VITALS — BP 132/82 | HR 75 | Ht 70.0 in | Wt 195.0 lb

## 2021-09-28 DIAGNOSIS — I10 Essential (primary) hypertension: Secondary | ICD-10-CM

## 2021-09-28 DIAGNOSIS — I2510 Atherosclerotic heart disease of native coronary artery without angina pectoris: Secondary | ICD-10-CM

## 2021-09-28 DIAGNOSIS — I351 Nonrheumatic aortic (valve) insufficiency: Secondary | ICD-10-CM

## 2021-09-28 DIAGNOSIS — R0609 Other forms of dyspnea: Secondary | ICD-10-CM | POA: Diagnosis not present

## 2021-09-28 DIAGNOSIS — I4819 Other persistent atrial fibrillation: Secondary | ICD-10-CM | POA: Diagnosis not present

## 2021-09-28 NOTE — Patient Instructions (Addendum)
Medication Instructions:  No changes *If you need a refill on your cardiac medications before your next appointment, please call your pharmacy*   Lab Work: none   Testing/Procedures: Your physician has requested that you have an echocardiogram. Echocardiography is a painless test that uses sound waves to create images of your heart. It provides your doctor with information about the size and shape of your heart and how well your heart's chambers and valves are working. This procedure takes approximately one hour. There are no restrictions for this procedure.  Your physician has requested that you have an exercise tolerance test. For further information please visit [email or web address]. Please also follow instruction sheet, as given.   Follow-Up: At [facility], you and your health needs are our priority.  As part of our continuing mission to provide you with exceptional heart care, we have created designated Provider Care Teams.  These Care Teams include your primary Cardiologist (physician) and Advanced Practice Providers (APPs -  Physician Assistants and Nurse Practitioners) who all work together to provide you with the care you need, when you need it.   Your next appointment:   4-6 weeks  The format for your next appointment:   In Person  Provider:   Lauree Chandler, MD     Important Information About Sugar

## 2021-10-01 ENCOUNTER — Encounter (HOSPITAL_COMMUNITY): Payer: Self-pay

## 2021-10-01 ENCOUNTER — Ambulatory Visit (HOSPITAL_COMMUNITY): Payer: BC Managed Care – PPO

## 2021-10-09 ENCOUNTER — Ambulatory Visit: Payer: BC Managed Care – PPO

## 2021-10-09 DIAGNOSIS — I2510 Atherosclerotic heart disease of native coronary artery without angina pectoris: Secondary | ICD-10-CM

## 2021-10-09 DIAGNOSIS — R0609 Other forms of dyspnea: Secondary | ICD-10-CM

## 2021-10-09 LAB — EXERCISE TOLERANCE TEST
Angina Index: 0
Duke Treadmill Score: 11
Estimated workload: 13.4
Exercise duration (min): 11 min
Exercise duration (sec): 9 s
MPHR: 164 {beats}/min
Peak HR: 141 {beats}/min
Percent HR: 85 %
RPE: 17
Rest HR: 78 {beats}/min
ST Depression (mm): 0 mm

## 2021-10-15 ENCOUNTER — Ambulatory Visit (INDEPENDENT_AMBULATORY_CARE_PROVIDER_SITE_OTHER): Payer: BC Managed Care – PPO

## 2021-10-15 ENCOUNTER — Ambulatory Visit (HOSPITAL_COMMUNITY): Payer: BC Managed Care – PPO | Attending: Internal Medicine

## 2021-10-15 DIAGNOSIS — R0609 Other forms of dyspnea: Secondary | ICD-10-CM | POA: Diagnosis not present

## 2021-10-15 DIAGNOSIS — I2510 Atherosclerotic heart disease of native coronary artery without angina pectoris: Secondary | ICD-10-CM

## 2021-10-15 DIAGNOSIS — I480 Paroxysmal atrial fibrillation: Secondary | ICD-10-CM | POA: Diagnosis not present

## 2021-10-15 DIAGNOSIS — I351 Nonrheumatic aortic (valve) insufficiency: Secondary | ICD-10-CM

## 2021-10-16 LAB — ECHOCARDIOGRAM COMPLETE
Area-P 1/2: 3.4 cm2
P 1/2 time: 605 msec
S' Lateral: 2.9 cm

## 2021-10-16 LAB — CUP PACEART REMOTE DEVICE CHECK
Date Time Interrogation Session: 20230620094425
Implantable Pulse Generator Implant Date: 20210512

## 2021-11-01 NOTE — Progress Notes (Signed)
Carelink Summary Report / Loop Recorder 

## 2021-11-19 ENCOUNTER — Ambulatory Visit (INDEPENDENT_AMBULATORY_CARE_PROVIDER_SITE_OTHER): Payer: BC Managed Care – PPO

## 2021-11-19 DIAGNOSIS — I480 Paroxysmal atrial fibrillation: Secondary | ICD-10-CM | POA: Diagnosis not present

## 2021-11-19 LAB — CUP PACEART REMOTE DEVICE CHECK
Date Time Interrogation Session: 20230724130148
Implantable Pulse Generator Implant Date: 20210512

## 2021-12-11 ENCOUNTER — Ambulatory Visit (INDEPENDENT_AMBULATORY_CARE_PROVIDER_SITE_OTHER): Payer: BC Managed Care – PPO | Admitting: Family Medicine

## 2021-12-11 ENCOUNTER — Encounter: Payer: Self-pay | Admitting: Family Medicine

## 2021-12-11 VITALS — BP 144/91 | HR 84 | Resp 16 | Ht 70.0 in | Wt 191.0 lb

## 2021-12-11 DIAGNOSIS — I480 Paroxysmal atrial fibrillation: Secondary | ICD-10-CM

## 2021-12-11 DIAGNOSIS — I10 Essential (primary) hypertension: Secondary | ICD-10-CM

## 2021-12-11 DIAGNOSIS — J301 Allergic rhinitis due to pollen: Secondary | ICD-10-CM

## 2021-12-11 DIAGNOSIS — Z0000 Encounter for general adult medical examination without abnormal findings: Secondary | ICD-10-CM

## 2021-12-11 DIAGNOSIS — Z23 Encounter for immunization: Secondary | ICD-10-CM

## 2021-12-11 DIAGNOSIS — I2510 Atherosclerotic heart disease of native coronary artery without angina pectoris: Secondary | ICD-10-CM | POA: Diagnosis not present

## 2021-12-11 DIAGNOSIS — Z125 Encounter for screening for malignant neoplasm of prostate: Secondary | ICD-10-CM

## 2021-12-11 DIAGNOSIS — E785 Hyperlipidemia, unspecified: Secondary | ICD-10-CM | POA: Diagnosis not present

## 2021-12-11 NOTE — Progress Notes (Unsigned)
Complete physical exam  I,April Miller,acting as a scribe for Wilhemena Durie, MD.,have documented all relevant documentation on the behalf of Wilhemena Durie, MD,as directed by  Wilhemena Durie, MD while in the presence of Wilhemena Durie, MD.   Patient: Jorge [name]   DOB: [birth year]   56 y.o. Male  MRN: [id number] Visit Date: 12/11/2021  Today's healthcare provider: Wilhemena Durie, MD   Chief Complaint  Patient presents with   Annual Exam   Subjective    Jorge [name] is a 56 y.o. male who presents today for a complete physical exam.  He reports consuming a general diet. Home exercise routine includes walking, sit-ups. He generally feels well. He reports sleeping well. He does not have additional problems to discuss today.  HPI   Past Medical History:  Diagnosis Date   Allergic rhinitis, seasonal    Anticoagulant long-term use managed by cardiology   asa and xarelto   Bilateral nephrolithiasis    urologist--- dr Junious Silk ([place] office)   Coronary artery disease cardiologist--- dr Angelena Form   nuclear stress test 10-18-2016 w/ normal perfusion;  cardiac cath 12-23-2017, PCI w/ DES to midLAD and mild RCA/ moderate CFx diease nonobstructive , normal LVF;  ETT 07-13-2019 negative for ischemia   Dysrhythmia    Afib   History of kidney stones    Hyperlipidemia    Hypertension    followed by pcp and cardiology   OA (osteoarthritis)    knees   OSA on CPAP    12-31-2019  per pt uses cpap every night   PAF (paroxysmal atrial fibrillation) ([place]) 2011   followed by dr Rayann Heman (ep)---  first dx 2011;  s/p DCCV's and ablation x2 in 2015 and last one 03/ 2018, and s/p loop recorder 05/ 2021;   failed multaq, flecainide, and amiodarone   Pneumonia    Right knee meniscal tear    S/P ablation of atrial fibrillation followed by dr Rayann Heman   x3  last one 2018   S/P drug eluting coronary stent placement 12/23/2017   x1  to midLAD   S/P minimally-invasive  maze operation for atrial fibrillation 03/30/2020   Complete bilateral atrial lesion set using cryothermy and bipolar radiofrequency ablation with clipping of LA appendage via right mini thoracotomy approach   SOB (shortness of breath)    12-31-2019  per pt only when he has atrial fib , otherwise no issues with sob with activities   Status post placement of implantable loop recorder 09/08/2019   followed by dr Rayann Heman   Past Surgical History:  Procedure Laterality Date   [place] N/A 11/02/2013   PVI by Dr Rayann Heman   ATRIAL FIBRILLATION ABLATION N/A 03/29/2014   PVI and CTI by Dr Rayann Heman   ATRIAL FIBRILLATION ABLATION N/A 07/04/2016   Procedure: Atrial Fibrillation Ablation;  Surgeon: Thompson Grayer, MD;  Location: [place] CV LAB;  Service: Cardiovascular;  Laterality: N/A;   CARDIOVERSION N/A 10/15/2013   Procedure: CARDIOVERSION;  Surgeon: Fay Records, MD;  Location: [facility] ENDOSCOPY;  Service: Cardiovascular;  Laterality: N/A;   CLIPPING OF ATRIAL APPENDAGE  03/30/2020   Procedure: CLIPPING OF ATRIAL APPENDAGE USING ATRICURE ATRICLIP [id number];  Surgeon: Rexene Alberts, MD;  Location: [facility] OR;  Service: Open Heart Surgery;;   COLONOSCOPY  2016   CORONARY STENT INTERVENTION N/A 12/23/2017   Procedure: CORONARY STENT INTERVENTION;  Surgeon: Burnell Blanks, MD;  Location: [facility]  INVASIVE CV LAB;  Service: Cardiovascular;  Laterality: N/A;   EXTRACORPOREAL SHOCK WAVE LITHOTRIPSY  2005;  2013   implantable loop recorder placement  09/08/2019   (done in office)   Medtronic Reveal Linq model LNQ 22 (SN [id number]) implantable loop recorder implanted in office by Dr Rayann Heman for afib management   INGUINAL HERNIA REPAIR Left 05/31/2015   Procedure: HERNIA REPAIR INGUINAL ADULT;  Surgeon: Robert Bellow, MD;  Location: [facility] ORS;  Service: General;  Laterality: Left;   KNEE ARTHROSCOPY Left 2009   '@[facility]'$    KNEE ARTHROSCOPY WITH MEDIAL MENISECTOMY Right 01/04/2020    Procedure: KNEE ARTHROSCOPY WITH PARTIAL MEDIAL MENISECTOMY;  Surgeon: Nicholes Stairs, MD;  [facility];  Service: Orthopedics;  Laterality: Right;   MINIMALLY INVASIVE MAZE PROCEDURE N/A 03/30/2020   Procedure: MINIMALLY INVASIVE MAZE PROCEDURE;  Surgeon: Rexene Alberts, MD;  Location: [place];  Service: Open Heart Surgery;  Laterality: N/A;   RHINOPLASTY  2006    '@[facility]'$    RIGHT/LEFT HEART CATH AND CORONARY ANGIOGRAPHY N/A 12/23/2017   Procedure: RIGHT/LEFT HEART CATH AND CORONARY ANGIOGRAPHY;  Surgeon: Burnell Blanks, MD;  Location: [place] CV LAB;  Service: Cardiovascular;  Laterality: N/A;   RIGHT/LEFT HEART CATH AND CORONARY ANGIOGRAPHY N/A 02/02/2020   Procedure: RIGHT/LEFT HEART CATH AND CORONARY ANGIOGRAPHY;  Surgeon: Burnell Blanks, MD;  Location: [place] CV LAB;  Service: Cardiovascular;  Laterality: N/A;   ROBOT ASSISTED INGUINAL HERNIA REPAIR Left 05-01-2013   '@[facility]'$    TEE WITHOUT CARDIOVERSION N/A 11/01/2013   Procedure: TRANSESOPHAGEAL ECHOCARDIOGRAM (TEE);  Surgeon: Candee Furbish, MD;  Location: [facility] ENDOSCOPY;  Service: Cardiovascular;  Laterality: N/A;   TEE WITHOUT CARDIOVERSION N/A 03/28/2014   Procedure: TRANSESOPHAGEAL ECHOCARDIOGRAM (TEE);  Surgeon: Larey Dresser, MD;  Location: [place];  Service: Cardiovascular;  Laterality: N/A;   TEE WITHOUT CARDIOVERSION N/A 02/02/2020   Procedure: TRANSESOPHAGEAL ECHOCARDIOGRAM (TEE);  Surgeon: Werner Lean, MD;  Location: [facility] ENDOSCOPY;  Service: Cardiovascular;  Laterality: N/A;   TEE WITHOUT CARDIOVERSION N/A 03/30/2020   Procedure: TRANSESOPHAGEAL ECHOCARDIOGRAM (TEE);  Surgeon: Rexene Alberts, MD;  Location: [place];  Service: Open Heart Surgery;  Laterality: N/A;   Social History   Socioeconomic History   Marital status: Married    Spouse name: Not on file   Number of children: Not on file   Years of education: Not on file   Highest education level: Not on file   Occupational History   Occupation: Passenger transport manager w/ [employer]  Tobacco Use   Smoking status: Never   Smokeless tobacco: Never  Vaping Use   Vaping Use: Never used  Substance and Sexual Activity   Alcohol use: Yes    Alcohol/week: 2.0 - 3.0 standard drinks of alcohol    Types: 2 - 3 Cans of beer per week    Comment: occasionally   Drug use: Never   Sexual activity: Yes  Other Topics Concern   Not on file  Social History Narrative   Lives in [place] with his spouse.  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[employer]   Social Determinants of Radio broadcast assistant Strain: Not on [employer] Insecurity: Not on file  Transportation Needs: Not on file  Physical Activity: Not on file  Stress: Not on file  Social Connections: Not on file  Intimate Partner Violence: Not on file   Family Status  Relation Name Status   Mother  Deceased at age 51  MGF  Deceased at age 80       from MI   Brother  Alive       stable health, mental health issues   PGM  Deceased       From CVA   Father  Deceased at age 24       suicide   MGM  Deceased   Other  (Not Specified)   PGF  Deceased   Neg Hx  (Not Specified)   Family History  Problem Relation Age of Onset   Cancer Mother        breast, cause of death   Heart attack Maternal Grandfather 66       Cause of death   Hypertension Maternal Grandfather    Heart failure Brother    CVA Paternal Grandmother        cause of death   Atrial fibrillation Maternal Grandmother    Colon polyps Other        Fam Hx   Kidney cancer Neg Hx    Bladder Cancer Neg Hx    Prostate cancer Neg Hx    Allergies  Allergen Reactions   Hydrocodone Nausea And Vomiting   Oxycodone Nausea And Vomiting    Patient Care Team: Jerrol Banana., MD as PCP - General (Family Medicine) Thompson Grayer, MD as PCP - Electrophysiology (Cardiology) Burnell Blanks, MD as PCP - Cardiology (Cardiology) Jerrol Banana., MD (Family  Medicine) Bary Castilla Forest Gleason, MD (General Surgery)   Medications: Outpatient Medications Prior to Visit  Medication Sig   aspirin 81 MG EC tablet Take 81 mg by mouth daily.    aspirin-acetaminophen-caffeine (EXCEDRIN MIGRAINE) 250-250-65 MG tablet Take 2 tablets by mouth every 6 (six) hours as needed for headache.    azelastine (ASTELIN) 0.1 % nasal spray Place 1 spray into both nostrils 2 (two) times daily.   fluticasone (FLONASE) 50 MCG/ACT nasal spray Place 2 sprays into both nostrils daily.   ibuprofen (ADVIL) 200 MG tablet Take 800 mg by mouth every 6 (six) hours as needed for headache or moderate pain.   montelukast (SINGULAIR) 10 MG tablet Take 1 tablet (10 mg total) by mouth at bedtime.   rosuvastatin (CRESTOR) 40 MG tablet Take 1 tablet (40 mg total) by mouth daily.   tamsulosin (FLOMAX) 0.4 MG CAPS capsule Taking one capsule by mouth as needed   traMADol (ULTRAM) 50 MG tablet Take 1-2 tablets (50-100 mg total) by mouth every 4 (four) hours as needed for moderate pain.   No facility-administered medications prior to visit.    Review of Systems  HENT:  Positive for sinus pressure.   Musculoskeletal:  Positive for arthralgias and back pain.  All other systems reviewed and are negative.   {Labs  Heme  Chem  Endocrine  Serology  Results Review (optional):23779}  Objective     BP (!) 144/91 (BP Location: Right Arm, Patient Position: Sitting, Cuff Size: Large)   Pulse 84   Resp 16   Ht '5\' 10"'$  (1.778 m)   Wt 191 lb (86.6 kg)   SpO2 96%   BMI 27.41 kg/m  {Show previous vital signs (optional):23777}   Physical Exam  ***  Last depression screening scores    12/11/2021   10:27 AM 11/09/2020   12:18 PM 07/25/2020    9:12 AM  PHQ 2/9 Scores  PHQ - 2 Score 0 0 0  PHQ- 9 Score 3 1 0   Last fall risk screening  12/11/2021   10:27 AM  [place] in the past year? 0  Number falls in past yr: 0  Injury with Fall? 0  Risk for fall due to : No Fall Risks   Follow up Falls evaluation completed   Last Audit-C alcohol use screening    12/11/2021   10:27 AM  Alcohol Use Disorder Test (AUDIT)  1. How often do you have a drink containing alcohol? 2  2. How many drinks containing alcohol do you have on a typical day when you are drinking? 0  3. How often do you have six or more drinks on one occasion? 0  AUDIT-C Score 2   A score of 3 or more in women, and 4 or more in men indicates increased risk for alcohol abuse, EXCEPT if all of the points are from question 1   No results found for any visits on 12/11/21.  Assessment & Plan    Routine Health Maintenance and Physical Exam  Exercise Activities and Dietary recommendations  Goals   None     Immunization History  Administered Date(s) Administered   Influenza,inj,Quad PF,6+ Mos 03/13/2016, 03/26/2017, 04/07/2018, 04/06/2019   PFIZER(Purple Top)SARS-COV-2 Vaccination 05/04/2019, 05/25/2019   Tdap 02/09/2013, 04/22/2018    Health Maintenance  Topic Date Due   HIV Screening  Never done   Hepatitis C Screening  Never done   Zoster Vaccines- Shingrix (1 of 2) Never done   COVID-19 Vaccine (3 - Pfizer risk series) 06/22/2019   INFLUENZA VACCINE  11/27/2021   COLONOSCOPY (Pts 45-52yr Insurance coverage will need to be confirmed)  01/18/2025   TETANUS/TDAP  04/22/2028   HPV VACCINES  Aged Out    Discussed health benefits of physical activity, and encouraged him to engage in regular exercise appropriate for his age and condition.  ***  No follow-ups on file.     {provider attestation***:1}   RWilhemena Durie MD  BFort Myers Eye Surgery Center [phone](phone) [phone](fax)  [place]

## 2021-12-12 ENCOUNTER — Other Ambulatory Visit: Payer: Self-pay | Admitting: Family Medicine

## 2021-12-12 DIAGNOSIS — Z23 Encounter for immunization: Secondary | ICD-10-CM | POA: Diagnosis not present

## 2021-12-12 DIAGNOSIS — J301 Allergic rhinitis due to pollen: Secondary | ICD-10-CM

## 2021-12-12 LAB — COMPREHENSIVE METABOLIC PANEL
ALT: 28 IU/L (ref 0–44)
AST: 25 IU/L (ref 0–40)
Albumin/Globulin Ratio: 2.1 (ref 1.2–2.2)
Albumin: 5.1 g/dL — ABNORMAL HIGH (ref 3.8–4.9)
Alkaline Phosphatase: 62 IU/L (ref 44–121)
BUN/Creatinine Ratio: 18 (ref 9–20)
BUN: 14 mg/dL (ref 6–24)
Bilirubin Total: 0.8 mg/dL (ref 0.0–1.2)
CO2: 21 mmol/L (ref 20–29)
Calcium: 9.6 mg/dL (ref 8.7–10.2)
Chloride: 101 mmol/L (ref 96–106)
Creatinine, Ser: 0.8 mg/dL (ref 0.76–1.27)
Globulin, Total: 2.4 g/dL (ref 1.5–4.5)
Glucose: 97 mg/dL (ref 70–99)
Potassium: 4.3 mmol/L (ref 3.5–5.2)
Sodium: 140 mmol/L (ref 134–144)
Total Protein: 7.5 g/dL (ref 6.0–8.5)
eGFR: 104 mL/min/{1.73_m2} (ref >59)

## 2021-12-12 LAB — CBC WITH DIFFERENTIAL/PLATELET
Basophils Absolute: 0 10*3/uL (ref 0.0–0.2)
Basos: 1 %
EOS (ABSOLUTE): 0.1 10*3/uL (ref 0.0–0.4)
Eos: 2 %
Hematocrit: 40.1 % (ref 37.5–51.0)
Hemoglobin: 13.9 g/dL (ref 13.0–17.7)
Immature Grans (Abs): 0 10*3/uL (ref 0.0–0.1)
Immature Granulocytes: 0 %
Lymphocytes Absolute: 1.5 10*3/uL (ref 0.7–3.1)
Lymphs: 23 %
MCH: 30.8 pg (ref 26.6–33.0)
MCHC: 34.7 g/dL (ref 31.5–35.7)
MCV: 89 fL (ref 79–97)
Monocytes Absolute: 0.5 10*3/uL (ref 0.1–0.9)
Monocytes: 8 %
Neutrophils Absolute: 4.3 10*3/uL (ref 1.4–7.0)
Neutrophils: 66 %
Platelets: 230 10*3/uL (ref 150–450)
RBC: 4.51 x10E6/uL (ref 4.14–5.80)
RDW: 12.3 % (ref 11.6–15.4)
WBC: 6.4 10*3/uL (ref 3.4–10.8)

## 2021-12-12 LAB — LIPID PANEL
Chol/HDL Ratio: 2.7 ratio (ref 0.0–5.0)
Cholesterol, Total: 146 mg/dL (ref 100–199)
HDL: 55 mg/dL (ref >39)
LDL Chol Calc (NIH): 79 mg/dL (ref 0–99)
Triglycerides: 60 mg/dL (ref 0–149)
VLDL Cholesterol Cal: 12 mg/dL (ref 5–40)

## 2021-12-12 LAB — TSH: TSH: 1.03 u[IU]/mL (ref 0.450–4.500)

## 2021-12-12 LAB — MAGNESIUM: Magnesium: 2.1 mg/dL (ref 1.6–2.3)

## 2021-12-12 LAB — PSA: Prostate Specific Ag, Serum: 1.6 ng/mL (ref 0.0–4.0)

## 2021-12-19 NOTE — Progress Notes (Signed)
Carelink Summary Report / Loop Recorder 

## 2021-12-24 ENCOUNTER — Ambulatory Visit (INDEPENDENT_AMBULATORY_CARE_PROVIDER_SITE_OTHER): Payer: BC Managed Care – PPO

## 2021-12-24 DIAGNOSIS — I480 Paroxysmal atrial fibrillation: Secondary | ICD-10-CM

## 2021-12-25 LAB — CUP PACEART REMOTE DEVICE CHECK
Date Time Interrogation Session: 20230829075118
Implantable Pulse Generator Implant Date: 20210512

## 2022-01-16 NOTE — Progress Notes (Signed)
Carelink Summary Report / Loop Recorder 

## 2022-01-23 LAB — CUP PACEART REMOTE DEVICE CHECK
Date Time Interrogation Session: 20230926074332
Implantable Pulse Generator Implant Date: 20210512

## 2022-01-25 ENCOUNTER — Ambulatory Visit (INDEPENDENT_AMBULATORY_CARE_PROVIDER_SITE_OTHER): Payer: BC Managed Care – PPO

## 2022-01-25 DIAGNOSIS — I480 Paroxysmal atrial fibrillation: Secondary | ICD-10-CM | POA: Diagnosis not present

## 2022-01-30 NOTE — Progress Notes (Signed)
Carelink Summary Report / Loop Recorder 

## 2022-02-08 ENCOUNTER — Other Ambulatory Visit: Payer: Self-pay | Admitting: Family Medicine

## 2022-02-08 DIAGNOSIS — J301 Allergic rhinitis due to pollen: Secondary | ICD-10-CM

## 2022-02-08 NOTE — Telephone Encounter (Signed)
Requested medication (s) are due for refill today: yes  Requested medication (s) are on the active medication list: yes  Last refill:  12/12/21  Future visit scheduled:no  Notes to clinic:  Unable to refill per protocol, last refill by provider no longer at practice, routing for approval.     Requested Prescriptions  Pending Prescriptions Disp Refills   fluticasone (FLONASE) 50 MCG/ACT nasal spray [Pharmacy Med Name: FLUTICASONE PROP 50 MCG SPRAY] 16 g 0    Sig: Place 2 sprays into both nostrils daily.     Ear, Nose, and Throat: Nasal Preparations - Corticosteroids Passed - 02/08/2022 11:05 AM      Passed - Valid encounter within last 12 months    Recent Outpatient Visits           1 month ago Annual physical exam   [facility] Jerrol Banana., MD   7 months ago Acute rhinosinusitis   [facility] [place], [place], PA-C   1 year ago Hematuria, unspecified type   [facility] Jerrol Banana., MD   1 year ago Annual physical exam   [facility] Jerrol Banana., MD   1 year ago Sinusitis, unspecified chronicity, unspecified location   [facility] [place], V[place]ki Muff, [place]

## 2022-02-27 ENCOUNTER — Ambulatory Visit (INDEPENDENT_AMBULATORY_CARE_PROVIDER_SITE_OTHER): Payer: BC Managed Care – PPO

## 2022-02-27 DIAGNOSIS — I4819 Other persistent atrial fibrillation: Secondary | ICD-10-CM

## 2022-02-27 LAB — CUP PACEART REMOTE DEVICE CHECK
Date Time Interrogation Session: 20231031230318
Implantable Pulse Generator Implant Date: 20210512

## 2022-03-12 NOTE — Progress Notes (Signed)
Carelink Summary Report / Loop Recorder 

## 2022-03-18 ENCOUNTER — Other Ambulatory Visit: Payer: Self-pay | Admitting: Physician Assistant

## 2022-03-18 DIAGNOSIS — J301 Allergic rhinitis due to pollen: Secondary | ICD-10-CM

## 2022-03-19 NOTE — Telephone Encounter (Signed)
Requested Prescriptions  Pending Prescriptions Disp Refills   fluticasone (FLONASE) 50 MCG/ACT nasal spray [Pharmacy Med Name: FLUTICASONE PROP 50 MCG SPRAY] 16 g 0    Sig: Place 2 sprays into both nostrils daily.     Ear, Nose, and Throat: Nasal Preparations - Corticosteroids Passed - 03/18/2022 12:44 PM      Passed - Valid encounter within last 12 months    Recent Outpatient Visits           3 months ago Annual physical exam   [facility] Jerrol Banana., MD   8 months ago Acute rhinosinusitis   [facility] [place], [place], PA-C   1 year ago Hematuria, unspecified type   [facility] Jerrol Banana., MD   1 year ago Annual physical exam   [facility] Jerrol Banana., MD   1 year ago Sinusitis, unspecified chronicity, unspecified location   [place], Vickki Muff, [place]

## 2022-03-20 ENCOUNTER — Ambulatory Visit (INDEPENDENT_AMBULATORY_CARE_PROVIDER_SITE_OTHER): Payer: BC Managed Care – PPO | Admitting: Physician Assistant

## 2022-03-20 DIAGNOSIS — Z23 Encounter for immunization: Secondary | ICD-10-CM | POA: Diagnosis not present

## 2022-04-01 ENCOUNTER — Ambulatory Visit (INDEPENDENT_AMBULATORY_CARE_PROVIDER_SITE_OTHER): Payer: BC Managed Care – PPO

## 2022-04-01 DIAGNOSIS — I4819 Other persistent atrial fibrillation: Secondary | ICD-10-CM

## 2022-04-01 LAB — CUP PACEART REMOTE DEVICE CHECK
Date Time Interrogation Session: 20231203230724
Implantable Pulse Generator Implant Date: 20210512

## 2022-05-06 ENCOUNTER — Ambulatory Visit (INDEPENDENT_AMBULATORY_CARE_PROVIDER_SITE_OTHER): Payer: BC Managed Care – PPO

## 2022-05-06 DIAGNOSIS — I4819 Other persistent atrial fibrillation: Secondary | ICD-10-CM

## 2022-05-06 LAB — CUP PACEART REMOTE DEVICE CHECK
Date Time Interrogation Session: 20240107231046
Implantable Pulse Generator Implant Date: 20210512

## 2022-05-08 NOTE — Progress Notes (Signed)
Carelink Summary Report / Loop Recorder 

## 2022-06-06 ENCOUNTER — Encounter (HOSPITAL_COMMUNITY): Payer: Self-pay | Admitting: *Deleted

## 2022-06-07 NOTE — Progress Notes (Signed)
Carelink Summary Report / Loop Recorder 

## 2022-06-09 LAB — CUP PACEART REMOTE DEVICE CHECK
Date Time Interrogation Session: 20240209230534
Implantable Pulse Generator Implant Date: 20210512

## 2022-06-10 ENCOUNTER — Ambulatory Visit: Payer: BC Managed Care – PPO

## 2022-06-10 DIAGNOSIS — I4819 Other persistent atrial fibrillation: Secondary | ICD-10-CM | POA: Diagnosis not present

## 2022-07-15 ENCOUNTER — Ambulatory Visit (INDEPENDENT_AMBULATORY_CARE_PROVIDER_SITE_OTHER): Payer: BC Managed Care – PPO

## 2022-07-15 DIAGNOSIS — I4819 Other persistent atrial fibrillation: Secondary | ICD-10-CM

## 2022-07-16 LAB — CUP PACEART REMOTE DEVICE CHECK
Date Time Interrogation Session: 20240317232327
Implantable Pulse Generator Implant Date: 20210512

## 2022-07-23 NOTE — Progress Notes (Signed)
Carelink Summary Report / Loop Recorder 

## 2022-08-19 ENCOUNTER — Ambulatory Visit: Payer: BC Managed Care – PPO

## 2022-08-19 LAB — CUP PACEART REMOTE DEVICE CHECK
Date Time Interrogation Session: 20240419230526
Implantable Pulse Generator Implant Date: 20210512

## 2022-08-21 ENCOUNTER — Ambulatory Visit (INDEPENDENT_AMBULATORY_CARE_PROVIDER_SITE_OTHER): Payer: BC Managed Care – PPO

## 2022-08-21 DIAGNOSIS — I4819 Other persistent atrial fibrillation: Secondary | ICD-10-CM

## 2022-08-21 NOTE — Progress Notes (Signed)
Carelink Summary Report / Loop Recorder 

## 2022-09-16 ENCOUNTER — Ambulatory Visit
Admission: RE | Admit: 2022-09-16 | Discharge: 2022-09-16 | Disposition: A | Discharge status: Home / Self Care | Payer: BC Managed Care – PPO | Attending: Urology | Admitting: Urology

## 2022-09-16 ENCOUNTER — Ambulatory Visit: Payer: BC Managed Care – PPO | Admitting: Urology

## 2022-09-16 ENCOUNTER — Ambulatory Visit
Admission: RE | Admit: 2022-09-16 | Discharge: 2022-09-16 | Disposition: A | Discharge status: Home / Self Care | Payer: BC Managed Care – PPO | Source: Ambulatory Visit | Attending: Urology | Admitting: Urology

## 2022-09-16 ENCOUNTER — Other Ambulatory Visit: Payer: Self-pay

## 2022-09-16 ENCOUNTER — Encounter: Payer: Self-pay | Admitting: Urology

## 2022-09-16 ENCOUNTER — Other Ambulatory Visit
Admission: RE | Admit: 2022-09-16 | Discharge: 2022-09-16 | Disposition: A | Discharge status: Home / Self Care | Payer: BC Managed Care – PPO | Source: Home / Self Care | Attending: Urology | Admitting: Urology

## 2022-09-16 VITALS — BP 132/70 | HR 86 | Ht 70.0 in | Wt 195.6 lb

## 2022-09-16 DIAGNOSIS — Z87442 Personal history of urinary calculi: Secondary | ICD-10-CM

## 2022-09-16 DIAGNOSIS — R3129 Other microscopic hematuria: Secondary | ICD-10-CM | POA: Diagnosis not present

## 2022-09-16 DIAGNOSIS — N200 Calculus of kidney: Secondary | ICD-10-CM

## 2022-09-16 DIAGNOSIS — R109 Unspecified abdominal pain: Secondary | ICD-10-CM

## 2022-09-16 LAB — URINALYSIS, COMPLETE (UACMP) WITH MICROSCOPIC
Bilirubin Urine: NEGATIVE
Glucose, UA: NEGATIVE mg/dL
Nitrite: NEGATIVE
Specific Gravity, Urine: 1.01 (ref 1.005–1.030)
pH: 6 (ref 5.0–8.0)

## 2022-09-16 NOTE — Progress Notes (Unsigned)
09/16/2022 4:24 PM   Jorge [name] [birth year] [id number]  Referring provider: Bosie Clos, MD [address] [place],  [place] [address]  Urological history: 1.  Nephrolithiasis -ESWL 2005/2013 -CT w/wo (2021) - Nonobstructive calculi in the collecting systems of both kidneys measuring up to 4 mm in the lower pole collecting system of left kidney.  2. BPH  -PSA (2023) 1.6  Chief Complaint  Patient presents with   Nephrolithiasis    HPI: Jorge [name] is a 57 y.o. male who presents today for possible stone.   Previous records reviewed.   He is followed by Dr. Mena Goes and was last seen in our office in 2021.  He had episode of spontaneous passage of a kidney stone with gross heme.  UA yellow clear, specific gravity 1.010, pH 6.0, small hemoglobin, trace ketones, trace protein, trace leukocytes, 0-5 squamous epithelial cells, 6-10 WBCs, 11-20 RBCs, rare bacteria, mucus present hyaline cast present and calcium oxalate crystals are present  KUB ***  PMH: Past Medical History:  Diagnosis Date   Allergic rhinitis, seasonal    Anticoagulant long-term use managed by cardiology   asa and xarelto   Bilateral nephrolithiasis    urologist--- dr Mena Goes ([place] office)   Coronary artery disease cardiologist--- dr Clifton James   nuclear stress test 10-18-2016 w/ normal perfusion;  cardiac cath 12-23-2017, PCI w/ DES to midLAD and mild RCA/ moderate CFx diease nonobstructive , normal LVF;  ETT 07-13-2019 negative for ischemia   Dysrhythmia    Afib   History of kidney stones    Hyperlipidemia    Hypertension    followed by pcp and cardiology   OA (osteoarthritis)    knees   OSA on CPAP    12-31-2019  per pt uses cpap every night   PAF (paroxysmal atrial fibrillation) (HCC) 2011   followed by dr Johney Frame (ep)---  first dx 2011;  s/p DCCV's and ablation x2 in 2015 and last one 03/ 2018, and s/p loop recorder 05/ 2021;   failed multaq, flecainide, and amiodarone    Pneumonia    Right knee meniscal tear    S/P ablation of atrial fibrillation followed by dr Johney Frame   x3  last one 2018   S/P drug eluting coronary stent placement 12/23/2017   x1  to midLAD   S/P minimally-invasive maze operation for atrial fibrillation 03/30/2020   Complete bilateral atrial lesion set using cryothermy and bipolar radiofrequency ablation with clipping of LA appendage via right mini thoracotomy approach   SOB (shortness of breath)    12-31-2019  per pt only when he has atrial fib , otherwise no issues with sob with activities   Status post placement of implantable loop recorder 09/08/2019   followed by dr allred    Surgical History: Past Surgical History:  Procedure Laterality Date   APPENDECTOMY  1975   ATRIAL FIBRILLATION ABLATION N/A 11/02/2013   PVI by Dr Johney Frame   ATRIAL FIBRILLATION ABLATION N/A 03/29/2014   PVI and CTI by Dr Johney Frame   ATRIAL FIBRILLATION ABLATION N/A 07/04/2016   Procedure: Atrial Fibrillation Ablation;  Surgeon: Hillis Range, MD;  Location: [facility] INVASIVE CV LAB;  Service: Cardiovascular;  Laterality: N/A;   CARDIOVERSION N/A 10/15/2013   Procedure: CARDIOVERSION;  Surgeon: Pricilla Riffle, MD;  Location: [facility] ENDOSCOPY;  Service: Cardiovascular;  Laterality: N/A;   CLIPPING OF ATRIAL APPENDAGE  03/30/2020   Procedure: CLIPPING OF ATRIAL APPENDAGE USING ATRICURE ATRICLIP [id number];  Surgeon: Purcell Nails,  MD;  [facility] OR;  Service: Open Heart Surgery;;   COLONOSCOPY  2016   CORONARY STENT INTERVENTION N/A 12/23/2017   Procedure: CORONARY STENT INTERVENTION;  Surgeon: Kathleene Hazel, MD;  Location: [facility] INVASIVE CV LAB;  Service: Cardiovascular;  Laterality: N/A;   EXTRACORPOREAL SHOCK WAVE LITHOTRIPSY  2005;  2013   implantable loop recorder placement  09/08/2019   (done in office)   Medtronic Reveal Linq model LNQ 22 (SN [id number]) implantable loop recorder implanted in office by Dr Johney Frame for afib management   INGUINAL HERNIA REPAIR Left  05/31/2015   Procedure: HERNIA REPAIR INGUINAL ADULT;  Surgeon: Earline Mayotte, MD;  Location: [facility] ORS;  Service: General;  Laterality: Left;   KNEE ARTHROSCOPY Left 2009   @[facility]    KNEE ARTHROSCOPY WITH MEDIAL MENISECTOMY Right 01/04/2020   Procedure: KNEE ARTHROSCOPY WITH PARTIAL MEDIAL MENISECTOMY;  Surgeon: Yolonda Kida, MD;  Location: [facility];  Service: Orthopedics;  Laterality: Right;   MINIMALLY INVASIVE MAZE PROCEDURE N/A 03/30/2020   Procedure: MINIMALLY INVASIVE MAZE PROCEDURE;  Surgeon: Purcell Nails, MD;  Location: [facility] OR;  Service: Open Heart Surgery;  Laterality: N/A;   RHINOPLASTY  2006    @[facility]    RIGHT/LEFT HEART CATH AND CORONARY ANGIOGRAPHY N/A 12/23/2017   Procedure: RIGHT/LEFT HEART CATH AND CORONARY ANGIOGRAPHY;  Surgeon: Kathleene Hazel, MD;  [facility] INVASIVE CV LAB;  Service: Cardiovascular;  Laterality: N/A;   RIGHT/LEFT HEART CATH AND CORONARY ANGIOGRAPHY N/A 02/02/2020   Procedure: RIGHT/LEFT HEART CATH AND CORONARY ANGIOGRAPHY;  Surgeon: Kathleene Hazel, MD;  [facility] INVASIVE CV LAB;  Service: Cardiovascular;  Laterality: N/A;   ROBOT ASSISTED INGUINAL HERNIA REPAIR Left 05-01-2013   @[facility]    TEE WITHOUT CARDIOVERSION N/A 11/01/2013   Procedure: TRANSESOPHAGEAL ECHOCARDIOGRAM (TEE);  Surgeon: Donato Schultz, MD;  Location: [facility] ENDOSCOPY;  Service: Cardiovascular;  Laterality: N/A;   TEE WITHOUT CARDIOVERSION N/A 03/28/2014   Procedure: TRANSESOPHAGEAL ECHOCARDIOGRAM (TEE);  Surgeon: Laurey Morale, MD;  Location: [facility] ENDOSCOPY;  Service: Cardiovascular;  Laterality: N/A;   TEE WITHOUT CARDIOVERSION N/A 02/02/2020   Procedure: TRANSESOPHAGEAL ECHOCARDIOGRAM (TEE);  Surgeon: Christell Constant, MD;  Location: [facility] ENDOSCOPY;  Service: Cardiovascular;  Laterality: N/A;   TEE WITHOUT CARDIOVERSION N/A 03/30/2020   Procedure: TRANSESOPHAGEAL ECHOCARDIOGRAM (TEE);  Surgeon: Purcell Nails, MD;  Location: [facility] OR;  Service: Open  Heart Surgery;  Laterality: N/A;    Home Medications:  Allergies as of 09/16/2022       Reactions   Hydrocodone Nausea And Vomiting   Oxycodone Nausea And Vomiting        Medication List        Accurate as of Sep 16, 2022  4:24 PM. If you have any questions, ask your nurse or doctor.          STOP taking these medications    traMADol 50 MG tablet Commonly known as: ULTRAM Stopped by: Michiel Cowboy, PA-C       TAKE these medications    aspirin EC 81 MG tablet Take 81 mg by mouth daily.   aspirin-acetaminophen-caffeine 250-250-65 MG tablet Commonly known as: EXCEDRIN MIGRAINE Take 2 tablets by mouth every 6 (six) hours as needed for headache.   azelastine 0.1 % nasal spray Commonly known as: ASTELIN Place 1 spray into both nostrils 2 (two) times daily.   fluticasone 50 MCG/ACT nasal spray Commonly known as: FLONASE Place 2 sprays into both nostrils daily.   ibuprofen 200 MG tablet Commonly known  as: ADVIL Take 800 mg by mouth every 6 (six) hours as needed for headache or moderate pain.   montelukast 10 MG tablet Commonly known as: SINGULAIR Take 1 tablet (10 mg total) by mouth at bedtime.   rosuvastatin 40 MG tablet Commonly known as: CRESTOR Take 1 tablet (40 mg total) by mouth daily.   tamsulosin 0.4 MG Caps capsule Commonly known as: FLOMAX Taking one capsule by mouth as needed        Allergies:  Allergies  Allergen Reactions   Hydrocodone Nausea And Vomiting   Oxycodone Nausea And Vomiting    Family History: Family History  Problem Relation Age of Onset   Cancer Mother        breast, cause of death   Heart attack Maternal Grandfather 23       Cause of death   Hypertension Maternal Grandfather    Heart failure Brother    CVA Paternal Grandmother        cause of death   Atrial fibrillation Maternal Grandmother    Colon polyps Other        Fam Hx   Kidney cancer Neg Hx    Bladder Cancer Neg Hx    Prostate cancer Neg Hx      Social History:  reports that he has never smoked. He has never used smokeless tobacco. He reports current alcohol use of about 2.0 - 3.0 standard drinks of alcohol per week. He reports that he does not use drugs.  ROS: Pertinent ROS in HPI  Physical Exam: BP 132/70 (BP Location: Left Arm, Patient Position: Sitting, Cuff Size: Large)   Pulse 86   Ht 5\' 10"  (1.778 m)   Wt 195 lb 9.6 oz (88.7 kg)   BMI 28.07 kg/m   Constitutional:  Well nourished. Alert and oriented, No acute distress. HEENT: [place] AT, moist mucus membranes.  Trachea midline, no masses. Cardiovascular: No clubbing, cyanosis, or edema. Respiratory: Normal respiratory effort, no increased work of breathing. GI: Abdomen is soft, non tender, non distended, no abdominal masses. Liver and spleen not palpable.  No hernias appreciated.  Stool sample for occult testing is not indicated.   GU: No CVA tenderness.  No bladder fullness or masses.  Patient with circumcised/uncircumcised phallus. ***Foreskin easily retracted***  Urethral meatus is patent.  No penile discharge. No penile lesions or rashes. Scrotum without lesions, cysts, rashes and/or edema.  Testicles are located scrotally bilaterally. No masses are appreciated in the testicles. Left and right epididymis are normal. Rectal: Patient with  normal sphincter tone. Anus and perineum without scarring or rashes. No rectal masses are appreciated. Prostate is approximately *** grams, *** nodules are appreciated. Seminal vesicles are normal. Skin: No rashes, bruises or suspicious lesions. Lymph: No cervical or inguinal adenopathy. Neurologic: Grossly intact, no focal deficits, moving all 4 extremities. Psychiatric: Normal mood and affect.  Laboratory Data: Lab Results  Component Value Date   WBC 6.4 12/11/2021   HGB 13.9 12/11/2021   HCT 40.1 12/11/2021   MCV 89 12/11/2021   PLT 230 12/11/2021   Lab Results  Component Value Date   CREATININE 0.80 12/11/2021   Lab  Results  Component Value Date   TSH 1.030 12/11/2021      Component Value Date/Time   CHOL 146 12/11/2021 1118   HDL 55 12/11/2021 1118   CHOLHDL 2.7 12/11/2021 1118   CHOLHDL 3.6 03/27/2017 0841   LDLCALC 79 12/11/2021 1118   LDLCALC 123 (H) 03/27/2017 0841   Lab Results  Component Value Date   AST 25 12/11/2021   Lab Results  Component Value Date   ALT 28 12/11/2021   Urinalysis ***  I have reviewed the labs.   Pertinent Imaging: KUB *** I have independently reviewed the films.  See HPI.  Radiologist interpretation still pending.  Assessment & Plan:  ***  1.  Nephrolithiasis -UA *** -urine sent for culture -KUB ***    No follow-ups on file.  These notes generated with voice recognition software. I apologize for typographical errors.  Cloretta Ned  [facility] [address]  [address] [place], [place] [address] [phone]

## 2022-09-17 ENCOUNTER — Ambulatory Visit
Admission: RE | Admit: 2022-09-17 | Discharge: 2022-09-17 | Disposition: A | Discharge status: Home / Self Care | Payer: BC Managed Care – PPO | Source: Home / Self Care | Attending: Urology | Admitting: Urology

## 2022-09-17 ENCOUNTER — Other Ambulatory Visit: Payer: Self-pay | Admitting: Urology

## 2022-09-17 ENCOUNTER — Ambulatory Visit
Admission: RE | Admit: 2022-09-17 | Discharge: 2022-09-17 | Disposition: A | Discharge status: Home / Self Care | Payer: BC Managed Care – PPO | Source: Ambulatory Visit | Attending: Urology | Admitting: Urology

## 2022-09-17 DIAGNOSIS — N200 Calculus of kidney: Secondary | ICD-10-CM | POA: Insufficient documentation

## 2022-09-17 DIAGNOSIS — R3129 Other microscopic hematuria: Secondary | ICD-10-CM | POA: Insufficient documentation

## 2022-09-17 MED ORDER — SULFAMETHOXAZOLE-TRIMETHOPRIM 800-160 MG PO TABS
1.0000 | ORAL_TABLET | Freq: Two times a day (BID) | ORAL | 0 refills | Status: AC
Start: 2022-09-17 — End: ?

## 2022-09-17 NOTE — Progress Notes (Signed)
Carelink Summary Report / Loop Recorder 

## 2022-09-19 LAB — CUP PACEART REMOTE DEVICE CHECK
Date Time Interrogation Session: 20240522230156
Implantable Pulse Generator Implant Date: 20210512

## 2022-09-24 ENCOUNTER — Ambulatory Visit (INDEPENDENT_AMBULATORY_CARE_PROVIDER_SITE_OTHER): Payer: BC Managed Care – PPO

## 2022-09-24 DIAGNOSIS — I4819 Other persistent atrial fibrillation: Secondary | ICD-10-CM

## 2022-09-30 ENCOUNTER — Telehealth: Payer: Self-pay | Admitting: Family Medicine

## 2022-09-30 ENCOUNTER — Other Ambulatory Visit: Payer: Self-pay

## 2022-09-30 DIAGNOSIS — R3129 Other microscopic hematuria: Secondary | ICD-10-CM

## 2022-09-30 NOTE — Telephone Encounter (Signed)
-----   Message from Harle Battiest, PA-C sent at 09/30/2022  1:55 PM EDT ----- Would you see if Mr. Kouba is still having pain and if he wants to have surgery on his stones?

## 2022-09-30 NOTE — Telephone Encounter (Signed)
Spoke to patient and he states the stones are not bothering him and does not want to have surgery at this time. He said he was suppose to recheck urine a couple weeks after finishing ABX. Appointment made for UA.

## 2022-09-30 NOTE — Telephone Encounter (Signed)
-----   Message from Shannon A McGowan, PA-C sent at 09/30/2022  1:55 PM EDT ----- Would you see if Mr. Oviedo is still having pain and if he wants to have surgery on his stones? 

## 2022-10-01 ENCOUNTER — Other Ambulatory Visit: Payer: BC Managed Care – PPO

## 2022-10-01 DIAGNOSIS — R3129 Other microscopic hematuria: Secondary | ICD-10-CM

## 2022-10-01 LAB — URINALYSIS, COMPLETE
Bilirubin, UA: NEGATIVE
Glucose, UA: NEGATIVE
Ketones, UA: NEGATIVE
Leukocytes,UA: NEGATIVE
Nitrite, UA: NEGATIVE
Protein,UA: NEGATIVE
Specific Gravity, UA: <1.005 — ABNORMAL LOW (ref 1.005–1.030)
Urobilinogen, Ur: 0.2 mg/dL (ref 0.2–1.0)
pH, UA: 5 (ref 5.0–7.5)

## 2022-10-01 LAB — MICROSCOPIC EXAMINATION: Bacteria, UA: NONE SEEN

## 2022-10-17 NOTE — Progress Notes (Signed)
Carelink Summary Report / Loop Recorder 

## 2022-10-22 LAB — CUP PACEART REMOTE DEVICE CHECK
Date Time Interrogation Session: 20240624230153
Implantable Pulse Generator Implant Date: 20210512

## 2022-10-28 ENCOUNTER — Ambulatory Visit: Payer: BC Managed Care – PPO

## 2022-10-28 DIAGNOSIS — I4819 Other persistent atrial fibrillation: Secondary | ICD-10-CM

## 2022-11-13 NOTE — Progress Notes (Signed)
 Carelink Summary Report / Loop Recorder 

## 2022-11-25 ENCOUNTER — Ambulatory Visit: Payer: BC Managed Care – PPO

## 2022-11-25 DIAGNOSIS — I4819 Other persistent atrial fibrillation: Secondary | ICD-10-CM

## 2022-11-25 LAB — CUP PACEART REMOTE DEVICE CHECK
Date Time Interrogation Session: 20240727230141
Implantable Pulse Generator Implant Date: 20210512

## 2022-11-27 DIAGNOSIS — I4819 Other persistent atrial fibrillation: Secondary | ICD-10-CM | POA: Diagnosis not present

## 2022-12-02 ENCOUNTER — Ambulatory Visit: Payer: BC Managed Care – PPO

## 2022-12-10 NOTE — Progress Notes (Signed)
Carelink Summary Report / Loop Recorder 

## 2022-12-28 LAB — CUP PACEART REMOTE DEVICE CHECK
Date Time Interrogation Session: 20240829230427
Implantable Pulse Generator Implant Date: 20210512

## 2022-12-31 ENCOUNTER — Ambulatory Visit (INDEPENDENT_AMBULATORY_CARE_PROVIDER_SITE_OTHER): Payer: BC Managed Care – PPO

## 2022-12-31 DIAGNOSIS — I4819 Other persistent atrial fibrillation: Secondary | ICD-10-CM

## 2023-01-06 ENCOUNTER — Ambulatory Visit: Payer: BC Managed Care – PPO

## 2023-01-08 NOTE — Progress Notes (Signed)
Carelink Summary Report / Loop Recorder 

## 2023-01-15 ENCOUNTER — Ambulatory Visit: Payer: BC Managed Care – PPO

## 2023-01-15 DIAGNOSIS — Z23 Encounter for immunization: Secondary | ICD-10-CM | POA: Diagnosis not present

## 2023-01-21 ENCOUNTER — Ambulatory Visit: Payer: Self-pay | Admitting: Podiatry

## 2023-01-27 ENCOUNTER — Other Ambulatory Visit: Payer: Self-pay | Admitting: Orthopedic Surgery

## 2023-01-27 DIAGNOSIS — G8929 Other chronic pain: Secondary | ICD-10-CM

## 2023-01-27 DIAGNOSIS — M1611 Unilateral primary osteoarthritis, right hip: Secondary | ICD-10-CM

## 2023-01-29 LAB — CUP PACEART REMOTE DEVICE CHECK
Date Time Interrogation Session: 20241001230105
Implantable Pulse Generator Implant Date: 20210512

## 2023-02-03 ENCOUNTER — Ambulatory Visit (INDEPENDENT_AMBULATORY_CARE_PROVIDER_SITE_OTHER): Payer: BC Managed Care – PPO

## 2023-02-03 ENCOUNTER — Ambulatory Visit: Payer: BC Managed Care – PPO

## 2023-02-03 DIAGNOSIS — I4819 Other persistent atrial fibrillation: Secondary | ICD-10-CM

## 2023-02-10 ENCOUNTER — Ambulatory Visit: Payer: BC Managed Care – PPO

## 2023-02-14 NOTE — Progress Notes (Signed)
Carelink Summary Report / Loop Recorder 

## 2023-02-18 ENCOUNTER — Ambulatory Visit
Admission: RE | Admit: 2023-02-18 | Discharge: 2023-02-18 | Disposition: A | Discharge status: Home / Self Care | Payer: BC Managed Care – PPO | Source: Ambulatory Visit | Attending: Orthopedic Surgery | Admitting: Orthopedic Surgery

## 2023-02-18 DIAGNOSIS — M1611 Unilateral primary osteoarthritis, right hip: Secondary | ICD-10-CM

## 2023-02-18 DIAGNOSIS — G8929 Other chronic pain: Secondary | ICD-10-CM

## 2023-03-10 ENCOUNTER — Ambulatory Visit (INDEPENDENT_AMBULATORY_CARE_PROVIDER_SITE_OTHER): Payer: BC Managed Care – PPO

## 2023-03-10 ENCOUNTER — Ambulatory Visit: Payer: BC Managed Care – PPO

## 2023-03-10 DIAGNOSIS — I4819 Other persistent atrial fibrillation: Secondary | ICD-10-CM | POA: Diagnosis not present

## 2023-03-10 LAB — CUP PACEART REMOTE DEVICE CHECK
Date Time Interrogation Session: 20241110230121
Implantable Pulse Generator Implant Date: 20210512

## 2023-04-02 NOTE — Progress Notes (Signed)
Carelink Summary Report / Loop Recorder 

## 2023-04-14 ENCOUNTER — Ambulatory Visit (INDEPENDENT_AMBULATORY_CARE_PROVIDER_SITE_OTHER): Payer: BC Managed Care – PPO

## 2023-04-14 ENCOUNTER — Ambulatory Visit: Payer: BC Managed Care – PPO

## 2023-04-14 DIAGNOSIS — I4819 Other persistent atrial fibrillation: Secondary | ICD-10-CM | POA: Diagnosis not present

## 2023-04-14 LAB — CUP PACEART REMOTE DEVICE CHECK
Date Time Interrogation Session: 20241215230207
Implantable Pulse Generator Implant Date: 20210512

## 2023-05-19 ENCOUNTER — Ambulatory Visit: Payer: 59

## 2023-05-19 ENCOUNTER — Ambulatory Visit (INDEPENDENT_AMBULATORY_CARE_PROVIDER_SITE_OTHER): Payer: 59

## 2023-05-19 DIAGNOSIS — I480 Paroxysmal atrial fibrillation: Secondary | ICD-10-CM

## 2023-05-19 LAB — CUP PACEART REMOTE DEVICE CHECK
Date Time Interrogation Session: 20250119230339
Implantable Pulse Generator Implant Date: 20210512

## 2023-05-19 NOTE — Progress Notes (Signed)
Carelink Summary Report / Loop Recorder 

## 2023-06-23 ENCOUNTER — Ambulatory Visit (INDEPENDENT_AMBULATORY_CARE_PROVIDER_SITE_OTHER): Payer: BC Managed Care – PPO

## 2023-06-23 ENCOUNTER — Ambulatory Visit: Payer: BC Managed Care – PPO

## 2023-06-23 DIAGNOSIS — I480 Paroxysmal atrial fibrillation: Secondary | ICD-10-CM

## 2023-06-24 LAB — CUP PACEART REMOTE DEVICE CHECK
Date Time Interrogation Session: 20250223230104
Implantable Pulse Generator Implant Date: 20210512

## 2023-06-25 NOTE — Progress Notes (Signed)
 Carelink Summary Report / Loop Recorder

## 2023-07-16 NOTE — Progress Notes (Unsigned)
 No chief complaint on file.  History of Present Illness: 58 yo male with history of persistent atrial fibrillation, HTN, hyperlipidemia, sleep apnea and CAD here today for cardiac follow up. He began having chest pain and dyspnea in the summer of 2019. Nuclear stress test was low risk. He continued to have symptoms and Dr. Johney Frame referred him for a cardiac cath on 12/23/17. Cardiac cath with severe stenosis in the mid LAD treated with a drug eluting stent. There was mild disease in the RCA and moderate non-obstructive disease in the Circumflex. He reported dyspnea in 2020 and had a normal exercise stress test at that time. Cardiac cath in October 2021 with patent LAD stent and mild to moderate disease in the Circumflex and posterolateral artery that was unchanged from prior cath and not felt to be flow limiting. His atrial fibrillation is followed by Dr. Johney Frame. He has had 3 atrial fib ablations and had a Maze procedure and left atrial appendage clip in 2021. Anti-coagulation has been stopped. A loop recorder is in place. Normal exercise stress test in June 2023. Echo June 2023 with LVEF=60-65%. Mild LVH. Normal RV function. Mild AI.   He is here today for follow up. The patient denies any chest pain, dyspnea, palpitations, lower extremity edema, orthopnea, PND, dizziness, near syncope or syncope.   He is a retired Chartered loss adjuster.    Primary Care Physician: Bosie Clos, MD  Past Medical History:  Diagnosis Date   Allergic rhinitis, seasonal    Anticoagulant long-term use managed by cardiology   asa and xarelto   Bilateral nephrolithiasis    urologist--- dr Mena Goes ([place] office)   Coronary artery disease cardiologist--- dr Clifton James   nuclear stress test 10-18-2016 w/ normal perfusion;  cardiac cath 12-23-2017, PCI w/ DES to midLAD and mild RCA/ moderate CFx diease nonobstructive , normal LVF;  ETT 07-13-2019 negative for ischemia   Dysrhythmia    Afib   History of kidney stones     Hyperlipidemia    Hypertension    followed by pcp and cardiology   OA (osteoarthritis)    knees   OSA on CPAP    12-31-2019  per pt uses cpap every night   PAF (paroxysmal atrial fibrillation) (HCC) 2011   followed by dr Johney Frame (ep)---  first dx 2011;  s/p DCCV's and ablation x2 in 2015 and last one 03/ 2018, and s/p loop recorder 05/ 2021;   failed multaq, flecainide, and amiodarone   Pneumonia    Right knee meniscal tear    S/P ablation of atrial fibrillation followed by dr Johney Frame   x3  last one 2018   S/P drug eluting coronary stent placement 12/23/2017   x1  to midLAD   S/P minimally-invasive maze operation for atrial fibrillation 03/30/2020   Complete bilateral atrial lesion set using cryothermy and bipolar radiofrequency ablation with clipping of LA appendage via right mini thoracotomy approach   SOB (shortness of breath)    12-31-2019  per pt only when he has atrial fib , otherwise no issues with sob with activities   Status post placement of implantable loop recorder 09/08/2019   followed by dr Johney Frame    Past Surgical History:  Procedure Laterality Date   APPENDECTOMY  1975   ATRIAL FIBRILLATION ABLATION N/A 11/02/2013   PVI by Dr Johney Frame   ATRIAL FIBRILLATION ABLATION N/A 03/29/2014   PVI and CTI by Dr Johney Frame   ATRIAL FIBRILLATION ABLATION N/A 07/04/2016   Procedure: Atrial Fibrillation Ablation;  Surgeon: Hillis Range, MD;  Location[facility] INVASIVE CV LAB;  Service: Cardiovascular;  Laterality: N/A;   CARDIOVERSION N/A 10/15/2013   Procedure: CARDIOVERSION;  Surgeon: Pricilla Riffle, MD;  Location: [facility] ENDOSCOPY;  Service: Cardiovascular;  Laterality: N/A;   CLIPPING OF ATRIAL APPENDAGE  03/30/2020   Procedure: CLIPPING OF ATRIAL APPENDAGE USING ATRICURE ATRICLIP [id number];  Surgeon: Purcell Nails, MD;  Location: [facility] OR;  Service: Open Heart Surgery;;   COLONOSCOPY  2016   CORONARY STENT INTERVENTION N/A 12/23/2017   Procedure: CORONARY STENT INTERVENTION;  Surgeon: Kathleene Hazel, MD;  Location: [facility] INVASIVE CV LAB;  Service: Cardiovascular;  Laterality: N/A;   EXTRACORPOREAL SHOCK WAVE LITHOTRIPSY  2005;  2013   implantable loop recorder placement  09/08/2019   (done in office)   Medtronic Reveal Linq model LNQ 22 (SN [id number]) implantable loop recorder implanted in office by Dr Johney Frame for afib management   INGUINAL HERNIA REPAIR Left 05/31/2015   Procedure: HERNIA REPAIR INGUINAL ADULT;  Surgeon: Earline Mayotte, MD;  Location: [facility] ORS;  Service: General;  Laterality: Left;   KNEE ARTHROSCOPY Left 2009   @[facility]    KNEE ARTHROSCOPY WITH MEDIAL MENISECTOMY Right 01/04/2020   Procedure: KNEE ARTHROSCOPY WITH PARTIAL MEDIAL MENISECTOMY;  Surgeon: Yolonda Kida, MD;  Location[facility];  Service: Orthopedics;  Laterality: Right;   MINIMALLY INVASIVE MAZE PROCEDURE N/A 03/30/2020   Procedure: MINIMALLY INVASIVE MAZE PROCEDURE;  Surgeon: Purcell Nails, MD;  Location: [facility] OR;  Service: Open Heart Surgery;  Laterality: N/A;   RHINOPLASTY  2006    @[facility]    RIGHT/LEFT HEART CATH AND CORONARY ANGIOGRAPHY N/A 12/23/2017   Procedure: RIGHT/LEFT HEART CATH AND CORONARY ANGIOGRAPHY;  Surgeon: Kathleene Hazel, MD;  [facility]: [facility] INVASIVE CV LAB;  Service: Cardiovascular;  Laterality: N/A;   RIGHT/LEFT HEART CATH AND CORONARY ANGIOGRAPHY N/A 02/02/2020   Procedure: RIGHT/LEFT HEART CATH AND CORONARY ANGIOGRAPHY;  Surgeon: Kathleene Hazel, MD;  [facility]: [facility] INVASIVE CV LAB;  Service: Cardiovascular;  Laterality: N/A;   ROBOT ASSISTED INGUINAL HERNIA REPAIR Left 05-01-2013   @[facility]    TEE WITHOUT CARDIOVERSION N/A 11/01/2013   Procedure: TRANSESOPHAGEAL ECHOCARDIOGRAM (TEE);  Surgeon: Donato Schultz, MD;  Location: [facility] ENDOSCOPY;  Service: Cardiovascular;  Laterality: N/A;   TEE WITHOUT CARDIOVERSION N/A 03/28/2014   Procedure: TRANSESOPHAGEAL ECHOCARDIOGRAM (TEE);  Surgeon: Laurey Morale, MD;  Location: [facility] ENDOSCOPY;  Service: Cardiovascular;   Laterality: N/A;   TEE WITHOUT CARDIOVERSION N/A 02/02/2020   Procedure: TRANSESOPHAGEAL ECHOCARDIOGRAM (TEE);  Surgeon: Christell Constant, MD;  Location: [facility] ENDOSCOPY;  Service: Cardiovascular;  Laterality: N/A;   TEE WITHOUT CARDIOVERSION N/A 03/30/2020   Procedure: TRANSESOPHAGEAL ECHOCARDIOGRAM (TEE);  Surgeon: Purcell Nails, MD;  Location: [facility] OR;  Service: Open Heart Surgery;  Laterality: N/A;    Current Outpatient Medications  Medication Sig Dispense Refill   sulfamethoxazole-trimethoprim (BACTRIM DS) 800-160 MG tablet Take 1 tablet by mouth every 12 (twelve) hours. 14 tablet 0   aspirin 81 MG EC tablet Take 81 mg by mouth daily.      aspirin-acetaminophen-caffeine (EXCEDRIN MIGRAINE) 250-250-65 MG tablet Take 2 tablets by mouth every 6 (six) hours as needed for headache.      azelastine (ASTELIN) 0.1 % nasal spray Place 1 spray into both nostrils 2 (two) times daily. 30 mL 12   fluticasone (FLONASE) 50 MCG/ACT nasal spray Place 2 sprays into both nostrils daily. 16 g 0   ibuprofen (ADVIL) 200 MG tablet Take 800 mg by mouth  every 6 (six) hours as needed for headache or moderate pain.     montelukast (SINGULAIR) 10 MG tablet Take 1 tablet (10 mg total) by mouth at bedtime. 90 tablet 3   rosuvastatin (CRESTOR) 40 MG tablet Take 1 tablet (40 mg total) by mouth daily. 90 tablet 0   tamsulosin (FLOMAX) 0.4 MG CAPS capsule Taking one capsule by mouth as needed     No current facility-administered medications for this visit.    Allergies  Allergen Reactions   Hydrocodone Nausea And Vomiting   Oxycodone Nausea And Vomiting    Social History   Socioeconomic History   Marital status: Married    Spouse name: Not on file   Number of children: Not on file   Years of education: Not on file   Highest education level: Not on file  Occupational History   Occupation: International aid/development worker w/ [employer]  Tobacco Use   Smoking status: Never   Smokeless tobacco: Never  Vaping Use    Vaping status: Never Used  Substance and Sexual Activity   Alcohol use: Yes    Alcohol/week: 2.0 - 3.0 standard drinks of alcohol    Types: 2 - 3 Cans of beer per week    Comment: occasionally   Drug use: Never   Sexual activity: Yes  Other Topics Concern   Not on file  Social History Narrative   Lives in [place] with his spouse.  Works as Engineer, technical sales of the [employer]   Social Drivers of [employer]: Low Risk  (12/17/2022)   Received from [facility]   Overall Financial Resource Strain (CARDIA)    Difficulty of Paying Living Expenses: Not hard at all  Food Insecurity: No Food Insecurity (12/17/2022)   Received from [facility]   Hunger Vital Sign    Worried About Running Out of Food in the Last Year: Never true    Ran Out of Food in the Last Year: Never true  Transportation Needs: No Transportation Needs (12/17/2022)   [facility]    In the past 12 months, has lack of transportation kept you from medical appointments or from getting medications?: No    Lack of Transportation (Non-Medical): No  Physical Activity: Sufficiently Active (12/17/2022)   [facility] from [facility]   Exercise Vital Sign    Days of Exercise per Week: 4 days    Minutes of Exercise per Session: 50 min  Stress: Not on file  Social Connections: Not on file  Intimate Partner Violence: Not on file    Family History  Problem Relation Age of Onset   Cancer Mother        breast, cause of death   Heart attack Maternal Grandfather 74       Cause of death   Hypertension Maternal Grandfather    Heart failure Brother    CVA Paternal Grandmother        cause of death   Atrial fibrillation Maternal Grandmother    Colon polyps Other        Fam Hx   Kidney cancer Neg Hx    Bladder Cancer Neg Hx    Prostate cancer Neg Hx     Review of Systems:  As stated in  the HPI and otherwise negative.   There were no vitals taken for this visit.  Physical Examination: General: Well developed, well nourished, NAD  HEENT: OP clear, mucus membranes moist  SKIN: warm, dry. No rashes. Neuro: No focal deficits  Musculoskeletal: Muscle strength 5/5 all ext  Psychiatric: Mood and affect normal  Neck: No JVD, no carotid bruits, no thyromegaly, no lymphadenopathy.  Lungs:Clear bilaterally, no wheezes, rhonci, crackles Cardiovascular: Regular rate and rhythm. No murmurs, gallops or rubs. Abdomen:Soft. Bowel sounds present. Non-tender.  Extremities: No lower extremity edema. Pulses are 2 + in the bilateral DP/PT.  EKG:  EKG is *** ordered today. The ekg ordered today demonstrates    Recent Labs: No results found for requested labs within last 365 days.   Lipid Panel    Component Value Date/Time   CHOL 146 12/11/2021 1118   TRIG 60 12/11/2021 1118   HDL 55 12/11/2021 1118   CHOLHDL 2.7 12/11/2021 1118   CHOLHDL 3.6 03/27/2017 0841   LDLCALC 79 12/11/2021 1118   LDLCALC 123 (H) 03/27/2017 0841    Wt Readings from Last 3 Encounters:  09/16/22 88.7 kg  12/11/21 86.6 kg  09/28/21 88.5 kg    Assessment and Plan:   1. CAD without angina: No chest pain suggestive of angina. Normal stress test in 2023. Continue ASA and Crestor.   2. Atrial fibrillation, paroxysmal: Sinus today. No long on anti-coagulation post ablation. Loop recorder in place.    3. HTN: BP is within a normal range.   4. Aortic insufficiency: Mild by echo in June 2023.   Labs/ tests ordered today include:  No orders of the defined types were placed in this encounter.  Disposition:   F/U with me in 12 months  Signed, Verne Carrow, MD 07/16/2023 1:11 PM    [facility] [address] [place], [place], [place]  [address] Phone: [phone]; Fax: [phone]

## 2023-07-17 ENCOUNTER — Ambulatory Visit: Attending: Cardiovascular Disease | Admitting: Cardiovascular Disease

## 2023-07-17 ENCOUNTER — Encounter: Payer: Self-pay | Admitting: Cardiovascular Disease

## 2023-07-17 VITALS — BP 148/90 | HR 81 | Ht 70.0 in | Wt 201.6 lb

## 2023-07-17 DIAGNOSIS — I2510 Atherosclerotic heart disease of native coronary artery without angina pectoris: Secondary | ICD-10-CM

## 2023-07-17 DIAGNOSIS — I480 Paroxysmal atrial fibrillation: Secondary | ICD-10-CM

## 2023-07-17 DIAGNOSIS — I351 Nonrheumatic aortic (valve) insufficiency: Secondary | ICD-10-CM

## 2023-07-17 DIAGNOSIS — I10 Essential (primary) hypertension: Secondary | ICD-10-CM

## 2023-07-17 DIAGNOSIS — E7800 Pure hypercholesterolemia, unspecified: Secondary | ICD-10-CM

## 2023-07-17 MED ORDER — EZETIMIBE 10 MG PO TABS: 10.0000 mg | ORAL_TABLET | Freq: Every day | ORAL | 3 refills | Status: AC

## 2023-07-17 NOTE — Patient Instructions (Signed)
Medication Instructions:  No changes *If you need a refill on your cardiac medications before your next appointment, please call your pharmacy*   Lab Work: none If you have labs (blood work) drawn today and your tests are completely normal, you will receive your results only by: [place] (if you have MyChart) OR A paper copy in the mail If you have any lab test that is abnormal or we need to change your treatment, we will call you to review the results.   Testing/Procedures: none   Follow-Up: At [facility], you and your health needs are our priority.  As part of our continuing mission to provide you with exceptional heart care, we have created designated Provider Care Teams.  These Care Teams include your primary Cardiologist (physician) and Advanced Practice Providers (APPs -  Physician Assistants and Nurse Practitioners) who all work together to provide you with the care you need, when you need it.   Your next appointment:   12 month(s)  Provider:   Lauree Chandler, MD     Other Instructions

## 2023-07-28 ENCOUNTER — Ambulatory Visit (INDEPENDENT_AMBULATORY_CARE_PROVIDER_SITE_OTHER): Payer: BC Managed Care – PPO

## 2023-07-28 ENCOUNTER — Ambulatory Visit: Payer: BC Managed Care – PPO

## 2023-07-28 DIAGNOSIS — I4819 Other persistent atrial fibrillation: Secondary | ICD-10-CM

## 2023-07-29 LAB — CUP PACEART REMOTE DEVICE CHECK
Date Time Interrogation Session: 20250330230204
Implantable Pulse Generator Implant Date: 20210512

## 2023-07-29 NOTE — Progress Notes (Signed)
 Carelink Summary Report / Loop Recorder

## 2023-07-29 NOTE — Addendum Note (Signed)
 Addended by: Geralyn Flash D on: 07/29/2023 12:40 PM   Modules accepted: Orders

## 2023-09-01 ENCOUNTER — Ambulatory Visit (INDEPENDENT_AMBULATORY_CARE_PROVIDER_SITE_OTHER): Payer: BC Managed Care – PPO

## 2023-09-01 ENCOUNTER — Ambulatory Visit: Payer: BC Managed Care – PPO

## 2023-09-01 DIAGNOSIS — I4819 Other persistent atrial fibrillation: Secondary | ICD-10-CM

## 2023-09-01 LAB — CUP PACEART REMOTE DEVICE CHECK
Date Time Interrogation Session: 20250504230444
Implantable Pulse Generator Implant Date: 20210512

## 2023-09-10 NOTE — Progress Notes (Signed)
 Carelink Summary Report / Loop Recorder

## 2023-09-10 NOTE — Addendum Note (Signed)
 Addended by: Edra Govern D on: 09/10/2023 11:43 AM   Modules accepted: Orders

## 2023-09-11 ENCOUNTER — Ambulatory Visit: Payer: 59 | Admitting: Cardiovascular Disease

## 2023-09-14 ENCOUNTER — Ambulatory Visit: Payer: Self-pay | Admitting: Cardiology

## 2023-10-02 ENCOUNTER — Ambulatory Visit (INDEPENDENT_AMBULATORY_CARE_PROVIDER_SITE_OTHER)

## 2023-10-02 DIAGNOSIS — I4819 Other persistent atrial fibrillation: Secondary | ICD-10-CM

## 2023-10-02 LAB — CUP PACEART REMOTE DEVICE CHECK
Date Time Interrogation Session: 20250604231427
Implantable Pulse Generator Implant Date: 20210512

## 2023-10-06 ENCOUNTER — Ambulatory Visit: Payer: BC Managed Care – PPO

## 2023-10-13 ENCOUNTER — Ambulatory Visit: Payer: Self-pay | Admitting: Cardiology

## 2023-10-17 NOTE — Progress Notes (Signed)
 Carelink Summary Report / Loop Recorder

## 2023-11-03 ENCOUNTER — Ambulatory Visit (INDEPENDENT_AMBULATORY_CARE_PROVIDER_SITE_OTHER)

## 2023-11-03 DIAGNOSIS — I4819 Other persistent atrial fibrillation: Secondary | ICD-10-CM

## 2023-11-03 LAB — CUP PACEART REMOTE DEVICE CHECK
Date Time Interrogation Session: 20250705230815
Implantable Pulse Generator Implant Date: 20210512

## 2023-11-10 ENCOUNTER — Ambulatory Visit: Payer: BC Managed Care – PPO

## 2023-11-18 NOTE — Progress Notes (Signed)
 Carelink Summary Report / Loop Recorder

## 2023-11-29 ENCOUNTER — Ambulatory Visit: Payer: Self-pay | Admitting: Cardiology

## 2023-12-04 ENCOUNTER — Ambulatory Visit: Payer: Self-pay | Admitting: Cardiology

## 2023-12-04 ENCOUNTER — Ambulatory Visit (INDEPENDENT_AMBULATORY_CARE_PROVIDER_SITE_OTHER)

## 2023-12-04 DIAGNOSIS — I4819 Other persistent atrial fibrillation: Secondary | ICD-10-CM

## 2023-12-04 LAB — CUP PACEART REMOTE DEVICE CHECK
Date Time Interrogation Session: 20250805230220
Implantable Pulse Generator Implant Date: 20210512

## 2023-12-15 ENCOUNTER — Ambulatory Visit: Payer: BC Managed Care – PPO

## 2024-01-05 ENCOUNTER — Ambulatory Visit (INDEPENDENT_AMBULATORY_CARE_PROVIDER_SITE_OTHER)

## 2024-01-05 DIAGNOSIS — I4819 Other persistent atrial fibrillation: Secondary | ICD-10-CM | POA: Diagnosis not present

## 2024-01-05 LAB — CUP PACEART REMOTE DEVICE CHECK
Date Time Interrogation Session: 20250907231135
Implantable Pulse Generator Implant Date: 20210512

## 2024-01-06 ENCOUNTER — Ambulatory Visit: Payer: Self-pay | Admitting: Cardiology

## 2024-01-15 NOTE — Progress Notes (Signed)
 Remote Loop Recorder Transmission

## 2024-01-19 ENCOUNTER — Ambulatory Visit: Payer: BC Managed Care – PPO

## 2024-01-23 NOTE — Progress Notes (Signed)
 Remote Loop Recorder Transmission

## 2024-02-04 ENCOUNTER — Ambulatory Visit (INDEPENDENT_AMBULATORY_CARE_PROVIDER_SITE_OTHER)

## 2024-02-04 DIAGNOSIS — I4819 Other persistent atrial fibrillation: Secondary | ICD-10-CM | POA: Diagnosis not present

## 2024-02-05 ENCOUNTER — Ambulatory Visit

## 2024-02-05 LAB — CUP PACEART REMOTE DEVICE CHECK
Date Time Interrogation Session: 20251007230400
Implantable Pulse Generator Implant Date: 20210512

## 2024-02-06 ENCOUNTER — Ambulatory Visit: Payer: Self-pay | Admitting: Cardiology

## 2024-02-06 NOTE — Progress Notes (Signed)
 Remote Loop Recorder Transmission

## 2024-02-09 NOTE — Progress Notes (Signed)
 Remote Loop Recorder Transmission

## 2024-02-23 ENCOUNTER — Ambulatory Visit: Payer: BC Managed Care – PPO

## 2024-03-06 ENCOUNTER — Ambulatory Visit

## 2024-03-07 ENCOUNTER — Ambulatory Visit

## 2024-03-07 DIAGNOSIS — I4819 Other persistent atrial fibrillation: Secondary | ICD-10-CM | POA: Diagnosis not present

## 2024-03-07 LAB — CUP PACEART REMOTE DEVICE CHECK
Date Time Interrogation Session: 20251108230035
Implantable Pulse Generator Implant Date: 20210512

## 2024-03-08 ENCOUNTER — Ambulatory Visit

## 2024-03-09 ENCOUNTER — Ambulatory Visit: Payer: Self-pay | Admitting: Cardiology

## 2024-03-10 NOTE — Progress Notes (Signed)
 Remote Loop Recorder Transmission

## 2024-03-29 ENCOUNTER — Ambulatory Visit: Payer: BC Managed Care – PPO

## 2024-04-06 ENCOUNTER — Ambulatory Visit

## 2024-04-07 ENCOUNTER — Ambulatory Visit

## 2024-04-08 ENCOUNTER — Ambulatory Visit: Payer: Self-pay | Admitting: Cardiology

## 2024-04-08 ENCOUNTER — Ambulatory Visit

## 2024-04-08 LAB — CUP PACEART REMOTE DEVICE CHECK
Date Time Interrogation Session: 20251209230726
Implantable Pulse Generator Implant Date: 20210512

## 2024-04-14 NOTE — Progress Notes (Signed)
 Remote Loop Recorder Transmission

## 2024-05-03 ENCOUNTER — Ambulatory Visit: Payer: BC Managed Care – PPO

## 2024-05-07 ENCOUNTER — Ambulatory Visit

## 2024-05-08 ENCOUNTER — Ambulatory Visit

## 2024-05-08 DIAGNOSIS — I4819 Other persistent atrial fibrillation: Secondary | ICD-10-CM

## 2024-05-10 ENCOUNTER — Ambulatory Visit

## 2024-05-10 LAB — CUP PACEART REMOTE DEVICE CHECK
Date Time Interrogation Session: 20260109230349
Implantable Pulse Generator Implant Date: 20210512

## 2024-05-11 ENCOUNTER — Ambulatory Visit: Payer: Self-pay | Admitting: Cardiology

## 2024-05-11 NOTE — Progress Notes (Signed)
 Remote Loop Recorder Transmission

## 2024-06-07 ENCOUNTER — Ambulatory Visit: Payer: BC Managed Care – PPO

## 2024-06-07 ENCOUNTER — Ambulatory Visit

## 2024-06-08 ENCOUNTER — Ambulatory Visit

## 2024-06-10 ENCOUNTER — Ambulatory Visit

## 2024-07-08 ENCOUNTER — Ambulatory Visit

## 2024-07-09 ENCOUNTER — Ambulatory Visit

## 2024-07-12 ENCOUNTER — Ambulatory Visit: Payer: BC Managed Care – PPO

## 2024-07-12 ENCOUNTER — Ambulatory Visit

## 2024-08-08 ENCOUNTER — Ambulatory Visit

## 2024-08-09 ENCOUNTER — Ambulatory Visit

## 2024-08-12 ENCOUNTER — Ambulatory Visit

## 2024-08-16 ENCOUNTER — Ambulatory Visit: Payer: BC Managed Care – PPO

## 2024-09-08 ENCOUNTER — Ambulatory Visit

## 2024-09-09 ENCOUNTER — Ambulatory Visit

## 2024-09-13 ENCOUNTER — Ambulatory Visit

## 2024-10-25 ENCOUNTER — Ambulatory Visit: Payer: BC Managed Care – PPO

## 2024-11-29 ENCOUNTER — Ambulatory Visit: Payer: BC Managed Care – PPO

## 2025-01-03 ENCOUNTER — Ambulatory Visit: Payer: BC Managed Care – PPO

## 2025-02-07 ENCOUNTER — Ambulatory Visit: Payer: BC Managed Care – PPO
